# Patient Record
Sex: Female | Born: 1945 | Race: White | Hispanic: No | Marital: Married | Smoking: Never smoker
Health system: Southern US, Community
[De-identification: ages and names within clinical notes are randomized; demographics above are authoritative.]

## PROBLEM LIST (undated history)

## (undated) DIAGNOSIS — F329 Major depressive disorder, single episode, unspecified: Secondary | ICD-10-CM

## (undated) DIAGNOSIS — E039 Hypothyroidism, unspecified: Secondary | ICD-10-CM

## (undated) DIAGNOSIS — K2950 Unspecified chronic gastritis without bleeding: Secondary | ICD-10-CM

## (undated) DIAGNOSIS — M47812 Spondylosis without myelopathy or radiculopathy, cervical region: Secondary | ICD-10-CM

## (undated) DIAGNOSIS — M545 Low back pain: Secondary | ICD-10-CM

## (undated) DIAGNOSIS — H269 Unspecified cataract: Secondary | ICD-10-CM

## (undated) DIAGNOSIS — K5790 Diverticulosis of intestine, part unspecified, without perforation or abscess without bleeding: Secondary | ICD-10-CM

## (undated) DIAGNOSIS — M899 Disorder of bone, unspecified: Secondary | ICD-10-CM

## (undated) DIAGNOSIS — K589 Irritable bowel syndrome without diarrhea: Secondary | ICD-10-CM

## (undated) DIAGNOSIS — M7061 Trochanteric bursitis, right hip: Secondary | ICD-10-CM

## (undated) DIAGNOSIS — E7800 Pure hypercholesterolemia, unspecified: Secondary | ICD-10-CM

## (undated) DIAGNOSIS — R609 Edema, unspecified: Secondary | ICD-10-CM

## (undated) DIAGNOSIS — R600 Localized edema: Secondary | ICD-10-CM

## (undated) DIAGNOSIS — D649 Anemia, unspecified: Secondary | ICD-10-CM

## (undated) DIAGNOSIS — M62838 Other muscle spasm: Secondary | ICD-10-CM

## (undated) DIAGNOSIS — M949 Disorder of cartilage, unspecified: Secondary | ICD-10-CM

## (undated) DIAGNOSIS — T7840XA Allergy, unspecified, initial encounter: Secondary | ICD-10-CM

## (undated) DIAGNOSIS — R011 Cardiac murmur, unspecified: Secondary | ICD-10-CM

## (undated) DIAGNOSIS — N281 Cyst of kidney, acquired: Secondary | ICD-10-CM

## (undated) DIAGNOSIS — Z5189 Encounter for other specified aftercare: Secondary | ICD-10-CM

## (undated) DIAGNOSIS — G4700 Insomnia, unspecified: Secondary | ICD-10-CM

## (undated) DIAGNOSIS — K219 Gastro-esophageal reflux disease without esophagitis: Secondary | ICD-10-CM

## (undated) DIAGNOSIS — G8929 Other chronic pain: Secondary | ICD-10-CM

## (undated) DIAGNOSIS — K449 Diaphragmatic hernia without obstruction or gangrene: Secondary | ICD-10-CM

## (undated) DIAGNOSIS — K7689 Other specified diseases of liver: Secondary | ICD-10-CM

## (undated) DIAGNOSIS — K52832 Lymphocytic colitis: Secondary | ICD-10-CM

## (undated) DIAGNOSIS — M1990 Unspecified osteoarthritis, unspecified site: Secondary | ICD-10-CM

## (undated) DIAGNOSIS — J309 Allergic rhinitis, unspecified: Secondary | ICD-10-CM

## (undated) DIAGNOSIS — N183 Chronic kidney disease, stage 3 unspecified: Secondary | ICD-10-CM

## (undated) DIAGNOSIS — J3489 Other specified disorders of nose and nasal sinuses: Secondary | ICD-10-CM

## (undated) HISTORY — DX: Chronic kidney disease, stage 3 unspecified: N18.30

## (undated) HISTORY — DX: Other chronic pain: G89.29

## (undated) HISTORY — DX: Localized edema: R60.0

## (undated) HISTORY — DX: Encounter for other specified aftercare: Z51.89

## (undated) HISTORY — DX: Unspecified chronic gastritis without bleeding: K29.50

## (undated) HISTORY — DX: Other specified disorders of nose and nasal sinuses: J34.89

## (undated) HISTORY — PX: ESOPHAGOGASTRODUODENOSCOPY: SHX1529

## (undated) HISTORY — DX: Irritable bowel syndrome, unspecified: K58.9

## (undated) HISTORY — DX: Insomnia, unspecified: G47.00

## (undated) HISTORY — DX: Anemia, unspecified: D64.9

## (undated) HISTORY — DX: Major depressive disorder, single episode, unspecified: F32.9

## (undated) HISTORY — DX: Lymphocytic colitis: K52.832

## (undated) HISTORY — DX: Other specified diseases of liver: K76.89

## (undated) HISTORY — PX: ABDOMINAL HYSTERECTOMY: SHX81

## (undated) HISTORY — DX: Pure hypercholesterolemia, unspecified: E78.00

## (undated) HISTORY — PX: COLONOSCOPY: SHX174

## (undated) HISTORY — DX: Hypothyroidism, unspecified: E03.9

## (undated) HISTORY — PX: SHOULDER SURGERY: SHX246

## (undated) HISTORY — DX: Allergic rhinitis, unspecified: J30.9

## (undated) HISTORY — DX: Diaphragmatic hernia without obstruction or gangrene: K44.9

## (undated) HISTORY — DX: Trochanteric bursitis, right hip: M70.61

## (undated) HISTORY — DX: Edema, unspecified: R60.9

## (undated) HISTORY — DX: Diverticulosis of intestine, part unspecified, without perforation or abscess without bleeding: K57.90

## (undated) HISTORY — DX: Unspecified cataract: H26.9

## (undated) HISTORY — DX: Disorder of bone, unspecified: M89.9

## (undated) HISTORY — DX: Cardiac murmur, unspecified: R01.1

## (undated) HISTORY — DX: Cyst of kidney, acquired: N28.1

## (undated) HISTORY — DX: Low back pain: M54.5

## (undated) HISTORY — DX: Other muscle spasm: M62.838

## (undated) HISTORY — DX: Spondylosis without myelopathy or radiculopathy, cervical region: M47.812

## (undated) HISTORY — DX: Disorder of cartilage, unspecified: M94.9

## (undated) HISTORY — DX: Allergy, unspecified, initial encounter: T78.40XA

## (undated) HISTORY — DX: Gastro-esophageal reflux disease without esophagitis: K21.9

## (undated) HISTORY — PX: CATARACT EXTRACTION: SUR2

## (undated) HISTORY — PX: OTHER SURGICAL HISTORY: SHX169

## (undated) HISTORY — PX: BACK SURGERY: SHX140

---

## 1998-08-07 ENCOUNTER — Encounter: Admission: RE | Admit: 1998-08-07 | Discharge: 1998-11-05 | Payer: Self-pay | Admitting: Specialist

## 1998-09-25 ENCOUNTER — Encounter
Admission: RE | Admit: 1998-09-25 | Discharge: 1998-12-24 | Payer: Self-pay | Admitting: Physical Medicine and Rehabilitation

## 1998-10-29 ENCOUNTER — Other Ambulatory Visit: Admission: RE | Admit: 1998-10-29 | Discharge: 1998-10-29 | Payer: Self-pay | Admitting: *Deleted

## 2000-01-27 ENCOUNTER — Emergency Department (HOSPITAL_COMMUNITY): Admission: EM | Admit: 2000-01-27 | Discharge: 2000-01-27 | Payer: Self-pay | Admitting: Emergency Medicine

## 2001-12-15 ENCOUNTER — Encounter: Admission: RE | Admit: 2001-12-15 | Discharge: 2001-12-15 | Payer: Self-pay | Admitting: *Deleted

## 2002-08-01 ENCOUNTER — Encounter: Payer: Self-pay | Admitting: Family Medicine

## 2002-08-01 ENCOUNTER — Encounter: Admission: RE | Admit: 2002-08-01 | Discharge: 2002-08-01 | Payer: Self-pay | Admitting: Family Medicine

## 2003-07-26 ENCOUNTER — Encounter: Payer: Self-pay | Admitting: Specialist

## 2003-07-26 ENCOUNTER — Encounter: Admission: RE | Admit: 2003-07-26 | Discharge: 2003-07-26 | Payer: Self-pay | Admitting: Specialist

## 2003-08-24 ENCOUNTER — Encounter: Admission: RE | Admit: 2003-08-24 | Discharge: 2003-08-24 | Payer: Self-pay | Admitting: Specialist

## 2003-08-24 ENCOUNTER — Encounter: Payer: Self-pay | Admitting: Specialist

## 2003-10-12 ENCOUNTER — Encounter: Payer: Self-pay | Admitting: Specialist

## 2003-10-18 ENCOUNTER — Inpatient Hospital Stay (HOSPITAL_COMMUNITY): Admission: RE | Admit: 2003-10-18 | Discharge: 2003-10-22 | Payer: Self-pay | Admitting: Specialist

## 2003-10-18 ENCOUNTER — Encounter: Payer: Self-pay | Admitting: Specialist

## 2005-10-13 ENCOUNTER — Encounter: Admission: RE | Admit: 2005-10-13 | Discharge: 2005-10-13 | Payer: Self-pay | Admitting: Family Medicine

## 2007-04-24 ENCOUNTER — Observation Stay (HOSPITAL_COMMUNITY): Admission: EM | Admit: 2007-04-24 | Discharge: 2007-04-26 | Payer: Self-pay | Admitting: Emergency Medicine

## 2007-04-25 ENCOUNTER — Ambulatory Visit: Payer: Self-pay | Admitting: Internal Medicine

## 2007-04-26 ENCOUNTER — Encounter: Payer: Self-pay | Admitting: Cardiology

## 2007-04-26 ENCOUNTER — Ambulatory Visit: Payer: Self-pay | Admitting: Cardiology

## 2007-05-26 ENCOUNTER — Ambulatory Visit: Payer: Self-pay | Admitting: Internal Medicine

## 2007-06-02 ENCOUNTER — Encounter: Payer: Self-pay | Admitting: Internal Medicine

## 2007-06-02 DIAGNOSIS — E039 Hypothyroidism, unspecified: Secondary | ICD-10-CM

## 2007-06-02 DIAGNOSIS — G4700 Insomnia, unspecified: Secondary | ICD-10-CM | POA: Insufficient documentation

## 2007-06-02 DIAGNOSIS — J309 Allergic rhinitis, unspecified: Secondary | ICD-10-CM | POA: Insufficient documentation

## 2007-06-02 DIAGNOSIS — K219 Gastro-esophageal reflux disease without esophagitis: Secondary | ICD-10-CM

## 2007-06-02 DIAGNOSIS — F329 Major depressive disorder, single episode, unspecified: Secondary | ICD-10-CM

## 2007-06-02 DIAGNOSIS — E049 Nontoxic goiter, unspecified: Secondary | ICD-10-CM | POA: Insufficient documentation

## 2007-06-02 DIAGNOSIS — F3289 Other specified depressive episodes: Secondary | ICD-10-CM

## 2007-06-02 DIAGNOSIS — I10 Essential (primary) hypertension: Secondary | ICD-10-CM | POA: Insufficient documentation

## 2007-06-02 HISTORY — DX: Other specified depressive episodes: F32.89

## 2007-06-02 HISTORY — DX: Major depressive disorder, single episode, unspecified: F32.9

## 2007-06-02 HISTORY — DX: Insomnia, unspecified: G47.00

## 2007-06-02 HISTORY — DX: Hypothyroidism, unspecified: E03.9

## 2007-06-02 HISTORY — DX: Allergic rhinitis, unspecified: J30.9

## 2007-07-11 ENCOUNTER — Ambulatory Visit: Payer: Self-pay | Admitting: Internal Medicine

## 2007-07-12 ENCOUNTER — Ambulatory Visit: Payer: Self-pay | Admitting: Gastroenterology

## 2007-07-18 ENCOUNTER — Telehealth: Payer: Self-pay | Admitting: Internal Medicine

## 2007-07-18 ENCOUNTER — Telehealth (INDEPENDENT_AMBULATORY_CARE_PROVIDER_SITE_OTHER): Payer: Self-pay | Admitting: *Deleted

## 2007-07-26 ENCOUNTER — Ambulatory Visit: Payer: Self-pay | Admitting: Gastroenterology

## 2007-07-26 ENCOUNTER — Encounter: Payer: Self-pay | Admitting: Internal Medicine

## 2007-08-18 ENCOUNTER — Encounter: Payer: Self-pay | Admitting: Internal Medicine

## 2007-09-05 ENCOUNTER — Ambulatory Visit: Payer: Self-pay | Admitting: Internal Medicine

## 2007-09-05 DIAGNOSIS — R109 Unspecified abdominal pain: Secondary | ICD-10-CM | POA: Insufficient documentation

## 2007-10-06 ENCOUNTER — Ambulatory Visit: Payer: Self-pay | Admitting: Internal Medicine

## 2007-10-07 ENCOUNTER — Telehealth (INDEPENDENT_AMBULATORY_CARE_PROVIDER_SITE_OTHER): Payer: Self-pay

## 2007-10-07 ENCOUNTER — Encounter: Payer: Self-pay | Admitting: Internal Medicine

## 2007-10-10 ENCOUNTER — Ambulatory Visit: Payer: Self-pay | Admitting: Internal Medicine

## 2007-10-10 DIAGNOSIS — T7841XA Arthus phenomenon, initial encounter: Secondary | ICD-10-CM

## 2007-10-10 LAB — CONVERTED CEMR LAB
BUN: 11 mg/dL (ref 6–23)
Creatinine, Ser: 1 mg/dL (ref 0.4–1.2)

## 2007-10-11 ENCOUNTER — Ambulatory Visit: Payer: Self-pay | Admitting: Cardiology

## 2007-10-13 ENCOUNTER — Telehealth (INDEPENDENT_AMBULATORY_CARE_PROVIDER_SITE_OTHER): Payer: Self-pay | Admitting: *Deleted

## 2007-10-26 ENCOUNTER — Ambulatory Visit: Payer: Self-pay | Admitting: Gastroenterology

## 2007-10-27 LAB — CONVERTED CEMR LAB
ALT: 18 units/L (ref 0–35)
Albumin: 4.8 g/dL (ref 3.5–5.2)
Basophils Relative: 1 % (ref 0.0–1.0)
CO2: 29 meq/L (ref 19–32)
Chloride: 101 meq/L (ref 96–112)
GFR calc Af Amer: 72 mL/min
Glucose, Bld: 106 mg/dL — ABNORMAL HIGH (ref 70–99)
HCT: 39.4 % (ref 36.0–46.0)
Lymphocytes Relative: 37.7 % (ref 12.0–46.0)
MCHC: 35.5 g/dL (ref 30.0–36.0)
Monocytes Absolute: 1 10*3/uL — ABNORMAL HIGH (ref 0.2–0.7)
Platelets: 283 10*3/uL (ref 150–400)
Potassium: 3.8 meq/L (ref 3.5–5.1)
RDW: 12.6 % (ref 11.5–14.6)
TSH: 6.51 microintl units/mL — ABNORMAL HIGH (ref 0.35–5.50)
WBC: 7.4 10*3/uL (ref 4.5–10.5)

## 2007-10-28 ENCOUNTER — Ambulatory Visit: Payer: Self-pay | Admitting: Cardiology

## 2007-10-31 ENCOUNTER — Encounter: Admission: RE | Admit: 2007-10-31 | Discharge: 2007-10-31 | Payer: Self-pay | Admitting: Gastroenterology

## 2007-11-01 ENCOUNTER — Encounter: Payer: Self-pay | Admitting: Internal Medicine

## 2007-11-01 ENCOUNTER — Ambulatory Visit: Payer: Self-pay | Admitting: Gastroenterology

## 2007-12-07 ENCOUNTER — Ambulatory Visit: Payer: Self-pay | Admitting: Gastroenterology

## 2007-12-19 ENCOUNTER — Telehealth: Payer: Self-pay | Admitting: Internal Medicine

## 2008-01-05 ENCOUNTER — Ambulatory Visit: Payer: Self-pay | Admitting: Internal Medicine

## 2008-01-05 LAB — CONVERTED CEMR LAB: TSH: 1.6 microintl units/mL (ref 0.35–5.50)

## 2008-02-03 ENCOUNTER — Encounter: Payer: Self-pay | Admitting: Internal Medicine

## 2008-03-01 ENCOUNTER — Encounter (INDEPENDENT_AMBULATORY_CARE_PROVIDER_SITE_OTHER): Payer: Self-pay | Admitting: Surgery

## 2008-03-01 ENCOUNTER — Ambulatory Visit (HOSPITAL_COMMUNITY): Admission: RE | Admit: 2008-03-01 | Discharge: 2008-03-02 | Payer: Self-pay | Admitting: Surgery

## 2008-03-23 ENCOUNTER — Encounter: Payer: Self-pay | Admitting: Internal Medicine

## 2008-08-27 ENCOUNTER — Ambulatory Visit: Payer: Self-pay | Admitting: Internal Medicine

## 2008-08-27 LAB — CONVERTED CEMR LAB: TSH: 1.48 microintl units/mL (ref 0.35–5.50)

## 2008-10-23 ENCOUNTER — Ambulatory Visit: Payer: Self-pay | Admitting: Internal Medicine

## 2008-12-23 ENCOUNTER — Encounter: Admission: RE | Admit: 2008-12-23 | Discharge: 2008-12-23 | Payer: Self-pay | Admitting: Specialist

## 2009-02-18 ENCOUNTER — Ambulatory Visit: Payer: Self-pay | Admitting: Internal Medicine

## 2009-02-18 DIAGNOSIS — M949 Disorder of cartilage, unspecified: Secondary | ICD-10-CM

## 2009-02-18 DIAGNOSIS — M9390 Osteochondropathy, unspecified of unspecified site: Secondary | ICD-10-CM | POA: Insufficient documentation

## 2009-02-18 DIAGNOSIS — M899 Disorder of bone, unspecified: Secondary | ICD-10-CM

## 2009-02-18 DIAGNOSIS — M545 Low back pain, unspecified: Secondary | ICD-10-CM | POA: Insufficient documentation

## 2009-02-18 HISTORY — DX: Disorder of bone, unspecified: M89.9

## 2009-02-18 HISTORY — DX: Low back pain, unspecified: M54.50

## 2009-03-04 ENCOUNTER — Telehealth: Payer: Self-pay | Admitting: Internal Medicine

## 2009-04-01 ENCOUNTER — Telehealth: Payer: Self-pay | Admitting: Internal Medicine

## 2009-05-07 ENCOUNTER — Encounter: Admission: RE | Admit: 2009-05-07 | Discharge: 2009-05-07 | Payer: Self-pay | Admitting: Specialist

## 2009-05-30 ENCOUNTER — Ambulatory Visit (HOSPITAL_COMMUNITY): Admission: RE | Admit: 2009-05-30 | Discharge: 2009-05-31 | Payer: Self-pay | Admitting: Specialist

## 2009-06-21 ENCOUNTER — Ambulatory Visit: Payer: Self-pay | Admitting: Internal Medicine

## 2009-10-09 ENCOUNTER — Encounter: Admission: RE | Admit: 2009-10-09 | Discharge: 2009-10-09 | Payer: Self-pay | Admitting: Specialist

## 2009-10-22 ENCOUNTER — Ambulatory Visit: Payer: Self-pay | Admitting: Internal Medicine

## 2009-11-12 ENCOUNTER — Encounter (INDEPENDENT_AMBULATORY_CARE_PROVIDER_SITE_OTHER): Payer: Self-pay | Admitting: *Deleted

## 2009-12-06 ENCOUNTER — Inpatient Hospital Stay (HOSPITAL_COMMUNITY): Admission: RE | Admit: 2009-12-06 | Discharge: 2009-12-08 | Payer: Self-pay | Admitting: Orthopedic Surgery

## 2010-02-18 ENCOUNTER — Ambulatory Visit: Payer: Self-pay | Admitting: Internal Medicine

## 2010-04-14 ENCOUNTER — Ambulatory Visit: Payer: Self-pay | Admitting: Internal Medicine

## 2010-04-17 ENCOUNTER — Encounter: Payer: Self-pay | Admitting: Internal Medicine

## 2010-08-25 ENCOUNTER — Telehealth: Payer: Self-pay | Admitting: Internal Medicine

## 2010-09-10 ENCOUNTER — Telehealth: Payer: Self-pay | Admitting: Internal Medicine

## 2010-09-23 ENCOUNTER — Encounter: Payer: Self-pay | Admitting: Internal Medicine

## 2010-10-14 ENCOUNTER — Ambulatory Visit: Payer: Self-pay | Admitting: Internal Medicine

## 2010-11-14 ENCOUNTER — Ambulatory Visit: Payer: Self-pay | Admitting: Internal Medicine

## 2010-11-18 ENCOUNTER — Encounter: Payer: Self-pay | Admitting: Internal Medicine

## 2010-12-08 ENCOUNTER — Telehealth: Payer: Self-pay | Admitting: Internal Medicine

## 2010-12-28 HISTORY — PX: CHOLECYSTECTOMY: SHX55

## 2011-01-06 ENCOUNTER — Telehealth: Payer: Self-pay

## 2011-01-18 ENCOUNTER — Encounter: Payer: Self-pay | Admitting: Internal Medicine

## 2011-01-25 LAB — CONVERTED CEMR LAB
AST: 20 units/L (ref 0–37)
Albumin: 4.1 g/dL (ref 3.5–5.2)
Alkaline Phosphatase: 105 units/L (ref 39–117)
Alkaline Phosphatase: 78 units/L (ref 39–117)
BUN: 13 mg/dL (ref 6–23)
BUN: 15 mg/dL (ref 6–23)
Basophils Absolute: 0 10*3/uL (ref 0.0–0.1)
Basophils Relative: 0.6 % (ref 0.0–3.0)
Bilirubin, Direct: 0 mg/dL (ref 0.0–0.3)
CO2: 30 meq/L (ref 19–32)
Calcium: 9.5 mg/dL (ref 8.4–10.5)
Calcium: 9.8 mg/dL (ref 8.4–10.5)
Creatinine, Ser: 1 mg/dL (ref 0.4–1.2)
Creatinine, Ser: 1.1 mg/dL (ref 0.4–1.2)
Direct LDL: 154.1 mg/dL
Eosinophils Absolute: 0.2 10*3/uL (ref 0.0–0.7)
Eosinophils Relative: 6.4 % — ABNORMAL HIGH (ref 0.0–5.0)
GFR calc non Af Amer: 53 mL/min
GFR calc non Af Amer: 59.16 mL/min (ref >60)
Glucose, Bld: 87 mg/dL (ref 70–99)
HCT: 33.9 % — ABNORMAL LOW (ref 36.0–46.0)
HCT: 35.7 % — ABNORMAL LOW (ref 36.0–46.0)
HDL: 44.8 mg/dL (ref >39.0)
HDL: 62.8 mg/dL (ref >39.00)
Hemoglobin: 11.9 g/dL — ABNORMAL LOW (ref 12.0–15.0)
Hemoglobin: 12.5 g/dL (ref 12.0–15.0)
Lymphocytes Relative: 36.2 % (ref 12.0–46.0)
MCHC: 34.9 g/dL (ref 30.0–36.0)
MCV: 94.1 fL (ref 78.0–100.0)
Monocytes Relative: 13.8 % — ABNORMAL HIGH (ref 3.0–12.0)
Monocytes Relative: 14.4 % — ABNORMAL HIGH (ref 3.0–12.0)
Neutrophils Relative %: 38.1 % — ABNORMAL LOW (ref 43.0–77.0)
Neutrophils Relative %: 46.4 % (ref 43.0–77.0)
Platelets: 289 10*3/uL (ref 150–400)
RBC: 3.58 M/uL — ABNORMAL LOW (ref 3.87–5.11)
RBC: 3.8 M/uL — ABNORMAL LOW (ref 3.87–5.11)
RDW: 12.4 % (ref 11.5–14.6)
Total CHOL/HDL Ratio: 3
Total CHOL/HDL Ratio: 5
Total Protein: 7.5 g/dL (ref 6.0–8.3)
Triglycerides: 115 mg/dL (ref 0.0–149.0)
Triglycerides: 151 mg/dL — ABNORMAL HIGH (ref 0–149)
VLDL: 30 mg/dL (ref 0–40)
WBC: 6.1 10*3/uL (ref 4.5–10.5)

## 2011-01-26 ENCOUNTER — Other Ambulatory Visit (HOSPITAL_COMMUNITY)
Admission: RE | Admit: 2011-01-26 | Discharge: 2011-01-26 | Disposition: A | Discharge status: Home / Self Care | Payer: PRIVATE HEALTH INSURANCE | Source: Ambulatory Visit | Attending: Otolaryngology | Admitting: Otolaryngology

## 2011-01-26 DIAGNOSIS — R220 Localized swelling, mass and lump, head: Secondary | ICD-10-CM | POA: Insufficient documentation

## 2011-01-27 NOTE — Assessment & Plan Note (Signed)
Summary: ROV/MM   Vital Signs:  Patient profile:   65 year old female Weight:      178 pounds BP sitting:   130 / 90  (right arm) Cuff size:   regular  Vitals Entered By: Duard Brady LPN (November 14, 2010 3:17 PM) CC: rov - doing well  Is Patient Diabetic? No   CC:  rov - doing well .  History of Present Illness: 65 year old patient who is seen today for follow up of her depression.  She was placed on sertraline one month ago and has improved modestly.  She is also on alprazolam p.r.n.  She has a history of treated hypertension and hypothyroidism.  She has osteoarthritis and chronic low back pain.  Allergies (verified): No Known Drug Allergies  Past History:  Past Medical History: Reviewed history from 02/18/2009 and no changes required. Depression GERD Hypertension Hypothyroidism,  goiter insomnia Allergic rhinitis cholelithiasis Osteopenia Low back pain  Past Surgical History: Reviewed history from 04/14/2010 and no changes required. Hysterectomy Eye surgery Back surgeries times 4  s/ lap chole '09 s/p EGD and colonoscopy '08 right shoulder surgery 12/ 2010 [facility])  Family History: Reviewed history from 04/14/2010 and no changes required. father deceased  unclear causes mother died age 56  probable CAD  one brother history lung cancer   Two sisters positive for  hypertension, thyroid disorder and diabetes  Social History: Reviewed history from 04/14/2010 and no changes required. Married Never Smoked Regular exercise-no 2 sons   Review of Systems       The patient complains of depression.  The patient denies anorexia, fever, weight loss, weight gain, vision loss, decreased hearing, hoarseness, chest pain, syncope, dyspnea on exertion, peripheral edema, prolonged cough, headaches, hemoptysis, abdominal pain, melena, hematochezia, severe indigestion/heartburn, hematuria, incontinence, genital sores, muscle weakness, suspicious skin lesions,  transient blindness, difficulty walking, unusual weight change, abnormal bleeding, enlarged lymph nodes, angioedema, and breast masses.    Physical Exam  General:  overweight-appearing.  overweight-appearing.   Head:  Normocephalic and atraumatic without obvious abnormalities. No apparent alopecia or balding. Eyes:  No corneal or conjunctival inflammation noted. EOMI. Perrla. Funduscopic exam benign, without hemorrhages, exudates or papilledema. Vision grossly normal. Mouth:  Oral mucosa and oropharynx without lesions or exudates.  Teeth in good repair. Neck:  No deformities, masses, or tenderness noted. Lungs:  Normal respiratory effort, chest expands symmetrically. Lungs are clear to auscultation, no crackles or wheezes. Heart:  Normal rate and regular rhythm. S1 and S2 normal without gallop, murmur, click, rub or other extra sounds. Psych:  Cognition and judgment appear intact. Alert and cooperative with normal attention span and concentration. No apparent delusions, illusions, hallucinations   Impression & Recommendations:  Problem # 1:  LOW BACK PAIN (ICD-724.2)  Her updated medication list for this problem includes:    Methocarbamol 500 Mg Tabs (Methocarbamol) .Marland Kitchen... 1 three times a day    Vicodin 5-500 Mg Tabs (Hydrocodone-acetaminophen) .Marland Kitchen... 1 q4h as needed    Adult Aspirin Ec Low Strength 81 Mg Tbec (Aspirin) .Marland Kitchen... 1 once daily    Meloxicam 15 Mg Tabs (Meloxicam) .Marland Kitchen... 1/2 - 1 once daily  Her updated medication list for this problem includes:    Methocarbamol 500 Mg Tabs (Methocarbamol) .Marland Kitchen... 1 three times a day    Vicodin 5-500 Mg Tabs (Hydrocodone-acetaminophen) .Marland Kitchen... 1 q4h as needed    Adult Aspirin Ec Low Strength 81 Mg Tbec (Aspirin) .Marland Kitchen... 1 once daily    Meloxicam 15 Mg Tabs (Meloxicam) .Marland Kitchen... 1/2 -  1 once daily  Problem # 2:  DEPRESSION (ICD-311)  Her updated medication list for this problem includes:    Alprazolam 2 Mg Tabs (Alprazolam) .Marland Kitchen... 1 three times a day     Sertraline Hcl 50 Mg Tabs (Sertraline hcl) ..... One every morning  Her updated medication list for this problem includes:    Alprazolam 2 Mg Tabs (Alprazolam) .Marland Kitchen... 1 three times a day    Sertraline Hcl 50 Mg Tabs (Sertraline hcl) ..... One every morning  Complete Medication List: 1)  Alprazolam 2 Mg Tabs (Alprazolam) .Marland Kitchen.. 1 three times a day 2)  Furosemide 40 Mg Tabs (Furosemide) .... 2 once a day 3)  Methocarbamol 500 Mg Tabs (Methocarbamol) .Marland Kitchen.. 1 three times a day 4)  Vicodin 5-500 Mg Tabs (Hydrocodone-acetaminophen) .Marland Kitchen.. 1 q4h as needed 5)  Synthroid 75 Mcg Tabs (Levothyroxine sodium) .Marland Kitchen.. 1 once daily 6)  Adult Aspirin Ec Low Strength 81 Mg Tbec (Aspirin) .Marland Kitchen.. 1 once daily 7)  Meloxicam 15 Mg Tabs (Meloxicam) .... 1/2 - 1 once daily 8)  Sertraline Hcl 50 Mg Tabs (Sertraline hcl) .... One every morning  Patient Instructions: 1)  Please schedule a follow-up appointment in 3 months. 2)  Limit your Sodium (Salt). 3)  It is important that you exercise regularly at least 20 minutes 5 times a week. If you develop chest pain, have severe difficulty breathing, or feel very tired , stop exercising immediately and seek medical attention. 4)  You need to lose weight. Consider a lower calorie diet and regular exercise.  Prescriptions: SERTRALINE HCL 50 MG TABS (SERTRALINE HCL) one every morning  #90 x 2   Entered and Authorized by:   Gordy Savers  MD   Signed by:   Gordy Savers  MD on 11/14/2010   Method used:   Print then Give to Patient   RxID:   [id number] MELOXICAM 15 MG TABS (MELOXICAM) 1/2 - 1 once daily  #90 x 3   Entered and Authorized by:   Gordy Savers  MD   Signed by:   Gordy Savers  MD on 11/14/2010   Method used:   Print then Give to Patient   RxID:   [id number] SYNTHROID 75 MCG  TABS (LEVOTHYROXINE SODIUM) 1 once daily  #90 x 5   Entered and Authorized by:   Gordy Savers  MD   Signed by:   Gordy Savers  MD on 11/14/2010    Method used:   Print then Give to Patient   RxID:   [id number] VICODIN 5-500 MG  TABS (HYDROCODONE-ACETAMINOPHEN) 1 q4h as needed  #90 x 2   Entered and Authorized by:   Gordy Savers  MD   Signed by:   Gordy Savers  MD on 11/14/2010   Method used:   Print then Give to Patient   RxID:   [id number] FUROSEMIDE 40 MG TABS (FUROSEMIDE) 2 once a day  #180 x 3   Entered and Authorized by:   Gordy Savers  MD   Signed by:   Gordy Savers  MD on 11/14/2010   Method used:   Print then Give to Patient   RxID:   [id number] ALPRAZOLAM 2 MG TABS (ALPRAZOLAM) 1 three times a day  #90 x 4   Entered and Authorized by:   Gordy Savers  MD   Signed by:   Gordy Savers  MD on 11/14/2010   Method used:   Print then Give  to Patient   RxID:   [id number]    Orders Added: 1)  Est. Patient Level III [14782]

## 2011-01-27 NOTE — Progress Notes (Signed)
Summary: refill Vicodin   Phone Note Refill Request   Refills Requested: Medication #1:  VICODIN 5-500 MG  TABS 1 q4h as needed   Dosage confirmed as above?Dosage Confirmed Initial call taken by: Kathrynn Speed CMA,  August 25, 2010 3:47 PM Summary of Call: Refill for Vicodin 5-500mg  Last refill was 05/08/10 pharmacy. Last office visit 04/14/10.  Ok to refill? Initial call taken by: Kathrynn Speed CMA,  August 25, 2010 11:49 AM    Prescriptions: VICODIN 5-500 MG  TABS (HYDROCODONE-ACETAMINOPHEN) 1 q4h as needed  #90 x 0   Entered by:   Kathrynn Speed CMA   Authorized by:   Gordy Savers  MD   Signed by:   Kathrynn Speed CMA on 08/25/2010   Method used:   Historical   RxID:   [id number]  #90

## 2011-01-27 NOTE — Letter (Signed)
Summary: [facility]  [facility]   Imported By: Maryln Gottron 09/30/2010 15:25:28  _____________________________________________________________________  External Attachment:    Type:   Image     Comment:   External Document

## 2011-01-27 NOTE — Assessment & Plan Note (Signed)
Summary: 6 month fup//ccm   Vital Signs:  Patient profile:   65 year old female Weight:      182 pounds Temp:     98.2 degrees F oral BP sitting:   110 / 76  (right arm) Cuff size:   regular  Vitals Entered By: Duard Brady LPN (October 14, 2010 3:12 PM) CC: 6 mos rov - doing well  Is Patient Diabetic? No Flu Vaccine Consent Questions     Do you have a history of severe allergic reactions to this vaccine? no    Any prior history of allergic reactions to egg and/or gelatin? no    Do you have a sensitivity to the preservative Thimersol? no    Do you have a past history of Guillan-Barre Syndrome? no    Do you currently have an acute febrile illness? no    Have you ever had a severe reaction to latex? no    Vaccine information given and explained to patient? yes    Are you currently pregnant? no    Lot Number:[id number]   Exp Date:06/27/2011   Site Given  Left Deltoid IM   CC:  6 mos rov - doing well .  History of Present Illness: 65 year old patient who is seen today for follow-up.  She has a history of hypertension, depression, hypothyroidism.  She is accompanied by her husband today.  She is having worsening depression of several weeks duration, as well as insomnia.  She complains of weakness and very little interest in daily activities.  She has treated hypertension, which has been stable and also a history of gastroesophageal reflux disease.  She has low back pain that has also been stable  Allergies (verified): No Known Drug Allergies  Past History:  Past Medical History: Reviewed history from 02/18/2009 and no changes required. Depression GERD Hypertension Hypothyroidism,  goiter insomnia Allergic rhinitis cholelithiasis Osteopenia Low back pain  Past Surgical History: Reviewed history from 04/14/2010 and no changes required. Hysterectomy Eye surgery Back surgeries times 4  s/ lap chole '09 s/p EGD and colonoscopy '08 right shoulder surgery 12/ 2010  ([place])  Review of Systems       The patient complains of muscle weakness and depression.  The patient denies anorexia, fever, weight loss, weight gain, vision loss, decreased hearing, hoarseness, chest pain, syncope, dyspnea on exertion, peripheral edema, prolonged cough, headaches, hemoptysis, abdominal pain, melena, hematochezia, severe indigestion/heartburn, hematuria, incontinence, genital sores, suspicious skin lesions, transient blindness, difficulty walking, unusual weight change, abnormal bleeding, enlarged lymph nodes, angioedema, and breast masses.    Physical Exam  General:  overweight-appearing.  depressed and tearful.  Blood pressure 110/76 Head:  Normocephalic and atraumatic without obvious abnormalities. No apparent alopecia or balding. Mouth:  Oral mucosa and oropharynx without lesions or exudates.  Teeth in good repair. Neck:  No deformities, masses, or tenderness noted. Lungs:  Normal respiratory effort, chest expands symmetrically. Lungs are clear to auscultation, no crackles or wheezes. Heart:  Normal rate and regular rhythm. S1 and S2 normal without gallop, murmur, click, rub or other extra sounds. Abdomen:  Bowel sounds positive,abdomen soft and non-tender without masses, organomegaly or hernias noted. Msk:  No deformity or scoliosis noted of thoracic or lumbar spine.   Pulses:  R and L carotid,radial,femoral,dorsalis pedis and posterior tibial pulses are full and equal bilaterally Extremities:  No clubbing, cyanosis, edema, or deformity noted with normal full range of motion of all joints.   Psych:  tearful and depressed   Impression & Recommendations:  Problem # 1:  INSOMNIA (ICD-780.52)  Problem # 2:  HYPERTENSION (ICD-401.9)  Her updated medication list for this problem includes:    Furosemide 40 Mg Tabs (Furosemide) .Marland Kitchen... 2 once a day  Problem # 3:  DEPRESSION (ICD-311)  Her updated medication list for this problem includes:    Alprazolam 2 Mg Tabs  (Alprazolam) .Marland Kitchen... 1 three times a day    Sertraline Hcl 50 Mg Tabs (Sertraline hcl) ..... One every morning  Complete Medication List: 1)  Alprazolam 2 Mg Tabs (Alprazolam) .Marland Kitchen.. 1 three times a day 2)  Furosemide 40 Mg Tabs (Furosemide) .... 2 once a day 3)  Methocarbamol 500 Mg Tabs (Methocarbamol) .Marland Kitchen.. 1 three times a day 4)  Vicodin 5-500 Mg Tabs (Hydrocodone-acetaminophen) .Marland Kitchen.. 1 q4h as needed 5)  Synthroid 75 Mcg Tabs (Levothyroxine sodium) .Marland Kitchen.. 1 once daily 6)  Adult Aspirin Ec Low Strength 81 Mg Tbec (Aspirin) .Marland Kitchen.. 1 once daily 7)  Meloxicam 15 Mg Tabs (Meloxicam) .... 1/2 - 1 once daily 8)  Sertraline Hcl 50 Mg Tabs (Sertraline hcl) .... One every morning  Other Orders: Admin 1st Vaccine (16109) Flu Vaccine 49yrs + (60454)  Patient Instructions: 1)  Please schedule a follow-up appointment in 1 month. 2)  Limit your Sodium (Salt). 3)  It is important that you exercise regularly at least 20 minutes 5 times a week. If you develop chest pain, have severe difficulty breathing, or feel very tired , stop exercising immediately and seek medical attention. Prescriptions: SERTRALINE HCL 50 MG TABS (SERTRALINE HCL) one every morning  #90 x 2   Entered and Authorized by:   Gordy Savers  MD   Signed by:   Gordy Savers  MD on 10/14/2010   Method used:   Electronically to        [facility] [phone]* (retail)       [address]       [place], [place]  [address]       Ph: [phone]       Fax: [phone]   RxID:   [id number]    Orders Added: 1)  Admin 1st Vaccine [90471] 2)  Flu Vaccine 72yrs + [36644] 3)  Est. Patient Level IV [03474]

## 2011-01-27 NOTE — Progress Notes (Signed)
Summary: refill alprazolam  Phone Note Refill Request Message from:  Fax from Pharmacy on September 10, 2010 9:46 AM  Refills Requested: Medication #1:  ALPRAZOLAM 2 MG TABS 1 three times a day   [facility]: [facility]/2010 [name]   [phone]   Method Requested: Fax to Local Pharmacy Initial call taken by: Duard Brady LPN,  September 10, 2010 9:46 AM    Prescriptions: ALPRAZOLAM 2 MG TABS (ALPRAZOLAM) 1 three times a day  #90 x 4   Entered by:   Duard Brady LPN   Authorized by:   Gordy Savers  MD   Signed by:   Duard Brady LPN on 11/91/4782   Method used:   Historical   RxID:   [id number]

## 2011-01-27 NOTE — Assessment & Plan Note (Signed)
Summary: 3 month rov/njr   Vital Signs:  Patient profile:   65 year old female Weight:      189 pounds Temp:     98.0 degrees F oral BP sitting:   130 / 80  (right arm) Cuff size:   regular  Vitals Entered By: Duard Brady LPN (February 18, 2010 3:20 PM) CC: 3 mos rov - doing well Is Patient Diabetic? No   CC:  3 mos rov - doing well.  History of Present Illness: 65 year old patient who is seen today for follow-up of hypertension, allergic rhinitis, hypothyroidism, and history depression.  She is doing quite well status post redo right shoulder surgery on December 06, 2009.  No concerns or complaints  Preventive Screening-Counseling & Management  Alcohol-Tobacco     Smoking Status: never  Allergies (verified): No Known Drug Allergies  Past History:  Past Medical History: Reviewed history from 02/18/2009 and no changes required. Depression GERD Hypertension Hypothyroidism,  goiter insomnia Allergic rhinitis cholelithiasis Osteopenia Low back pain  Physical Exam  General:  Well-developed,well-nourished,in no acute distress; alert,appropriate and cooperative throughout examination Head:  Normocephalic and atraumatic without obvious abnormalities. No apparent alopecia or balding. Mouth:  Oral mucosa and oropharynx without lesions or exudates.  Teeth in good repair. Neck:  No deformities, masses, or tenderness noted. Lungs:  Normal respiratory effort, chest expands symmetrically. Lungs are clear to auscultation, no crackles or wheezes. Heart:  Normal rate and regular rhythm. S1 and S2 normal without gallop, murmur, click, rub or other extra sounds. Abdomen:  Bowel sounds positive,abdomen soft and non-tender without masses, organomegaly or hernias noted. Msk:  No deformity or scoliosis noted of thoracic or lumbar spine.     Impression & Recommendations:  Problem # 1:  HYPOTHYROIDISM (ICD-244.9)  Her updated medication list for this problem includes:  Synthroid 75 Mcg Tabs (Levothyroxine sodium) .Marland Kitchen... 1 once daily  Her updated medication list for this problem includes:    Synthroid 75 Mcg Tabs (Levothyroxine sodium) .Marland Kitchen... 1 once daily  Problem # 2:  HYPERTENSION (ICD-401.9)  Her updated medication list for this problem includes:    Furosemide 40 Mg Tabs (Furosemide) .Marland Kitchen... 2 once a day  Her updated medication list for this problem includes:    Furosemide 40 Mg Tabs (Furosemide) .Marland Kitchen... 2 once a day  Complete Medication List: 1)  Alprazolam 2 Mg Tabs (Alprazolam) .Marland Kitchen.. 1 three times a day 2)  Furosemide 40 Mg Tabs (Furosemide) .... 2 once a day 3)  Methocarbamol 500 Mg Tabs (Methocarbamol) .Marland Kitchen.. 1 three times a day 4)  Vicodin 5-500 Mg Tabs (Hydrocodone-acetaminophen) .Marland Kitchen.. 1 q4h as needed 5)  Synthroid 75 Mcg Tabs (Levothyroxine sodium) .Marland Kitchen.. 1 once daily 6)  Duratuss Ac 12 15-12.5-15 Mg/27ml Susp (Pe hcl-diphenhyd hcl-dm hbrtan) .... 2 tsp q 12 hr 7)  Adult Aspirin Ec Low Strength 81 Mg Tbec (Aspirin) .Marland Kitchen.. 1 once daily 8)  Meloxicam 15 Mg Tabs (Meloxicam) .... 1/2 - 1 once daily  Patient Instructions: 1)  Please schedule a follow-up appointment in 3 months for CPX  2)  Limit your Sodium (Salt) to less than 2 grams a day(slightly less than 1/2 a teaspoon) to prevent fluid retention, swelling, or worsening of symptoms. 3)  It is important that you exercise regularly at least 20 minutes 5 times a week. If you develop chest pain, have severe difficulty breathing, or feel very tired , stop exercising immediately and seek medical attention. 4)  You need to lose weight. Consider a lower calorie diet  and regular exercise.  Prescriptions: MELOXICAM 15 MG TABS (MELOXICAM) 1/2 - 1 once daily  #90 x 3   Entered and Authorized by:   Gordy Savers  MD   Signed by:   Gordy Savers  MD on 02/18/2010   Method used:   Print then Give to Patient   RxID:   [id number] SYNTHROID 75 MCG  TABS (LEVOTHYROXINE SODIUM) 1 once daily  #90 x 3    Entered and Authorized by:   Gordy Savers  MD   Signed by:   Gordy Savers  MD on 02/18/2010   Method used:   Print then Give to Patient   RxID:   [id number] VICODIN 5-500 MG  TABS (HYDROCODONE-ACETAMINOPHEN) 1 q4h as needed  #90 x 2   Entered and Authorized by:   Gordy Savers  MD   Signed by:   Gordy Savers  MD on 02/18/2010   Method used:   Print then Give to Patient   RxID:   [id number] METHOCARBAMOL 500 MG TABS (METHOCARBAMOL) 1 three times a day  #90 Tablet x 2   Entered and Authorized by:   Gordy Savers  MD   Signed by:   Gordy Savers  MD on 02/18/2010   Method used:   Print then Give to Patient   RxID:   [id number] FUROSEMIDE 40 MG TABS (FUROSEMIDE) 2 once a day  #180 x 3   Entered and Authorized by:   Gordy Savers  MD   Signed by:   Gordy Savers  MD on 02/18/2010   Method used:   Print then Give to Patient   RxID:   [id number] ALPRAZOLAM 2 MG TABS (ALPRAZOLAM) 1 three times a day  #90 x 4   Entered and Authorized by:   Gordy Savers  MD   Signed by:   Gordy Savers  MD on 02/18/2010   Method used:   Print then Give to Patient   RxID:   [id number]

## 2011-01-27 NOTE — Assessment & Plan Note (Signed)
Summary: emp-will fast//cccm/pt rescd//ccm    Vital Signs:  Patient profile:   65 year old female Height:      62.5 inches Weight:      185 pounds BMI:     33.42 Temp:     98.4 degrees F oral BP sitting:   112 / 76  (left arm) Cuff size:   regular  Vitals Entered By: Duard Brady LPN (April 14, 2010 9:34 AM) nd aCC: cpx - doing well   fasting for labs Is Patient Diabetic? No   CC:  cpx - doing well   fasting for labs.  History of Present Illness: 65 year old patient who is seen today for a wellness exam.  Medical problems include the osteoarthritis.  She is anticipating left shoulder replacement surgery in the near future.  She has osteopenia hypothyroidism, and a history of mild hypertension.  She has a history of gastroesophageal reflux disease controlled on diet and lifestyle and also remote history of depression.  She is done well today without concerns or complaints  Preventive Screening-Counseling & Management  Alcohol-Tobacco     Smoking Status: never  Caffeine-Diet-Exercise     Does Patient Exercise: no  Allergies (verified): No Known Drug Allergies  Past History:  Past Medical History: Reviewed history from 02/18/2009 and no changes required. Depression GERD Hypertension Hypothyroidism,  goiter insomnia Allergic rhinitis cholelithiasis Osteopenia Low back pain  Past Surgical History: Hysterectomy Eye surgery Back surgeries times 4  s/ lap chole '09 s/p EGD and colonoscopy '08 right shoulder surgery 12/ 2010 Ranell Patrick)  Family History: Reviewed history from 01/05/2008 and no changes required. father deceased  unclear causes mother died age 78  probable CAD  one brother history lung cancer   Two sisters positive for  hypertension, thyroid disorder and diabetes  Social History: Reviewed history and no changes required. Married Never Smoked Regular exercise-no 2 sons Does Patient Exercise:  no  Review of Systems       The patient  complains of difficulty walking.  The patient denies anorexia, fever, weight loss, weight gain, vision loss, decreased hearing, hoarseness, chest pain, syncope, dyspnea on exertion, peripheral edema, prolonged cough, headaches, hemoptysis, abdominal pain, melena, hematochezia, severe indigestion/heartburn, hematuria, incontinence, genital sores, muscle weakness, suspicious skin lesions, transient blindness, depression, unusual weight change, abnormal bleeding, enlarged lymph nodes, angioedema, and breast masses.    Physical Exam  General:  overweight-appearing.  normal blood pressure Head:  Normocephalic and atraumatic without obvious abnormalities. No apparent alopecia or balding. Eyes:  No corneal or conjunctival inflammation noted. EOMI. Perrla. Funduscopic exam benign, without hemorrhages, exudates or papilledema. Vision grossly normal. Ears:  External ear exam shows no significant lesions or deformities.  Otoscopic examination reveals clear canals, tympanic membranes are intact bilaterally without bulging, retraction, inflammation or discharge. Hearing is grossly normal bilaterally. Nose:  External nasal examination shows no deformity or inflammation. Nasal mucosa are pink and moist without lesions or exudates. Mouth:  Oral mucosa and oropharynx without lesions or exudates.  Teeth in good repair. Neck:  No deformities, masses, or tenderness noted. Chest Wall:  No deformities, masses, or tenderness noted. Breasts:  No mass, nodules, thickening, tenderness, bulging, retraction, inflamation, nipple discharge or skin changes noted.   Lungs:  Normal respiratory effort, chest expands symmetrically. Lungs are clear to auscultation, no crackles or wheezes. Heart:  Normal rate and regular rhythm. S1 and S2 normal without gallop, murmur, click, rub or other extra sounds. Abdomen:  Bowel sounds positive,abdomen soft and non-tender without masses, organomegaly or  hernias noted. Rectal:  No external  abnormalities noted. Normal sphincter tone. No rectal masses or tenderness. Genitalia:  status post hysterectomy Msk:  No deformity or scoliosis noted of thoracic or lumbar spine.   Pulses:  faint pedal pulses Extremities:  No clubbing, cyanosis, edema, or deformity noted with normal full range of motion of all joints.   Neurologic:  possibly mild diminished sensation distally Skin:  Intact without suspicious lesions or rashes Cervical Nodes:  No lymphadenopathy noted Axillary Nodes:  No palpable lymphadenopathy Inguinal Nodes:  No significant adenopathy Psych:  Cognition and judgment appear intact. Alert and cooperative with normal attention span and concentration. No apparent delusions, illusions, hallucinations   Impression & Recommendations:  Problem # 1:  PREVENTIVE HEALTH CARE (ICD-V70.0)  Orders: TLB-Lipid Panel (80061-LIPID) TLB-BMP (Basic Metabolic Panel-BMET) (80048-METABOL) TLB-CBC Platelet - w/Differential (85025-CBCD) TLB-Hepatic/Liver Function Pnl (80076-HEPATIC)  Complete Medication List: 1)  Alprazolam 2 Mg Tabs (Alprazolam) .Marland Kitchen.. 1 three times a day 2)  Furosemide 40 Mg Tabs (Furosemide) .... 2 once a day 3)  Methocarbamol 500 Mg Tabs (Methocarbamol) .Marland Kitchen.. 1 three times a day 4)  Vicodin 5-500 Mg Tabs (Hydrocodone-acetaminophen) .Marland Kitchen.. 1 q4h as needed 5)  Synthroid 75 Mcg Tabs (Levothyroxine sodium) .Marland Kitchen.. 1 once daily 6)  Duratuss Ac 12 15-12.5-15 Mg/45ml Susp (Pe hcl-diphenhyd hcl-dm hbrtan) .... 2 tsp q 12 hr 7)  Adult Aspirin Ec Low Strength 81 Mg Tbec (Aspirin) .Marland Kitchen.. 1 once daily 8)  Meloxicam 15 Mg Tabs (Meloxicam) .... 1/2 - 1 once daily  Other Orders: EKG w/ Interpretation (93000) Venipuncture (16109) TLB-TSH (Thyroid Stimulating Hormone) (84443-TSH)  Patient Instructions: 1)  Please schedule a follow-up appointment in 6 months. 2)  Limit your Sodium (Salt). 3)  It is important that you exercise regularly at least 20 minutes 5 times a week. If you develop  chest pain, have severe difficulty breathing, or feel very tired , stop exercising immediately and seek medical attention. 4)  You need to lose weight. Consider a lower calorie diet and regular exercise.  5)  Take calcium +Vitamin D daily.

## 2011-01-27 NOTE — Letter (Signed)
Summary: [facility]  [facility]   Imported By: Maryln Gottron 11/28/2010 11:21:40  _____________________________________________________________________  External Attachment:    Type:   Image     Comment:   External Document

## 2011-01-29 NOTE — Progress Notes (Signed)
Summary: refill xanax - denied  Phone Note Refill Request Message from:  Fax from Pharmacy on January 06, 2011 2:26 PM  Refills Requested: Medication #1:  ALPRAZOLAM 2 MG TABS 1 three times a day [facility]   Method Requested: Fax to Local Pharmacy Initial call taken by: Duard Brady LPN,  January 06, 2011 2:26 PM  Follow-up for Phone Call        denied - pt was given printed rx 11/14/10 office visit with refills. KIK Follow-up by: Duard Brady LPN,  January 06, 2011 2:27 PM

## 2011-01-29 NOTE — Progress Notes (Signed)
Summary: refill methocarb.  Phone Note Refill Request Message from:  Fax from Pharmacy on December 08, 2010 2:09 PM  Refills Requested: Medication #1:  METHOCARBAMOL 500 MG TABS 1 three times a day   Last Refilled: 11/09/2010 [name]   Method Requested: Fax to Local Pharmacy Initial call taken by: Duard Brady LPN,  December 08, 2010 2:10 PM    Prescriptions: METHOCARBAMOL 500 MG TABS (METHOCARBAMOL) 1 three times a day  #90 Tablet x 1   Entered by:   Duard Brady LPN   Authorized by:   Gordy Savers  MD   Signed by:   Duard Brady LPN on 28/41/3244   Method used:   Historical   RxID:   [id number]  faxed [facility] TO [facility]   kik

## 2011-02-04 ENCOUNTER — Other Ambulatory Visit: Payer: Self-pay

## 2011-02-04 DIAGNOSIS — R52 Pain, unspecified: Secondary | ICD-10-CM

## 2011-02-05 ENCOUNTER — Telehealth: Payer: Self-pay

## 2011-02-05 MED ORDER — METHOCARBAMOL 500 MG PO TABS: 500.0000 mg | ORAL_TABLET | Freq: Three times a day (TID) | ORAL | Status: AC

## 2011-02-06 ENCOUNTER — Ambulatory Visit (HOSPITAL_BASED_OUTPATIENT_CLINIC_OR_DEPARTMENT_OTHER)
Admission: RE | Admit: 2011-02-06 | Payer: PRIVATE HEALTH INSURANCE | Source: Ambulatory Visit | Admitting: Otolaryngology

## 2011-02-06 ENCOUNTER — Ambulatory Visit (HOSPITAL_BASED_OUTPATIENT_CLINIC_OR_DEPARTMENT_OTHER)
Admission: RE | Admit: 2011-02-06 | Discharge: 2011-02-06 | Disposition: A | Discharge status: Home / Self Care | Payer: PRIVATE HEALTH INSURANCE | Source: Ambulatory Visit | Attending: Otolaryngology | Admitting: Otolaryngology

## 2011-02-06 ENCOUNTER — Other Ambulatory Visit: Payer: Self-pay | Admitting: Otolaryngology

## 2011-02-06 DIAGNOSIS — R220 Localized swelling, mass and lump, head: Secondary | ICD-10-CM | POA: Insufficient documentation

## 2011-02-06 NOTE — Telephone Encounter (Signed)
done

## 2011-02-09 NOTE — Op Note (Signed)
  April Brewer, April Brewer               ACCOUNT NO.:  [id number]  MEDICAL RECORD NO.:  [id number]           PATIENT TYPE:  LOCATION:                                 FACILITY:  PHYSICIAN:  Kristine Garbe. Ezzard Standing, M.D.DATE OF BIRTH:  01-Aug-1946  DATE OF PROCEDURE:  02/06/2011 DATE OF DISCHARGE:                              OPERATIVE REPORT   PREOPERATIVE DIAGNOSIS:  Right posterior neck mass.  POSTOPERATIVE DIAGNOSIS:  Right posterior neck mass.  OPERATION:  Excision of right deep cervical posterior neck mass.  SURGEON:  Kristine Garbe. Ezzard Standing, MD  ANESTHESIA:  Local 1% Xylocaine with 1:200,000 epinephrine.  COMPLICATIONS:  None.  CLINICAL NOTE:  April Brewer is a 65 year old female who has had a persistent right posterior neck node for a number of years.  Recently, it swelled up and got infected and enlarged.  This was treated with antibiotics and has gone back down in size, but it still a measures about a 2 cm size and this has been a persistent node there for several years.  She was taken to the operating room at this time for excisional biopsy of right posterior cervical neck node.  DESCRIPTION OF PROCEDURE:  The patient was brought to the operating room.  The right neck mass was prepped with Betadine solution, approximately 2 mL of Xylocaine with epinephrine were injected for local anesthetic and an incision was made directly over the mass.  Dissection was carried out down through the subcutaneous tissue and the node which was adherent to some of surrounding fat from previous inflammation was dissected out.  Hemostasis was obtained with a cautery.  After excision of the mass, this was sent in saline to Pathology.  Small defect was closed with 3-0 chromic sutures subcutaneously and Dermabond to reapproximate skin edges.  April Brewer tolerated this well and discharged home later this morning.  DISPOSITION:  She was discharged home on Tylenol, Motrin p.r.n. pain. We will have her  follow up in my office in 6-10 days for recheck.          ______________________________ Kristine Garbe. Ezzard Standing, M.D.     CEN/MEDQ  D:  02/06/2011  T:  02/06/2011  Job:  [id number]  Electronically Signed by Dillard Cannon M.D. on 02/09/2011 09:52:16 AM

## 2011-02-20 ENCOUNTER — Ambulatory Visit: Payer: Self-pay | Admitting: Internal Medicine

## 2011-02-27 ENCOUNTER — Encounter: Payer: Self-pay | Admitting: Internal Medicine

## 2011-03-02 ENCOUNTER — Encounter: Payer: Self-pay | Admitting: Internal Medicine

## 2011-03-02 ENCOUNTER — Ambulatory Visit (INDEPENDENT_AMBULATORY_CARE_PROVIDER_SITE_OTHER): Payer: PRIVATE HEALTH INSURANCE | Admitting: Internal Medicine

## 2011-03-02 DIAGNOSIS — K219 Gastro-esophageal reflux disease without esophagitis: Secondary | ICD-10-CM

## 2011-03-02 DIAGNOSIS — E039 Hypothyroidism, unspecified: Secondary | ICD-10-CM

## 2011-03-02 DIAGNOSIS — I10 Essential (primary) hypertension: Secondary | ICD-10-CM

## 2011-03-02 DIAGNOSIS — J309 Allergic rhinitis, unspecified: Secondary | ICD-10-CM

## 2011-03-02 DIAGNOSIS — F329 Major depressive disorder, single episode, unspecified: Secondary | ICD-10-CM

## 2011-03-02 NOTE — Patient Instructions (Signed)
Limit your sodium (Salt) intake    It is important that you exercise regularly, at least 20 minutes 3 to 4 times per week.  If you develop chest pain or shortness of breath seek  medical attention.  Take a calcium supplement, plus [phone] units of vitamin D  You need to lose weight.  Consider a lower calorie diet and regular exercise.  Please check your blood pressure on a regular basis.  If it is consistently greater than 150/90, please make an office appointment.  Return in 6 months for follow-up

## 2011-03-02 NOTE — Progress Notes (Signed)
  Subjective:    Patient ID: April Brewer, female    DOB: 1946/02/08, 65 y.o.   MRN: [id number]  HPI   65 year old patient who is seen today for followup. She has treated hypertension and history of osteopenia. Apparently she does not take calcium or vitamin D supplements. She has a history of depression treated with sertraline. This has been stable. She is followed by orthopedics and is seeing Dr. Ranell Patrick and Dr. Charlann Boxer in the past;  3 days ago she was evaluated at an urgent care do to chronic intermittent left foot pain. She was told that she had an old fracture involving the left foot. She complains of intermittent pain involving the left foot since a fall in August of last year. She has gastroesophageal reflux disease which has been stable she has allergic rhinitis which has been well through the winter.   Review of Systems  Constitutional: Negative.   HENT: Negative for hearing loss, congestion, sore throat, rhinorrhea, dental problem, sinus pressure and tinnitus.   Eyes: Negative for pain, discharge and visual disturbance.  Respiratory: Negative for cough and shortness of breath.   Cardiovascular: Negative for chest pain, palpitations and leg swelling.  Gastrointestinal: Negative for nausea, vomiting, abdominal pain, diarrhea, constipation, blood in stool and abdominal distention.  Genitourinary: Negative for dysuria, urgency, frequency, hematuria, flank pain, vaginal bleeding, vaginal discharge, difficulty urinating, vaginal pain and pelvic pain.  Musculoskeletal: Negative for joint swelling, arthralgias and gait problem.  Skin: Negative for rash.  Neurological: Negative for dizziness, syncope, speech difficulty, weakness, numbness and headaches.  Hematological: Negative for adenopathy.  Psychiatric/Behavioral: Negative for behavioral problems, dysphoric mood and agitation. The patient is not nervous/anxious.        Objective:   Physical Exam  Constitutional: She is oriented to person,  place, and time. She appears well-developed and well-nourished.        overweight  HENT:  Head: Normocephalic.  Right Ear: External ear normal.  Left Ear: External ear normal.  Mouth/Throat: Oropharynx is clear and moist.  Eyes: Conjunctivae and EOM are normal. Pupils are equal, round, and reactive to light.  Neck: Normal range of motion. Neck supple. No thyromegaly present.  Cardiovascular: Normal rate, regular rhythm, normal heart sounds and intact distal pulses.   Pulmonary/Chest: Effort normal and breath sounds normal.  Abdominal: Soft. Bowel sounds are normal. She exhibits no mass. There is no tenderness.  Musculoskeletal: Normal range of motion.        There is very slight tenderness involving the dorsolateral aspect of the left foot. No soft tissue swelling or ecchymoses noted  Lymphadenopathy:    She has no cervical adenopathy.  Neurological: She is alert and oriented to person, place, and time.  Skin: Skin is warm and dry. No rash noted.  Psychiatric: She has a normal mood and affect. Her behavior is normal.          Assessment & Plan:   foot pain- follow up with orthopedics if persistent   Depression stable  Hypertension well controlled  Gastroesophageal reflux disease stable  Allergic rhinitis  History of osteopenia. Vitamin D and calcium recommended

## 2011-03-10 ENCOUNTER — Telehealth: Payer: Self-pay

## 2011-03-10 MED ORDER — ALPRAZOLAM 2 MG PO TABS: 2.0000 mg | ORAL_TABLET | Freq: Three times a day (TID) | ORAL | Status: DC | PRN

## 2011-03-10 MED ORDER — METHOCARBAMOL 500 MG PO TABS: 500.0000 mg | ORAL_TABLET | Freq: Three times a day (TID) | ORAL | Status: DC

## 2011-03-10 MED ORDER — HYDROCODONE-ACETAMINOPHEN 5-500 MG PO TABS: 1.0000 | ORAL_TABLET | ORAL | Status: DC | PRN

## 2011-03-10 MED ORDER — FUROSEMIDE 40 MG PO TABS: ORAL_TABLET | ORAL | Status: DC

## 2011-03-10 NOTE — Telephone Encounter (Signed)
Ok to rf all except xanax that she should have another rf

## 2011-03-10 NOTE — Telephone Encounter (Signed)
Refill request for vicodin 5-500 , last filled 11/14/10 #90 RF2 Xanax 2mg  - last filled 11/14/10 #90 RF4 Robaxin 500mg  - last filled 12/08/10 #90 RF1 Lasix 40mg  - last filled 11/14/10 #180 RF3  Just seen in office 03/02/11 - no refills at that time. , Please advise

## 2011-03-10 NOTE — Telephone Encounter (Signed)
[facility] to [facility] 

## 2011-03-31 LAB — URINALYSIS, ROUTINE W REFLEX MICROSCOPIC
Bilirubin Urine: NEGATIVE
Hgb urine dipstick: NEGATIVE
Specific Gravity, Urine: 1.019 (ref 1.005–1.030)
pH: 5 (ref 5.0–8.0)

## 2011-03-31 LAB — CBC
HCT: 25.4 % — ABNORMAL LOW (ref 36.0–46.0)
HCT: 38.9 % (ref 36.0–46.0)
MCHC: 34 g/dL (ref 30.0–36.0)
MCHC: 34.5 g/dL (ref 30.0–36.0)
MCV: 96.3 fL (ref 78.0–100.0)
MCV: 97.2 fL (ref 78.0–100.0)
MCV: 98.2 fL (ref 78.0–100.0)
Platelets: 163 10*3/uL (ref 150–400)
Platelets: 237 10*3/uL (ref 150–400)
RBC: 2.61 MIL/uL — ABNORMAL LOW (ref 3.87–5.11)
RBC: 4.04 MIL/uL (ref 3.87–5.11)
RDW: 13.5 % (ref 11.5–15.5)
RDW: 13.9 % (ref 11.5–15.5)
WBC: 7.8 10*3/uL (ref 4.0–10.5)
WBC: 7.9 10*3/uL (ref 4.0–10.5)

## 2011-03-31 LAB — BASIC METABOLIC PANEL
BUN: 4 mg/dL — ABNORMAL LOW (ref 6–23)
BUN: 4 mg/dL — ABNORMAL LOW (ref 6–23)
CO2: 24 mEq/L (ref 19–32)
CO2: 25 mEq/L (ref 19–32)
Calcium: 8.2 mg/dL — ABNORMAL LOW (ref 8.4–10.5)
Calcium: 9.8 mg/dL (ref 8.4–10.5)
Chloride: 103 mEq/L (ref 96–112)
Chloride: 97 mEq/L (ref 96–112)
Creatinine, Ser: 0.65 mg/dL (ref 0.4–1.2)
Creatinine, Ser: 0.7 mg/dL (ref 0.4–1.2)
Creatinine, Ser: 0.85 mg/dL (ref 0.4–1.2)
GFR calc Af Amer: >60 mL/min (ref >60)
GFR calc Af Amer: >60 mL/min (ref >60)
Potassium: 3.4 mEq/L — ABNORMAL LOW (ref 3.5–5.1)
Potassium: 3.7 mEq/L (ref 3.5–5.1)
Sodium: 138 mEq/L (ref 135–145)

## 2011-03-31 LAB — DIFFERENTIAL
Basophils Absolute: 0 10*3/uL (ref 0.0–0.1)
Basophils Relative: 1 % (ref 0–1)
Eosinophils Absolute: 0.2 10*3/uL (ref 0.0–0.7)
Neutro Abs: 3.7 10*3/uL (ref 1.7–7.7)
Neutrophils Relative %: 48 % (ref 43–77)

## 2011-03-31 LAB — URINE MICROSCOPIC-ADD ON

## 2011-03-31 LAB — TYPE AND SCREEN: ABO/RH(D): O POS

## 2011-03-31 LAB — ABO/RH: ABO/RH(D): O POS

## 2011-04-06 LAB — COMPREHENSIVE METABOLIC PANEL
ALT: 31 U/L (ref 0–35)
AST: 41 U/L — ABNORMAL HIGH (ref 0–37)
Albumin: 4.3 g/dL (ref 3.5–5.2)
CO2: 30 mEq/L (ref 19–32)
Chloride: 100 mEq/L (ref 96–112)
Creatinine, Ser: 0.83 mg/dL (ref 0.4–1.2)
GFR calc Af Amer: >60 mL/min (ref >60)
GFR calc non Af Amer: >60 mL/min (ref >60)
Potassium: 3.4 mEq/L — ABNORMAL LOW (ref 3.5–5.1)
Sodium: 138 mEq/L (ref 135–145)
Total Bilirubin: 0.5 mg/dL (ref 0.3–1.2)

## 2011-04-06 LAB — CBC
HCT: 29.8 % — ABNORMAL LOW (ref 36.0–46.0)
MCV: 94.5 fL (ref 78.0–100.0)
MCV: 95.6 fL (ref 78.0–100.0)
Platelets: 217 10*3/uL (ref 150–400)
Platelets: 259 10*3/uL (ref 150–400)
RBC: 3.79 MIL/uL — ABNORMAL LOW (ref 3.87–5.11)
RDW: 13.3 % (ref 11.5–15.5)
WBC: 6.8 10*3/uL (ref 4.0–10.5)

## 2011-04-06 LAB — DIFFERENTIAL
Basophils Absolute: 0 10*3/uL (ref 0.0–0.1)
Eosinophils Absolute: 0.3 10*3/uL (ref 0.0–0.7)
Eosinophils Relative: 4 % (ref 0–5)
Lymphocytes Relative: 43 % (ref 12–46)
Monocytes Absolute: 0.8 10*3/uL (ref 0.1–1.0)

## 2011-04-13 ENCOUNTER — Other Ambulatory Visit: Payer: Self-pay

## 2011-04-13 MED ORDER — ALPRAZOLAM 2 MG PO TABS: 2.0000 mg | ORAL_TABLET | Freq: Three times a day (TID) | ORAL | Status: DC | PRN

## 2011-04-13 MED ORDER — HYDROCODONE-ACETAMINOPHEN 5-500 MG PO TABS: 1.0000 | ORAL_TABLET | ORAL | Status: DC | PRN

## 2011-04-13 MED ORDER — METHOCARBAMOL 500 MG PO TABS: 500.0000 mg | ORAL_TABLET | Freq: Three times a day (TID) | ORAL | Status: DC

## 2011-04-13 NOTE — Telephone Encounter (Signed)
Into office - refill ok/d by dr. Amador Cunas

## 2011-04-22 LAB — HM MAMMOGRAPHY: HM Mammogram: NEGATIVE

## 2011-04-23 ENCOUNTER — Encounter: Payer: Self-pay | Admitting: Internal Medicine

## 2011-04-24 ENCOUNTER — Encounter: Payer: Self-pay | Admitting: Internal Medicine

## 2011-05-12 NOTE — Assessment & Plan Note (Signed)
[facility]                         GASTROENTEROLOGY OFFICE NOTE   NAME:Brewer, April HEFEL                      MRN:          [id number]  DATE:12/07/2007                            DOB:          July 08, 1946    [name] complains of ongoing, generalized abdominal pain.  Her  symptoms are not necessarily brought on by meals, bowel movement or any  specific activities or medications.  She likely has mild GERD, and she  has been treated for the past 4 weeks with omeprazole 40 mg p.o. q.a.m.;  with no change in symptoms.   Current medications:  Listed on the chart, updated and reviewed.   MEDICATION ALLERGIES:  NONE KNOWN.   PHYSICAL EXAMINATION:  No acute distress.  Weight 191 pounds, blood  pressure 108/62, pulse 84 and regular.  CHEST:  Clear to auscultation bilaterally.  CARDIAC:  Regular rate and rhythm without murmurs.  ABDOMEN:  Soft with mild, diffuse tenderness on deep palpation.  No  rebound or guarding.  No palpable organomegaly, masses or hernias.  She  also has mild bilateral flank tenderness.   ASSESSMENT AND PLAN:  1. CHOLELITHIASIS WITH GENERALIZED ABDOMINAL PAIN.  It is not clear to      me that her symptoms are biliary.  Recent blood work showed normal      liver function tests and a normal CBC.  Begin Robinul-Forte one      b.i.d.  General surgical consultation for consideration of      cholecystectomy.  Although her symptoms are not classic, I cannot      find any other cause for her abdominal pain except for possible      functional abdominal pain. Assess response to Robinul forte. Return      office visit in 6 months and p.r.n.  2. GASTROESOPHAGEAL REFLUX DISEASE.  Appears mild and under control.      Will change to pantoprazole 40 mg p.o. q.a.m. for cost reasons.     Venita Lick. Russella Dar, MD, [facility]  Electronically Signed    MTS/MedQ  DD: 12/07/2007  DT: 12/08/2007  Job #: [id number]   cc:   Gordy Savers, MD

## 2011-05-12 NOTE — Assessment & Plan Note (Signed)
[facility]                         GASTROENTEROLOGY OFFICE NOTE   NAME:April Brewer, April Brewer                      MRN:          [id number]  DATE:10/26/2007                            DOB:          [birth year]    REFERRING PHYSICIAN:  Gordy Savers, MD   OFFICE CONSULTATION NOTE:   REASON FOR CONSULTATION:  Generalized abdominal pain and nausea.   HISTORY OF PRESENT ILLNESS:  This is a 65 year old white female whom I  saw previously for a direct referral colonoscopy in July of 2008.  She  has a first cousin with colon cancer.  Her colonoscopy revealed sigmoid  colon diverticulosis and no other abnormalities.  She apparently has had  problems with abdominal pain for about the past 4-6 months.  She  occasionally has nausea when her pain is worse.  She describes the pain  as a constant pain that often begins in the epigastric area but radiates  across her entire abdomen and both flanks.  She has chronic back pain  and is status post 4 lower back surgeries by Dr. Otelia Sergeant.  Her back pain  has worsened over the past few months, but is not dramatically worse.  She was evaluated by Dr. Fredia Sorrow and had a pelvic exam, Pap smear and  pelvic ultrasound in about July of 2008 that were apparently all  unremarkable.  She had a recent CT scan of the abdomen ordered by Dr.  Amador Cunas which showed diffuse fatty infiltration of the liver and  mild dilation of the common bile duct in the region of the porta hepatis  measuring 8 mm.  The common bile duct was normal in caliber distally,  and there was no intrahepatic dilatation.  She does not feel her  symptoms are either improved or worsened by meals or bowel movements.  She notes no bloating, gas, constipation, diarrhea, change in the stool  caliber, melena, hematochezia, dysphagia, odynophagia, or heartburn.  She was treated with Nexium for several weeks, which she feels worsened  her symptoms.  She feels less pain  since she discontinued Nexium.   PAST MEDICAL HISTORY:  1. Chronic back pain - status post multiple back surgeries by Dr.      Otelia Sergeant.  2. Hypothyroidism.  3. Anxiety.  4. Diverticulosis.  5. Status post hysterectomy.   CURRENT MEDICATIONS:  Listed on the chart, updated and reviewed.   MEDICATION ALLERGIES:  None known.   SOCIAL HISTORY:  Per the handwritten form.   REVIEW OF SYSTEMS:  Per the handwritten form.   PHYSICAL EXAMINATION:  GENERAL:  An otherwise white female in no acute  distress.  VITAL SIGNS:  Height 5 feet, 4 inches, weight 188 pounds.  Blood  pressure is 116/80, pulse 96 and regular.  HEENT:  Anicteric sclerae.  Oropharynx clear.  CHEST:  Clear to auscultation bilaterally.  CARDIAC:  Regular rate and rhythm without murmurs appreciated.  ABDOMEN:  Large, soft and diffusely tender.  No rebound or guarding.  No  palpable organomegaly, masses or hernias.  She has mild bilateral flank  tenderness, as well.  BACK:  Lower thoracic and  lumbar area incisions, consistent with her  prior surgical history.  Mild spinal and paraspinal tenderness.  No CVA  tenderness.  EXTREMITIES:  Without clubbing, cyanosis, or edema.  NEUROLOGIC:  Alert and oriented x3.  Grossly nonfocal.   ASSESSMENT AND PLAN:  1. Generalized abdominal pain associated with intermittent nausea.      The etiology of her pain is not clear.  It may be related to her      chronic back pain.  Need to exclude ulcer disease and      cholelithiasis.  Will obtain a CBC, CMET, lipase and TSH today.      Schedule abdominal ultrasound and an upper endoscopy.  Risks,      benefits, and alternatives to upper endoscopy discussed with the      patient, and she consents to proceed.  2. Borderline common bile duct dilation.  I think this is likely not      clinically significant.  Abdominal ultrasound for further      evaluation and liver function tests as above.  3. Fatty infiltration of the liver.  Long-term low-fat  weight loss      diet supervised by Dr. Amador Cunas.  4. Diverticulosis.  A long-term high fiber diet with adequate fluid      intake.     Venita Lick. Russella Dar, MD, [facility]  Electronically Signed    MTS/MedQ  DD: 10/26/2007  DT: 10/26/2007  Job #: [id number]   cc:   Gordy Savers, MD

## 2011-05-12 NOTE — Assessment & Plan Note (Signed)
[facility] OFFICE NOTE   NAME:April Brewer, April Brewer                      MRN:          [id number]  DATE:05/26/2007                            DOB:          04/17/1946    A 65 year old female seen today to establish with our practice.  She is  a transfer from [place].  Her medical records are reviewed.  She was  hospitalized 1 month ago for atypical chest pain.   MEDICAL PROBLEMS:  Hypertension, gastroesophageal reflux disease.  She  has a history of anxiety and depression and insomnia.  She has been  followed for a goiter and hypothyroidism.  Additionally, she has  seasonal allergic rhinitis, history of osteopenia.  She has had 4 back  operations and is followed by Dr. Otelia Sergeant who prescribes her analgesia.  In addition to the back surgery, she has had a hysterectomy and also eye  surgery.   REVIEW OF SYSTEMS:  Complains of ongoing back pain, some occasional  chest and arm discomfort.  No prior colonoscopy.   SOCIAL HISTORY:  She is married, 2 sons, age 76 and 29.   FAMILY HISTORY:  Father died young.  The patient is unaware of details.  Mother died at 98 of an MI.  One brother, died of lung cancer.  Two  sisters, positive for diabetes, hypertension, and thyroid disorder.   PHYSICAL EXAMINATION:  Revealed a moderately obese white female in no  acute distress.  Blood pressure is low normal.  SKIN:  Negative.  FUNDI, EAR, NOSE, THROAT:  Unremarkable.  The patient did have a  moderate goiter.  No bruits or adenopathy.  CHEST:  Clear.  BREAST EXAM:  Negative.  CARDIOVASCULAR EXAM:  Revealed normal S1 and S2.  There is no audible  murmur.  ABDOMEN:  Obese, soft, and nontender.  No organomegaly.  EXTREMITIES:  Revealed trace edema.   IMPRESSION:  1. Hypothyroidism.  2. Chronic low back pain, status post multiple surgeries.  3. History of peripheral edema.  4. History of hypertension.  5. Gastroesophageal reflux  disease.   DISPOSITION:  Her medical regimen at the present time is minimal.  This  includes alprazolam, Synthroid, furosemide, and methocarbamol.  Return  in 3 months for followup.  Will continue close followup with Dr. Otelia Sergeant.     Gordy Savers, MD  Electronically Signed    PFK/MedQ  DD: 05/26/2007  DT: 05/26/2007  Job #: [id number]

## 2011-05-12 NOTE — Op Note (Signed)
[name], [name]               ACCOUNT NO.:  [id number]   MEDICAL RECORD NO.:  [id number]          PATIENT TYPE:  AMB   LOCATION:  SDS                          FACILITY:  [facility]   PHYSICIAN:  Sandria Bales. Ezzard Standing, M.D.  DATE OF BIRTH:  1946-08-16   DATE OF PROCEDURE:  03/01/2008  DATE OF DISCHARGE:                               OPERATIVE REPORT   Why can't date of surgery be here?   PREOPERATIVE DIAGNOSIS:  Symptomatic cholelithiasis.   POSTOPERATIVE DIAGNOSIS:  Chronic cholecystitis, cholelithiasis.   PROCEDURE:  Laparoscopic cholecystectomy with intraoperative  cholangiogram.   SURGEON:  Sandria Bales. Ezzard Standing, M.D.   FIRST ASSISTANT:  None.   ANESTHESIA:  General endotracheal.   ESTIMATED BLOOD LOSS:  Minimal.   INDICATIONS FOR PROCEDURE:  April Brewer is a 65 year old white female  who is a patient of April Savers, MD, who has some vague  abdominal complaints.  She has also seen Dr. Claudette Brewer at his  office.  She had a ultrasound which showed multiple gallstones.  She has  been through an evaluation which has included a CT scan of her abdomen  and upper endoscopy, which were negative.  I discussed with her about  the indications and potential complications of gallbladder surgery.   Potential complications of gallbladder surgery include, but are not  limited to, bleeding, infection, bile duct injury and the possibility of  open surgery.   OPERATIVE NOTE:  The patient placed in a supine position, given a  general endotracheal anesthetic.  Her abdomen was prepped with Betadine  solution and sterilely draped.  A time-out was held identifying the  patient and the procedure.   I put her in reverse Trendelenburg position and accessed her abdominal  cavity through an Hasson trocar that I cut down at the infraumbilical  incision and secured this Hasson trocar with a zero Vicryl suture.  I  passed a 0 degree laparoscope through a 12 mg Hasson trocar.  I looked  at the  abdominal exploration, lower pelvis was unremarkable.  The right  and left lobes of the liver were unremarkable.  The anterior wall of the  stomach was unremarkable.  The bowel that I could see was unremarkable.  There is no other nodule mass within the belly.   I then placed three additional trocars, a 10 mm subxiphoid trocar, a 5  mm mid subcostal and a 5 mm lateral subcostal.  I dissected out and  identified the cystic duct and the triangle of Calot and there was a  cystic artery going through the triangle of Calot.  I then shot an  intraoperative cholangiogram.  I placed a clip on the gallbladder side  of the cystic duct, cut the cystic duct and passed a taut catheter  through the abdominal wall through a 14-gauge Jelco into the cut cystic  duct and secured this with a clip.  I then shot an intraoperative  angiogram with half strength Omnipaque.  This showed free flow of  contrast down the cystic duct, into the common bile duct, up to the  hepatic radicals and into the  duodenum.  There was some flow artifact in  the common bile duct but I saw no stones or debris.  I felt this was a  normal intraoperative cholangiogram.   I then triply endoclipped the cystic duct, removed the taut catheter.  I  doubly endoclipped the cystic artery and divided that.  I then sharply  and bluntly dissected the gallbladder from the gallbladder bed.  Prior  to complete division of the gallbladder from the gallbladder bed, I saw  no bleeding or bile leakage from the triangle of Calot or from the  gallbladder bed.  I delivered the gallbladder into a bag, delivered it  through the umbilicus.   I irrigated the abdomen out with about 500 mL of saline.  I then closed  the umbilical port with a zero Vicryl suture.  Each trocar was then  removed in turn.  There was no bleeding at any trocar site.  I closed  the skin with a 5-0 Vicryl suture, painted the wound with tincture of  Benzoin and steri-stripped it.    The patient tolerated the procedure well, was transported to the  recovery room in good condition.      Sandria Bales. Ezzard Standing, M.D.  Electronically Signed     DHN/MEDQ  D:  03/01/2008  T:  03/01/2008  Job:  [id number]   cc:   April Savers, MD  Venita Lick. Russella Dar, MD, Clementeen Graham
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
## 2011-05-15 NOTE — Discharge Summary (Signed)
April Brewer, April Brewer               ACCOUNT NO.:  [id number]   MEDICAL RECORD NO.:  [id number]          PATIENT TYPE:  INP   LOCATION:  4733                         FACILITY:  [facility]   PHYSICIAN:  Valerie A. Felicity Coyer, MDDATE OF BIRTH:  December 31, 1945   DATE OF ADMISSION:  04/24/2007  DATE OF DISCHARGE:  04/26/2007                               DISCHARGE SUMMARY   DISCHARGE DIAGNOSIS:  1. Atypical chest pain likely secondary to costochondritis.  2. Urinary tract infection.  3. Goiter/hypothyroid with normal TSH on Synthroid.  4. Chronic peripheral edema. A  2-D echocardiogram pending.   HISTORY OF PRESENT ILLNESS:  1. April Brewer is a 65 year old white female who was admitted on      April 24, 2007, with atypical chest pain.  She underwent serial      cardiac enzymes.  She was noted to have a mild elevation of      troponin in one set of point of care enzymes.  However, repeat was      normal and follow up cardiac enzymes were normal.  __________  was      likely secondary to lab error.  The patient was noted to have      reproducible anterior wall chest pattern, most likely secondary to      costochondritis.  She does have a history of chronic peripheral      edema and takes Lasix and as a result of that plus some low blood      pressures on admission, a 2-D echo was requested.  At time of this      dictation final echo results are pending.  Patient is clinically      euvolemic.  Plan to continue Lasix at same dose as prior to      admission.  2. Urinary tract infection with hypotension.  The patient was given IV      hydration.  Hypotension may have been due to dehydration as she has      no clear signs of sepsis since admission.  She has remained      afebrile for the last 24 hours.  She is currently on IV Rocephin      which will be changed to oral Ceftin at time of discharge.  Final      culture results are pending.   PERTINENT LABORATORY DATA:  At time of discharge, TSH 3.314,  hemoglobin  11.5, hematocrit 33.8.   DISPOSITION:  The patient be discharged to home.   DISCHARGE MEDICATIONS:  1. Ceftin 250 mg p.o. b.i.d. for 5 days.  2. Synthroid 50 mcg p.o. daily.  3. Lasix 80 mg p.o. daily.  4. Xanax 2 mg p.o. t.i.d. as needed.   FOLLOW UP:  The patient is instructed to follow up with Dr. Eleonore Chiquito as scheduled.      [name], NP      Raenette Rover. Felicity Coyer, MD  Electronically Signed    MO/MEDQ  D:  04/26/2007  T:  04/26/2007  Job:  [id number]   cc:   Gordy Savers, MD

## 2011-05-15 NOTE — Discharge Summary (Signed)
NAME:  April Brewer, April Brewer                         ACCOUNT NO.:  [id number]   MEDICAL RECORD NO.:  [id number]                   PATIENT TYPE:  INP   LOCATION:  5004                                 FACILITY:  [facility]   PHYSICIAN:  Kerrin Champagne, M.D.                DATE OF BIRTH:  October 07, 1946   DATE OF ADMISSION:  10/18/2003  DATE OF DISCHARGE:  10/22/2003                                 DISCHARGE SUMMARY   ADMISSION DIAGNOSES:  1. 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  2. Retrolisthesis of L3 on L4 above a previous fusion at L4 to sacrum.  3. Severe intractable pain.  4. Chronic scalp infection treated by minocycline.  5. Goiter.  6. Hypothyroidism.   DISCHARGE DIAGNOSES:  1. 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  2. Retrolisthesis of L3 on L4 above a previous fusion at L4 to sacrum.  3. Severe intractable pain.  4. Chronic scalp infection treated by minocycline.  5. Goiter.  6. Hypothyroidism.  7. Posthemorrhagic anemia.  8. Hypokalemia resolved at discharge.   PROCEDURE:  On October 18, 2003 the patient underwent:  1. Central laminectomy at L3-4 with left-sided 8 mm Leopard transverse     lumbar interbody fusion cage placement utilizing autogenous bone graft.  2. Posterolateral fusion L3-4 with bilateral monarch screws.  3. Right iliac bone graft harvested through a separate incision.  4. Decompression of bilateral L3 and L4 nerve roots. This was performed by     Dr. Otelia Sergeant assisted by Wende Neighbors, P.A.C. under general anesthesia.   CONSULTATIONS:  None.   BRIEF HISTORY:  The patient is a 65 year old white female with previous  right cage interbody fusion L4-5 to L5-S1.  She did fairly well following  the surgery until approximately 8 or 9 months ago.  At that time she started  experiencing severe intractable back pain with radiation into both buttock  and both thighs.  Radiographs demonstrated retrolisthesis of L3 on L4 with  disk space narrowing and a vacuum sign at the L3-4.  End  plate sclerosis was  also noted.  MRI study demonstrated Modic changes at this segment.  It was  felt that she would require surgical intervention; and underwent the  procedure as stated above.   BRIEF HOSPITAL COURSE:  Upon admission, the patient tolerated the procedure  under general anesthesia without complications.  On the first postoperative  day her Hemovac drain was discontinued.  Her wounds were checked daily  following this and found to be healing quite well.  She continued with a  Foley catheter until the morning of the first postoperative day at which  time this was discontinued and she was able to void without difficulty.  Postoperative hemoglobin dropped to the lowest value of 6.5.  She did  require 4 units of packed red blood cells throughout her hospital stay for  her postoperative anemia.   The patient was started on physical therapy for  ambulation and gait training  as well as strengthening exercises.  She was placed in a brace upon  awakening and advised to continue wearing the brace for purposes of  ambulation and being out of bed.  Neurovascular motor function of the lower  extremities remained intact throughout the hospital stay.  She did receive  occupational therapy for ADLs.  Potassium dropped to the lowest value of 3.0  and she was treated with oral supplements.  On October 22, 2003 the patient  was felt to be stable for discharge to her home.  She was voiding well, had  no nausea and had positive bowel movement. She is able to take solid foods  without difficulty.  She was ambulating in the hallway using a walker and  her wound was healing well.  She was afebrile and vital signs were stable as  well.   PERTINENT LABORATORY VALUES:  Hemoglobin and hematocrit as stated above with  transfusion of 4 units of packed red blood cells during the hospital stay.  At discharge hemoglobin and hematocrit were 9.7 and 27 respectively.  Chemistry studies with low potassium  noted and treatment by supplementation.  At discharge potassium 3.2.  AST was noted to be 39 with ALP 207 on  admission.  Urinalysis on admission was negative for urinary tract  infection. EKG on admission: Normal sinus rhythm, nonspecific ST wave  abnormality.  No old tracings for comparison, confirmed by Dr. Myrtis Ser.   PLAN:  The patient has been transferred to her home.  There she will  continue ambulating as tolerated utilizing a walker. She will wear her  brace, at all times, when she is out of bed.  She will continue on a  potassium rich diet.  She will be allowed to shower at home and we will keep  her dressing changed daily. She will be allow to shower as long as there is  no drainage from her wound.   PRESCRIPTIONS AT DISCHARGE:  1. Percocet for pain.  2. Robaxin for spasm.  3. Over-the-counter stool softeners and laxatives as needed.  4. She will also utilize a multivitamin with iron.   DISCHARGE INSTRUCTIONS:  The patient will follow up in 2 weeks with Dr.  Otelia Sergeant; she will call to arrange the appointment.  Should there be questions  or concerns prior to return office visit she has been advised to call.      Wende Neighbors, P.A.                    Kerrin Champagne, M.D.    SMV/MEDQ  D:  11/29/2003  T:  11/29/2003  Job:  [id number]

## 2011-05-15 NOTE — H&P (Signed)
April Brewer, April Brewer               ACCOUNT NO.:  [id number]   MEDICAL RECORD NO.:  [id number]          PATIENT TYPE:  INP   LOCATION:  1833                         FACILITY:  [facility]   PHYSICIAN:  Hollice Espy, M.D.DATE OF BIRTH:  [birth year]   DATE OF ADMISSION:  04/24/2007  DATE OF DISCHARGE:                              HISTORY & PHYSICAL   The patient's PCP is Dr. Amador Cunas.   CHIEF COMPLAINT:  Chest pain.   HISTORY OF PRESENT ILLNESS:  The patient is a 65 year old white female  with a past medical history of hypothyroidism and anxiety who is a poor  historian but apparently has been complaining now for 4-5 days of vague  atypical bilateral shoulder pain radiating down both arms with some  numbness.  This pain was intermittent, then today she started having  chest pain over the left side.  She also was complaining of some pain  down her bilateral legs.  When she started having this shoulder pain  earlier, she had followed up with Urgent Care who had done an EKG, which  according through the patient's interpretation had said she had a heart  attack at some point.  She was then given a prescription for  nitroglycerin which she did not fill.  She then came in to the ER when  she started having the chest pain.  In the emergency room, they ordered  cardiac markers, the first of which the troponin was elevated at 0.95.  However, repeat cardiac markers including CPK, MB and troponins were all  within normal limits.  The patient's d-dimer came back slightly elevated  as well leading to a CT angiogram of the chest to rule out pulmonary  embolus.  The CT angiogram of the chest was negative for pulmonary  embolus as well as any form of airspace disease.  To further confuse  matters, the patient was also noted to be febrile with a temperature of  101.4, heart rate of 104 on admission and have a white count of 11.1 but  with no shift.  Labs were done on the patient and she was found to  have  a large urinary tract infection.   Currently, the patient is doing okay.  She has no complaints now.  She  denies any headaches, vision changes, dysphagia, chest pain,  palpitations, shortness of breath, wheezing, coughing, abdominal pain.  No hematuria, dysuria, constipation, diarrhea, focal extremity numbness,  weakness or pain.   REVIEW OF SYSTEMS:  Currently negative.  She tells me she has previous  heart problems.   PAST MEDICAL HISTORY:  1. Includes hypothyroidism.  2. Peripheral edema.  3. Anxiety.   MEDICATIONS:  1. She is on Synthroid 50 mcg p.o. daily.  2. Lasix 80 mg p.o. daily.  3. Xanax 2 mg p.o. t.i.d.   ALLERGIES:  She has no known drug allergies.   SOCIAL HISTORY:  She denies any tobacco, alcohol or drug use.   FAMILY HISTORY:  Noncontributory.   PHYSICAL EXAMINATION:  VITAL SIGNS:  On admission, temperature 101.4  (down to 97.5), heart rate 124 (down to 98), blood pressure  129/75,  respirations 20, O2 saturation 97% on room air.  GENERAL:  The patient is alert and oriented x3, in no apparent distress.  HEENT:  Normocephalic and atraumatic.  Her mucus membranes are moist.  She has no carotid bruits.  HEART:  Regular rate and rhythm, S1-S2.  LUNGS:  Clear to auscultation bilaterally.  ABDOMEN:  Soft, nontender, and nondistended.  Positive bowel sounds.  EXTREMITIES:  No clubbing, cyanosis, or edema.   LABORATORY DATA:  White count 11.1, H&H 12.4 and 36, MCV of 91, platelet  count 322.  No shift.  Sodium 131, potassium 3.7, chloride 99, bicarb  23, BUN 12, creatinine 1.01, glucose 135.  LFTs are noted only for an  albumin of 3.2.  D-dimer is elevated at 1.8.  coagulases are  unremarkable.  Cardiac markers first set CPK 166, MB 1.1, troponin I  0.95.  Second set had a normal CPK and MB with a troponin now down to  less than 0.05.  A UA shows a large leukocyte esterase with 21-50 white  cells but only few bacteria.  Her ABG shows a pH 7.45, pCO2 24, pO2  79,  bicarb 23.  EKG showed a sinus tachycardia initially.  Again, this  tachycardia is now resolved.   ASSESSMENT/PLAN:  1. Atypical chest pain:  Given the fact that her initial troponin was      likely a lab error but on the other hand she was complaining of      pain and she is over 55, we will go ahead and observe her overnight      on a telemetry bed.  We will also check a repeat set of cardiac      markers.  I likely feel that at least some of her complaints may be      palpitations from initial signs of systemic infection from urinary      tract infection and she may be able to warrant getting an      outpatient stress test.  She would prefer to see Dr. Nicki Guadalajara      at [facility] but she currently does not have      any cardiologist but this may be done as an outpatient.  I will      defer, of course, to Dr. Felicity Coyer the hospitalist who will be      caring for the patient.  2. Urinary tract infection:  This is the cause of the patient's      tachycardia and elevated white count.  We will put the patient on      intravenous Cipro for now given that she is showing early signs of      systemic response and likely we will plan for a 7-day course of      therapy.  3. Hypothyroidism:  Continue Synthroid.  4. Anxiety:  Continue Xanax.      Hollice Espy, M.D.  Electronically Signed     SKK/MEDQ  D:  04/24/2007  T:  04/24/2007  Job:  [id number]   cc:   Gordy Savers, MD  Patient's chart

## 2011-05-15 NOTE — Op Note (Signed)
NAME:  April Brewer, April Brewer                         ACCOUNT NO.:  [id number]   MEDICAL RECORD NO.:  [id number]                   PATIENT TYPE:  INP   LOCATION:  5004                                 FACILITY:  [facility]   PHYSICIAN:  Kerrin Champagne, M.D.                DATE OF BIRTH:  09/21/1946   DATE OF PROCEDURE:  10/18/2003  DATE OF DISCHARGE:                                 OPERATIVE REPORT   PREOPERATIVE DIAGNOSES:  1. 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  2. Retrolisthesis of L3 on L4 above a previous L4-S1 fusion.  3. Severe intractable pain.   POSTOPERATIVE DIAGNOSES:  1. 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  2. Degenerative disk changes of L3-4.  3. Retrolisthesis of L3 on L4.   PROCEDURES:  1. Central laminectomy at L3-4 with left-sided 8 mm Leopard transverse     lumbar interbody fusion cage placement utilizing autogenous bone graft.  2. Posterolateral fusion of L3-4, bilateral L3 5.5 Monarch pedicle screws,     right-sided L4 5.5 Monarch pedicle screw, left-sided L5 6.25 Monarch     pedicle screw, 45 mm Monarch rod on the right side and a 65 mm Monarch     rod on the left side.  3. Right iliac crest bone graft harvested through a separate incision.  4. Decompression of bilateral L3-L4 nerve roots.   SURGEON:  Kerrin Champagne, M.D.   ASSISTANT:  Wende Neighbors, P.A.-C   ANESTHESIA:  GOT.   ANESTHESIOLOGIST:  Zenon Mayo, M.D.   ESTIMATED BLOOD LOSS:  650 mL.   DRAINS:  1. Hemovac x1, right lower back.  2. Foley to straight drain.   BRIEF CLINICAL HISTORY:  The patient, a 65 year old female, has undergone  previous Ray cage interbody fusion at L4-5 and L5-S1.  She has done well  following this up until eight months ago, when she had begun experiencing  severe intractable back pain with radiation to both buttocks and both  thighs.  Radiographs have demonstrated  retrolisthesis of L3 on L4 with disk  space narrowing and vacuum sign at the L3-4 level, endplate sclerosis, MRI  study demonstrating Modic changes at this segment.  She also has some degree  of degenerative disk changes in nearly every segment of the upper lumbar and  lower dorsal spine, although to a mild degree, without associated Modic  changes.  She underwent evaluation.  Ray cage fusion appeared to be solid;  cagograms were unremarkable, flexion and extension radiographs demonstrating  no motion of the lower two segments.  The patient, however, was found to  have significant retrolisthesis of L3 on L4 with severe degenerative changes  here which would explain her persistent pain at this segment above a  previous two-level fusion.   INTRAOPERATIVE FINDINGS:  Severely degenerative disk  at the L3-4 level.  Severe central stenosis associated with the remnants of the superior arch of  the L4 lamina at the very highest level of the L4 to sacrum fusion.  During  the procedure, the left L4 pedicle was broached medially and could not be  used for fixation; therefore, a screw was placed in the left L5 pedicle to  provide fixation along the left side.   DESCRIPTION OF PROCEDURE:  After adequate general anesthesia, with the  patient in a prone position on a Jackson frame, chest rolls, all pressure  points well-padded, intraoperative monitoring using surface electrodes, EMGs  of the lower extremity were performed during the case, standard preoperative  antibiotics, prepped with DuraPrep solution from the mid dorsal spine to the  sacrum and out over the iliac crest on both sides using DuraPrep, draped in  the usual manner, Iodine and Vidrape used.  The incision extended from the  superior aspect of the old incision scar up to the expected L1 level.   Through the skin and subcutaneous layers, incision made approximately 2.5-3  inches in length through the skin and subcutaneous layers down to  the  lumbodorsal fascia, bleeders controlled using electrocautery.  An incision  was also made over the right iliac crest, oblique incision about 2-2.5  inches in length.  Electrocautery used to divide subcutaneous layers; note  that infiltration was performed in both areas, the iliac crest and posterior  spine, using Marcaine 0.5% with 1:200,000 epinephrine.  The lumbodorsal  fascia was then incised over the L2 and L1 spinous processes as well as the  remnant of L3.  The  incision was carried down through the lumbodorsal  fascia over the L4-5 level inferiorly.   A Cobb was then used to elevate the paralumbar muscles off the posterior  aspect of the lamina at L2-L3 bilaterally and also at L1 to a mild degree  bilaterally.  The center portion of the incision inferiorly was carried down  to the posterior aspect of the laminotomy area, care taken not to enter the  laminotomy area, blunt dissection used to bluntly retract and expose the  posterior aspect of the spine at this L4-5 level as well.  McCullough  retractors were inserted.  Intraoperative radiograph with clamp over the  spinous process of L3 and L2 was obtained, identifying these levels, a  Leksell rongeur used to remove a small portion of bone from the L3 spinous  process for continued identification of the levels.  The exposure continued  out over the lateral aspect of the L3 facet, removing the facet capsule in  its entirety, both right and left side, down to the transverse process of  L4.   At the L2-3 level, the facet capsule was preserved and the exposure  continued out laterally over the lateral aspect of the L2-3 facet, down to  the transverse process of L3.  These areas were packed, with careful  hemostasis obtained using bipolar and monopolar electrocautery.  Then using  loupe magnification and headlamp, a Leksell rongeur was used to remove the  spinous process of L3, about 50% of the spinous process of L2.   The posterior lamina of L3 was then carefully thinned using a Leksell rongeur,  soft tissue debrided off the posterior elements of L3 and over the posterior  aspect and interspace at L2-3.  Bleeders were controlled again with bipolar  cautery.  A 3 mm Kerrison was used to debride and remove the remaining  portions of  the neural arch of L3, up to the L2-3 interspace.  Here, the  ligamentum flavum was also debrided at L2-3 as well as at L3-4.   Lateral recess decompression was carried out and the L4 nerve roots  identified and decompressed out into the neural foramina of L4 bilaterally.  Some remnants of the superior neural arch of L4 were remaining and these  were debrided and removed using a 3 mm Kerrison.  The residual portion of  the neural arch at L4 was felt to be a significant impediment to the canal  size and did appear to be causing significant stenosis at his level.  Following its debridement, the canal appeared to be widely decompressed at  the L3-4 and L2-3 levels.   The left-sided inferior articular process of L3 was resected using a Leksell  rongeur as well as 3 mm Kerrison.  The superior articular process of L4 on  the left side was partially osteotomized as well using a 3 mm Kerrison and  the L3 nerve root decompressed on this left side.  Bipolar electrocautery  used to cauterize small epidural veins over the left L3-4 disk space.   Careful exposure then obtained over the superior articular process of L3 on  the left side as well as the transverse process of L3.  Entry point for  placement of the first screw on the left side at L3 was obtained using a  ball, placing the initial entry point at the intersection of the transverse  process with the superior articular process of L3.  Then using a pediatric  pedicle probe in order to probe the pedicle at this level on the left side  and this was probed easily, the depth measured at 40 mm, tapping performed,  then using a 4.75  tap, which was felt to perform easily, then a 5.5 tap was  performed and the 5.5 screw placed after first decorticating the transverse  process here and also placing bone graft over the transverse process.  This  bone graft was harvested from the right iliac crest bone graft site which  was exposed through a separate incision on the right side earlier during the  patient's radiograph for localization.   A right iliac crest incision was made approximately 2-3 inches in length and  incision carried sharply down to the right posterosuperior iliac spine, a  Cobb used to elevate the gluteal muscle off the posterosuperior iliac spine  and over the posterior aspect of the iliac crest after incising.  Exposure  then obtained, both medial and lateral, osteotomes then used to remove  cortical and cancellous bone, gouges used to remove inner table cancellous  bone, enough for a single-level fusion with TLIFF.   This area then packed with thrombin-soaked Gelfoam and a sponge, bone graft placed over the transverse process, as noted.  First screw placed in the  left side at the L3 level after measuring for depth appropriately and  sounding using a ball-tipped probe, tapping and then again sounding with a  ball-tipped probe, 40 mm 5.5 screw was placed on the left side at L3.  Similarly, one was placed on the right side at L3.   Intraoperative C-arm fluoroscopy brought into the field.  AP view and  lateral views demonstrated these screws in excellent position and alignment.  The left L4 screw was placed with difficulty.  In fact, after first  performing pedicle finding at the intersection of the transverse process  with the superior articular process of  L4, tapping was performed using 4.75  then 5.5 tap.  Then placing a 6.25 screw, the medial portion of the pedicle  was noted to be broached.  Careful inspection of the L4 nerve root  demonstrated no abnormality, as well as no abnormality of the lateral  thecal  sac.  Irrigation was performed.  Some bone graft was placed within the  pedicle hole within the vertebral body on the left side at L4.  Debridement  of the small loose fragments of pedicle on the medial side was performed to  ensure that no remnants remained within the spinal canal that could cause  any nerve root compression.   Because of inability to place the pedicle screw at the L4 segment, the left  L4 transverse process was decorticated.  Bone graft was placed over the  posterolateral aspect of the transverse process of L4 and this extended to  the L3 transverse process.  Exposure was obtained of the left-side superior  articular process of L5 and the transverse process of L5 on the left side.  An adult pedicle probe was then easily passed into the pedicle at the  intersection of the transverse process or the superior articular process of  L5.  This was carefully probed, intraoperative C-arm fluoroscopy  demonstrating that in the lateral AP plane, this to be well-seated in  correct degree of convergence.  A 40 mm 6.25 screw was then placed into the  5 pedicle.  Additional bone graft was placed over the transverse process on  the left side at L5 and this extended up to the L4 and L3 level.  Bone graft  that had been made using the Symphony technique with platelet rich plasma  was also used.   On the right side, the pedicle at L4 was able to be probed using a pediatric  probe and then an adult size probe, measured for depth at 40 mm.  A 5.5  screw was placed on the right side at L4 after tapping and decorticating the  transverse process.  Bone graft that was Symphony as well as regular  autogenous and cortical and cancellous bone were placed over the right side  from the transverse process of L3 to L4.  With this completed then, the  first rod was placed on the right side measuring at 45 mm.  The rod was  distracted and the superior connection between the rod and fastener  was  screwed down and torqued to 100 foot pounds.  Then under distraction, the lower fastener was tightened carefully by hand.  The left side of the disk  space at the L3-4 level was then carefully exposed using D'Errico  retractors, bipolar electrocautery used to control bleeders, a 15 blade  scalpel used to incise the disk over the left side, removing a large window  of annular material.  Pituitary rongeur was used to debride the disk space.   The disk space was then sounded with an 8 mm sounder and this was felt to be  give an excellent fit and it was felt that no larger size could be placed.  Some thought was given to using a 7 mm cage; however, the 8 was felt to give  an adequate fit.  The endplates at the L3-4 level disk space were then  carefully curetted using the upbiting and downbiting curettes, as well as  the ring curettes.  When this was completed, sounding was performed using an  8-mm TLIFF Leopard trial.  This was  done without difficulty and showed good  position and alignment with excellent filling of the disk space.   The permanent prosthesis, the Leopard 8 mm prosthesis, was then filled with  cancellous bone graft that had been harvested previously, this connected to  the insertion device and then inserted into place, into the left-side disk  space with careful retraction of the thecal sac and nerve roots.  It was  then kicked across the midline and observed on C-arm fluoroscopy to be in  good position and alignment.  Note that prior to placement of the cage,  additional Symphony bone graft as well as cancellous bone graft were placed  within the intervertebral disk space to provide for further fusion mass  within the intervertebral disk space.   With the cage in good position and alignment, bleeders were controlled using  bipolar electrocautery, irrigation was performed, and Gelfoam, thrombin-  soaked, placed within the depths of the incision area.   The patient then  had a 65 mm rod placed into the pedicle screws on the left  side at L3 and L5.  The right-sided screws were then loosened over the L4  level, compression obtained across the pedicle screws/rod construct and then  the lower screw then tightened to 100 foot pounds.  Once this was completed,  the left-sided rod was placed under compression, the superior fastener  tightened to 100 foot pounds and then the lower rod fastener construct  tightened to 100 foot pounds, providing compression across the fusion site.  Additional remaining bone graft of Symphony as well as cancellous and  corticocancellous bone graft were then placed over the posterolateral aspect  fusion sites.  Careful inspection of the laminotomy site demonstrated both  L3 and L4 nerve roots to be well decompressed.  There did not appear to be  any significant bleeding evident at this point.   A medium Hemovac drain was placed in the depth of the incision, exiting over  the right side.  The right iliac crest bone graft harvest site was closed by first placing bone wax over the bleeding cancellous bone surfaces, then  Gelfoam.  Following irrigation, the fascial layer was closed with  interrupted #1 Vicryl sutures, deep subcutaneous layers with interrupted #0  and then  2-0 Vicryl sutures and the skin closed with a running subcutaneous  stitch of 4-0 Vicryl.  The lumbar incision was closed following irrigation,  placing a small portion of Gelfoam over the posterior aspect of the  laminotomy site, reapproximating the deep paralumbar muscles with loose  interrupted #1 Vicryl sutures, lumbodorsal fascia reapproximated in the  midline with interrupted #1 Vicryl sutures, deep subcutaneous layers with  interrupted #0 and then 2-0 Vicryl sutures, skin closed with a running  subcutaneous stitch of 4-0 Vicryl, tincture of Benzoin and Steri-Strips  applied, 4x4s and ABD pads affixed to the skin with Hypafix tape.   The patient then reactivated  and extubated and returned to the recovery room  in satisfactory condition.  All instrument and sponge counts were correct.  Note that the patient had Cell Saver blood of 650 mL estimated blood loss;  however, none was able to be returned to the patient.  The patient's C-arm  fluoroscopic views were obtained for documentation purposes.  Kerrin Champagne, M.D.    Myra Rude  D:  10/18/2003  T:  10/18/2003  Job:  [id number]

## 2011-07-16 ENCOUNTER — Telehealth: Payer: Self-pay

## 2011-07-16 NOTE — Telephone Encounter (Signed)
Fax refill request from [facility] for vicodin [phone], last written 04/13/11 #90 1RF last seen 03/02/11 Please advise

## 2011-07-17 MED ORDER — HYDROCODONE-ACETAMINOPHEN 5-500 MG PO TABS: 1.0000 | ORAL_TABLET | ORAL | Status: DC | PRN

## 2011-07-17 NOTE — Telephone Encounter (Signed)
#  90  RF 1

## 2011-07-27 ENCOUNTER — Telehealth: Payer: Self-pay

## 2011-07-27 NOTE — Telephone Encounter (Signed)
Opened in error

## 2011-07-29 ENCOUNTER — Other Ambulatory Visit: Payer: Self-pay

## 2011-07-29 MED ORDER — ALPRAZOLAM 2 MG PO TABS: 2.0000 mg | ORAL_TABLET | Freq: Three times a day (TID) | ORAL | Status: DC | PRN

## 2011-07-29 NOTE — Telephone Encounter (Signed)
[facility] to [facility] 

## 2011-08-20 ENCOUNTER — Ambulatory Visit (INDEPENDENT_AMBULATORY_CARE_PROVIDER_SITE_OTHER): Payer: PRIVATE HEALTH INSURANCE | Admitting: Internal Medicine

## 2011-08-20 ENCOUNTER — Encounter: Payer: Self-pay | Admitting: Internal Medicine

## 2011-08-20 DIAGNOSIS — M899 Disorder of bone, unspecified: Secondary | ICD-10-CM

## 2011-08-20 DIAGNOSIS — F329 Major depressive disorder, single episode, unspecified: Secondary | ICD-10-CM

## 2011-08-20 DIAGNOSIS — K219 Gastro-esophageal reflux disease without esophagitis: Secondary | ICD-10-CM

## 2011-08-20 DIAGNOSIS — E039 Hypothyroidism, unspecified: Secondary | ICD-10-CM

## 2011-08-20 DIAGNOSIS — M949 Disorder of cartilage, unspecified: Secondary | ICD-10-CM

## 2011-08-20 DIAGNOSIS — F3289 Other specified depressive episodes: Secondary | ICD-10-CM

## 2011-08-20 DIAGNOSIS — I10 Essential (primary) hypertension: Secondary | ICD-10-CM

## 2011-08-20 MED ORDER — FUROSEMIDE 40 MG PO TABS: ORAL_TABLET | ORAL | Status: DC

## 2011-08-20 MED ORDER — NABUMETONE 500 MG PO TABS: 500.0000 mg | ORAL_TABLET | Freq: Two times a day (BID) | ORAL | Status: DC

## 2011-08-20 MED ORDER — ALPRAZOLAM 2 MG PO TABS: 2.0000 mg | ORAL_TABLET | Freq: Three times a day (TID) | ORAL | Status: DC | PRN

## 2011-08-20 MED ORDER — SERTRALINE HCL 50 MG PO TABS: 50.0000 mg | ORAL_TABLET | Freq: Every day | ORAL | Status: DC

## 2011-08-20 MED ORDER — LEVOTHYROXINE SODIUM 75 MCG PO TABS: 75.0000 ug | ORAL_TABLET | Freq: Every day | ORAL | Status: DC

## 2011-08-20 MED ORDER — FUROSEMIDE 20 MG PO TABS: 20.0000 mg | ORAL_TABLET | Freq: Every day | ORAL | Status: DC

## 2011-08-20 MED ORDER — ALPRAZOLAM 2 MG PO TABS: ORAL_TABLET | ORAL | Status: DC

## 2011-08-20 NOTE — Progress Notes (Signed)
  Subjective:    Patient ID: April Brewer, female    DOB: 07-Feb-1946, 65 y.o.   MRN: [id number]  HPI  Wt Readings from Last 3 Encounters:  08/20/11 141 lb (63.957 kg)  03/02/11 164 lb (74.39 kg)  11/14/10 178 lb (80.60 kg)    65 year old patient who is in today for followup. She continues to lose weight on a voluntary basis and has done quite well. She has a history of treated hypertension controlled with diuretic therapy she has hypothyroidism. Last laboratory studies were approximately 8 months ago. She does have a history of osteopenia but no recent bone density study she has done quite well except for some mild arthritis pain. She does use Relafen as well as when necessary Vicodin  Review of Systems  Constitutional: Positive for unexpected weight change.  HENT: Negative for hearing loss, congestion, sore throat, rhinorrhea, dental problem, sinus pressure and tinnitus.   Eyes: Negative for pain, discharge and visual disturbance.  Respiratory: Negative for cough and shortness of breath.   Cardiovascular: Negative for chest pain, palpitations and leg swelling.  Gastrointestinal: Negative for nausea, vomiting, abdominal pain, diarrhea, constipation, blood in stool and abdominal distention.  Genitourinary: Negative for dysuria, urgency, frequency, hematuria, flank pain, vaginal bleeding, vaginal discharge, difficulty urinating, vaginal pain and pelvic pain.  Musculoskeletal: Positive for back pain and arthralgias. Negative for joint swelling and gait problem.  Skin: Negative for rash.  Neurological: Negative for dizziness, syncope, speech difficulty, weakness, numbness and headaches.  Hematological: Negative for adenopathy.  Psychiatric/Behavioral: Negative for behavioral problems, dysphoric mood and agitation. The patient is not nervous/anxious.        Objective:   Physical Exam  Constitutional: She is oriented to person, place, and time. She appears well-developed and  well-nourished.  HENT:  Head: Normocephalic.  Right Ear: External ear normal.  Left Ear: External ear normal.  Mouth/Throat: Oropharynx is clear and moist.  Eyes: Conjunctivae and EOM are normal. Pupils are equal, round, and reactive to light.  Neck: Normal range of motion. Neck supple. No thyromegaly present.  Cardiovascular: Normal rate, regular rhythm, normal heart sounds and intact distal pulses.   Pulmonary/Chest: Effort normal and breath sounds normal.  Abdominal: Soft. Bowel sounds are normal. She exhibits no mass. There is no tenderness.  Musculoskeletal: Normal range of motion.       Mild left ankle edema  Lymphadenopathy:    She has no cervical adenopathy.  Neurological: She is alert and oriented to person, place, and time.  Skin: Skin is warm and dry. No rash noted.  Psychiatric: She has a normal mood and affect. Her behavior is normal.          Assessment & Plan:    Voluntary weight loss Hypothyroidism Osteoarthritis History of osteopenia. Vitamin D and calcium supplements encouraged we'll follow up on a bone density study  CPX in 4 months

## 2011-08-20 NOTE — Patient Instructions (Signed)
Take a calcium supplement, plus [phone] units of vitamin D    It is important that you exercise regularly, at least 20 minutes 3 to 4 times per week.  If you develop chest pain or shortness of breath seek  medical attention.  Return in 4 months for follow-up

## 2011-08-24 ENCOUNTER — Ambulatory Visit (INDEPENDENT_AMBULATORY_CARE_PROVIDER_SITE_OTHER)
Admission: RE | Admit: 2011-08-24 | Discharge: 2011-08-24 | Disposition: A | Discharge status: Home / Self Care | Payer: PRIVATE HEALTH INSURANCE | Source: Ambulatory Visit | Attending: Internal Medicine | Admitting: Internal Medicine

## 2011-08-24 DIAGNOSIS — M899 Disorder of bone, unspecified: Secondary | ICD-10-CM

## 2011-09-21 LAB — CBC
MCHC: 34.6
RBC: 4.15
RDW: 13

## 2011-09-21 LAB — DIFFERENTIAL
Eosinophils Absolute: 0.2
Eosinophils Relative: 3
Lymphs Abs: 3.5
Monocytes Absolute: 1.1 — ABNORMAL HIGH
Monocytes Relative: 13 — ABNORMAL HIGH

## 2011-09-21 LAB — COMPREHENSIVE METABOLIC PANEL
ALT: 19
AST: 23
Calcium: 10.1
GFR calc Af Amer: >60
Potassium: 4.6
Sodium: 137
Total Protein: 8.4 — ABNORMAL HIGH

## 2011-09-22 ENCOUNTER — Other Ambulatory Visit: Payer: Self-pay | Admitting: Internal Medicine

## 2011-10-02 ENCOUNTER — Ambulatory Visit (INDEPENDENT_AMBULATORY_CARE_PROVIDER_SITE_OTHER): Payer: PRIVATE HEALTH INSURANCE

## 2011-10-02 DIAGNOSIS — Z23 Encounter for immunization: Secondary | ICD-10-CM

## 2011-10-15 ENCOUNTER — Other Ambulatory Visit: Payer: Self-pay | Admitting: Internal Medicine

## 2011-11-17 ENCOUNTER — Telehealth: Payer: Self-pay | Admitting: *Deleted

## 2011-11-17 NOTE — Telephone Encounter (Signed)
Pt states she has severe head and chest congesion for the past week.  She has been taking muccinex with no relief.  Clear watery drainage from nose.  Pt requesting an antibiotic sent to [facility] in [place].

## 2011-11-17 NOTE — Telephone Encounter (Signed)
attmept to call - no ans no mach - tried cell# - wrong #?  Will try tomorrow

## 2011-11-17 NOTE — Telephone Encounter (Signed)
Acute sinusitis symptoms for less than 10 days are generally not helped by antibiotic therapy.  Use saline irrigation, warm  moist compresses and over-the-counter decongestants only as directed.  Call if there is no improvement in 5 to 7 days, or sooner if you develop increasing pain, fever, or any new symptoms.  Afrin NS  Use twice daily as needed  Netti Pot (OTC)

## 2011-11-17 NOTE — Telephone Encounter (Signed)
Please advise 

## 2011-11-18 NOTE — Telephone Encounter (Signed)
Attempt to call again - no ans no mach

## 2011-12-16 ENCOUNTER — Encounter: Payer: Self-pay | Admitting: Internal Medicine

## 2011-12-16 ENCOUNTER — Ambulatory Visit (INDEPENDENT_AMBULATORY_CARE_PROVIDER_SITE_OTHER): Payer: PRIVATE HEALTH INSURANCE | Admitting: Internal Medicine

## 2011-12-16 DIAGNOSIS — Z0000 Encounter for general adult medical examination without abnormal findings: Secondary | ICD-10-CM

## 2011-12-16 DIAGNOSIS — E049 Nontoxic goiter, unspecified: Secondary | ICD-10-CM

## 2011-12-16 DIAGNOSIS — Z23 Encounter for immunization: Secondary | ICD-10-CM

## 2011-12-16 DIAGNOSIS — F3289 Other specified depressive episodes: Secondary | ICD-10-CM

## 2011-12-16 DIAGNOSIS — I10 Essential (primary) hypertension: Secondary | ICD-10-CM

## 2011-12-16 DIAGNOSIS — F329 Major depressive disorder, single episode, unspecified: Secondary | ICD-10-CM

## 2011-12-16 DIAGNOSIS — K219 Gastro-esophageal reflux disease without esophagitis: Secondary | ICD-10-CM

## 2011-12-16 DIAGNOSIS — J309 Allergic rhinitis, unspecified: Secondary | ICD-10-CM

## 2011-12-16 DIAGNOSIS — E039 Hypothyroidism, unspecified: Secondary | ICD-10-CM

## 2011-12-16 LAB — COMPREHENSIVE METABOLIC PANEL
ALT: 18 U/L (ref 0–35)
Alkaline Phosphatase: 83 U/L (ref 39–117)
CO2: 32 mEq/L (ref 19–32)
Creatinine, Ser: 0.7 mg/dL (ref 0.4–1.2)
GFR: 83.31 mL/min (ref >60.00)
Sodium: 138 mEq/L (ref 135–145)
Total Bilirubin: 0.7 mg/dL (ref 0.3–1.2)
Total Protein: 7.6 g/dL (ref 6.0–8.3)

## 2011-12-16 LAB — CBC WITH DIFFERENTIAL/PLATELET
Basophils Absolute: 0 10*3/uL (ref 0.0–0.1)
Eosinophils Relative: 2.6 % (ref 0.0–5.0)
HCT: 38.6 % (ref 36.0–46.0)
Hemoglobin: 13.2 g/dL (ref 12.0–15.0)
Lymphs Abs: 1.9 10*3/uL (ref 0.7–4.0)
MCV: 98.1 fl (ref 78.0–100.0)
Monocytes Absolute: 1.1 10*3/uL — ABNORMAL HIGH (ref 0.1–1.0)
Monocytes Relative: 15.8 % — ABNORMAL HIGH (ref 3.0–12.0)
Neutro Abs: 3.7 10*3/uL (ref 1.4–7.7)
Platelets: 258 10*3/uL (ref 150.0–400.0)
RDW: 13.8 % (ref 11.5–14.6)

## 2011-12-16 LAB — LIPID PANEL
Cholesterol: 243 mg/dL — ABNORMAL HIGH (ref 0–200)
HDL: 93.7 mg/dL (ref >39.00)
Total CHOL/HDL Ratio: 3
Triglycerides: 82 mg/dL (ref 0.0–149.0)
VLDL: 16.4 mg/dL (ref 0.0–40.0)

## 2011-12-16 MED ORDER — ALPRAZOLAM 2 MG PO TABS: ORAL_TABLET | ORAL | Status: DC

## 2011-12-16 MED ORDER — METHOCARBAMOL 500 MG PO TABS: 500.0000 mg | ORAL_TABLET | Freq: Three times a day (TID) | ORAL | Status: DC

## 2011-12-16 MED ORDER — FUROSEMIDE 20 MG PO TABS: 20.0000 mg | ORAL_TABLET | Freq: Every day | ORAL | Status: DC

## 2011-12-16 MED ORDER — NABUMETONE 500 MG PO TABS: 500.0000 mg | ORAL_TABLET | Freq: Two times a day (BID) | ORAL | Status: DC

## 2011-12-16 MED ORDER — SERTRALINE HCL 50 MG PO TABS: 50.0000 mg | ORAL_TABLET | Freq: Every day | ORAL | Status: DC

## 2011-12-16 MED ORDER — LEVOTHYROXINE SODIUM 75 MCG PO TABS: 75.0000 ug | ORAL_TABLET | Freq: Every day | ORAL | Status: DC

## 2011-12-16 NOTE — Progress Notes (Signed)
Subjective:    Patient ID: April Brewer, female    DOB: 04/21/1946, 65 y.o.   MRN: [id number]  HPI  History of Present Illness:   65 year old patient who is seen today for a wellness exam. Medical problems include the osteoarthritis. She has osteopenia hypothyroidism, and a history of mild hypertension. She has a history of gastroesophageal reflux disease controlled on diet and lifestyle and also remote history of depression. She is done well today without concerns or complaints   Preventive Screening-Counseling & Management  Alcohol-Tobacco  Smoking Status: never  Caffeine-Diet-Exercise  Does Patient Exercise: no   Allergies (verified):  No Known Drug Allergies   Past History:  Past Medical History:  Reviewed history from 02/18/2009 and no changes required.  Depression  GERD  Hypertension  Hypothyroidism, goiter  insomnia  Allergic rhinitis  cholelithiasis  Osteopenia  Low back pain   Past Surgical History:   Hysterectomy  Eye surgery  Back surgeries times 4  s/ lap chole '09  s/p EGD and colonoscopy '08  right shoulder surgery 12/ 2010 Ranell Patrick)   Family History:   Reviewed history from 01/05/2008 and no changes required.  father deceased unclear causes  mother died age 24 probable CAD  one brother history lung cancer  Two sisters positive for hypertension, thyroid disorder and diabetes breast ca   Social History:   Reviewed history and no changes required.  Married  Never Smoked  Regular exercise-no  2 sons  Does Patient Exercise: no   Past Medical History  Diagnosis Date  . ALLERGIC RHINITIS 06/02/2007  . DEPRESSION 06/02/2007  . GERD 06/02/2007  . GOITER 06/02/2007  . HYPERTENSION 06/02/2007  . HYPOTHYROIDISM 06/02/2007  . INSOMNIA 06/02/2007  . LOW BACK PAIN 02/18/2009  . OSTEOPENIA 02/18/2009    History   Social History  . Marital Status: Married    Spouse Name: N/A    Number of Children: N/A  . Years of Education: N/A   Occupational History  .  Not on file.   Social History Main Topics  . Smoking status: Never Smoker   . Smokeless tobacco: Never Used  . Alcohol Use: No  . Drug Use: No  . Sexually Active: Not on file   Other Topics Concern  . Not on file   Social History Narrative  . No narrative on file    Past Surgical History  Procedure Date  . Abdominal hysterectomy   . Eye surgery   . Cholecystectomy   . Back surgery   . Shoulder surgery     right    No family history on file.  No Known Allergies  Current Outpatient Prescriptions on File Prior to Visit  Medication Sig Dispense Refill  . alprazolam (XANAX) 2 MG tablet One half or one tablet twice daily as needed  180 tablet  1  . aspirin 81 MG EC tablet Take 81 mg by mouth daily.        . furosemide (LASIX) 20 MG tablet Take 1 tablet (20 mg total) by mouth daily.  30 tablet  11  . HYDROcodone-acetaminophen (VICODIN) 5-500 MG per tablet Take 1 tablet by mouth every 4 (four) hours as needed.  90 tablet  1  . levothyroxine (SYNTHROID, LEVOTHROID) 75 MCG tablet Take 1 tablet (75 mcg total) by mouth daily.  90 tablet  4  . methocarbamol (ROBAXIN) 500 MG tablet TAKE ONE TABLET BY MOUTH THREE TIMES DAILY  90 tablet  1  . nabumetone (RELAFEN) 500 MG tablet Take  1 tablet (500 mg total) by mouth 2 (two) times daily.  90 tablet  4  . sertraline (ZOLOFT) 50 MG tablet Take 1 tablet (50 mg total) by mouth daily.  90 tablet  5    BP 112/70  Pulse 74  Temp(Src) 97.8 F (36.6 C) (Oral)  Resp 16  Wt 138 lb (62.596 kg)  SpO2 97%     Review of Systems  Constitutional: Negative for fever, appetite change, fatigue and unexpected weight change.  HENT: Negative for hearing loss, ear pain, nosebleeds, congestion, sore throat, mouth sores, trouble swallowing, neck stiffness, dental problem, voice change, sinus pressure and tinnitus.   Eyes: Negative for photophobia, pain, redness and visual disturbance.  Respiratory: Negative for cough, chest tightness and shortness of  breath.   Cardiovascular: Negative for chest pain, palpitations and leg swelling.  Gastrointestinal: Negative for nausea, vomiting, abdominal pain, diarrhea, constipation, blood in stool, abdominal distention and rectal pain.  Genitourinary: Negative for dysuria, urgency, frequency, hematuria, flank pain, vaginal bleeding, vaginal discharge, difficulty urinating, genital sores, vaginal pain, menstrual problem and pelvic pain.  Musculoskeletal: Negative for back pain and arthralgias.  Skin: Negative for rash.  Neurological: Negative for dizziness, syncope, speech difficulty, weakness, light-headedness, numbness and headaches.  Hematological: Negative for adenopathy. Does not bruise/bleed easily.  Psychiatric/Behavioral: Negative for suicidal ideas, behavioral problems, self-injury, dysphoric mood and agitation. The patient is not nervous/anxious.        Objective:   Physical Exam  Constitutional: She is oriented to person, place, and time. She appears well-developed and well-nourished.  HENT:  Head: Normocephalic and atraumatic.  Right Ear: External ear normal.  Left Ear: External ear normal.  Mouth/Throat: Oropharynx is clear and moist.  Eyes: Conjunctivae and EOM are normal.  Neck: Normal range of motion. Neck supple. No JVD present. No thyromegaly present.  Cardiovascular: Normal rate, regular rhythm, normal heart sounds and intact distal pulses.   No murmur heard. Pulmonary/Chest: Effort normal and breath sounds normal. She has no wheezes. She has no rales.  Abdominal: Soft. Bowel sounds are normal. She exhibits no distension and no mass. There is no tenderness. There is no rebound and no guarding.  Genitourinary: Vagina normal. Guaiac negative stool.       S/p hysterectyomy  Musculoskeletal: Normal range of motion. She exhibits no edema and no tenderness.  Neurological: She is alert and oriented to person, place, and time. She has normal reflexes. No cranial nerve deficit. She  exhibits normal muscle tone. Coordination normal.  Skin: Skin is warm and dry. No rash noted.  Psychiatric: She has a normal mood and affect. Her behavior is normal.          Assessment & Plan:    Preventive health exam  Hypothyroidism. We'll check a TSH  Hypertension well controlled  Gastroesophageal reflux disease stable   Laboratory update will be reviewed.  Recheck 6 months

## 2011-12-16 NOTE — Patient Instructions (Signed)
Return in 6 months for follow-up  Limit your sodium (Salt) intake    It is important that you exercise regularly, at least 20 minutes 3 to 4 times per week.  If you develop chest pain or shortness of breath seek  medical attention.  Take a calcium supplement, plus [phone] units of vitamin D

## 2012-01-20 ENCOUNTER — Encounter: Payer: Self-pay | Admitting: Internal Medicine

## 2012-01-20 ENCOUNTER — Ambulatory Visit: Payer: PRIVATE HEALTH INSURANCE | Admitting: Internal Medicine

## 2012-01-20 ENCOUNTER — Ambulatory Visit (INDEPENDENT_AMBULATORY_CARE_PROVIDER_SITE_OTHER): Payer: PRIVATE HEALTH INSURANCE | Admitting: Internal Medicine

## 2012-01-20 DIAGNOSIS — I10 Essential (primary) hypertension: Secondary | ICD-10-CM

## 2012-01-20 DIAGNOSIS — E039 Hypothyroidism, unspecified: Secondary | ICD-10-CM

## 2012-01-20 MED ORDER — HYDROCODONE-HOMATROPINE 5-1.5 MG/5ML PO SYRP: 5.0000 mL | ORAL_SOLUTION | Freq: Four times a day (QID) | ORAL | Status: AC | PRN

## 2012-01-20 NOTE — Patient Instructions (Signed)
Get plenty of rest, Drink lots of  clear liquids, and use Tylenol or ibuprofen for fever and discomfort.    Call or return to clinic prn if these symptoms worsen or fail to improve as anticipated.  

## 2012-01-20 NOTE — Progress Notes (Signed)
  Subjective:    Patient ID: April Brewer, female    DOB: 1946/11/26, 66 y.o.   MRN: [id number]  HPI  Wt Readings from Last 3 Encounters:  01/20/12 142 lb (64.411 kg)  12/16/11 138 lb (62.596 kg)  08/20/11 141 lb (63.74 kg)     66 year old patient who is seen today for followup. For the past few days she has had some URI symptoms with head and chest congestion for the past 2 days she has had the small amount of hemoptysis. This has resolved over the past day. She is a lifelong nonsmoker. No fever chills wheezing or shortness of breath. Review of Systems  Constitutional: Negative.   HENT: Positive for congestion. Negative for hearing loss, sore throat, rhinorrhea, dental problem, sinus pressure and tinnitus.   Eyes: Negative for pain, discharge and visual disturbance.  Respiratory: Positive for cough. Negative for shortness of breath.   Cardiovascular: Negative for chest pain, palpitations and leg swelling.  Gastrointestinal: Negative for nausea, vomiting, abdominal pain, diarrhea, constipation, blood in stool and abdominal distention.  Genitourinary: Negative for dysuria, urgency, frequency, hematuria, flank pain, vaginal bleeding, vaginal discharge, difficulty urinating, vaginal pain and pelvic pain.  Musculoskeletal: Negative for joint swelling, arthralgias and gait problem.  Skin: Negative for rash.  Neurological: Negative for dizziness, syncope, speech difficulty, weakness, numbness and headaches.  Hematological: Negative for adenopathy.  Psychiatric/Behavioral: Negative for behavioral problems, dysphoric mood and agitation. The patient is not nervous/anxious.        Objective:   Physical Exam  Constitutional: She is oriented to person, place, and time. She appears well-developed and well-nourished.  HENT:  Head: Normocephalic.  Right Ear: External ear normal.  Left Ear: External ear normal.  Mouth/Throat: Oropharynx is clear and moist.  Eyes: Conjunctivae and EOM are  normal. Pupils are equal, round, and reactive to light.  Neck: Normal range of motion. Neck supple. No thyromegaly present.  Cardiovascular: Normal rate, regular rhythm, normal heart sounds and intact distal pulses.   Pulmonary/Chest: Effort normal and breath sounds normal.  Abdominal: Soft. Bowel sounds are normal. She exhibits no mass. There is no tenderness.  Musculoskeletal: Normal range of motion.  Lymphadenopathy:    She has no cervical adenopathy.  Neurological: She is alert and oriented to person, place, and time.  Skin: Skin is warm and dry. No rash noted.  Psychiatric: She has a normal mood and affect. Her behavior is normal.          Assessment & Plan:   Viral bronchitis with low-volume hemoptysis. This has resolved over the past 24 hours. We'll treat symptomatically and observe. If hemoptysis recurs we'll obtain a chest x-ray. Chest x-ray 2010 normal. Very low risk for malignancy Hypertension stable

## 2012-04-05 ENCOUNTER — Ambulatory Visit (INDEPENDENT_AMBULATORY_CARE_PROVIDER_SITE_OTHER): Payer: Medicare Other | Admitting: Internal Medicine

## 2012-04-05 ENCOUNTER — Encounter: Payer: Self-pay | Admitting: Internal Medicine

## 2012-04-05 VITALS — BP 130/80 | Temp 97.6°F | Wt 146.0 lb

## 2012-04-05 DIAGNOSIS — J309 Allergic rhinitis, unspecified: Secondary | ICD-10-CM

## 2012-04-05 DIAGNOSIS — I10 Essential (primary) hypertension: Secondary | ICD-10-CM

## 2012-04-05 MED ORDER — METHYLPREDNISOLONE ACETATE 80 MG/ML IJ SUSP
80.0000 mg | Freq: Once | INTRAMUSCULAR | Status: AC
  Administered 2012-04-05: 80 mg via INTRAMUSCULAR

## 2012-04-05 MED ORDER — FLUTICASONE PROPIONATE 50 MCG/ACT NA SUSP: 2.0000 | Freq: Every day | NASAL | Status: DC

## 2012-04-05 NOTE — Patient Instructions (Signed)
Limit your sodium (Salt) intake  Please check your blood pressure on a regular basis.  If it is consistently greater than 150/90, please make an office appointment.  Return in 6 months for follow-up   

## 2012-04-05 NOTE — Progress Notes (Signed)
  Subjective:    Patient ID: April Brewer, female    DOB: November 07, 1946, 66 y.o.   MRN: [id number]  HPI  66 year old patient who has a history of allergic rhinitis. She also has a history of hypertension and hypothyroidism. The past 3-4 days she has complained of head and chest congestion she has had some red itchy eyes cough and just a general sense of unwellness. She has been using Claritin    Review of Systems  Constitutional: Positive for fatigue.  HENT: Positive for nosebleeds, congestion, rhinorrhea and postnasal drip. Negative for hearing loss, sore throat, dental problem, sinus pressure and tinnitus.   Eyes: Negative for pain, discharge and visual disturbance.  Respiratory: Positive for cough. Negative for shortness of breath and wheezing.   Cardiovascular: Negative for chest pain, palpitations and leg swelling.  Gastrointestinal: Negative for nausea, vomiting, abdominal pain, diarrhea, constipation, blood in stool and abdominal distention.  Genitourinary: Negative for dysuria, urgency, frequency, hematuria, flank pain, vaginal bleeding, vaginal discharge, difficulty urinating, vaginal pain and pelvic pain.  Musculoskeletal: Negative for joint swelling, arthralgias and gait problem.  Skin: Negative for rash.  Neurological: Negative for dizziness, syncope, speech difficulty, weakness, numbness and headaches.  Hematological: Negative for adenopathy.  Psychiatric/Behavioral: Negative for behavioral problems, dysphoric mood and agitation. The patient is not nervous/anxious.        Objective:   Physical Exam  Constitutional: She is oriented to person, place, and time. She appears well-developed and well-nourished.  HENT:  Head: Normocephalic.  Right Ear: External ear normal.  Left Ear: External ear normal.  Mouth/Throat: Oropharynx is clear and moist.  Eyes: Conjunctivae and EOM are normal. Pupils are equal, round, and reactive to light.       Very mild conjunctival injection    Neck: Normal range of motion. Neck supple. No thyromegaly present.  Cardiovascular: Normal rate, regular rhythm, normal heart sounds and intact distal pulses.   Pulmonary/Chest: Effort normal and breath sounds normal.  Abdominal: Soft. Bowel sounds are normal. She exhibits no mass. There is no tenderness.  Musculoskeletal: Normal range of motion.  Lymphadenopathy:    She has no cervical adenopathy.  Neurological: She is alert and oriented to person, place, and time.  Skin: Skin is warm and dry. No rash noted.  Psychiatric: She has a normal mood and affect. Her behavior is normal.          Assessment & Plan:  Allergic rhinitis. We'll treat with Depo-Medrol 40. We'll continue Claritin and add Flonase nasal spray hypertension well controlled   Recheck 6 months or as needed

## 2012-04-28 ENCOUNTER — Encounter: Payer: Self-pay | Admitting: Internal Medicine

## 2012-05-11 ENCOUNTER — Other Ambulatory Visit: Payer: Self-pay

## 2012-05-11 MED ORDER — HYDROCODONE-HOMATROPINE 5-1.5 MG/5ML PO SYRP: 5.0000 mL | ORAL_SOLUTION | Freq: Three times a day (TID) | ORAL | Status: DC | PRN

## 2012-05-11 NOTE — Telephone Encounter (Signed)
Pt called requesting Rf on hycodan refill Last written 12/2011 120 ml  0RF Please advise

## 2012-05-11 NOTE — Telephone Encounter (Signed)
Ok 6 oz no rf

## 2012-05-19 ENCOUNTER — Telehealth: Payer: Self-pay

## 2012-05-19 NOTE — Telephone Encounter (Signed)
Will need another form faxed

## 2012-05-19 NOTE — Telephone Encounter (Signed)
Pt called and states she is scheduled for hip surgery with Dr. Lonni Fix on 05/31/2012.  Pt states she just seen Dr. Amador Cunas in 04/05/2012 and would like to know if she needs to make an ov.  Pt states a form should have been faxed over. Pls advise.

## 2012-05-19 NOTE — Telephone Encounter (Signed)
Lm to refax form.

## 2012-05-20 ENCOUNTER — Encounter (HOSPITAL_COMMUNITY): Payer: Self-pay | Admitting: Pharmacy Technician

## 2012-05-24 ENCOUNTER — Telehealth: Payer: Self-pay | Admitting: Internal Medicine

## 2012-05-24 NOTE — Telephone Encounter (Signed)
Pt aware - done today

## 2012-05-24 NOTE — Telephone Encounter (Signed)
Done today.

## 2012-05-24 NOTE — Telephone Encounter (Signed)
Pt is scheduled for hip replacement surgery on 05/31/12 and is needing to get surgical clearance for [facility] to do the surgery. Pt said that [facility] sent over a surgical clearance form a couple of times last week, but have not gotten a response.

## 2012-05-24 NOTE — Telephone Encounter (Signed)
Isaw form - do you recall doing?

## 2012-05-26 ENCOUNTER — Encounter (HOSPITAL_COMMUNITY): Payer: Self-pay

## 2012-05-26 ENCOUNTER — Encounter (HOSPITAL_COMMUNITY)
Admission: RE | Admit: 2012-05-26 | Discharge: 2012-05-26 | Disposition: A | Discharge status: Home / Self Care | Payer: Medicare Other | Source: Ambulatory Visit | Attending: Orthopedic Surgery | Admitting: Orthopedic Surgery

## 2012-05-26 HISTORY — DX: Unspecified osteoarthritis, unspecified site: M19.90

## 2012-05-26 LAB — CBC
HCT: 38.9 % (ref 36.0–46.0)
Hemoglobin: 13.4 g/dL (ref 12.0–15.0)
MCH: 32.3 pg (ref 26.0–34.0)
MCHC: 34.4 g/dL (ref 30.0–36.0)
MCV: 93.7 fL (ref 78.0–100.0)
Platelets: 301 10*3/uL (ref 150–400)
RBC: 4.15 MIL/uL (ref 3.87–5.11)
RDW: 13.3 % (ref 11.5–15.5)
WBC: 7.5 10*3/uL (ref 4.0–10.5)

## 2012-05-26 LAB — URINALYSIS, ROUTINE W REFLEX MICROSCOPIC
Bilirubin Urine: NEGATIVE
Glucose, UA: NEGATIVE mg/dL
Hgb urine dipstick: NEGATIVE
Ketones, ur: NEGATIVE mg/dL
Leukocytes, UA: NEGATIVE
Nitrite: NEGATIVE
Protein, ur: NEGATIVE mg/dL
Specific Gravity, Urine: 1.011 (ref 1.005–1.030)
Urobilinogen, UA: 0.2 mg/dL (ref 0.0–1.0)
pH: 6.5 (ref 5.0–8.0)

## 2012-05-26 LAB — BASIC METABOLIC PANEL
CO2: 30 mEq/L (ref 19–32)
Chloride: 94 mEq/L — ABNORMAL LOW (ref 96–112)
GFR calc Af Amer: >90 mL/min (ref >90)
Potassium: 3.8 mEq/L (ref 3.5–5.1)
Sodium: 134 mEq/L — ABNORMAL LOW (ref 135–145)

## 2012-05-26 LAB — DIFFERENTIAL
Basophils Absolute: 0 10*3/uL (ref 0.0–0.1)
Basophils Relative: 0 % (ref 0–1)
Eosinophils Absolute: 0.1 10*3/uL (ref 0.0–0.7)
Eosinophils Relative: 2 % (ref 0–5)
Lymphocytes Relative: 32 % (ref 12–46)
Lymphs Abs: 2.4 10*3/uL (ref 0.7–4.0)
Monocytes Absolute: 0.7 10*3/uL (ref 0.1–1.0)
Monocytes Relative: 10 % (ref 3–12)
Neutro Abs: 4.2 10*3/uL (ref 1.7–7.7)
Neutrophils Relative %: 56 % (ref 43–77)

## 2012-05-26 LAB — SURGICAL PCR SCREEN
MRSA, PCR: NEGATIVE
Staphylococcus aureus: POSITIVE — AB

## 2012-05-26 LAB — PROTIME-INR
INR: 0.94 (ref 0.00–1.49)
Prothrombin Time: 12.8 seconds (ref 11.6–15.2)

## 2012-05-26 LAB — APTT: aPTT: 37 seconds (ref 24–37)

## 2012-05-26 MED ORDER — CHLORHEXIDINE GLUCONATE 4 % EX LIQD
60.0000 mL | Freq: Once | CUTANEOUS | Status: DC
  Filled 2012-05-26: qty 60

## 2012-05-26 NOTE — Patient Instructions (Addendum)
20 DAVINITY FANARA  05/26/2012   Your procedure is scheduled on:  05/31/12  Report to SHORT STAY DEPT  at 8:00 AM.  Call this number if you have problems the morning of surgery: [phone]   Remember:   Do not eat food or drink liquids AFTER MIDNIGHT    Take these medicines the morning of surgery with A SIP OF WATER: LEVOTHYROXINE / XANAX,METHOCARBAMOL, ZOLOFT, OXYDODONE IF NEEDED   Do not wear jewelry, make-up or nail polish.  Do not wear lotions, powders, or perfumes.   Do not shave legs or underarms 48 hrs. before surgery (men may shave face)  Do not bring valuables to the hospital.  Contacts, dentures or bridgework may not be worn into surgery.  Leave suitcase in the car. After surgery it may be brought to your room.  For patients admitted to the hospital, checkout time is 11:00 AM the day of discharge.   Patients discharged the day of surgery will not be allowed to drive home.    Special Instructions:   Please read over the following fact sheets that you were given: MRSA  Information /  Incentive Spirometer               SHOWER WITH BETASEPT THE NIGHT BEFORE SURGERY AND THE MORNING OF SURGERY

## 2012-05-30 NOTE — H&P (Signed)
April Brewer is an 66 y.o. female.    Chief Complaint: Left hip OA and pain   HPI: Pt is a 66 y.o. female complaining of left hip pain for 2+ years. Pain had continually increased since the beginning. X-rays in the clinic show end-stage arthritic changes of the left hip. Pt has tried various conservative treatments which have failed to alleviate their symptoms, including NSAIDs. Various options are discussed with the patient. Risks, benefits and expectations were discussed with the patient. Patient understand the risks, benefits and expectations and wishes to proceed with surgery.   PCP:  Rogelia Boga, MD, MD  D/C Plans:  Home with HHPT  Post-op Meds:  Rx given for ASA, Robaxin, Iron, Colace and MiraLax  Tranexamic Acid:   To be given  Decadron:   To be given  PMH: Past Medical History  Diagnosis Date  . ALLERGIC RHINITIS 06/02/2007  . DEPRESSION 06/02/2007  . GOITER 06/02/2007  . INSOMNIA 06/02/2007  . LOW BACK PAIN 02/18/2009  . OSTEOPENIA 02/18/2009  . Arthritis   . HYPOTHYROIDISM 06/02/2007    GOITER  . Swelling     PSH: Past Surgical History  Procedure Date  . Abdominal hysterectomy   . Eye surgery   . Cholecystectomy   . Shoulder surgery     right  . Back surgery     X 5    Social History:  reports that she has never smoked. She has never used smokeless tobacco. She reports that she does not drink alcohol or use illicit drugs.  Allergies:  No Known Allergies  Medications: No current facility-administered medications for this encounter.   Current Outpatient Prescriptions  Medication Sig Dispense Refill  . alprazolam (XANAX) 2 MG tablet Take 2 mg by mouth 2 (two) times daily as needed. One half or one tablet twice daily as needed      . b complex vitamins tablet Take 1 tablet by mouth daily.      . fish oil-omega-3 fatty acids 1000 MG capsule Take 1 g by mouth 2 (two) times a week.      . fluticasone (FLONASE) 50 MCG/ACT nasal spray Place 2 sprays into  the nose daily as needed. For allergies       . furosemide (LASIX) 20 MG tablet Take 20 mg by mouth daily with breakfast.      . levothyroxine (SYNTHROID, LEVOTHROID) 75 MCG tablet Take 75 mcg by mouth daily with breakfast.      . methocarbamol (ROBAXIN) 500 MG tablet Take 500 mg by mouth 3 (three) times daily as needed. For muscle spasms      . Multiple Vitamin (MULITIVITAMIN WITH MINERALS) TABS Take 1 tablet by mouth daily.      . nabumetone (RELAFEN) 500 MG tablet Take 1 tablet (500 mg total) by mouth 2 (two) times daily.  90 tablet  4  . oxyCODONE (OXY IR/ROXICODONE) 5 MG immediate release tablet Take 5 mg by mouth every 8 (eight) hours as needed. For pain      . sertraline (ZOLOFT) 50 MG tablet Take 50 mg by mouth daily with breakfast.      . vitamin C (ASCORBIC ACID) 500 MG tablet Take 500 mg by mouth daily.      . vitamin E 400 UNIT capsule Take 400 Units by mouth daily.        ROS: Review of Systems  Constitutional: Negative.   HENT: Negative.   Eyes: Negative.   Respiratory: Negative.   Cardiovascular: Negative.  Gastrointestinal: Negative.   Genitourinary: Negative.   Musculoskeletal: Positive for joint pain (left hip).  Skin: Negative.   Neurological: Positive for sensory change (decreased sensation of the bottom of her feet).  Endo/Heme/Allergies: Negative.   Psychiatric/Behavioral: Negative.      Physical Exam: Physical Exam  Constitutional: She is oriented to person, place, and time and well-developed, well-nourished, and in no distress.  HENT:  Head: Normocephalic and atraumatic.  Nose: Nose normal.  Mouth/Throat: Oropharynx is clear and moist.  Eyes: Pupils are equal, round, and reactive to light.  Neck: Neck supple. No JVD present. No tracheal deviation present. No thyromegaly present.  Cardiovascular: Normal rate, regular rhythm, normal heart sounds and intact distal pulses.   Pulmonary/Chest: Effort normal and breath sounds normal. No respiratory distress.  She has no wheezes. She exhibits no tenderness.  Abdominal: Soft. There is no tenderness. There is no guarding.  Musculoskeletal:       Left hip: She exhibits decreased range of motion, decreased strength, tenderness and bony tenderness. She exhibits no swelling, no crepitus, no deformity and no laceration.  Lymphadenopathy:    She has no cervical adenopathy.  Neurological: She is alert and oriented to person, place, and time.       Decreased sensation on the soles of each foot.  Skin: Skin is warm and dry.  Psychiatric: Affect normal.     Assessment/Plan Assessment: Left hip OA and pain   Plan: Patient will undergo a left total hip arthroplasty, anterior approach on 05/31/2012 per Dr. Charlann Boxer at [facility]. Risks benefits and expectation were discussed with the patient. Patient understand risks, benefits and expectation and wishes to proceed.   Anastasio Auerbach April Brewer   PAC  05/30/2012, 7:56 AM

## 2012-05-31 ENCOUNTER — Ambulatory Visit (HOSPITAL_COMMUNITY): Payer: Medicare Other | Admitting: Anesthesiology

## 2012-05-31 ENCOUNTER — Inpatient Hospital Stay (HOSPITAL_COMMUNITY)
Admission: RE | Admit: 2012-05-31 | Discharge: 2012-06-02 | DRG: 470 | Disposition: A | Discharge status: Home Health | Payer: Medicare Other | Source: Ambulatory Visit | Attending: Orthopedic Surgery | Admitting: Orthopedic Surgery

## 2012-05-31 ENCOUNTER — Ambulatory Visit (HOSPITAL_COMMUNITY): Payer: Medicare Other

## 2012-05-31 ENCOUNTER — Encounter (HOSPITAL_COMMUNITY): Payer: Self-pay | Admitting: Anesthesiology

## 2012-05-31 ENCOUNTER — Encounter (HOSPITAL_COMMUNITY)
Admission: RE | Disposition: A | Discharge status: Home Health | Payer: Self-pay | Source: Ambulatory Visit | Attending: Orthopedic Surgery

## 2012-05-31 ENCOUNTER — Encounter (HOSPITAL_COMMUNITY): Payer: Self-pay | Admitting: *Deleted

## 2012-05-31 DIAGNOSIS — M1610 Unilateral primary osteoarthritis, unspecified hip: Principal | ICD-10-CM | POA: Diagnosis present

## 2012-05-31 DIAGNOSIS — M169 Osteoarthritis of hip, unspecified: Principal | ICD-10-CM | POA: Diagnosis present

## 2012-05-31 DIAGNOSIS — Z01812 Encounter for preprocedural laboratory examination: Secondary | ICD-10-CM

## 2012-05-31 DIAGNOSIS — E039 Hypothyroidism, unspecified: Secondary | ICD-10-CM | POA: Diagnosis present

## 2012-05-31 DIAGNOSIS — I10 Essential (primary) hypertension: Secondary | ICD-10-CM | POA: Diagnosis present

## 2012-05-31 DIAGNOSIS — Z96649 Presence of unspecified artificial hip joint: Secondary | ICD-10-CM

## 2012-05-31 HISTORY — PX: TOTAL HIP ARTHROPLASTY: SHX124

## 2012-05-31 LAB — TYPE AND SCREEN: ABO/RH(D): O POS

## 2012-05-31 LAB — ABO/RH: ABO/RH(D): O POS

## 2012-05-31 SURGERY — ARTHROPLASTY, HIP, TOTAL, ANTERIOR APPROACH
Anesthesia: General | Site: Hip | Laterality: Left | Wound class: Clean

## 2012-05-31 MED ORDER — NEOSTIGMINE METHYLSULFATE 1 MG/ML IJ SOLN
INTRAMUSCULAR | Status: DC | PRN
  Administered 2012-05-31: 4 mg via INTRAVENOUS

## 2012-05-31 MED ORDER — 0.9 % SODIUM CHLORIDE (POUR BTL) OPTIME
TOPICAL | Status: DC | PRN
  Administered 2012-05-31: 1000 mL

## 2012-05-31 MED ORDER — CEFAZOLIN SODIUM 1-5 GM-% IV SOLN
INTRAVENOUS | Status: AC
  Filled 2012-05-31: qty 50

## 2012-05-31 MED ORDER — ONDANSETRON HCL 4 MG/2ML IJ SOLN
INTRAMUSCULAR | Status: DC | PRN
  Administered 2012-05-31: 4 mg via INTRAVENOUS

## 2012-05-31 MED ORDER — ACETAMINOPHEN 10 MG/ML IV SOLN
INTRAVENOUS | Status: DC | PRN
  Administered 2012-05-31: 1000 mg via INTRAVENOUS

## 2012-05-31 MED ORDER — GLYCOPYRROLATE 0.2 MG/ML IJ SOLN
INTRAMUSCULAR | Status: DC | PRN
  Administered 2012-05-31: .6 mg via INTRAVENOUS

## 2012-05-31 MED ORDER — ONDANSETRON HCL 4 MG/2ML IJ SOLN: 4.0000 mg | Freq: Four times a day (QID) | INTRAMUSCULAR | Status: DC | PRN

## 2012-05-31 MED ORDER — ALUM & MAG HYDROXIDE-SIMETH 200-200-20 MG/5ML PO SUSP: 30.0000 mL | ORAL | Status: DC | PRN

## 2012-05-31 MED ORDER — LIDOCAINE HCL (CARDIAC) 20 MG/ML IV SOLN
INTRAVENOUS | Status: DC | PRN
  Administered 2012-05-31: 80 mg via INTRAVENOUS

## 2012-05-31 MED ORDER — ONDANSETRON HCL 4 MG PO TABS: 4.0000 mg | ORAL_TABLET | Freq: Four times a day (QID) | ORAL | Status: DC | PRN

## 2012-05-31 MED ORDER — HYDROMORPHONE HCL PF 1 MG/ML IJ SOLN
0.2500 mg | INTRAMUSCULAR | Status: DC | PRN
  Administered 2012-05-31: 0.5 mg via INTRAVENOUS
  Administered 2012-05-31 (×2): 0.25 mg via INTRAVENOUS
  Administered 2012-05-31: 0.5 mg via INTRAVENOUS

## 2012-05-31 MED ORDER — METHOCARBAMOL 100 MG/ML IJ SOLN
500.0000 mg | Freq: Four times a day (QID) | INTRAVENOUS | Status: DC | PRN
  Administered 2012-05-31: 500 mg via INTRAVENOUS
  Filled 2012-05-31: qty 5

## 2012-05-31 MED ORDER — POLYETHYLENE GLYCOL 3350 17 G PO PACK: 17.0000 g | PACK | Freq: Every day | ORAL | Status: DC | PRN

## 2012-05-31 MED ORDER — DOCUSATE SODIUM 100 MG PO CAPS
100.0000 mg | ORAL_CAPSULE | Freq: Two times a day (BID) | ORAL | Status: DC
  Administered 2012-05-31 – 2012-06-02 (×4): 100 mg via ORAL

## 2012-05-31 MED ORDER — ZOLPIDEM TARTRATE 5 MG PO TABS: 5.0000 mg | ORAL_TABLET | Freq: Every evening | ORAL | Status: DC | PRN

## 2012-05-31 MED ORDER — HYDROMORPHONE HCL PF 1 MG/ML IJ SOLN
INTRAMUSCULAR | Status: AC
  Filled 2012-05-31: qty 1

## 2012-05-31 MED ORDER — FUROSEMIDE 20 MG PO TABS
20.0000 mg | ORAL_TABLET | Freq: Every day | ORAL | Status: DC
  Administered 2012-06-01 – 2012-06-02 (×2): 20 mg via ORAL
  Filled 2012-05-31 (×3): qty 1

## 2012-05-31 MED ORDER — HYDROMORPHONE HCL PF 1 MG/ML IJ SOLN: 0.2000 mg | INTRAMUSCULAR | Status: DC | PRN

## 2012-05-31 MED ORDER — METHOCARBAMOL 500 MG PO TABS
500.0000 mg | ORAL_TABLET | Freq: Four times a day (QID) | ORAL | Status: DC | PRN
  Administered 2012-05-31 – 2012-06-02 (×4): 500 mg via ORAL
  Filled 2012-05-31 (×5): qty 1

## 2012-05-31 MED ORDER — TRANEXAMIC ACID 100 MG/ML IV SOLN
970.0000 mg | Freq: Once | INTRAVENOUS | Status: AC
  Administered 2012-05-31: 970 mg via INTRAVENOUS
  Filled 2012-05-31: qty 9.7

## 2012-05-31 MED ORDER — PROMETHAZINE HCL 25 MG/ML IJ SOLN: 6.2500 mg | INTRAMUSCULAR | Status: DC | PRN

## 2012-05-31 MED ORDER — HYDROCODONE-ACETAMINOPHEN 7.5-325 MG PO TABS
1.0000 | ORAL_TABLET | ORAL | Status: DC
  Administered 2012-05-31: 2 via ORAL
  Administered 2012-05-31: 1 via ORAL
  Administered 2012-05-31: 2 via ORAL
  Administered 2012-06-01: 1 via ORAL
  Administered 2012-06-01 – 2012-06-02 (×7): 2 via ORAL
  Filled 2012-05-31 (×5): qty 2
  Filled 2012-05-31: qty 1
  Filled 2012-05-31 (×5): qty 2

## 2012-05-31 MED ORDER — ALPRAZOLAM 1 MG PO TABS
2.0000 mg | ORAL_TABLET | Freq: Two times a day (BID) | ORAL | Status: DC | PRN
Start: 2012-05-31 — End: 2012-06-02
  Administered 2012-06-01: 2 mg via ORAL
  Filled 2012-05-31 (×2): qty 1

## 2012-05-31 MED ORDER — FENTANYL CITRATE 0.05 MG/ML IJ SOLN
INTRAMUSCULAR | Status: DC | PRN
  Administered 2012-05-31 (×2): 50 ug via INTRAVENOUS
  Administered 2012-05-31: 100 ug via INTRAVENOUS
  Administered 2012-05-31: 50 ug via INTRAVENOUS

## 2012-05-31 MED ORDER — SENNA 8.6 MG PO TABS
1.0000 | ORAL_TABLET | Freq: Two times a day (BID) | ORAL | Status: DC
  Administered 2012-05-31 – 2012-06-02 (×4): 8.6 mg via ORAL
  Filled 2012-05-31 (×6): qty 1

## 2012-05-31 MED ORDER — RIVAROXABAN 10 MG PO TABS
10.0000 mg | ORAL_TABLET | Freq: Every day | ORAL | Status: DC
  Administered 2012-06-01 – 2012-06-02 (×2): 10 mg via ORAL
  Filled 2012-05-31 (×3): qty 1

## 2012-05-31 MED ORDER — FLUTICASONE PROPIONATE 50 MCG/ACT NA SUSP
2.0000 | Freq: Every day | NASAL | Status: DC | PRN
  Filled 2012-05-31: qty 16

## 2012-05-31 MED ORDER — PHENOL 1.4 % MT LIQD
1.0000 | OROMUCOSAL | Status: DC | PRN
  Filled 2012-05-31: qty 177

## 2012-05-31 MED ORDER — FERROUS SULFATE 325 (65 FE) MG PO TABS
325.0000 mg | ORAL_TABLET | Freq: Three times a day (TID) | ORAL | Status: DC
  Administered 2012-05-31 – 2012-06-02 (×7): 325 mg via ORAL
  Filled 2012-05-31 (×8): qty 1

## 2012-05-31 MED ORDER — LACTATED RINGERS IV SOLN
INTRAVENOUS | Status: DC
  Administered 2012-05-31: 12:00:00 via INTRAVENOUS
  Administered 2012-05-31: 1000 mL via INTRAVENOUS

## 2012-05-31 MED ORDER — MIDAZOLAM HCL 5 MG/5ML IJ SOLN
INTRAMUSCULAR | Status: DC | PRN
  Administered 2012-05-31 (×2): 1 mg via INTRAVENOUS

## 2012-05-31 MED ORDER — CEFAZOLIN SODIUM 1-5 GM-% IV SOLN
1.0000 g | INTRAVENOUS | Status: AC
  Administered 2012-05-31: 1 g via INTRAVENOUS

## 2012-05-31 MED ORDER — ACETAMINOPHEN 10 MG/ML IV SOLN
INTRAVENOUS | Status: AC
  Filled 2012-05-31: qty 100

## 2012-05-31 MED ORDER — DIPHENHYDRAMINE HCL 12.5 MG/5ML PO ELIX: 25.0000 mg | ORAL_SOLUTION | Freq: Four times a day (QID) | ORAL | Status: DC | PRN

## 2012-05-31 MED ORDER — LEVOTHYROXINE SODIUM 75 MCG PO TABS
75.0000 ug | ORAL_TABLET | Freq: Every day | ORAL | Status: DC
  Administered 2012-06-01 – 2012-06-02 (×2): 75 ug via ORAL
  Filled 2012-05-31 (×3): qty 1

## 2012-05-31 MED ORDER — SERTRALINE HCL 50 MG PO TABS
50.0000 mg | ORAL_TABLET | Freq: Every day | ORAL | Status: DC
  Administered 2012-06-01 – 2012-06-02 (×2): 50 mg via ORAL
  Filled 2012-05-31 (×2): qty 1

## 2012-05-31 MED ORDER — ROCURONIUM BROMIDE 100 MG/10ML IV SOLN
INTRAVENOUS | Status: DC | PRN
  Administered 2012-05-31: 40 mg via INTRAVENOUS
  Administered 2012-05-31: 10 mg via INTRAVENOUS

## 2012-05-31 MED ORDER — PROPOFOL 10 MG/ML IV BOLUS
INTRAVENOUS | Status: DC | PRN
  Administered 2012-05-31: 160 mg via INTRAVENOUS

## 2012-05-31 MED ORDER — DEXAMETHASONE SODIUM PHOSPHATE 10 MG/ML IJ SOLN: 10.0000 mg | Freq: Once | INTRAMUSCULAR | Status: DC

## 2012-05-31 MED ORDER — SODIUM CHLORIDE 0.9 % IV SOLN
INTRAVENOUS | Status: AC
  Administered 2012-05-31 (×2): via INTRAVENOUS
  Filled 2012-05-31 (×7): qty 1000

## 2012-05-31 MED ORDER — MENTHOL 3 MG MT LOZG
1.0000 | LOZENGE | OROMUCOSAL | Status: DC | PRN
  Filled 2012-05-31: qty 9

## 2012-05-31 MED ORDER — CEFAZOLIN SODIUM 1-5 GM-% IV SOLN
1.0000 g | Freq: Four times a day (QID) | INTRAVENOUS | Status: AC
  Administered 2012-05-31 – 2012-06-01 (×3): 1 g via INTRAVENOUS
  Filled 2012-05-31 (×3): qty 50

## 2012-05-31 MED ORDER — DEXAMETHASONE SODIUM PHOSPHATE 10 MG/ML IJ SOLN
INTRAMUSCULAR | Status: DC | PRN
  Administered 2012-05-31: 10 mg via INTRAVENOUS

## 2012-05-31 SURGICAL SUPPLY — 41 items
ADH SKN CLS APL DERMABOND .7 (GAUZE/BANDAGES/DRESSINGS) ×1
BAG SPEC THK2 15X12 ZIP CLS (MISCELLANEOUS) ×2
BAG ZIPLOCK 12X15 (MISCELLANEOUS) ×4 IMPLANT
BLADE SAW SGTL 18X1.27X75 (BLADE) ×2 IMPLANT
CELLS DAT CNTRL 66122 CELL SVR (MISCELLANEOUS) ×1 IMPLANT
CLOTH BEACON ORANGE TIMEOUT ST (SAFETY) ×2 IMPLANT
DERMABOND ADVANCED (GAUZE/BANDAGES/DRESSINGS) ×1
DERMABOND ADVANCED .7 DNX12 (GAUZE/BANDAGES/DRESSINGS) ×1 IMPLANT
DRAPE C-ARM 42X72 X-RAY (DRAPES) ×2 IMPLANT
DRAPE STERI IOBAN 125X83 (DRAPES) ×2 IMPLANT
DRAPE U-SHAPE 47X51 STRL (DRAPES) ×6 IMPLANT
DRSG AQUACEL AG ADV 3.5X10 (GAUZE/BANDAGES/DRESSINGS) ×2 IMPLANT
DRSG TEGADERM 4X4.75 (GAUZE/BANDAGES/DRESSINGS) ×1 IMPLANT
DURAPREP 26ML APPLICATOR (WOUND CARE) ×2 IMPLANT
ELECT BLADE TIP CTD 4 INCH (ELECTRODE) ×2 IMPLANT
ELECT REM PT RETURN 9FT ADLT (ELECTROSURGICAL) ×2
ELECTRODE REM PT RTRN 9FT ADLT (ELECTROSURGICAL) ×1 IMPLANT
EVACUATOR 1/8 PVC DRAIN (DRAIN) IMPLANT
FACESHIELD LNG OPTICON STERILE (SAFETY) ×8 IMPLANT
GAUZE SPONGE 2X2 8PLY STRL LF (GAUZE/BANDAGES/DRESSINGS) ×1 IMPLANT
GLOVE BIOGEL PI IND STRL 7.5 (GLOVE) ×1 IMPLANT
GLOVE BIOGEL PI IND STRL 8 (GLOVE) ×1 IMPLANT
GLOVE BIOGEL PI INDICATOR 7.5 (GLOVE) ×1
GLOVE BIOGEL PI INDICATOR 8 (GLOVE) ×1
GLOVE ECLIPSE 8.0 STRL XLNG CF (GLOVE) ×2 IMPLANT
GLOVE ORTHO TXT STRL SZ7.5 (GLOVE) ×4 IMPLANT
GOWN BRE IMP PREV XXLGXLNG (GOWN DISPOSABLE) ×4 IMPLANT
GOWN STRL NON-REIN LRG LVL3 (GOWN DISPOSABLE) ×2 IMPLANT
KIT BASIN OR (CUSTOM PROCEDURE TRAY) ×2 IMPLANT
PACK TOTAL JOINT (CUSTOM PROCEDURE TRAY) ×2 IMPLANT
PADDING CAST COTTON 6X4 STRL (CAST SUPPLIES) ×2 IMPLANT
RETRACTOR WND ALEXIS 18 MED (MISCELLANEOUS) ×1 IMPLANT
RTRCTR WOUND ALEXIS 18CM MED (MISCELLANEOUS) ×2
SPONGE GAUZE 2X2 STER 10/PKG (GAUZE/BANDAGES/DRESSINGS) ×1
SUCTION FRAZIER 12FR DISP (SUCTIONS) ×2 IMPLANT
SUT MNCRL AB 4-0 PS2 18 (SUTURE) ×2 IMPLANT
SUT VIC AB 1 CT1 36 (SUTURE) ×8 IMPLANT
SUT VIC AB 2-0 CT1 27 (SUTURE) ×4
SUT VIC AB 2-0 CT1 TAPERPNT 27 (SUTURE) ×2 IMPLANT
TOWEL OR 17X26 10 PK STRL BLUE (TOWEL DISPOSABLE) ×4 IMPLANT
TRAY FOLEY CATH 14FRSI W/METER (CATHETERS) ×2 IMPLANT

## 2012-05-31 NOTE — Progress Notes (Signed)
Portable AP PELVIS and Lateral Left Hip X-rays done. 

## 2012-05-31 NOTE — Progress Notes (Signed)
Patient had left foot and leg numbness pre-op- this has been since 1996- right foot and leg as well- can tell they are being touched "JUST A LITTLE".

## 2012-05-31 NOTE — Anesthesia Preprocedure Evaluation (Signed)
Anesthesia Evaluation  Patient identified by MRN, date of birth, ID band Patient awake    Reviewed: Allergy & Precautions, H&P , NPO status , Patient's Chart, lab work & pertinent test results  Airway Mallampati: II TM Distance: <3 FB Neck ROM: Full    Dental No notable dental hx.    Pulmonary neg pulmonary ROS,  breath sounds clear to auscultation  Pulmonary exam normal       Cardiovascular hypertension, Pt. on medications Rhythm:Regular Rate:Normal     Neuro/Psych negative neurological ROS  negative psych ROS   GI/Hepatic negative GI ROS, Neg liver ROS,   Endo/Other  Hypothyroidism   Renal/GU negative Renal ROS  negative genitourinary   Musculoskeletal negative musculoskeletal ROS (+)   Abdominal   Peds negative pediatric ROS (+)  Hematology negative hematology ROS (+)   Anesthesia Other Findings   Reproductive/Obstetrics negative OB ROS                           Anesthesia Physical Anesthesia Plan  ASA: II  Anesthesia Plan: General   Post-op Pain Management:    Induction: Intravenous  Airway Management Planned: Oral ETT  Additional Equipment:   Intra-op Plan:   Post-operative Plan: Extubation in OR  Informed Consent: I have reviewed the patients History and Physical, chart, labs and discussed the procedure including the risks, benefits and alternatives for the proposed anesthesia with the patient or authorized representative who has indicated his/her understanding and acceptance.   Dental advisory given  Plan Discussed with: CRNA  Anesthesia Plan Comments:         Anesthesia Quick Evaluation

## 2012-05-31 NOTE — Interval H&P Note (Signed)
History and Physical Interval Note:  05/31/2012 9:13 AM  April Brewer  has presented today for surgery, with the diagnosis of Osteoarthritis of the Left Hip  The various methods of treatment have been discussed with the patient and family. After consideration of risks, benefits and other options for treatment, the patient has consented to  Procedure(s) (LRB): LEFT TOTAL HIP ARTHROPLASTY ANTERIOR APPROACH (Left) as a surgical intervention .  The patients' history has been reviewed, patient examined, no change in status, stable for surgery.  I have reviewed the patients' chart and labs.  Questions were answered to the patient's satisfaction.     [name]

## 2012-05-31 NOTE — Progress Notes (Signed)
X-RAY RESULTS REVIEWED.

## 2012-05-31 NOTE — Progress Notes (Signed)
Dr. Charlann Boxer in - made aware of patient's statements about numbness of left foot and leg

## 2012-05-31 NOTE — Op Note (Signed)
NAME:  April DICOCCO                ACCOUNT NO.: [id number]      MEDICAL RECORD NO.: [id number]      FACILITY:  [facility]      PHYSICIAN:  Durene Romans D  DATE OF BIRTH:  [birth year]     DATE OF PROCEDURE:  05/31/2012                                 OPERATIVE REPORT         PREOPERATIVE DIAGNOSIS: Left  hip osteoarthritis.      POSTOPERATIVE DIAGNOSIS:  Left hip osteoarthritis.      PROCEDURE:  Left total hip replacement through an anterior approach   utilizing DePuy THR system, component size 54mm pinnacle cup, a size 36+4 neutral   Altrex liner, a size 4Hi Tri Lock stem with a 36+1.5 delta ceramic   ball.      SURGEON:  Madlyn Frankel. Charlann Boxer, M.D.      ASSISTANT:  Leilani Able, PA      ANESTHESIA:  General.      SPECIMENS:  None.      COMPLICATIONS:  None.      BLOOD LOSS:  300 cc     DRAINS:  One Hemovac.      INDICATION OF THE PROCEDURE:  April Brewer is a 66 y.o. female who had   presented to office for evaluation of left hip pain.  Radiographs revealed   progressive degenerative changes with bone-on-bone   articulation to the  hip joint.  The patient had painful limited range of   motion significantly affecting their overall quality of life.  The patient was failing to    respond to conservative measures, and at this point was ready   to proceed with more definitive measures.  The patient has noted progressive   degenerative changes in his hip, progressive problems and dysfunction   with regarding the hip prior to surgery.  Consent was obtained for   benefit of pain relief.  Specific risk of infection, DVT, component   failure, dislocation, need for revision surgery, as well discussion of   the anterior versus posterior approach were reviewed.  Consent was   obtained for benefit of anterior pain relief through an anterior   approach.      PROCEDURE IN DETAIL:  The patient was brought to operative theater.   Once adequate anesthesia, preoperative antibiotics, 2gm Ancef  administered.   The patient was positioned supine on the OSI Hanna table.  Once adequate   padding of boney process was carried out, we had predraped out the hip, and  used fluoroscopy to confirm orientation of the pelvis and position.      The left hip was then prepped and draped from proximal iliac crest to   mid thigh with shower curtain technique.      Time-out was performed identifying the patient, planned procedure, and   extremity.     An incision was then made 2 cm distal and lateral to the   anterior superior iliac spine extending over the orientation of the   tensor fascia lata muscle and sharp dissection was carried down to the   fascia of the muscle and protractor placed in the soft tissues.      The fascia was then incised.  The muscle belly was identified and swept   laterally  and retractor placed along the superior neck.  Following   cauterization of the circumflex vessels and removing some pericapsular   fat, a second cobra retractor was placed on the inferior neck.  A third   retractor was placed on the anterior acetabulum after elevating the   anterior rectus.  A L-capsulotomy was along the line of the   superior neck to the trochanteric fossa, then extended proximally and   distally.  Tag sutures were placed and the retractors were then placed   intracapsular.  We then identified the trochanteric fossa and   orientation of my neck cut, confirmed this radiographically   and then made a neck osteotomy with the femur on traction.  The femoral   head was removed without difficulty or complication.  Traction was let   off and retractors were placed posterior and anterior around the   acetabulum.      The labrum and foveal tissue were debrided.  I began reaming with a 47mm   reamer and reamed up to 53mm reamer with good bony bed preparation and a 54   cup was chosen.  The final 54mm Pinnacle cup was then impacted under fluoroscopy  to confirm the depth of penetration and  orientation with respect to   abduction.  A screw was placed followed by the hole eliminator.  The final   36+1.5 Altrex liner was impacted with good visualized rim fit.  The cup was positioned anatomically within the acetabular portion of the pelvis.      At this point, the femur was rolled at 80 degrees.  Further capsule was   released off the inferior aspect of the femoral neck.  I then   released the superior capsule proximally.  The hook was placed laterally   along the femur and elevated manually and held in position with the bed   hook.  The leg was then extended and adducted with the leg rolled to 100   degrees of external rotation.  Once the proximal femur was fully   exposed, I used a box osteotome to set orientation.  I then began   broaching with the starting chili pepper broach and passed this by hand and then broached up to 4.  With the 4 broach in place I chose a high offset neck and did a trial reduction with a 36+1.5 ball.  The offset was appropriate, leg lengths   appeared to be equal, confirmed radiographically.   Given these findings, I went ahead and dislocated the hip, repositioned all   retractors and positioned the right hip in the extended and abducted position.  The final 4Hi Tri Lock stem was   chosen and it was impacted down to the level of neck cut.  Based on this   and the trial reduction, a 36+1.5 delta ceramic ball was chosen and   impacted onto a clean and dry trunnion, and the hip was reduced.  The   hip had been irrigated throughout the case again at this point.  I did   reapproximate the superior capsular leaflet to the anterior leaflet   using #1 Vicryl, placed a medium Hemovac drain deep.  The fascia of the   tensor fascia lata muscle was then reapproximated using #1 Vicryl.  The   remaining wound was closed with 2-0 Vicryl and running 4-0 Monocryl.   The hip was cleaned, dried, and dressed sterilely using Dermabond and   Aquacel dressing.  Drain site  dressed separately.  She  was then brought   to recovery room in stable condition tolerating the procedure well.    Leilani Able, PA-C was present for the entirety of the case involved from   preoperative positioning, perioperative retractor management, general   facilitation of the case, as well as primary wound closure as assistant.            Madlyn Frankel Charlann Boxer, M.D.            MDO/MEDQ  D:  10/20/2011  T:  10/20/2011  Job:  [id number]      Electronically Signed by Durene Romans M.D. on 10/26/2011 09:15:38 AM

## 2012-05-31 NOTE — Transfer of Care (Signed)
Immediate Anesthesia Transfer of Care Note  Patient: April Brewer  Procedure(s) Performed: Procedure(s) (LRB): TOTAL HIP ARTHROPLASTY ANTERIOR APPROACH (Left)  Patient Location: PACU  Anesthesia Type: General  Level of Consciousness: awake, alert  and oriented  Airway & Oxygen Therapy: Patient Spontanous Breathing and Patient connected to face mask oxygen  Post-op Assessment: Report given to PACU RN and Post -op Vital signs reviewed and stable  Post vital signs: Reviewed and stable  Complications: No apparent anesthesia complications

## 2012-05-31 NOTE — Anesthesia Postprocedure Evaluation (Signed)
  Anesthesia Post-op Note  Patient: April Brewer  Procedure(s) Performed: Procedure(s) (LRB): TOTAL HIP ARTHROPLASTY ANTERIOR APPROACH (Left)  Patient Location: PACU  Anesthesia Type: General  Level of Consciousness: awake and alert   Airway and Oxygen Therapy: Patient Spontanous Breathing  Post-op Pain: mild  Post-op Assessment: Post-op Vital signs reviewed, Patient's Cardiovascular Status Stable, Respiratory Function Stable, Patent Airway and No signs of Nausea or vomiting  Post-op Vital Signs: stable  Complications: No apparent anesthesia complications

## 2012-06-01 LAB — BASIC METABOLIC PANEL
BUN: 7 mg/dL (ref 6–23)
CO2: 26 mEq/L (ref 19–32)
Calcium: 9 mg/dL (ref 8.4–10.5)
GFR calc non Af Amer: >90 mL/min (ref >90)
Glucose, Bld: 105 mg/dL — ABNORMAL HIGH (ref 70–99)

## 2012-06-01 LAB — CBC
HCT: 27.5 % — ABNORMAL LOW (ref 36.0–46.0)
Hemoglobin: 9.5 g/dL — ABNORMAL LOW (ref 12.0–15.0)
MCH: 32.5 pg (ref 26.0–34.0)
MCHC: 34.5 g/dL (ref 30.0–36.0)

## 2012-06-01 NOTE — Progress Notes (Signed)
Subjective: 1 Day Post-Op Procedure(s) (LRB): TOTAL HIP ARTHROPLASTY ANTERIOR APPROACH (Left)   Patient reports pain as mild, pain well controlled with medication. No events throughout the night.  Objective:   VITALS:   Filed Vitals:   06/01/12 0652  BP: 120/66  Pulse: 77  Temp: 98.1 F (36.7 C)  Resp: 16    Neurovascular intact Dorsiflexion/Plantar flexion intact Incision: dressing C/D/I No cellulitis present Compartment soft  LABS  Basename 06/01/12 0355  HGB 9.5*  HCT 27.5*  WBC 8.7  PLT 204     Basename 06/01/12 0355  NA 131*  K 4.2  BUN 7  CREATININE 0.56  GLUCOSE 105*     Assessment/Plan: 1 Day Post-Op Procedure(s) (LRB): TOTAL HIP ARTHROPLASTY ANTERIOR APPROACH (Left)   HV drain d/c'ed Foley cath d/c'ed Advance diet Up with therapy D/C IV fluids Plan for discharge tomorrow to home, if continues to do well   April Brewer. April Brewer   PAC  06/01/2012, 8:43 AM

## 2012-06-01 NOTE — Progress Notes (Signed)
CSW consulted for SNF placement. PN reviewed. PT is recommending HHPT. CSW is available to assist with d/c planning if plan changes and SNF placement is needed.  Cori Razor LCSW [phone]

## 2012-06-01 NOTE — Evaluation (Signed)
Physical Therapy Evaluation Patient Details Name: April Brewer MRN: [id number] DOB: [birth year] Today's Date: 06/01/2012 Time: 4098-1191 PT Time Calculation (min): 29 min  PT Assessment / Plan / Recommendation Clinical Impression  Pt presents s/p L THR (direct anterior) POD 1 with decreased overall strength and mobility.  Tolerated ambulation in hallway, however pt very hesitant about ambulation due to soreness in hip.  Pt will benefit from skilled PT in acute venue to address deficits.  PT recommends HHPT for follow up therapy at D./C  to return pt to PLOF.      PT Assessment  Patient needs continued PT services    Follow Up Recommendations  Home health PT    Barriers to Discharge None      lEquipment Recommendations  Rolling walker with 5" wheels    Recommendations for Other Services OT consult   Frequency 7X/week    Precautions / Restrictions Precautions Precautions: None Restrictions Weight Bearing Restrictions: No   Pertinent Vitals/Pain 8-9/10      Mobility  Bed Mobility Bed Mobility: Supine to Sit Supine to Sit: 5: Supervision;HOB elevated Details for Bed Mobility Assistance: Supervision for safety with min cues for technique.  Transfers Transfers: Sit to Stand;Stand to Sit Sit to Stand: 4: Min guard;From elevated surface;With upper extremity assist;From bed Stand to Sit: 4: Min guard;With upper extremity assist;With armrests;To chair/3-in-1 Details for Transfer Assistance: Min/guard for safety with cues for hand placement when sitting for controlled descent and when standing.  Ambulation/Gait Ambulation Distance (Feet): 45 Feet Assistive device: Rolling walker Ambulation/Gait Assistance Details: Cues for sequencing/technique with RW, esp when turning to sit in chair.  Mod cues given to attend to task.   Gait Pattern: Step-to pattern;Decreased stride length Gait velocity: decreased Stairs: No Wheelchair Mobility Wheelchair Mobility: No    Exercises      PT Diagnosis: Difficulty walking;Abnormality of gait;Generalized weakness;Acute pain  PT Problem List: Decreased strength;Decreased activity tolerance;Decreased balance;Decreased mobility;Decreased knowledge of use of DME PT Treatment Interventions: DME instruction;Gait training;Stair training;Functional mobility training;Therapeutic activities;Therapeutic exercise;Balance training;Patient/family education   PT Goals Acute Rehab PT Goals PT Goal Formulation: With patient Time For Goal Achievement: 06/04/12 Potential to Achieve Goals: Good Pt will go Sit to Stand: with supervision PT Goal: Sit to Stand - Progress: Goal set today Pt will go Stand to Sit: with supervision PT Goal: Stand to Sit - Progress: Goal set today Pt will Ambulate: 51 - 150 feet;with supervision;with least restrictive assistive device PT Goal: Ambulate - Progress: Goal set today Pt will Go Up / Down Stairs: 1-2 stairs;with supervision;with least restrictive assistive device PT Goal: Up/Down Stairs - Progress: Goal set today  Visit Information  Last PT Received On: 06/01/12 Assistance Needed: +1    Subjective Data  Subjective: I just got pain pills not too long ago Patient Stated Goal: to go home   Prior Functioning  Home Living Lives With: Spouse;Son Available Help at Discharge: Family Type of Home: House Home Access: Stairs to enter Secretary/administrator of Steps: 1 Home Layout: Two level Alternate Level Stairs-Number of Steps: 3 Alternate Level Stairs-Rails: None Bathroom Shower/Tub: Engineer, manufacturing systems: Handicapped height Home Adaptive Equipment: Wheelchair - manual;Walker - rolling Additional Comments: not sure what kind of walker Prior Function Level of Independence: Independent Able to Take Stairs?: Yes Driving: Yes Vocation: Retired Musician: No difficulties    Cognition  Overall Cognitive Status: Appears within functional limits for tasks  assessed/performed Arousal/Alertness: Awake/alert Orientation Level: Appears intact for tasks assessed Behavior During Session:  WFL for tasks performed    Extremity/Trunk Assessment Right Lower Extremity Assessment RLE ROM/Strength/Tone: WFL for tasks assessed RLE Sensation: WFL - Light Touch RLE Coordination: WFL - gross motor Left Lower Extremity Assessment LLE ROM/Strength/Tone: Deficits LLE ROM/Strength/Tone Deficits: at least 3/5 per functional assessment.  LLE Sensation: WFL - Light Touch LLE Coordination: WFL - gross motor Trunk Assessment Trunk Assessment: Normal   Balance    End of Session PT - End of Session Activity Tolerance: Patient limited by pain Patient left: in chair;with call bell/phone within reach Nurse Communication: Mobility status   Page, Meribeth Mattes 06/01/2012, 10:22 AM

## 2012-06-01 NOTE — Progress Notes (Signed)
Physical Therapy Treatment Patient Details Name: April Brewer MRN: [id number] DOB: November 06, 1946 Today's Date: 06/01/2012 Time: 7846-9629 PT Time Calculation (min): 17 min  PT Assessment / Plan / Recommendation Comments on Treatment Session  Pt deferred ambulation in afternoon due to increased pain, however did agree to perform exercises in bed.      Follow Up Recommendations  Home health PT    Barriers to Discharge        Equipment Recommendations  Rolling walker with 5" wheels    Recommendations for Other Services OT consult  Frequency 7X/week   Plan Discharge plan remains appropriate    Precautions / Restrictions Precautions Precautions: None Restrictions Weight Bearing Restrictions: No   Pertinent Vitals/Pain 8/10    Mobility       Exercises Total Joint Exercises Ankle Circles/Pumps: AROM;Both;20 reps Quad Sets: AROM;Left;10 reps Short Arc Quad: AROM;Left;10 reps Heel Slides: AROM;Left;10 reps Hip ABduction/ADduction: Left;AAROM;10 reps   PT Diagnosis:    PT Problem List:   PT Treatment Interventions:     PT Goals Acute Rehab PT Goals PT Goal Formulation: With patient Time For Goal Achievement: 06/04/12 Potential to Achieve Goals: Good  Visit Information  Last PT Received On: 06/01/12 Assistance Needed: +1    Subjective Data  Subjective: My leg is just sore Patient Stated Goal: to go home   Cognition  Overall Cognitive Status: Appears within functional limits for tasks assessed/performed Arousal/Alertness: Awake/alert Orientation Level: Appears intact for tasks assessed Behavior During Session: [facility] for tasks performed    Balance     End of Session PT - End of Session Activity Tolerance: Patient limited by pain Patient left: in bed;with call bell/phone within reach;with family/visitor present    Lessie Dings 06/01/2012, 5:36 PM

## 2012-06-02 ENCOUNTER — Encounter (HOSPITAL_COMMUNITY): Payer: Self-pay | Admitting: Orthopedic Surgery

## 2012-06-02 LAB — BASIC METABOLIC PANEL
Calcium: 8.8 mg/dL (ref 8.4–10.5)
Creatinine, Ser: 0.57 mg/dL (ref 0.50–1.10)
GFR calc non Af Amer: >90 mL/min (ref >90)
Glucose, Bld: 106 mg/dL — ABNORMAL HIGH (ref 70–99)
Sodium: 133 mEq/L — ABNORMAL LOW (ref 135–145)

## 2012-06-02 LAB — CBC
Hemoglobin: 8.8 g/dL — ABNORMAL LOW (ref 12.0–15.0)
MCH: 31.5 pg (ref 26.0–34.0)
MCHC: 33.6 g/dL (ref 30.0–36.0)
MCV: 93.9 fL (ref 78.0–100.0)
Platelets: 176 10*3/uL (ref 150–400)

## 2012-06-02 MED ORDER — POLYETHYLENE GLYCOL 3350 17 G PO PACK: 17.0000 g | PACK | Freq: Every day | ORAL | Status: AC | PRN

## 2012-06-02 MED ORDER — FERROUS SULFATE 325 (65 FE) MG PO TABS: 325.0000 mg | ORAL_TABLET | Freq: Three times a day (TID) | ORAL | Status: DC

## 2012-06-02 MED ORDER — ASPIRIN EC 325 MG PO TBEC: 325.0000 mg | DELAYED_RELEASE_TABLET | Freq: Two times a day (BID) | ORAL | Status: AC

## 2012-06-02 MED ORDER — HYDROCODONE-ACETAMINOPHEN 7.5-325 MG PO TABS: 1.0000 | ORAL_TABLET | ORAL | Status: AC | PRN

## 2012-06-02 MED ORDER — DSS 100 MG PO CAPS: 100.0000 mg | ORAL_CAPSULE | Freq: Two times a day (BID) | ORAL | Status: AC

## 2012-06-02 NOTE — Progress Notes (Signed)
D/c instructions given and explained to pt. Prescription for norco given to pt. Iv site d/c, pressure dressing applied to site.  Pt stable and ready for d/c.

## 2012-06-02 NOTE — Progress Notes (Signed)
Physical Therapy Treatment Patient Details Name: April Brewer MRN: [id number] DOB: [birth year] Today's Date: 06/02/2012 Time: 1025-1100 PT Time Calculation (min): 35 min  PT Assessment / Plan / Recommendation Comments on Treatment Session  Performed well this session. Pain under better control. DCing home today. Practiced steps. Issued handout for steps/ROM exercises.     Follow Up Recommendations  Home health PT    Barriers to Discharge        Equipment Recommendations  Rolling walker with 5" wheels    Recommendations for Other Services    Frequency 7X/week   Plan Discharge plan remains appropriate    Precautions / Restrictions Precautions Precautions: None Restrictions Weight Bearing Restrictions: No LLE Weight Bearing: Weight bearing as tolerated   Pertinent Vitals/Pain     Mobility  Bed Mobility Bed Mobility: Not assessed Supine to Sit: Not tested (comment) Transfers Transfers: Sit to Stand;Stand to Sit Sit to Stand: 5: Supervision;From chair/3-in-1 Stand to Sit: 5: Supervision;To chair/3-in-1 Details for Transfer Assistance: VCs safety, hand placement.  Ambulation/Gait Ambulation/Gait Assistance: 4: Min guard Ambulation Distance (Feet): 150 Feet Assistive device: Rolling walker Ambulation/Gait Assistance Details: VCs safety, technique, sequence.  Gait Pattern: Step-to pattern;Decreased stride length Stairs: Yes Stairs Assistance: 4: Min assist Stairs Assistance Details (indicate cue type and reason): VCs safety, technique, sequence. A with stabilizing pt and RW Stair Management Technique: Backwards;With walker Number of Stairs: 4     Exercises Total Joint Exercises Ankle Circles/Pumps: AROM;Both;10 reps;Seated Quad Sets: AROM;Both;Seated Heel Slides: AAROM;Left;10 reps;Seated Hip ABduction/ADduction: Left;10 reps;AROM;Seated Long Arc Quad: AROM;Left;10 reps;Seated   PT Diagnosis:    PT Problem List:   PT Treatment Interventions:     PT Goals Acute  Rehab PT Goals PT Goal: Sit to Stand - Progress: Progressing toward goal PT Goal: Stand to Sit - Progress: Progressing toward goal PT Goal: Ambulate - Progress: Progressing toward goal PT Goal: Up/Down Stairs - Progress: Progressing toward goal  Visit Information  Last PT Received On: 06/02/12 Assistance Needed: +1    Subjective Data  Subjective: "I'm not as sore as I was" Patient Stated Goal: Home   Cognition  Overall Cognitive Status: Appears within functional limits for tasks assessed/performed Arousal/Alertness: Awake/alert Orientation Level: Appears intact for tasks assessed Behavior During Session: [facility] for tasks performed    Balance     End of Session PT - End of Session Equipment Utilized During Treatment: Gait belt Activity Tolerance: Patient tolerated treatment well Patient left: in chair;with call bell/phone within reach    Rebeca Alert [facility] 06/02/2012, 11:42 AM [phone]

## 2012-06-02 NOTE — Progress Notes (Signed)
  Subjective: 2 Days Post-Op Procedure(s) (LRB): TOTAL HIP ARTHROPLASTY ANTERIOR APPROACH (Left)   Patient reports pain as mild, pain well controlled. No events throughout the night. Ready to be discharged home with home health.  Objective:   VITALS:   Filed Vitals:   06/02/12 0525  BP: 102/69  Pulse: 93  Temp: 98.3 F (36.8 C)  Resp: 18    Neurovascular intact Dorsiflexion/Plantar flexion intact Incision: dressing C/D/I No cellulitis present Compartment soft  LABS  Basename 06/02/12 0357 06/01/12 0355  HGB 8.8* 9.5*  HCT 26.2* 27.5*  WBC 7.2 8.7  PLT 176 204     Basename 06/02/12 0357 06/01/12 0355  NA 133* 131*  K 3.7 4.2  BUN 6 7  CREATININE 0.57 0.56  GLUCOSE 106* 105*     Assessment/Plan: 2 Days Post-Op Procedure(s) (LRB): TOTAL HIP ARTHROPLASTY ANTERIOR APPROACH (Left)   Up with therapy Discharge home with home health Follow up in 2 weeks at [facility].  Follow-up Information    Follow up with OLIN,Emerson Schreifels D in 2 weeks.   Contact information:   [facility] [address], [address] [place] [address] [phone]          Anastasio Auerbach. Marquelle Balow   PAC  06/02/2012, 8:50 AM

## 2012-06-02 NOTE — Progress Notes (Signed)
Discharge summary sent to payer through [facility]  

## 2012-06-02 NOTE — Care Management Note (Signed)
    Page 1 of 2   06/02/2012     2:13:14 PM   CARE MANAGEMENT NOTE 06/02/2012  Patient:  April Brewer, April Brewer   Account Number:  [id number]  Date Initiated:  06/01/2012  Documentation initiated by:  Colleen Can  Subjective/Objective Assessment:   dx osteoarthritis left hip; total hip replacemnt-anterior approach     Action/Plan:   Cm spoke with patient and spouse. Plans are for patient to return to home in [place], [place] where spouse will be caregiver. Already has standard walker, crutches, wheelchair; will need rw   Anticipated DC Date:  06/01/2012   Anticipated DC Plan:  HOME W HOME HEALTH SERVICES  In-house referral  NA      DC Planning Services  CM consult      [facility] Choice  HOME HEALTH  DURABLE MEDICAL EQUIPMENT   Choice offered to / List presented to:  C-1 Patient   DME arranged  Levan Hurst      DME agency  [facility]     HH arranged  HH-2 PT      [facility] agency  [facility]   Status of service:  Completed, signed off Medicare Important Message given?  NA - LOS <3 / Initial given by admissions (If response is "NO", the following Medicare IM given date fields will be blank) Date Medicare IM given:   Date Additional Medicare IM given:    Discharge Disposition:    Per UR Regulation:  Reviewed for med. necessity/level of care/duration of stay  If discussed at Long Length of Stay Meetings, dates discussed:    Comments:  06/02/2012 [name] Bsn CCM [phone] [facility] Intake-karen/HHorders, op note, d/c summary faxed to [phone] with confirmation. Start of services tomorrow 06/03/2012-Anticipate discharge today. RW has been delivered to pt's room   06/05 /2013 List of [facility] agencies placed on shadow chart/lmanning,rn bsn ccm

## 2012-06-02 NOTE — Progress Notes (Signed)
Occupational Therapy Note Order noted. Spoke with patient and she doesn't feel she needs OT. She has a high toilet at home and plans to sponge initially. Spouse able to assist. Will sign off for OT. Judithann Sauger OTR/L [phone] 06/02/2012

## 2012-06-02 NOTE — Discharge Summary (Signed)
Physician Discharge Summary  Patient ID: April Brewer MRN: [id number] DOB/AGE: 1946-01-30 66 y.o.  Admit date: 05/31/2012 Discharge date: 06/02/2012  Procedures:  Procedure(s) (LRB): TOTAL HIP ARTHROPLASTY ANTERIOR APPROACH (Left)  Attending Physician:  Dr. Durene Romans   Admission Diagnoses: Left hip OA and pain   Discharge Diagnoses:  Principal Problem:  *S/P left THA, AA ALLERGIC RHINITIS   DEPRESSION   GOITER   INSOMNIA   LOW BACK PAIN   OSTEOPENIA   Arthritis  HYPOTHYROIDISM    HPI: Pt is a 66 y.o. female complaining of left hip pain for 2+ years. Pain had continually increased since the beginning. X-rays in the clinic show end-stage arthritic changes of the left hip. Pt has tried various conservative treatments which have failed to alleviate their symptoms, including NSAIDs. Various options are discussed with the patient. Risks, benefits and expectations were discussed with the patient. Patient understand the risks, benefits and expectations and wishes to proceed with surgery.   PCP: Rogelia Boga, MD, MD   Discharged Condition: good  Hospital Course:  Patient underwent the above stated procedure on 05/31/2012. Patient tolerated the procedure well and brought to the recovery room in good condition and subsequently to the floor.   POD #1 BP: 120/66 ; Pulse: 77 ; Temp: 98.1 F (36.7 C) ; Resp: 16  Pt's foley was removed, as well as the hemovac drain removed. IV was changed to a saline lock. Patient reports pain as mild, pain well controlled with medication. No events throughout the night. Neurovascular intact, dorsiflexion/plantar flexion intact, incision: dressing C/D/I, no cellulitis present and compartment soft.   LABS  Basename  06/01/12 0355   HGB  9.5  HCT  27.5   POD #2  BP: 102/69 ; Pulse: 93 ; Temp: 98.3 F (36.8 C) ; Resp: 18  Patient reports pain as mild, pain well controlled. No events throughout the night. Ready to be discharged home with  home health. Neurovascular intact, dorsiflexion/plantar flexion intact, incision: dressing C/D/I, no cellulitis present and compartment soft.   LABS  Basename  06/02/12 0357   HGB  8.8  HCT  26.2    Discharge Exam: General appearance: alert, cooperative and no distress Extremities: Homans sign is negative, no sign of DVT, no edema, redness or tenderness in the calves or thighs and no ulcers, gangrene or trophic changes  Disposition:  Home  with follow up in 2 weeks  Follow-up Information    Follow up with OLIN,Keyvin Rison D in 2 weeks.   Contact information:   [facility] [address], [address] [place] [address] [phone]          Discharge Orders    Future Appointments: Provider: Department: Dept Phone: Center:   06/16/2012 1:00 PM Gordy Savers, MD [facility] [phone] [facility]     Future Orders Please Complete By Expires   Diet - low sodium heart healthy      Call MD / Call 911      Comments:   If you experience chest pain or shortness of breath, CALL 911 and be transported to the hospital emergency room.  If you develope a fever above 101 F, pus (white drainage) or increased drainage or redness at the wound, or calf pain, call your surgeon's office.   Discharge instructions      Comments:   Maintain surgical dressing for 8 days, then replace with gauze and tape. Keep the area dry and clean until follow up. Follow up in 2 weeks  at [facility]. Call with any questions or concerns.     Constipation Prevention      Comments:   Drink plenty of fluids.  Prune juice may be helpful.  You may use a stool softener, such as Colace (over the counter) 100 mg twice a day.  Use MiraLax (over the counter) for constipation as needed.   Increase activity slowly as tolerated      Driving restrictions      Comments:   No driving for 4 weeks   Change dressing      Comments:   Maintain surgical dressing for 8 days, then  replace with 4x4 guaze and tape. Keep the area dry and clean.   TED hose      Comments:   Use stockings (TED hose) for 2 weeks on both leg(s).  You may remove them at night for sleeping.      Current Discharge Medication List    START taking these medications   Details  aspirin EC 325 MG tablet Take 1 tablet (325 mg total) by mouth 2 (two) times daily. X 4 weeks Qty: 60 tablet, Refills: 0    docusate sodium 100 MG CAPS Take 100 mg by mouth 2 (two) times daily.    ferrous sulfate 325 (65 FE) MG tablet Take 1 tablet (325 mg total) by mouth 3 (three) times daily after meals.    HYDROcodone-acetaminophen (NORCO) 7.5-325 MG per tablet Take 1-2 tablets by mouth every 4 (four) hours as needed for pain. Qty: 120 tablet, Refills: 0    polyethylene glycol (MIRALAX / GLYCOLAX) packet Take 17 g by mouth daily as needed. Qty: 14 each      CONTINUE these medications which have NOT CHANGED   Details  alprazolam (XANAX) 2 MG tablet Take 2 mg by mouth 2 (two) times daily as needed. One half or one tablet twice daily as needed    fluticasone (FLONASE) 50 MCG/ACT nasal spray Place 2 sprays into the nose daily as needed. For allergies     furosemide (LASIX) 20 MG tablet Take 20 mg by mouth daily with breakfast.    levothyroxine (SYNTHROID, LEVOTHROID) 75 MCG tablet Take 75 mcg by mouth daily with breakfast.    methocarbamol (ROBAXIN) 500 MG tablet Take 500 mg by mouth 3 (three) times daily as needed. For muscle spasms    sertraline (ZOLOFT) 50 MG tablet Take 50 mg by mouth daily with breakfast.    b complex vitamins tablet Take 1 tablet by mouth daily.    fish oil-omega-3 fatty acids 1000 MG capsule Take 1 g by mouth 2 (two) times a week.    Multiple Vitamin (MULITIVITAMIN WITH MINERALS) TABS Take 1 tablet by mouth daily.    vitamin C (ASCORBIC ACID) 500 MG tablet Take 500 mg by mouth daily.    vitamin E 400 UNIT capsule Take 400 Units by mouth daily.      STOP taking these medications      nabumetone (RELAFEN) 500 MG tablet Comments:  Reason for Stopping:       oxyCODONE (OXY IR/ROXICODONE) 5 MG immediate release tablet Comments:  Reason for Stopping:           Signed: Anastasio Auerbach. Legion Discher   PAC  06/02/2012, 10:15 AM

## 2012-06-16 ENCOUNTER — Encounter: Payer: Self-pay | Admitting: Internal Medicine

## 2012-06-16 ENCOUNTER — Ambulatory Visit (INDEPENDENT_AMBULATORY_CARE_PROVIDER_SITE_OTHER): Payer: Medicare Other | Admitting: Internal Medicine

## 2012-06-16 VITALS — BP 110/68 | Ht 63.0 in | Wt 149.0 lb

## 2012-06-16 DIAGNOSIS — Z96649 Presence of unspecified artificial hip joint: Secondary | ICD-10-CM

## 2012-06-16 DIAGNOSIS — I10 Essential (primary) hypertension: Secondary | ICD-10-CM

## 2012-06-16 DIAGNOSIS — J309 Allergic rhinitis, unspecified: Secondary | ICD-10-CM

## 2012-06-16 DIAGNOSIS — K219 Gastro-esophageal reflux disease without esophagitis: Secondary | ICD-10-CM

## 2012-06-16 MED ORDER — FLUTICASONE PROPIONATE 50 MCG/ACT NA SUSP: 2.0000 | Freq: Every day | NASAL | Status: DC | PRN

## 2012-06-16 MED ORDER — SERTRALINE HCL 50 MG PO TABS: 50.0000 mg | ORAL_TABLET | Freq: Every day | ORAL | Status: DC

## 2012-06-16 MED ORDER — LEVOTHYROXINE SODIUM 75 MCG PO TABS: 75.0000 ug | ORAL_TABLET | Freq: Every day | ORAL | Status: DC

## 2012-06-16 MED ORDER — FUROSEMIDE 20 MG PO TABS: 40.0000 mg | ORAL_TABLET | Freq: Every day | ORAL | Status: DC

## 2012-06-16 NOTE — Patient Instructions (Signed)
Limit your sodium (Salt) intake    It is important that you exercise regularly, at least 20 minutes 3 to 4 times per week.  If you develop chest pain or shortness of breath seek  medical attention.  You need to lose weight.  Consider a lower calorie diet and regular exercise.  Return in 6 months for follow-up   

## 2012-06-16 NOTE — Progress Notes (Signed)
Subjective:    Patient ID: April Brewer, female    DOB: Jan 13, 1946, 66 y.o.   MRN: [id number]  HPI  66 year old patient who is seen today for followup. She's had a recent left hip surgery and has done remarkably well. She has treated hypertension and allergic rhinitis. She has hypothyroidism. No major concerns or complaints. She still has some occasional pedal edema controlled with furosemide.  Past Medical History  Diagnosis Date  . ALLERGIC RHINITIS 06/02/2007  . DEPRESSION 06/02/2007  . GOITER 06/02/2007  . INSOMNIA 06/02/2007  . LOW BACK PAIN 02/18/2009  . OSTEOPENIA 02/18/2009  . Arthritis   . HYPOTHYROIDISM 06/02/2007    GOITER  . Swelling     History   Social History  . Marital Status: Married    Spouse Name: N/A    Number of Children: N/A  . Years of Education: N/A   Occupational History  . Not on file.   Social History Main Topics  . Smoking status: Never Smoker   . Smokeless tobacco: Never Used  . Alcohol Use: No  . Drug Use: No  . Sexually Active: Not on file   Other Topics Concern  . Not on file   Social History Narrative  . No narrative on file    Past Surgical History  Procedure Date  . Abdominal hysterectomy   . Eye surgery   . Cholecystectomy   . Shoulder surgery     right  . Back surgery     X 5  . Total hip arthroplasty 05/31/2012    Procedure: TOTAL HIP ARTHROPLASTY ANTERIOR APPROACH;  Surgeon: [name], MD;  Location: [facility];  Service: Orthopedics;  Laterality: Left;    No family history on file.  No Known Allergies  Current Outpatient Prescriptions on File Prior to Visit  Medication Sig Dispense Refill  . alprazolam (XANAX) 2 MG tablet Take 2 mg by mouth 2 (two) times daily as needed. One half or one tablet twice daily as needed      . b complex vitamins tablet Take 1 tablet by mouth daily.      . ferrous sulfate 325 (65 FE) MG tablet Take 1 tablet (325 mg total) by mouth 3 (three) times daily after meals.      . fish oil-omega-3  fatty acids 1000 MG capsule Take 1 g by mouth 2 (two) times a week.      . fluticasone (FLONASE) 50 MCG/ACT nasal spray Place 2 sprays into the nose daily as needed. For allergies       . furosemide (LASIX) 20 MG tablet Take 20 mg by mouth daily with breakfast.      . levothyroxine (SYNTHROID, LEVOTHROID) 75 MCG tablet Take 75 mcg by mouth daily with breakfast.      . methocarbamol (ROBAXIN) 500 MG tablet Take 500 mg by mouth 3 (three) times daily as needed. For muscle spasms      . Multiple Vitamin (MULITIVITAMIN WITH MINERALS) TABS Take 1 tablet by mouth daily.      . sertraline (ZOLOFT) 50 MG tablet Take 50 mg by mouth daily with breakfast.      . vitamin C (ASCORBIC ACID) 500 MG tablet Take 500 mg by mouth daily.      . vitamin E 400 UNIT capsule Take 400 Units by mouth daily.        BP 110/68  Ht 5\' 3"  (1.6 m)  Wt 149 lb (67.586 kg)  BMI 26.39 kg/m2  Review of Systems  Constitutional: Negative.   HENT: Negative for hearing loss, congestion, sore throat, rhinorrhea, dental problem, sinus pressure and tinnitus.   Eyes: Negative for pain, discharge and visual disturbance.  Respiratory: Negative for cough and shortness of breath.   Cardiovascular: Positive for leg swelling. Negative for chest pain and palpitations.  Gastrointestinal: Negative for nausea, vomiting, abdominal pain, diarrhea, constipation, blood in stool and abdominal distention.  Genitourinary: Negative for dysuria, urgency, frequency, hematuria, flank pain, vaginal bleeding, vaginal discharge, difficulty urinating, vaginal pain and pelvic pain.  Musculoskeletal: Positive for arthralgias and gait problem. Negative for joint swelling.  Skin: Negative for rash.  Neurological: Negative for dizziness, syncope, speech difficulty, weakness, numbness and headaches.  Hematological: Negative for adenopathy.  Psychiatric/Behavioral: Negative for behavioral problems, dysphoric mood and agitation. The patient is not  nervous/anxious.        Objective:   Physical Exam  Constitutional: She is oriented to person, place, and time. She appears well-developed and well-nourished.  HENT:  Head: Normocephalic.  Right Ear: External ear normal.  Left Ear: External ear normal.  Mouth/Throat: Oropharynx is clear and moist.  Eyes: Conjunctivae and EOM are normal. Pupils are equal, round, and reactive to light.  Neck: Normal range of motion. Neck supple. No thyromegaly present.  Cardiovascular: Normal rate, regular rhythm, normal heart sounds and intact distal pulses.   Pulmonary/Chest: Effort normal and breath sounds normal.  Abdominal: Soft. Bowel sounds are normal. She exhibits no mass. There is no tenderness.  Musculoskeletal: Normal range of motion.  Lymphadenopathy:    She has no cervical adenopathy.  Neurological: She is alert and oriented to person, place, and time.  Skin: Skin is warm and dry. No rash noted.  Psychiatric: She has a normal mood and affect. Her behavior is normal.          Assessment & Plan:   Hypertension well controlled Hypothyroidism. Allergic rhinitis stable History depression stable  All medications refilled Recheck 6 months

## 2012-08-12 ENCOUNTER — Other Ambulatory Visit: Payer: Self-pay

## 2012-08-12 MED ORDER — ALPRAZOLAM 2 MG PO TABS: ORAL_TABLET | ORAL | Status: DC

## 2012-08-19 ENCOUNTER — Encounter: Payer: Self-pay | Admitting: Internal Medicine

## 2012-08-19 ENCOUNTER — Ambulatory Visit (INDEPENDENT_AMBULATORY_CARE_PROVIDER_SITE_OTHER): Payer: Medicare Other | Admitting: Internal Medicine

## 2012-08-19 VITALS — BP 108/74 | HR 91 | Temp 98.2°F | Wt 154.0 lb

## 2012-08-19 DIAGNOSIS — I10 Essential (primary) hypertension: Secondary | ICD-10-CM

## 2012-08-19 DIAGNOSIS — J069 Acute upper respiratory infection, unspecified: Secondary | ICD-10-CM

## 2012-08-19 DIAGNOSIS — J309 Allergic rhinitis, unspecified: Secondary | ICD-10-CM

## 2012-08-19 MED ORDER — BENZONATATE 200 MG PO CAPS
200.0000 mg | ORAL_CAPSULE | Freq: Three times a day (TID) | ORAL | Status: AC | PRN
Start: 2012-08-19 — End: 2012-08-26

## 2012-08-19 MED ORDER — TRIAMCINOLONE ACETONIDE 0.1 % EX CREA: TOPICAL_CREAM | Freq: Two times a day (BID) | CUTANEOUS | Status: DC

## 2012-08-19 NOTE — Patient Instructions (Signed)
mucinex DM twice daily  Get plenty of rest, Drink lots of  clear liquids, and use Tylenol or ibuprofen for fever and discomfort.

## 2012-08-19 NOTE — Progress Notes (Signed)
Subjective:    Patient ID: April Brewer, female    DOB: 05/03/1946, 66 y.o.   MRN: [id number]  HPI  66 year old patient who has a history of allergic rhinitis;  she also has a history of osteoarthritis and uses when necessary narcotic analgesics. For the past week she has had chest congestion and largely nonproductive cough at times cough is productive of scanty thick secretions. There's been no real fever wheezing or shortness of breath. She has been using Mucinex without much benefit. Manus medication include fluticasone.  Past Medical History  Diagnosis Date  . ALLERGIC RHINITIS 06/02/2007  . DEPRESSION 06/02/2007  . GOITER 06/02/2007  . INSOMNIA 06/02/2007  . LOW BACK PAIN 02/18/2009  . OSTEOPENIA 02/18/2009  . Arthritis   . HYPOTHYROIDISM 06/02/2007    GOITER  . Swelling     History   Social History  . Marital Status: Married    Spouse Name: N/A    Number of Children: N/A  . Years of Education: N/A   Occupational History  . Not on file.   Social History Main Topics  . Smoking status: Never Smoker   . Smokeless tobacco: Never Used  . Alcohol Use: No  . Drug Use: No  . Sexually Active: Not on file   Other Topics Concern  . Not on file   Social History Narrative  . No narrative on file    Past Surgical History  Procedure Date  . Abdominal hysterectomy   . Eye surgery   . Cholecystectomy   . Shoulder surgery     right  . Back surgery     X 5  . Total hip arthroplasty 05/31/2012    Procedure: TOTAL HIP ARTHROPLASTY ANTERIOR APPROACH;  Surgeon: [name], MD;  Location: [facility];  Service: Orthopedics;  Laterality: Left;    No family history on file.  No Known Allergies  Current Outpatient Prescriptions on File Prior to Visit  Medication Sig Dispense Refill  . alprazolam (XANAX) 2 MG tablet One half or one tablet twice daily as needed  180 tablet  1  . b complex vitamins tablet Take 1 tablet by mouth daily.      . ferrous sulfate 325 (65 FE) MG tablet Take 1  tablet (325 mg total) by mouth 3 (three) times daily after meals.      . fish oil-omega-3 fatty acids 1000 MG capsule Take 1 g by mouth 2 (two) times a week.      . fluticasone (FLONASE) 50 MCG/ACT nasal spray Place 2 sprays into the nose daily as needed. For allergies  16 g  5  . furosemide (LASIX) 20 MG tablet Take 2 tablets (40 mg total) by mouth daily.  60 tablet  4  . HYDROcodone-acetaminophen (NORCO) 7.5-325 MG per tablet Post surgical      . ibuprofen (ADVIL,MOTRIN) 600 MG tablet Post surgical      . levothyroxine (SYNTHROID, LEVOTHROID) 75 MCG tablet Take 1 tablet (75 mcg total) by mouth daily with breakfast.  90 tablet  6  . methocarbamol (ROBAXIN) 500 MG tablet Take 500 mg by mouth 3 (three) times daily as needed. For muscle spasms      . Multiple Vitamin (MULITIVITAMIN WITH MINERALS) TABS Take 1 tablet by mouth daily.      Marland Kitchen oxyCODONE (OXY IR/ROXICODONE) 5 MG immediate release tablet Post surgical      . sertraline (ZOLOFT) 50 MG tablet Take 1 tablet (50 mg total) by mouth daily with breakfast.  90 tablet  6  . vitamin C (ASCORBIC ACID) 500 MG tablet Take 500 mg by mouth daily.      . vitamin E 400 UNIT capsule Take 400 Units by mouth daily.        BP 108/74  Pulse 91  Temp 98.2 F (36.8 C) (Oral)  Wt 154 lb (69.854 kg)  SpO2 97%      Review of Systems  Constitutional: Negative.   HENT: Positive for congestion, rhinorrhea and postnasal drip. Negative for hearing loss, sore throat, dental problem, sinus pressure and tinnitus.   Eyes: Negative for pain, discharge and visual disturbance.  Respiratory: Positive for shortness of breath. Negative for cough.   Cardiovascular: Negative for chest pain, palpitations and leg swelling.  Gastrointestinal: Negative for nausea, vomiting, abdominal pain, diarrhea, constipation, blood in stool and abdominal distention.  Genitourinary: Negative for dysuria, urgency, frequency, hematuria, flank pain, vaginal bleeding, vaginal discharge,  difficulty urinating, vaginal pain and pelvic pain.  Musculoskeletal: Negative for joint swelling, arthralgias and gait problem.  Skin: Negative for rash.  Neurological: Negative for dizziness, syncope, speech difficulty, weakness, numbness and headaches.  Hematological: Negative for adenopathy.  Psychiatric/Behavioral: Negative for behavioral problems, dysphoric mood and agitation. The patient is not nervous/anxious.        Objective:   Physical Exam  Constitutional: She is oriented to person, place, and time. She appears well-developed and well-nourished.  HENT:  Head: Normocephalic.  Right Ear: External ear normal.  Left Ear: External ear normal.  Mouth/Throat: Oropharynx is clear and moist.  Eyes: Conjunctivae and EOM are normal. Pupils are equal, round, and reactive to light.  Neck: Normal range of motion. Neck supple. No thyromegaly present.  Cardiovascular: Normal rate, regular rhythm, normal heart sounds and intact distal pulses.   Pulmonary/Chest: Effort normal and breath sounds normal. No respiratory distress. She has no wheezes. She has no rales.       O2 saturation 97%  Abdominal: Soft. Bowel sounds are normal. She exhibits no mass. There is no tenderness.  Musculoskeletal: Normal range of motion.  Lymphadenopathy:    She has no cervical adenopathy.  Neurological: She is alert and oriented to person, place, and time.  Skin: Skin is warm and dry. No rash noted.  Psychiatric: She has a normal mood and affect. Her behavior is normal.          Assessment & Plan:    Viral URI with cough- will treat symptomatically HTN- stable

## 2012-08-31 ENCOUNTER — Ambulatory Visit (INDEPENDENT_AMBULATORY_CARE_PROVIDER_SITE_OTHER): Payer: Medicare Other | Admitting: Internal Medicine

## 2012-08-31 ENCOUNTER — Ambulatory Visit (INDEPENDENT_AMBULATORY_CARE_PROVIDER_SITE_OTHER)
Admission: RE | Admit: 2012-08-31 | Discharge: 2012-08-31 | Disposition: A | Discharge status: Home / Self Care | Payer: Medicare Other | Source: Ambulatory Visit | Attending: Internal Medicine | Admitting: Internal Medicine

## 2012-08-31 ENCOUNTER — Encounter: Payer: Self-pay | Admitting: Internal Medicine

## 2012-08-31 VITALS — BP 160/80 | HR 92 | Temp 98.1°F | Wt 149.0 lb

## 2012-08-31 DIAGNOSIS — R05 Cough: Secondary | ICD-10-CM

## 2012-08-31 DIAGNOSIS — I10 Essential (primary) hypertension: Secondary | ICD-10-CM

## 2012-08-31 DIAGNOSIS — R059 Cough, unspecified: Secondary | ICD-10-CM

## 2012-08-31 MED ORDER — AMOXICILLIN-POT CLAVULANATE 875-125 MG PO TABS: 1.0000 | ORAL_TABLET | Freq: Two times a day (BID) | ORAL | Status: AC

## 2012-08-31 NOTE — Progress Notes (Signed)
Subjective:    Patient ID: Eusebio Me, female    DOB: [birth year], 66 y.o.   MRN: [id number]  HPI Patient comes in today for SDA for  ongoing problem evaluation. Here with husband PCP not available today    complains of continued coughing intermittently up green yellow discolored phlegm without subsiding intensity with increased at night. She has some soreness around her eyes at times but no severe headache. Feels like she has mucus and congestion in her chest all the time. 3 months.   Onset with fever? an aintermittently . And had swelling fever upper abdomen and then had vomiting episode last week Feels like something in lungs and hard to eat.   No blood .  Has used Mucinex D. as per Dr Kirtland Bouchard  Husband  Had week of illness and got better but she didn't.  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  Review of Systems Negative as per history of present illness no unusual bleeding unusual headaches some itching around the eyes no discharge new neurologic symptoms falling. Past history family history social history reviewed in the electronic medical record.  Outpatient Encounter Prescriptions as of 08/31/2012  Medication Sig Dispense Refill  . alprazolam (XANAX) 2 MG tablet One half or one tablet twice daily as needed  180 tablet  1  . b complex vitamins tablet Take 1 tablet by mouth daily.      . ferrous sulfate 325 (65 FE) MG tablet Take 1 tablet (325 mg total) by mouth 3 (three) times daily after meals.      . fish oil-omega-3 fatty acids 1000 MG capsule Take 1 g by mouth 2 (two) times a week.      . fluticasone (FLONASE) 50 MCG/ACT nasal spray Place 2 sprays into the nose daily as needed. For allergies  16 g  5  . furosemide (LASIX) 20 MG tablet Take 2 tablets (40 mg total) by mouth daily.  60 tablet  4  . HYDROcodone-acetaminophen (NORCO) 7.5-325 MG per tablet  Post surgical      . ibuprofen (ADVIL,MOTRIN) 600 MG tablet Post surgical      . levothyroxine (SYNTHROID, LEVOTHROID) 75 MCG tablet Take 1 tablet (75 mcg total) by mouth daily with breakfast.  90 tablet  6  . methocarbamol (ROBAXIN) 500 MG tablet Take 500 mg by mouth 3 (three) times daily as needed. For muscle spasms      . Multiple Vitamin (MULITIVITAMIN WITH MINERALS) TABS Take 1 tablet by mouth daily.      Marland Kitchen oxyCODONE (OXY IR/ROXICODONE) 5 MG immediate release tablet Post surgical      . sertraline (ZOLOFT) 50 MG tablet Take 1 tablet (50 mg total) by mouth daily with breakfast.  90 tablet  6  . triamcinolone cream (KENALOG) 0.1 % Apply topically 2 (two) times daily.  30 g  0  . vitamin C (ASCORBIC ACID) 500 MG tablet Take 500 mg by mouth daily.      . vitamin E 400 UNIT capsule Take 400 Units by mouth daily.          Objective:   Physical Exam BP 160/80  Pulse 92  Temp 98.1 F (36.7 C) (Oral)  Wt 149 lb (67.586 kg)  SpO2 96%  Repeat blood pressure 140/80 right arm sitting. Well-developed well-nourished in no acute distress with intermittent coughing presents Corder sideways plus yellow-green phlegm without blood sometimes clear.  She appears nasally congested also. HEENT atraumatic eyes without discharge face minimally tender TMs intact nares slight congestion OP tongue is midline no obvious lesions Neck supple thyroid palpable no nodules `Chest:  Clear to A&P without wheezes rales or rhonchi CV:  S1-S2 no gallops or murmurs peripheral perfusion is normal Abdomen:  Sof,t normal bowel sounds without hepatosplenomegaly, no guarding rebound or masses no CVA tenderness No clubbing cyanosis or edema     Assessment & Plan:   Prolonged cough 3 months without resolution with associated production intermittently for realignment discharge.  Possible low-grade sinusitis. No other alarm features would get chest x-ray today and treat for possible bacterial bronchitis sinusitis. Discussed with  patient that usually these situations resolved in about 3-4 weeks but because this is persistent would intervene differently. She apparently has no underlying respiratory chronic disease.

## 2012-08-31 NOTE — Patient Instructions (Signed)
Get the chest x ray and depending on results we will begin and antibiotic for possible sinsusitis /bronchitis that could be from a bacteria .  Will contact you about plan and if antibiotic doesn't resolve the issue you need to follow up with Dr . Kirtland Bouchard .   Bronchitis Bronchitis is the body's way of reacting to injury and/or infection (inflammation) of the bronchi. Bronchi are the air tubes that extend from the windpipe into the lungs. If the inflammation becomes severe, it may cause shortness of breath. CAUSES  Inflammation may be caused by:  A virus.   Germs (bacteria).   Dust.   Allergens.   Pollutants and many other irritants.  The cells lining the bronchial tree are covered with tiny hairs (cilia). These constantly beat upward, away from the lungs, toward the mouth. This keeps the lungs free of pollutants. When these cells become too irritated and are unable to do their job, mucus begins to develop. This causes the characteristic cough of bronchitis. The cough clears the lungs when the cilia are unable to do their job. Without either of these protective mechanisms, the mucus would settle in the lungs. Then you would develop pneumonia. Smoking is a common cause of bronchitis and can contribute to pneumonia. Stopping this habit is the single most important thing you can do to help yourself. TREATMENT   Your caregiver may prescribe an antibiotic if the cough is caused by bacteria. Also, medicines that open up your airways make it easier to breathe. Your caregiver may also recommend or prescribe an expectorant. It will loosen the mucus to be coughed up. Only take over-the-counter or prescription medicines for pain, discomfort, or fever as directed by your caregiver.   Removing whatever causes the problem (smoking, for example) is critical to preventing the problem from getting worse.   Cough suppressants may be prescribed for relief of cough symptoms.   Inhaled medicines may be prescribed to  help with symptoms now and to help prevent problems from returning.   For those with recurrent (chronic) bronchitis, there may be a need for steroid medicines.  SEEK IMMEDIATE MEDICAL CARE IF:   During treatment, you develop more pus-like mucus (purulent sputum).   You have a fever.   Your baby is older than 3 months with a rectal temperature of 102 F (38.9 C) or higher.   Your baby is 45 months old or younger with a rectal temperature of 100.4 F (38 C) or higher.   You become progressively more ill.   You have increased difficulty breathing, wheezing, or shortness of breath.  It is necessary to seek immediate medical care if you are elderly or sick from any other disease. MAKE SURE YOU:   Understand these instructions.   Will watch your condition.   Will get help right away if you are not doing well or get worse.  Document Released: 12/14/2005 Document Revised: 12/03/2011 Document Reviewed: 10/23/2008 [facility] Patient Information 2012 [place], [place].

## 2012-09-08 ENCOUNTER — Telehealth: Payer: Self-pay

## 2012-09-08 ENCOUNTER — Other Ambulatory Visit: Payer: Self-pay | Admitting: Internal Medicine

## 2012-09-08 NOTE — Telephone Encounter (Signed)
VM from pt - not any better - requesting more abx - was seen by dr. Fabian Sharp last week Please advise

## 2012-09-12 NOTE — Telephone Encounter (Signed)
Please advise ok to RF abx?  from when she was seen by dr. Fabian Sharp

## 2012-09-12 NOTE — Telephone Encounter (Signed)
Need more information.  Please talk with patient live . Any change at all from antibiotic? Is she still coughing up discolored phelgm?  ? Sob or fever?

## 2012-09-12 NOTE — Telephone Encounter (Signed)
Attempt to call- Vm - LMTCB how are sx?

## 2012-09-12 NOTE — Telephone Encounter (Signed)
Pt called and said that she still has a cough,chest congestion - yellow and thick, sometimes running a low grade fever, rattling and wheezing in chest at times. Pt req refill of amoxicillin-clavulanate (AUGMENTIN) 875-125 MG per tablet called in to [facility] in [place].

## 2012-09-12 NOTE — Telephone Encounter (Signed)
Sent to [facility] and waiting on response.

## 2012-09-14 NOTE — Telephone Encounter (Signed)
Need to see if she was getting better with original antibiotic and maybe not a long enough course or did not make any progress.  LM on cell and home phone for the pt to return my call.

## 2012-09-15 ENCOUNTER — Other Ambulatory Visit: Payer: Self-pay | Admitting: Internal Medicine

## 2012-09-15 MED ORDER — DOXYCYCLINE HYCLATE 100 MG PO TABS: 100.0000 mg | ORAL_TABLET | Freq: Two times a day (BID) | ORAL | Status: DC

## 2012-09-15 MED ORDER — PREDNISONE 20 MG PO TABS: 40.0000 mg | ORAL_TABLET | Freq: Every day | ORAL | Status: DC

## 2012-09-15 NOTE — Telephone Encounter (Signed)
Spoke to the pt.  Not much improvement with last antibiotics.  New sent to the pharmacy along with prednisone.

## 2012-09-15 NOTE — Telephone Encounter (Signed)
Would consider change to doxycycline 100 bid for 10 days and also 5 day pred course  40 mg  Per day  for 5 days  If  Prev antibiotic not helped at all.  Or can have Dr Kirtland Bouchard advise .

## 2012-10-07 ENCOUNTER — Telehealth: Payer: Self-pay | Admitting: Internal Medicine

## 2012-10-07 MED ORDER — DOXYCYCLINE HYCLATE 100 MG PO TABS: 100.0000 mg | ORAL_TABLET | Freq: Two times a day (BID) | ORAL | Status: DC

## 2012-10-07 NOTE — Telephone Encounter (Signed)
done

## 2012-10-07 NOTE — Telephone Encounter (Signed)
Pt still has a lot of chest congestion. Pt req refill of abx to [facility] in [place].

## 2012-10-07 NOTE — Telephone Encounter (Signed)
Saw dr. Fabian Sharp 9/9 - URI , was given 2 rounds abx -please advise

## 2012-10-07 NOTE — Telephone Encounter (Signed)
Doxycycline 100 mg #20 one twice a day 

## 2012-10-25 ENCOUNTER — Ambulatory Visit (INDEPENDENT_AMBULATORY_CARE_PROVIDER_SITE_OTHER): Payer: Medicare Other | Admitting: Internal Medicine

## 2012-10-25 DIAGNOSIS — Z23 Encounter for immunization: Secondary | ICD-10-CM

## 2012-11-28 ENCOUNTER — Telehealth: Payer: Self-pay | Admitting: Family Medicine

## 2012-11-28 NOTE — Telephone Encounter (Signed)
Okay to increase dose to 40 mg daily. Okay to call in a new prescription for furosemide 40 mg #90

## 2012-11-28 NOTE — Telephone Encounter (Signed)
Pt states that her 20mg  lasix is not working - would like to go back on the 40mg . Please advise.

## 2012-11-29 MED ORDER — FUROSEMIDE 40 MG PO TABS: 40.0000 mg | ORAL_TABLET | Freq: Every day | ORAL | Status: DC

## 2012-11-29 NOTE — Telephone Encounter (Signed)
Spoke to pt told her okay to increase Furosemide to 40 mg daily and I sent new Rx to pharmacy for her. Pt verbalized understanding.

## 2012-12-07 ENCOUNTER — Other Ambulatory Visit: Payer: Self-pay | Admitting: *Deleted

## 2012-12-07 MED ORDER — METHOCARBAMOL 500 MG PO TABS: 500.0000 mg | ORAL_TABLET | Freq: Three times a day (TID) | ORAL | Status: DC | PRN

## 2012-12-31 ENCOUNTER — Other Ambulatory Visit: Payer: Self-pay | Admitting: Internal Medicine

## 2013-01-12 ENCOUNTER — Ambulatory Visit (INDEPENDENT_AMBULATORY_CARE_PROVIDER_SITE_OTHER): Payer: Medicare Other | Admitting: Family Medicine

## 2013-01-12 ENCOUNTER — Encounter: Payer: Self-pay | Admitting: Family Medicine

## 2013-01-12 VITALS — BP 120/80 | HR 79 | Temp 97.7°F | Wt 160.0 lb

## 2013-01-12 DIAGNOSIS — R609 Edema, unspecified: Secondary | ICD-10-CM

## 2013-01-12 DIAGNOSIS — J069 Acute upper respiratory infection, unspecified: Secondary | ICD-10-CM

## 2013-01-12 MED ORDER — FUROSEMIDE 40 MG PO TABS: 40.0000 mg | ORAL_TABLET | Freq: Every day | ORAL | Status: DC

## 2013-01-12 MED ORDER — GUAIFENESIN-CODEINE 100-10 MG/5ML PO SYRP: 5.0000 mL | ORAL_SOLUTION | Freq: Every evening | ORAL | Status: DC | PRN

## 2013-01-12 NOTE — Patient Instructions (Addendum)

## 2013-01-12 NOTE — Progress Notes (Signed)
Chief Complaint  Patient presents with  . Cough    sore throat, chest congestion     HPI:  -started: 2-3 days ago -symptoms:nasal congestion, sore throat, cough -denies:fever, SOB, NVD, tooth pain -has tried: musinex, robitussin -sick contacts:  Husband with a cold -Hx of: no hx of lung disease  Edema: -on lasix 40mg , but pharmacy keeps filling as 20mg  - needs to have this corrected - confirmed per PCP report  ROS: See pertinent positives and negatives per HPI.  Past Medical History  Diagnosis Date  . ALLERGIC RHINITIS 06/02/2007  . DEPRESSION 06/02/2007  . GOITER 06/02/2007  . INSOMNIA 06/02/2007  . LOW BACK PAIN 02/18/2009  . OSTEOPENIA 02/18/2009  . Arthritis   . HYPOTHYROIDISM 06/02/2007    GOITER  . Swelling     No family history on file.  History   Social History  . Marital Status: Married    Spouse Name: N/A    Number of Children: N/A  . Years of Education: N/A   Social History Main Topics  . Smoking status: Never Smoker   . Smokeless tobacco: Never Used  . Alcohol Use: No  . Drug Use: No  . Sexually Active: None   Other Topics Concern  . None   Social History Narrative  . None    Current outpatient prescriptions:alprazolam (XANAX) 2 MG tablet, One half or one tablet twice daily as needed, Disp: 180 tablet, Rfl: 1;  b complex vitamins tablet, Take 1 tablet by mouth daily., Disp: , Rfl: ;  doxycycline (VIBRA-TABS) 100 MG tablet, Take 1 tablet (100 mg total) by mouth 2 (two) times daily., Disp: 20 tablet, Rfl: 0 ferrous sulfate 325 (65 FE) MG tablet, Take 1 tablet (325 mg total) by mouth 3 (three) times daily after meals., Disp: , Rfl: ;  fish oil-omega-3 fatty acids 1000 MG capsule, Take 1 g by mouth 2 (two) times a week., Disp: , Rfl: ;  fluticasone (FLONASE) 50 MCG/ACT nasal spray, Place 2 sprays into the nose daily as needed. For allergies, Disp: 16 g, Rfl: 5 furosemide (LASIX) 40 MG tablet, Take 1 tablet (40 mg total) by mouth daily., Disp: 90 tablet, Rfl: 0;   HYDROcodone-acetaminophen (NORCO) 7.5-325 MG per tablet, Post surgical, Disp: , Rfl: ;  ibuprofen (ADVIL,MOTRIN) 600 MG tablet, Post surgical, Disp: , Rfl: ;  levothyroxine (SYNTHROID, LEVOTHROID) 75 MCG tablet, Take 1 tablet (75 mcg total) by mouth daily with breakfast., Disp: 90 tablet, Rfl: 6 methocarbamol (ROBAXIN) 500 MG tablet, Take 1 tablet (500 mg total) by mouth 3 (three) times daily as needed. For muscle spasms, Disp: 90 tablet, Rfl: 1;  Multiple Vitamin (MULITIVITAMIN WITH MINERALS) TABS, Take 1 tablet by mouth daily., Disp: , Rfl: ;  oxyCODONE (OXY IR/ROXICODONE) 5 MG immediate release tablet, Post surgical, Disp: , Rfl: ;  predniSONE (DELTASONE) 20 MG tablet, Take 2 tablets (40 mg total) by mouth daily., Disp: 10 tablet, Rfl: 0 sertraline (ZOLOFT) 50 MG tablet, Take 1 tablet (50 mg total) by mouth daily with breakfast., Disp: 90 tablet, Rfl: 6;  triamcinolone cream (KENALOG) 0.1 %, Apply topically 2 (two) times daily., Disp: 30 g, Rfl: 0;  vitamin C (ASCORBIC ACID) 500 MG tablet, Take 500 mg by mouth daily., Disp: , Rfl: ;  vitamin E 400 UNIT capsule, Take 400 Units by mouth daily., Disp: , Rfl:  guaiFENesin-codeine (ROBITUSSIN AC) 100-10 MG/5ML syrup, Take 5 mLs by mouth at bedtime as needed for cough., Disp: 120 mL, Rfl: 0  EXAM:  Filed  Vitals:   01/12/13 0936  BP: 120/80  Pulse: 79  Temp: 97.7 F (36.5 C)    There is no height on file to calculate BMI.  GENERAL: vitals reviewed and listed above, alert, oriented, appears well hydrated and in no acute distress  HEENT: atraumatic, conjunttiva clear, no obvious abnormalities on inspection of external nose and ears, normal appearance of ear canals and TMs, clear nasal congestion, mild post oropharyngeal erythema with PND, no tonsillar edema or exudate, no sinus TTP  NECK: no obvious masses on inspection  LUNGS: clear to auscultation bilaterally, no wheezes, rales or rhonchi, good air movement  CV: HRRR, no peripheral edema  MS:  moves all extremities without noticeable abnormality  PSYCH: pleasant and cooperative, no obvious depression or anxiety  ASSESSMENT AND PLAN:  Discussed the following assessment and plan:  1. Upper respiratory infection   2. Edema    -likely viral, return precautions and supportive care, cough medication provided - risks discussed -refilled lasix at current dose per phone notes with provided -Patient advised to return or notify a doctor immediately if symptoms worsen or persist or new concerns arise.  Patient Instructions  INSTRUCTIONS FOR UPPER RESPIRATORY INFECTION:  -plenty of rest and fluids  -nasal saline wash 2-3 times daily (use prepackaged nasal saline or bottled/distilled water if making your own)   -can use sinex nasal spray for drainage and nasal congestion - but do NOT use longer then 3-4 days  -can use tylenol or ibuprofen as directed for aches and sorethroat  -in the winter time, using a humidifier at night is helpful (please follow cleaning instructions)  -if you are taking a cough medication - use only as directed, may also try a teaspoon of honey to coat the throat and throat lozenges  -for sore throat, salt water gargles can help  -follow up if you have fevers, facial pain, tooth pain, difficulty breathing or are worsening or not getting better in 5-7 days      Aydeen Blume R.

## 2013-02-03 ENCOUNTER — Other Ambulatory Visit: Payer: Self-pay | Admitting: Internal Medicine

## 2013-02-08 ENCOUNTER — Telehealth: Payer: Self-pay | Admitting: Internal Medicine

## 2013-02-08 MED ORDER — MELOXICAM 7.5 MG PO TABS: 7.5000 mg | ORAL_TABLET | Freq: Every day | ORAL | Status: DC | PRN

## 2013-02-08 NOTE — Telephone Encounter (Signed)
Patient is on Methocarbamol. Her new insurance does not have that on their formulary. Preferred meds are: MELOXICAM, DICLOFENAC, CELEBREX (as last resort). Please advise if switch to a preferred is possible, or if reasons why not, let me know and I can attempt to get it covered. Thank you.

## 2013-02-08 NOTE — Telephone Encounter (Signed)
Left detailed message that Rx was called into pharmacy Mobic to replace Methocarbamol.

## 2013-02-08 NOTE — Telephone Encounter (Signed)
MOBIC 7.5 #60 one daily when necessary pain

## 2013-02-11 NOTE — Telephone Encounter (Signed)
Spoke to pt's husband told him Rx was sent to pharmacy [facility] for Mobic to replace Methocarbamol for her. He verbalized understanding and stated they got a phone call and is out now picking up med. Told him okay.

## 2013-02-13 ENCOUNTER — Ambulatory Visit (INDEPENDENT_AMBULATORY_CARE_PROVIDER_SITE_OTHER): Payer: Medicare Other | Admitting: Internal Medicine

## 2013-02-13 ENCOUNTER — Encounter: Payer: Self-pay | Admitting: Internal Medicine

## 2013-02-13 VITALS — BP 120/80 | HR 72 | Temp 97.8°F | Resp 18 | Wt 162.0 lb

## 2013-02-13 DIAGNOSIS — J309 Allergic rhinitis, unspecified: Secondary | ICD-10-CM

## 2013-02-13 DIAGNOSIS — I10 Essential (primary) hypertension: Secondary | ICD-10-CM

## 2013-02-13 DIAGNOSIS — K219 Gastro-esophageal reflux disease without esophagitis: Secondary | ICD-10-CM

## 2013-02-13 DIAGNOSIS — E039 Hypothyroidism, unspecified: Secondary | ICD-10-CM

## 2013-02-13 DIAGNOSIS — F329 Major depressive disorder, single episode, unspecified: Secondary | ICD-10-CM

## 2013-02-13 MED ORDER — LEVOTHYROXINE SODIUM 75 MCG PO TABS: 75.0000 ug | ORAL_TABLET | Freq: Every day | ORAL | Status: DC

## 2013-02-13 MED ORDER — SERTRALINE HCL 50 MG PO TABS: 50.0000 mg | ORAL_TABLET | Freq: Every day | ORAL | Status: DC

## 2013-02-13 MED ORDER — FUROSEMIDE 40 MG PO TABS: 40.0000 mg | ORAL_TABLET | Freq: Every day | ORAL | Status: DC

## 2013-02-13 MED ORDER — FLUTICASONE PROPIONATE 50 MCG/ACT NA SUSP: 2.0000 | Freq: Every day | NASAL | Status: DC | PRN

## 2013-02-13 MED ORDER — ALPRAZOLAM 2 MG PO TABS: ORAL_TABLET | ORAL | Status: DC

## 2013-02-13 MED ORDER — MELOXICAM 7.5 MG PO TABS: 7.5000 mg | ORAL_TABLET | Freq: Every day | ORAL | Status: DC | PRN

## 2013-02-13 NOTE — Patient Instructions (Signed)
Limit your sodium (Salt) intake    It is important that you exercise regularly, at least 20 minutes 3 to 4 times per week.  If you develop chest pain or shortness of breath seek  medical attention.  Return in 3 months for follow-up  

## 2013-02-13 NOTE — Progress Notes (Signed)
Subjective:    Patient ID: April Brewer, female    DOB: May 30, 1946, 67 y.o.   MRN: [id number]  HPI  67 year old patient who is seen today for followup. She has a history of goiter and hypothyroidism and remains on supplemental levothyroxin. She has depression and chronic insomnia. She is followed by a pain management. She has treated hypertension allergic rhinitis and a history of gastro-esophageal reflux disease. She is doing reasonably well. She requested a written refills on all her medications  Past Medical History  Diagnosis Date  . ALLERGIC RHINITIS 06/02/2007  . DEPRESSION 06/02/2007  . GOITER 06/02/2007  . INSOMNIA 06/02/2007  . LOW BACK PAIN 02/18/2009  . OSTEOPENIA 02/18/2009  . Arthritis   . HYPOTHYROIDISM 06/02/2007    GOITER  . Swelling     History   Social History  . Marital Status: Married    Spouse Name: N/A    Number of Children: N/A  . Years of Education: N/A   Occupational History  . Not on file.   Social History Main Topics  . Smoking status: Never Smoker   . Smokeless tobacco: Never Used  . Alcohol Use: No  . Drug Use: No  . Sexually Active: Not on file   Other Topics Concern  . Not on file   Social History Narrative  . No narrative on file    Past Surgical History  Procedure Laterality Date  . Abdominal hysterectomy    . Eye surgery    . Cholecystectomy    . Shoulder surgery      right  . Back surgery      X 5  . 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  No Known Allergies  Current Outpatient Prescriptions on File Prior to Visit  Medication Sig Dispense Refill  . ibuprofen (ADVIL,MOTRIN) 600 MG tablet Post surgical      . oxyCODONE (OXY IR/ROXICODONE) 5 MG immediate release tablet Post surgical      . triamcinolone cream (KENALOG) 0.1 % Apply topically 2 (two) times  daily.  30 g  0   No current facility-administered medications on file prior to visit.    BP 120/80  Pulse 72  Temp(Src) 97.8 F (36.6 C) (Oral)  Resp 18  Wt 162 lb (73.483 kg)  BMI 28.7 kg/m2  SpO2 97%       Review of Systems  Constitutional: Negative.   HENT: Negative for hearing loss, congestion, sore throat, rhinorrhea, dental problem, sinus pressure and tinnitus.   Eyes: Negative for pain, discharge and visual disturbance.  Respiratory: Negative for cough and shortness of breath.   Cardiovascular: Negative for chest pain, palpitations and leg swelling.  Gastrointestinal: Negative for nausea, vomiting, abdominal pain, diarrhea, constipation, blood in stool and abdominal distention.  Genitourinary: Negative for dysuria, urgency, frequency, hematuria, flank pain, vaginal bleeding, vaginal discharge, difficulty urinating, vaginal pain and pelvic pain.  Musculoskeletal: Positive for back pain and arthralgias. Negative for joint swelling and gait problem.  Skin: Negative for rash.  Neurological: Negative for dizziness, syncope, speech difficulty, weakness, numbness and headaches.  Hematological: Negative for adenopathy.  Psychiatric/Behavioral: Positive for sleep disturbance and dysphoric mood. Negative for behavioral problems and agitation. The patient is nervous/anxious.        Objective:   Physical Exam  Constitutional: She is oriented to person, place,  and time. She appears well-developed and well-nourished.  HENT:  Head: Normocephalic.  Right Ear: External ear normal.  Left Ear: External ear normal.  Mouth/Throat: Oropharynx is clear and moist.  Eyes: Conjunctivae and EOM are normal. Pupils are equal, round, and reactive to light.  Neck: Normal range of motion. Neck supple. No thyromegaly present.  Cardiovascular: Normal rate, regular rhythm, normal heart sounds and intact distal pulses.   Pulmonary/Chest: Effort normal and breath sounds normal.  Abdominal: Soft.  Bowel sounds are normal. She exhibits no mass. There is no tenderness.  Musculoskeletal: Normal range of motion.  Lymphadenopathy:    She has no cervical adenopathy.  Neurological: She is alert and oriented to person, place, and time.  Skin: Skin is warm and dry. No rash noted.  Psychiatric: She has a normal mood and affect. Her behavior is normal.          Assessment & Plan:   Hypertension well controlled. We'll continue present regimen Hypothyroidism. The levothyroxin refill Anxiety disorder/chronic insomnia. Alprazolam refilled Depression. Stable  Schedule CPX in 3 months All medications refilled

## 2013-03-03 ENCOUNTER — Other Ambulatory Visit: Payer: Self-pay | Admitting: Family Medicine

## 2013-03-03 ENCOUNTER — Other Ambulatory Visit: Payer: Self-pay | Admitting: Internal Medicine

## 2013-03-05 NOTE — Telephone Encounter (Signed)
Ok to RF? 

## 2013-03-23 ENCOUNTER — Ambulatory Visit (INDEPENDENT_AMBULATORY_CARE_PROVIDER_SITE_OTHER): Payer: Medicare Other | Admitting: Internal Medicine

## 2013-03-23 ENCOUNTER — Encounter: Payer: Self-pay | Admitting: Internal Medicine

## 2013-03-23 VITALS — BP 146/80 | HR 90 | Temp 97.5°F | Resp 20 | Wt 159.0 lb

## 2013-03-23 DIAGNOSIS — G4700 Insomnia, unspecified: Secondary | ICD-10-CM

## 2013-03-23 DIAGNOSIS — I10 Essential (primary) hypertension: Secondary | ICD-10-CM

## 2013-03-23 DIAGNOSIS — J309 Allergic rhinitis, unspecified: Secondary | ICD-10-CM

## 2013-03-23 DIAGNOSIS — F329 Major depressive disorder, single episode, unspecified: Secondary | ICD-10-CM

## 2013-03-23 DIAGNOSIS — F3289 Other specified depressive episodes: Secondary | ICD-10-CM

## 2013-03-23 MED ORDER — PREDNISONE 10 MG PO TABS: 10.0000 mg | ORAL_TABLET | Freq: Two times a day (BID) | ORAL | Status: DC

## 2013-03-23 MED ORDER — FLUTICASONE PROPIONATE 50 MCG/ACT NA SUSP: 2.0000 | Freq: Every day | NASAL | Status: DC

## 2013-03-23 NOTE — Progress Notes (Signed)
Subjective:    Patient ID: April Brewer, female    DOB: 02/18/1946, 67 y.o.   MRN: [id number]  HPI  67 year old patient who is seen today complaining of worsening head and sinus congestion. She does have a history of allergic rhinitis. She has arthritis and has had recent the left total hip surgery and is followed by chronic pain management. She has chronic anxiety and has been on alprazolam. She also remains on sertraline. She complains of insomnia which has been a chronic problem.  Past Medical History  Diagnosis Date  . ALLERGIC RHINITIS 06/02/2007  . DEPRESSION 06/02/2007  . GOITER 06/02/2007  . INSOMNIA 06/02/2007  . LOW BACK PAIN 02/18/2009  . OSTEOPENIA 02/18/2009  . Arthritis   . HYPOTHYROIDISM 06/02/2007    GOITER  . Swelling     History   Social History  . Marital Status: Married    Spouse Name: N/A    Number of Children: N/A  . Years of Education: N/A   Occupational History  . Not on file.   Social History Main Topics  . Smoking status: Never Smoker   . Smokeless tobacco: Never Used  . Alcohol Use: No  . Drug Use: No  . Sexually Active: Not on file   Other Topics Concern  . Not on file   Social History Narrative  . No narrative on file    Past Surgical History  Procedure Laterality Date  . Abdominal hysterectomy    . Eye surgery    . Cholecystectomy    . Shoulder surgery      right  . Back surgery      X 5  . 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  No Known Allergies  Current Outpatient Prescriptions on File Prior to Visit  Medication Sig Dispense Refill  . alprazolam (XANAX) 2 MG tablet One half or one tablet twice daily as needed  180 tablet  1  . fluticasone (FLONASE) 50 MCG/ACT nasal spray Place 2 sprays into the nose daily as needed. For allergies  16 g  5  . furosemide  (LASIX) 40 MG tablet Take 1 tablet (40 mg total) by mouth daily.  90 tablet  0  . ibuprofen (ADVIL,MOTRIN) 600 MG tablet Post surgical      . levothyroxine (SYNTHROID, LEVOTHROID) 75 MCG tablet Take 1 tablet (75 mcg total) by mouth daily with breakfast.  90 tablet  6  . meloxicam (MOBIC) 7.5 MG tablet Take 1 tablet (7.5 mg total) by mouth daily as needed.  90 tablet  4  . methocarbamol (ROBAXIN) 500 MG tablet TAKE ONE TABLET BY MOUTH THREE TIMES DAILY AS NEEDED FOR MUSCLE SPASM  90 tablet  0  . oxyCODONE (OXY IR/ROXICODONE) 5 MG immediate release tablet Post surgical      . sertraline (ZOLOFT) 50 MG tablet Take 1 tablet (50 mg total) by mouth daily with breakfast.  90 tablet  6   No current facility-administered medications on file prior to visit.    BP 146/80  Pulse 90  Temp(Src) 97.5 F (36.4 C) (Oral)  Resp 20  Wt 159 lb (72.122 kg)  BMI 28.17 kg/m2  SpO2 95%       Review of Systems  Constitutional: Negative.  Negative for fever.  HENT: Positive for congestion, rhinorrhea, postnasal drip and sinus  pressure. Negative for hearing loss, sore throat, dental problem and tinnitus.   Eyes: Negative for pain, discharge and visual disturbance.  Respiratory: Negative for cough and shortness of breath.   Cardiovascular: Negative for chest pain, palpitations and leg swelling.  Gastrointestinal: Negative for nausea, vomiting, abdominal pain, diarrhea, constipation, blood in stool and abdominal distention.  Genitourinary: Negative for dysuria, urgency, frequency, hematuria, flank pain, vaginal bleeding, vaginal discharge, difficulty urinating, vaginal pain and pelvic pain.  Musculoskeletal: Negative for joint swelling, arthralgias and gait problem.  Skin: Negative for rash.  Neurological: Negative for dizziness, syncope, speech difficulty, weakness, numbness and headaches.  Hematological: Negative for adenopathy.  Psychiatric/Behavioral: Positive for sleep disturbance. Negative for  behavioral problems, dysphoric mood and agitation. The patient is nervous/anxious.        Objective:   Physical Exam  Constitutional: She is oriented to person, place, and time. She appears well-developed and well-nourished.  HENT:  Head: Normocephalic.  Right Ear: External ear normal.  Left Ear: External ear normal.  Mouth/Throat: Oropharynx is clear and moist.  Eyes: Conjunctivae and EOM are normal. Pupils are equal, round, and reactive to light.  Neck: Normal range of motion. Neck supple. No thyromegaly present.  Cardiovascular: Normal rate, regular rhythm, normal heart sounds and intact distal pulses.   Pulmonary/Chest: Effort normal and breath sounds normal.  Abdominal: Soft. Bowel sounds are normal. She exhibits no mass. There is no tenderness.  Musculoskeletal: Normal range of motion.  Lymphadenopathy:    She has no cervical adenopathy.  Neurological: She is alert and oriented to person, place, and time.  Skin: Skin is warm and dry. No rash noted.  Psychiatric: She has a normal mood and affect. Her behavior is normal.          Assessment & Plan:   Allergic rhinitis-flare. Will treat with short-term oral prednisone and started on a nasal steroid. Samples of Nasonex dispense. A new prescription for fluticasone administered Anxiety. We'll continue present regimen. Information concerning behavioral health referral also given and discussed Osteoarthritis. Follow chronic pain management

## 2013-03-23 NOTE — Patient Instructions (Signed)
Use saline irrigation, warm  moist compresses and over-the-counter decongestants only as directed.  Call if there is no improvement in 5 to 7 days, or sooner if you develop increasing pain, fever, or any new symptoms. 

## 2013-04-20 ENCOUNTER — Other Ambulatory Visit: Payer: Self-pay | Admitting: *Deleted

## 2013-04-21 ENCOUNTER — Encounter: Payer: Self-pay | Admitting: Family Medicine

## 2013-04-21 ENCOUNTER — Ambulatory Visit (INDEPENDENT_AMBULATORY_CARE_PROVIDER_SITE_OTHER): Payer: Medicare Other | Admitting: Family Medicine

## 2013-04-21 VITALS — BP 110/80 | HR 71 | Temp 97.6°F | Ht 62.5 in | Wt 162.5 lb

## 2013-04-21 DIAGNOSIS — D649 Anemia, unspecified: Secondary | ICD-10-CM

## 2013-04-21 DIAGNOSIS — E039 Hypothyroidism, unspecified: Secondary | ICD-10-CM

## 2013-04-21 DIAGNOSIS — R609 Edema, unspecified: Secondary | ICD-10-CM

## 2013-04-21 LAB — BASIC METABOLIC PANEL
BUN: 11 mg/dL (ref 6–23)
GFR: 80.45 mL/min (ref >60.00)
Potassium: 3.8 mEq/L (ref 3.5–5.1)
Sodium: 130 mEq/L — ABNORMAL LOW (ref 135–145)

## 2013-04-21 LAB — CBC WITH DIFFERENTIAL/PLATELET
Basophils Absolute: 0 10*3/uL (ref 0.0–0.1)
Eosinophils Absolute: 0.3 10*3/uL (ref 0.0–0.7)
Lymphocytes Relative: 32.5 % (ref 12.0–46.0)
MCHC: 33.7 g/dL (ref 30.0–36.0)
Neutrophils Relative %: 47.6 % (ref 43.0–77.0)
RDW: 13.7 % (ref 11.5–14.6)

## 2013-04-21 LAB — FERRITIN: Ferritin: 45.6 ng/mL (ref 10.0–291.0)

## 2013-04-21 LAB — IBC PANEL: Iron: 139 ug/dL (ref 42–145)

## 2013-04-21 MED ORDER — CETIRIZINE HCL 10 MG PO TABS: 10.0000 mg | ORAL_TABLET | Freq: Every day | ORAL | Status: DC

## 2013-04-21 MED ORDER — ALPRAZOLAM 2 MG PO TABS: ORAL_TABLET | ORAL | Status: DC

## 2013-04-21 MED ORDER — SERTRALINE HCL 100 MG PO TABS: ORAL_TABLET | ORAL | Status: DC

## 2013-04-21 NOTE — Progress Notes (Signed)
OFFICE NOTE  04/21/2013  CC:  Chief Complaint  Patient presents with  . Establish Care     HPI: Patient is a 67 y.o. Caucasian female who is here to transfer care/establish with me.  Formerly saw Dr. Kirtland Bouchard at [facility], wants to come here b/c it is more convenient to her home.  C/o some sneezing and runny nose lately with the increased pollen, slight cough but no wheeze or SOB.  Reviewed chronic pain syndrome: various joint pains--back and hips.  Sees Dr. Ethelene Hal at [facility] for pain mgmt (oxycodone).  Says insomnia has been a longstanding problem with her, also seems to describe suboptimally treated depression/anxiety.  Has been on zoloft 50mg  for > 52yr per her report.  Pertinent PMH:  Past Medical History  Diagnosis Date  . ALLERGIC RHINITIS 06/02/2007  . DEPRESSION 06/02/2007  . GOITER 06/02/2007  . INSOMNIA 06/02/2007  . LOW BACK PAIN 02/18/2009  . OSTEOPENIA 02/18/2009  . Arthritis   . HYPOTHYROIDISM 06/02/2007    GOITER  . Swelling    Past Surgical History  Procedure Laterality Date  . Abdominal hysterectomy    . Eye surgery    . Cholecystectomy    . Shoulder surgery      right  . Back surgery      X 5  . Total hip arthroplasty  05/31/2012    Procedure: TOTAL HIP ARTHROPLASTY ANTERIOR APPROACH;  Surgeon: [name], MD;  Location: [facility];  Service: Orthopedics;  Laterality: Left;  . Esophagogastroduodenoscopy  10/2007    Small hiatal hernia, o/w normal.  . Colonoscopy  06/2007    Diverticulosis, o/w normal.    MEDS:  Outpatient Prescriptions Prior to Visit  Medication Sig Dispense Refill  . alprazolam (XANAX) 2 MG tablet One half or one tablet twice daily as needed  180 tablet  1  . fluticasone (FLONASE) 50 MCG/ACT nasal spray Place 2 sprays into the nose daily.  16 g  6  . ibuprofen (ADVIL,MOTRIN) 600 MG tablet Post surgical      . levothyroxine (SYNTHROID, LEVOTHROID) 75 MCG tablet Take 1 tablet (75 mcg total) by mouth daily with breakfast.  90 tablet  6   . oxyCODONE (OXY IR/ROXICODONE) 5 MG immediate release tablet as needed. Post surgical      . sertraline (ZOLOFT) 50 MG tablet Take 1 tablet (50 mg total) by mouth daily with breakfast.  90 tablet  6  . fluticasone (FLONASE) 50 MCG/ACT nasal spray Place 2 sprays into the nose daily as needed. For allergies  16 g  5  . furosemide (LASIX) 40 MG tablet Take 1 tablet (40 mg total) by mouth daily.  90 tablet  0  . meloxicam (MOBIC) 7.5 MG tablet Take 1 tablet (7.5 mg total) by mouth daily as needed.  90 tablet  4  . methocarbamol (ROBAXIN) 500 MG tablet TAKE ONE TABLET BY MOUTH THREE TIMES DAILY AS NEEDED FOR MUSCLE SPASM  90 tablet  0  . predniSONE (DELTASONE) 10 MG tablet Take 1 tablet (10 mg total) by mouth 2 (two) times daily.  14 tablet  0   No facility-administered medications prior to visit.    PE: Blood pressure 110/80, pulse 71, temperature 97.6 F (36.4 C), temperature source Oral, height 5' 2.5" (1.588 m), weight 162 lb 8 oz (73.71 kg), SpO2 98.00%. Gen: Alert, well appearing.  Patient is oriented to person, place, time, and situation. ENT:  Eyes: no injection, icteris, swelling, or exudate.  EOMI, PERRLA. Nose:  no drainage or turbinate edema/swelling.  No injection or focal lesion.  Mouth: lips without lesion/swelling.  Oral mucosa pink and moist. Oropharynx without erythema, exudate, or swelling.  CV: RRR, 2/6 syst ejection murmur LUNGS: CTA bilat, nonlabored ABD: soft, NT, ND, BS normal.  No hepatospenomegaly or mass.  No bruits. EXT: 1+ doughy edema with trace pitting edema in pretibial regions/ankles bilat.  Slight pinkish maculopap rash in right lower ant tib region   IMPRESSION AND PLAN: TRANSFER PT:   1) Hx of anemia: last labs 05/2012.  Pt reports taking only a few iron tabs at that time.  Will check CBC and iron labs. 2) Hypothyroidism: check TSH today. 3) All rhinitis: change to zyrtec 10mg  qd. 4) Depression/anxiety/insomnia: increase zoloft to 100 mg qhs. 5) Periph  edema/chronic venous insuff: takes daily lasixs.  Check lytes/cr today.  FOLLOW UP: 1 mo, f/u anxiety/dep/insomnia

## 2013-04-21 NOTE — Patient Instructions (Signed)
Buy over the counter Nasacort nasal spray and use 2 sprays in each nostril once daily in the morning.

## 2013-04-28 ENCOUNTER — Encounter: Payer: Self-pay | Admitting: Family Medicine

## 2013-05-19 ENCOUNTER — Encounter: Payer: Self-pay | Admitting: Family Medicine

## 2013-05-19 ENCOUNTER — Ambulatory Visit (INDEPENDENT_AMBULATORY_CARE_PROVIDER_SITE_OTHER): Payer: Medicare Other | Admitting: Family Medicine

## 2013-05-19 VITALS — BP 125/83 | HR 70 | Temp 97.7°F | Resp 14 | Wt 161.5 lb

## 2013-05-19 DIAGNOSIS — E871 Hypo-osmolality and hyponatremia: Secondary | ICD-10-CM

## 2013-05-19 DIAGNOSIS — G4700 Insomnia, unspecified: Secondary | ICD-10-CM

## 2013-05-19 LAB — BASIC METABOLIC PANEL
Chloride: 97 mEq/L (ref 96–112)
Creatinine, Ser: 0.8 mg/dL (ref 0.4–1.2)
Sodium: 131 mEq/L — ABNORMAL LOW (ref 135–145)

## 2013-05-19 MED ORDER — FUROSEMIDE 40 MG PO TABS: ORAL_TABLET | ORAL | Status: DC

## 2013-05-19 MED ORDER — SERTRALINE HCL 100 MG PO TABS: ORAL_TABLET | ORAL | Status: DC

## 2013-05-19 NOTE — Assessment & Plan Note (Signed)
Presumed to be diuretic-induced. She has been on a lower dose of lasix for about a month now---recheck BMET today.

## 2013-05-19 NOTE — Progress Notes (Signed)
OFFICE NOTE  05/19/2013  CC:  Chief Complaint  Patient presents with  . Follow-up    1-mth. Lethea Killings; Insomnia]     HPI: Patient is a 67 y.o. Caucasian female who is here for 109mo f/u anxiety and insomnia. I increased her zoloft from 50mg  to 100mg  qd last visit.   Says she is having more nights of restful sleep than before.  No side effect.   Helping a bit with her daytime anxiety as well.  Still has to take 1/2 xanax usually daily for nerves in evening and sometimes even at bedtime. Says nasacort nasal spray helping since we switched her to this last visit.  Last visit her Na was a bit low and I decreased her to 20mg  qd lasix dosing, says LE edema a bit worse (L>R as per her usual) but certainly tolerable.  Pertinent PMH:  Past Medical History  Diagnosis Date  . ALLERGIC RHINITIS 06/02/2007  . DEPRESSION 06/02/2007  . INSOMNIA 06/02/2007  . LOW BACK PAIN 02/18/2009  . OSTEOPENIA 02/18/2009  . Arthritis   . HYPOTHYROIDISM 06/02/2007    GOITER  . Peripheral edema     LE's, nonpitting   Past surgical, social, and family history reviewed and no changes noted since last office visit.  MEDS:  Outpatient Prescriptions Prior to Visit  Medication Sig Dispense Refill  . alprazolam (XANAX) 2 MG tablet One half or one tablet twice daily as needed  180 tablet  1  . cetirizine (ZYRTEC) 10 MG tablet Take 1 tablet (10 mg total) by mouth daily.  30 tablet  6  . fluticasone (FLONASE) 50 MCG/ACT nasal spray Place 2 sprays into the nose daily.  16 g  6  . ibuprofen (ADVIL,MOTRIN) 600 MG tablet Post surgical      . levothyroxine (SYNTHROID, LEVOTHROID) 75 MCG tablet Take 1 tablet (75 mcg total) by mouth daily with breakfast.  90 tablet  6  . oxyCODONE (OXY IR/ROXICODONE) 5 MG immediate release tablet as needed. Post surgical      . furosemide (LASIX) 40 MG tablet Take 1 tablet (40 mg total) by mouth daily.  90 tablet  0  . sertraline (ZOLOFT) 100 MG tablet 1 tab po qhs  30 tablet  1   No  facility-administered medications prior to visit.    PE: Blood pressure 125/83, pulse 70, temperature 97.7 F (36.5 C), temperature source Oral, resp. rate 14, weight 161 lb 8 oz (73.256 kg), SpO2 98.00%. Gen: Alert, well appearing.  Patient is oriented to person, place, time, and situation. CV: RRR, 1-2/6 syst ejection murmur LUNGS: CTA bilat, nonlabored resps EXT: no clubbing, cyanosis, or edema ("doughy" softness present in LL's but no pitting)   LABS: none today    Chemistry      Component Value Date/Time   NA 130* 04/21/2013 1112   K 3.8 04/21/2013 1112   CL 94* 04/21/2013 1112   CO2 27 04/21/2013 1112   BUN 11 04/21/2013 1112   CREATININE 0.8 04/21/2013 1112      Component Value Date/Time   CALCIUM 9.3 04/21/2013 1112   ALKPHOS 83 12/16/2011 1029   AST 20 12/16/2011 1029   ALT 18 12/16/2011 1029   BILITOT 0.7 12/16/2011 1029       IMPRESSION AND PLAN:  INSOMNIA Her anxiety and insomnia are improved but there is still room for improvement and she is tolerating zoloft w/out any problem at all. Plan is to increase zoloft to 150mg  po qhs.  Hyponatremia Presumed to be  diuretic-induced. She has been on a lower dose of lasix for about a month now---recheck BMET today.   An After Visit Summary was printed and given to the patient.  FOLLOW UP: 3 mo

## 2013-05-19 NOTE — Assessment & Plan Note (Signed)
Her anxiety and insomnia are improved but there is still room for improvement and she is tolerating zoloft w/out any problem at all. Plan is to increase zoloft to 150mg  po qhs.

## 2013-06-28 ENCOUNTER — Telehealth: Payer: Self-pay | Admitting: Emergency Medicine

## 2013-06-28 NOTE — Telephone Encounter (Signed)
Patient needs refill on Lasix and Xanax and she also changed her pharmacy to [facility] on [address] in [place].

## 2013-06-28 NOTE — Telephone Encounter (Signed)
Refill request for Lasix  Last filled by MD on - 05/19/13 #45 x0 Last seen- 05/19/13 Next appointment- 08/23/13  Refill request for Xanax Last filled by MD on -04/21/13 #180 x1 Last seen-04/21/13 Next appointment- 08/23/13 Please advise refills?

## 2013-06-28 NOTE — Telephone Encounter (Signed)
OK to RF the lasix as previously prescribed with 3 additional RF's. However, with the xanax instructions of BID dosing, she should not run out of her 180 tabs until around 7/24, PLUS I gave 1 RF on that rx, so there is no way she should need a rf of this med yet.  Please clarify --thx

## 2013-06-29 MED ORDER — FUROSEMIDE 40 MG PO TABS: ORAL_TABLET | ORAL | Status: DC

## 2013-06-29 NOTE — Telephone Encounter (Signed)
Pt aware.  Lasix sent to pharmacy.

## 2013-07-06 ENCOUNTER — Other Ambulatory Visit: Payer: Self-pay | Admitting: Family Medicine

## 2013-07-06 MED ORDER — FUROSEMIDE 40 MG PO TABS: ORAL_TABLET | ORAL | Status: DC

## 2013-07-19 ENCOUNTER — Other Ambulatory Visit: Payer: Self-pay | Admitting: Family Medicine

## 2013-07-19 MED ORDER — FUROSEMIDE 40 MG PO TABS: ORAL_TABLET | ORAL | Status: DC

## 2013-08-23 ENCOUNTER — Ambulatory Visit (INDEPENDENT_AMBULATORY_CARE_PROVIDER_SITE_OTHER): Payer: Medicare Other | Admitting: Family Medicine

## 2013-08-23 ENCOUNTER — Encounter: Payer: Self-pay | Admitting: Family Medicine

## 2013-08-23 VITALS — BP 118/81 | HR 74 | Temp 97.6°F | Resp 16 | Ht 63.0 in | Wt 167.0 lb

## 2013-08-23 DIAGNOSIS — F329 Major depressive disorder, single episode, unspecified: Secondary | ICD-10-CM

## 2013-08-23 DIAGNOSIS — R600 Localized edema: Secondary | ICD-10-CM | POA: Insufficient documentation

## 2013-08-23 DIAGNOSIS — E871 Hypo-osmolality and hyponatremia: Secondary | ICD-10-CM

## 2013-08-23 DIAGNOSIS — R609 Edema, unspecified: Secondary | ICD-10-CM

## 2013-08-23 DIAGNOSIS — F341 Dysthymic disorder: Secondary | ICD-10-CM

## 2013-08-23 DIAGNOSIS — I872 Venous insufficiency (chronic) (peripheral): Secondary | ICD-10-CM | POA: Insufficient documentation

## 2013-08-23 NOTE — Progress Notes (Signed)
OFFICE NOTE  08/23/2013  CC:  Chief Complaint  Patient presents with  . Leg Swelling  . Insomnia     HPI: Patient is a 67 y.o. Caucasian female who is here for 3 mo f/u insomnia and anxiety. Last visit we increased her zoloft to 150mg  qd b/c she had responded moderately well to an increase from 50mg  to 100mg  a bit earlier in the year.  We also rechecked her Na level after cutting back on her lasix use a little and it was stable at 131.   Zyrtec helping a lot with her allergies.  Taking lasix 41m qd.  Sometimes takes 2 or 3 ta bs per day for LE swelling.  Drinks LARGE amounts of water every day. She has had this for years.  Gets up a lot to urinate daytime and nighttime.  Impairs her sleep quite a bit.  Pertinent PMH:  Past Medical History  Diagnosis Date  . ALLERGIC RHINITIS 06/02/2007  . DEPRESSION 06/02/2007  . INSOMNIA 06/02/2007  . LOW BACK PAIN 02/18/2009  . OSTEOPENIA 02/18/2009  . Arthritis   . HYPOTHYROIDISM 06/02/2007    GOITER  . Peripheral edema     LE's, nonpitting   Past surgical, social, and family history reviewed and no changes noted since last office visit.  MEDS:  Outpatient Prescriptions Prior to Visit  Medication Sig Dispense Refill  . alprazolam (XANAX) 2 MG tablet One half or one tablet twice daily as needed  180 tablet  1  . cetirizine (ZYRTEC) 10 MG tablet Take 1 tablet (10 mg total) by mouth daily.  30 tablet  6  . furosemide (LASIX) 40 MG tablet 1/2 tab po qd  45 tablet  1  . ibuprofen (ADVIL,MOTRIN) 600 MG tablet Post surgical      . levothyroxine (SYNTHROID, LEVOTHROID) 75 MCG tablet Take 1 tablet (75 mcg total) by mouth daily with breakfast.  90 tablet  6  . oxyCODONE (OXY IR/ROXICODONE) 5 MG immediate release tablet as needed. Post surgical      . sertraline (ZOLOFT) 100 MG tablet 1 and 1/2 tabs po qhs  45 tablet  2  . fluticasone (FLONASE) 50 MCG/ACT nasal spray Place 2 sprays into the nose daily.  16 g  6   No facility-administered medications  prior to visit.    PE: Blood pressure 118/81, pulse 74, temperature 97.6 F (36.4 C), temperature source Temporal, resp. rate 16, height 5\' 3"  (1.6 m), weight 167 lb (75.751 kg), SpO2 96.00%. Gen: Alert, well appearing.  Patient is oriented to person, place, time, and situation. No facial edema.  Hands without edema. LE's with a touch of doughy feel to anterior tibial surfaces but no pitting.  Scattered varicosities bilat are noted.  No rash or erythema.  IMPRESSION AND PLAN:  1) Anxiety and depression: stable.  Continue zoloft 150mg  qhs. 2) Insomnia--not improving with uptitrations of zoloft.  I think her sleep impairment is largely related to her nocturia. D/c lasix. 3) LE venous insuff: discussed nature of this dz (incurable) and that her over-reliance on lasix is doing her more harm than good (insomnia, low Na).  Low sodium diet handout discussed and given to pt. Use compression hose that she already has.  Elevate legs above level of heart qd - bid. 4) Hyponatremia: likely from lasix use and then subsequent overingestion of free water.  D/c lasix. Recheck Na next f/u visit in 1 mo   FOLLOW UP: 1 mo

## 2013-08-23 NOTE — Patient Instructions (Addendum)
Stop your lasix (furosemide). Start low sodium diet (see handout). Recheck in office in 1 mo (do not need to be fasting).

## 2013-08-29 ENCOUNTER — Other Ambulatory Visit: Payer: Self-pay | Admitting: Family Medicine

## 2013-09-22 ENCOUNTER — Ambulatory Visit (INDEPENDENT_AMBULATORY_CARE_PROVIDER_SITE_OTHER): Payer: Medicare Other | Admitting: Family Medicine

## 2013-09-22 ENCOUNTER — Encounter: Payer: Self-pay | Admitting: Family Medicine

## 2013-09-22 VITALS — BP 155/87 | HR 81 | Temp 98.6°F | Resp 16 | Ht 63.0 in | Wt 167.0 lb

## 2013-09-22 DIAGNOSIS — Z23 Encounter for immunization: Secondary | ICD-10-CM

## 2013-09-22 DIAGNOSIS — I872 Venous insufficiency (chronic) (peripheral): Secondary | ICD-10-CM

## 2013-09-22 DIAGNOSIS — E871 Hypo-osmolality and hyponatremia: Secondary | ICD-10-CM

## 2013-09-22 DIAGNOSIS — J309 Allergic rhinitis, unspecified: Secondary | ICD-10-CM

## 2013-09-22 LAB — BASIC METABOLIC PANEL
Chloride: 102 mEq/L (ref 96–112)
GFR: 77.98 mL/min (ref >60.00)
Glucose, Bld: 88 mg/dL (ref 70–99)
Potassium: 4.9 mEq/L (ref 3.5–5.1)
Sodium: 132 mEq/L — ABNORMAL LOW (ref 135–145)

## 2013-09-22 MED ORDER — ZOSTER VACCINE LIVE 19400 UNT/0.65ML ~~LOC~~ SOLR: 0.6500 mL | Freq: Once | SUBCUTANEOUS | Status: DC

## 2013-09-22 NOTE — Assessment & Plan Note (Signed)
Likely due to lasix overuse in the past. Will recheck this today since she has been off of lasix for 1 mo now.

## 2013-09-22 NOTE — Assessment & Plan Note (Signed)
Improved on zyrtec 10mg  qd.

## 2013-09-22 NOTE — Assessment & Plan Note (Signed)
Stable. Continue current measures: compression hose, elevation prn, activity prn, low Na diet. Doing fine off of diuretic.

## 2013-09-22 NOTE — Progress Notes (Signed)
OFFICE NOTE  09/22/2013  CC:  Chief Complaint  Patient presents with  . Follow-up     HPI: Patient is a 67 y.o. Caucasian female who is here for 1 mo f/u chronic LE venous insufficiency edema. Swelling in legs is slightly improved since getting off her lasix last visit. She notes that she gets up to urinate usually once at night, is thirsty still a lot.  No urinary frequency in daytime. Pt had orange juice today and a cup of black coffee (couple hours ago).  Pt says her zyrtec that I started her on for allergies has been helpful.    Pertinent PMH:  Past Medical History  Diagnosis Date  . ALLERGIC RHINITIS 06/02/2007  . DEPRESSION 06/02/2007  . INSOMNIA 06/02/2007  . LOW BACK PAIN 02/18/2009  . OSTEOPENIA 02/18/2009  . Arthritis   . HYPOTHYROIDISM 06/02/2007    GOITER  . Peripheral edema     LE's, nonpitting    MEDS:  Outpatient Prescriptions Prior to Visit  Medication Sig Dispense Refill  . alprazolam (XANAX) 2 MG tablet One half or one tablet twice daily as needed  180 tablet  1  . cetirizine (ZYRTEC) 10 MG tablet Take 1 tablet (10 mg total) by mouth daily.  30 tablet  6  . ibuprofen (ADVIL,MOTRIN) 600 MG tablet Post surgical      . levothyroxine (SYNTHROID, LEVOTHROID) 75 MCG tablet Take 1 tablet (75 mcg total) by mouth daily with breakfast.  90 tablet  6  . oxyCODONE (OXY IR/ROXICODONE) 5 MG immediate release tablet as needed. Post surgical      . sertraline (ZOLOFT) 100 MG tablet Take one and one-half tablet by mouth nightly at bedtime  45 tablet  1  . fluticasone (FLONASE) 50 MCG/ACT nasal spray Place 2 sprays into the nose daily.  16 g  6   No facility-administered medications prior to visit.    PE: Blood pressure 155/87, pulse 81, temperature 98.6 F (37 C), temperature source Temporal, resp. rate 16, height 5\' 3"  (1.6 m), weight 167 lb (75.751 kg), SpO2 96.00%. Gen: Alert, well appearing.  Patient is oriented to person, place, time, and situation. CV: RRR, 1/6  systolic flow murmur, w/out rub or gallop.   LUNGS: CTA bilat, nonlabored resps, good aeration in all lung fields. LEGS: trace pitting edema L lower leg, 1+ pitting edema in R LL.  Some varicose veins present but no skin pigment changes or rash.  IMPRESSION AND PLAN:  Venous insufficiency Stable. Continue current measures: compression hose, elevation prn, activity prn, low Na diet. Doing fine off of diuretic.  Hyponatremia Likely due to lasix overuse in the past. Will recheck this today since she has been off of lasix for 1 mo now.  ALLERGIC RHINITIS Improved on zyrtec 10mg  qd.  Flu vaccine IM today.  Zostavax rx printed for pt.  An After Visit Summary was printed and given to the patient.  FOLLOW UP: 45mo (fasting)

## 2013-10-19 ENCOUNTER — Other Ambulatory Visit: Payer: Self-pay | Admitting: Internal Medicine

## 2013-10-24 ENCOUNTER — Other Ambulatory Visit: Payer: Self-pay | Admitting: Family Medicine

## 2013-11-20 ENCOUNTER — Other Ambulatory Visit: Payer: Self-pay | Admitting: Family Medicine

## 2013-11-20 NOTE — Telephone Encounter (Signed)
Refill request for Zoloft Last filled by MD on - 10/24/13 #45 x0 Last Appt- 09/22/13 Next Appt- 03/22/13 Please advise refill?

## 2014-01-11 ENCOUNTER — Ambulatory Visit (INDEPENDENT_AMBULATORY_CARE_PROVIDER_SITE_OTHER): Payer: Medicare Other | Admitting: Internal Medicine

## 2014-01-11 ENCOUNTER — Encounter: Payer: Self-pay | Admitting: Internal Medicine

## 2014-01-11 ENCOUNTER — Ambulatory Visit (INDEPENDENT_AMBULATORY_CARE_PROVIDER_SITE_OTHER)
Admission: RE | Admit: 2014-01-11 | Discharge: 2014-01-11 | Disposition: A | Discharge status: Home / Self Care | Payer: Medicare Other | Source: Ambulatory Visit | Attending: Internal Medicine | Admitting: Internal Medicine

## 2014-01-11 ENCOUNTER — Other Ambulatory Visit (INDEPENDENT_AMBULATORY_CARE_PROVIDER_SITE_OTHER): Payer: Medicare Other

## 2014-01-11 VITALS — BP 140/80 | HR 83 | Temp 97.5°F | Ht 64.0 in | Wt 174.0 lb

## 2014-01-11 DIAGNOSIS — R0989 Other specified symptoms and signs involving the circulatory and respiratory systems: Secondary | ICD-10-CM

## 2014-01-11 DIAGNOSIS — R053 Chronic cough: Secondary | ICD-10-CM

## 2014-01-11 DIAGNOSIS — R059 Cough, unspecified: Secondary | ICD-10-CM

## 2014-01-11 DIAGNOSIS — R0600 Dyspnea, unspecified: Secondary | ICD-10-CM

## 2014-01-11 DIAGNOSIS — R0609 Other forms of dyspnea: Secondary | ICD-10-CM

## 2014-01-11 DIAGNOSIS — R05 Cough: Secondary | ICD-10-CM

## 2014-01-11 LAB — BASIC METABOLIC PANEL WITH GFR
BUN: 15 mg/dL (ref 6–23)
CO2: 30 meq/L (ref 19–32)
Calcium: 9.7 mg/dL (ref 8.4–10.5)
Chloride: 94 meq/L — ABNORMAL LOW (ref 96–112)
Creatinine, Ser: 0.7 mg/dL (ref 0.4–1.2)
GFR: 82.79 mL/min (ref >60.00)
Glucose, Bld: 94 mg/dL (ref 70–99)
Potassium: 4.2 meq/L (ref 3.5–5.1)
Sodium: 131 meq/L — ABNORMAL LOW (ref 135–145)

## 2014-01-11 LAB — CBC WITH DIFFERENTIAL/PLATELET
Basophils Absolute: 0 K/uL (ref 0.0–0.1)
Basophils Relative: 0.6 % (ref 0.0–3.0)
Eosinophils Absolute: 0.5 K/uL (ref 0.0–0.7)
Eosinophils Relative: 7.8 % — ABNORMAL HIGH (ref 0.0–5.0)
HCT: 36.3 % (ref 36.0–46.0)
Hemoglobin: 12.4 g/dL (ref 12.0–15.0)
Lymphocytes Relative: 26.8 % (ref 12.0–46.0)
Lymphs Abs: 1.7 K/uL (ref 0.7–4.0)
MCHC: 34.2 g/dL (ref 30.0–36.0)
MCV: 91.3 fl (ref 78.0–100.0)
Monocytes Absolute: 0.9 K/uL (ref 0.1–1.0)
Monocytes Relative: 14.5 % — ABNORMAL HIGH (ref 3.0–12.0)
Neutro Abs: 3.2 K/uL (ref 1.4–7.7)
Neutrophils Relative %: 50.3 % (ref 43.0–77.0)
Platelets: 291 K/uL (ref 150.0–400.0)
RBC: 3.98 Mil/uL (ref 3.87–5.11)
RDW: 13.8 % (ref 11.5–14.6)
WBC: 6.3 K/uL (ref 4.5–10.5)

## 2014-01-11 LAB — BRAIN NATRIURETIC PEPTIDE: PRO B NATRI PEPTIDE: 29 pg/mL (ref 0.0–100.0)

## 2014-01-11 MED ORDER — AMOXICILLIN-POT CLAVULANATE 875-125 MG PO TABS
1.0000 | ORAL_TABLET | Freq: Two times a day (BID) | ORAL | Status: DC
Start: 2014-01-11 — End: 2014-02-01

## 2014-01-11 NOTE — Progress Notes (Signed)
   Subjective:    Patient ID: April Brewer, female    DOB: [birth year]   MRN: [id number]  HPI  65 yowf never smoker self referred 01/11/2014 for cough in setting of poorly controlled chronic rhinitis with freq nose bleeds dating back to at least 2008 per [facility] records.    01/11/2014 1st [facility] office visit/ Kiernan Atkerson cc abrupt onset around sometime in  Oct 2014 new onset hemoptysis assoc with bilateral nasal obst and espistaxis  and sob x walking slowly  around the drugstore.  Cough worse in am "about what's coming out of my nose.   No obvious day to day or daytime variabilty or assoc chronic cough or cp or chest tightness, subjective wheeze overt   hb symptoms. No unusual exp hx or h/o childhood pna/ asthma or knowledge of premature birth.  Sleeping ok without nocturnal   exacerbation  of respiratory  c/o's or need for noct saba. Also denies any obvious fluctuation of symptoms with weather or environmental changes or other aggravating or alleviating factors except as outlined above   Current Medications, Allergies, Complete Past Medical History, Past Surgical History, Family History, and Social History were reviewed in [employer] record.           Review of Systems  Constitutional: Negative for fever, chills and unexpected weight change.  HENT: Positive for sore throat. Negative for congestion, dental problem, ear pain, nosebleeds, postnasal drip, rhinorrhea, sinus pressure, sneezing, trouble swallowing and voice change.   Eyes: Negative for visual disturbance.  Respiratory: Positive for cough and shortness of breath. Negative for choking.   Cardiovascular: Negative for chest pain and leg swelling.  Gastrointestinal: Negative for vomiting, abdominal pain and diarrhea.  Genitourinary: Negative for difficulty urinating.  Musculoskeletal: Positive for arthralgias.  Skin: Negative for rash.  Neurological: Positive for headaches. Negative for tremors and syncope.   Hematological: Does not bruise/bleed easily.       Objective:   Physical Exam   amb wf strong nasal tone/ nad  Wt Readings from Last 3 Encounters:  01/11/14 174 lb (78.926 kg)  09/22/13 167 lb (75.751 kg)  08/23/13 167 lb (75.751 kg)     HEENT: nl dentition,  and orophanx. Nl external ear canals without cough reflex Severe turbinate crusting, some erthema/swelling/ no polyps   NECK :  without JVD/Nodes/TM/ nl carotid upstrokes bilaterally   LUNGS: no acc muscle use, clear to A and P bilaterally without cough on insp or exp maneuvers   CV:  RRR  no s3 or murmur or increase in P2, no edema   ABD:  soft and nontender with nl excursion in the supine position. No bruits or organomegaly, bowel sounds nl  MS:  warm without deformities, calf tenderness, cyanosis or clubbing  SKIN: warm and dry without lesions    NEURO:  alert, approp, no deficits  CXR  01/11/2014 : Subtle hazy interstitial prominence of the lung bases which may be within normal although cannot exclude an acute infectious process. My review: wnl, esp on lateral, nothing in bases   01/11/2014 labs    eos 7.8% -  bnp bmet cbc  all nl otherwise      Assessment & Plan:

## 2014-01-11 NOTE — Patient Instructions (Signed)
Augmentin 875 mg take one pill twice daily  X 10 days - take at breakfast and supper with large glass of water.  It would help reduce the usual side effects (diarrhea and yeast infections) if you ate cultured yogurt at lunch.   Please see patient coordinator before you leave today  to schedule sinus CT in 2 weeks, not sooner  Please remember to go to the lab and x-ray department downstairs for your tests - we will call you with the results when they are available and arrange appropriate follow up for your problems then

## 2014-01-11 NOTE — Progress Notes (Signed)
Quick Note:  Spoke with pt and notified of results per Dr. Wert. Pt verbalized understanding and denied any questions.  ______ 

## 2014-01-11 NOTE — Assessment & Plan Note (Addendum)
-   01/11/2014  Walked RA x 2 laps @ 185 ft each stopped due to sob/ no desat   Symptoms are markedly disproportionate to objective findings and not clear this is a lung problem but pt does appear to have difficult airway management issues. DDX of  difficult airways managment all start with A and  include Adherence, Ace Inhibitors, Acid Reflux, Active Sinus Disease, Alpha 1 Antitripsin deficiency, Anxiety masquerading as Airways dz,  ABPA,  allergy(esp in young), Aspiration (esp in elderly), Adverse effects of DPI,  Active smokers, plus two Bs  = Bronchiectasis and Beta blocker use..and one C= CHF  ? Active / acute sinus dz highest on the list  ? Allergy/ asthma > check allergy profile but hold off on any empirical rx until address the sinus issue  ? chf > excluded by nl bnp   ? Bronchiectasis > nothing to support on exam or cxr

## 2014-01-11 NOTE — Assessment & Plan Note (Addendum)
The most common causes of chronic cough in immunocompetent adults include the following: upper airway cough syndrome (UACS), previously referred to as postnasal drip syndrome (PNDS), which is caused by variety of rhinosinus conditions; (2) asthma; (3) GERD; (4) chronic bronchitis from cigarette smoking or other inhaled environmental irritants; (5) nonasthmatic eosinophilic bronchitis; and (6) bronchiectasis.   These conditions, singly or in combination, have accounted for up to 94% of the causes of chronic cough in prospective studies.   Other conditions have constituted no >6% of the causes in prospective studies These have included bronchogenic carcinoma, chronic interstitial pneumonia, sarcoidosis, left ventricular failure, ACEI-induced cough, and aspiration from a condition associated with pharyngeal dysfunction.    Chronic cough is often simultaneously caused by more than one condition. A single cause has been found from 38 to 82% of the time, multiple causes from 18 to 62%. Multiply caused cough has been the result of three diseases up to 42% of the time.      Very strongly favor  Classic Upper airway cough syndrome, so named because it's frequently impossible to sort out how much is  CR/sinusitis with freq throat clearing (which can be related to primary GERD)   vs  causing  secondary (" extra esophageal")  GERD from wide swings in gastric pressure that occur with throat clearing, often  promoting self use of mint and menthol lozenges that reduce the lower esophageal sphincter tone and exacerbate the problem further in a cyclical fashion.   These are the same pts (now being labeled as having "irritable larynx syndrome" by some cough centers) who not infrequently have a history of having failed to tolerate ace inhibitors,  dry powder inhalers or biphosphonates or report having atypical reflux symptoms that don't respond to standard doses of PPI , and are easily confused as having aecopd or asthma  flares by even experienced allergists/ pulmonologists.   For now focus should be on sinuses:  Try 10 d augmentin then sinus ct Allergy profile  Also done

## 2014-01-12 ENCOUNTER — Encounter: Payer: Self-pay | Admitting: Internal Medicine

## 2014-01-12 LAB — ALLERGY PROFILE REGION II-DC, DE, MD, ~~LOC~~, VA
Allergen, D pternoyssinus,d7: <0.1 kU/L
Alternaria Alternata: <0.1 kU/L
Aspergillus fumigatus, m3: <0.1 kU/L
Bermuda Grass: <0.1 kU/L
Box Elder IgE: <0.1 kU/L
Cat Dander: <0.1 kU/L
Cladosporium Herbarum: <0.1 kU/L
Cockroach: <0.1 kU/L
Common Ragweed: <0.1 kU/L
D. farinae: <0.1 kU/L
Dog Dander: <0.1 kU/L
Elm IgE: <0.1 kU/L
IgE (Immunoglobulin E), Serum: 10.6 IU/mL (ref 0.0–180.0)
Johnson Grass: <0.1 kU/L
Lamb's Quarters: <0.1 kU/L
Meadow Grass: <0.1 kU/L
Oak: <0.1 kU/L
Pecan/Hickory Tree IgE: <0.1 kU/L

## 2014-01-17 ENCOUNTER — Other Ambulatory Visit: Payer: Self-pay | Admitting: Internal Medicine

## 2014-01-25 ENCOUNTER — Encounter: Payer: Self-pay | Admitting: Internal Medicine

## 2014-01-25 ENCOUNTER — Ambulatory Visit (INDEPENDENT_AMBULATORY_CARE_PROVIDER_SITE_OTHER)
Admission: RE | Admit: 2014-01-25 | Discharge: 2014-01-25 | Disposition: A | Discharge status: Home / Self Care | Payer: Medicare Other | Source: Ambulatory Visit | Attending: Internal Medicine | Admitting: Internal Medicine

## 2014-01-25 DIAGNOSIS — R053 Chronic cough: Secondary | ICD-10-CM

## 2014-01-25 DIAGNOSIS — R05 Cough: Secondary | ICD-10-CM

## 2014-01-25 DIAGNOSIS — R059 Cough, unspecified: Secondary | ICD-10-CM

## 2014-02-01 ENCOUNTER — Encounter: Payer: Self-pay | Admitting: Nurse Practitioner

## 2014-02-01 ENCOUNTER — Ambulatory Visit (INDEPENDENT_AMBULATORY_CARE_PROVIDER_SITE_OTHER): Payer: Medicare Other | Admitting: Nurse Practitioner

## 2014-02-01 VITALS — BP 139/81 | HR 73 | Temp 97.4°F | Ht 63.0 in | Wt 172.0 lb

## 2014-02-01 DIAGNOSIS — J329 Chronic sinusitis, unspecified: Secondary | ICD-10-CM

## 2014-02-01 NOTE — Progress Notes (Signed)
Pre-visit discussion using our clinic review tool. No additional management support is needed unless otherwise documented below in the visit note.  

## 2014-02-01 NOTE — Patient Instructions (Signed)
Start daily sinus rinses (Neilmed Sinus rinse) to clear dried mucous and add moisture to sinuses. Use humidifier in home. Use saline spray twice daily-Simply Saline. Start probiotic daily Align or Culterelle. Please follow up in 1 week.   Sinusitis Sinusitis is redness, soreness, and swelling (inflammation) of the paranasal sinuses. Paranasal sinuses are air pockets within the bones of your face (beneath the eyes, the middle of the forehead, or above the eyes). In healthy paranasal sinuses, mucus is able to drain out, and air is able to circulate through them by way of your nose. However, when your paranasal sinuses are inflamed, mucus and air can become trapped. This can allow bacteria and other germs to grow and cause infection. Sinusitis can develop quickly and last only a short time (acute) or continue over a long period (chronic). Sinusitis that lasts for more than 12 weeks is considered chronic.  CAUSES  Causes of sinusitis include:  Allergies.  Structural abnormalities, such as displacement of the cartilage that separates your nostrils (deviated septum), which can decrease the air flow through your nose and sinuses and affect sinus drainage.  Functional abnormalities, such as when the small hairs (cilia) that line your sinuses and help remove mucus do not work properly or are not present. SYMPTOMS  Symptoms of acute and chronic sinusitis are the same. The primary symptoms are pain and pressure around the affected sinuses. Other symptoms include:  Upper toothache.  Earache.  Headache.  Bad breath.  Decreased sense of smell and taste.  A cough, which worsens when you are lying flat.  Fatigue.  Fever.  Thick drainage from your nose, which often is green and may contain pus (purulent).  Swelling and warmth over the affected sinuses. DIAGNOSIS  Your caregiver will perform a physical exam. During the exam, your caregiver may:  Look in your nose for signs of abnormal growths in  your nostrils (nasal polyps).  Tap over the affected sinus to check for signs of infection.  View the inside of your sinuses (endoscopy) with a special imaging device with a light attached (endoscope), which is inserted into your sinuses. If your caregiver suspects that you have chronic sinusitis, one or more of the following tests may be recommended:  Allergy tests.  Nasal culture A sample of mucus is taken from your nose and sent to a lab and screened for bacteria.  Nasal cytology A sample of mucus is taken from your nose and examined by your caregiver to determine if your sinusitis is related to an allergy. TREATMENT  Most cases of acute sinusitis are related to a viral infection and will resolve on their own within 10 days. Sometimes medicines are prescribed to help relieve symptoms (pain medicine, decongestants, nasal steroid sprays, or saline sprays).  However, for sinusitis related to a bacterial infection, your caregiver will prescribe antibiotic medicines. These are medicines that will help kill the bacteria causing the infection.  Rarely, sinusitis is caused by a fungal infection. In theses cases, your caregiver will prescribe antifungal medicine. For some cases of chronic sinusitis, surgery is needed. Generally, these are cases in which sinusitis recurs more than 3 times per year, despite other treatments. HOME CARE INSTRUCTIONS   Drink plenty of water. Water helps thin the mucus so your sinuses can drain more easily.  Use a humidifier.  Inhale steam 3 to 4 times a day (for example, sit in the bathroom with the shower running).  Apply a warm, moist washcloth to your face 3 to 4 times  a day, or as directed by your caregiver.  Use saline nasal sprays to help moisten and clean your sinuses.  Take over-the-counter or prescription medicines for pain, discomfort, or fever only as directed by your caregiver. SEEK IMMEDIATE MEDICAL CARE IF:  You have increasing pain or severe  headaches.  You have nausea, vomiting, or drowsiness.  You have swelling around your face.  You have vision problems.  You have a stiff neck.  You have difficulty breathing. MAKE SURE YOU:   Understand these instructions.  Will watch your condition.  Will get help right away if you are not doing well or get worse. Document Released: 12/14/2005 Document Revised: 03/07/2012 Document Reviewed: 12/29/2011 [employer] Patient Information 2014 [place], [place].

## 2014-02-01 NOTE — Progress Notes (Signed)
   Subjective:    Patient ID: April Brewer, female    DOB: Aug 15, 1946, 68 y.o.   MRN: [id number]  Sinusitis This is a chronic problem. The current episode started more than 1 month ago (2 mos ago). The problem has been waxing and waning since onset. There has been no fever. The pain is moderate. Associated symptoms include coughing, headaches and sinus pressure. Pertinent negatives include no chills, ear pain, hoarse voice, shortness of breath, sneezing, sore throat or swollen glands. Congestion: blowing thick "mucous plugs" from nose. Treatments tried: augmentin 10 days, just finished 4 da. CT sinuses nml. The treatment provided no relief.      Review of Systems  Constitutional: Negative for fever, chills and fatigue.  HENT: Positive for sinus pressure. Negative for ear pain, hoarse voice, sneezing, sore throat and voice change. Congestion: blowing thick "mucous plugs" from nose.   Respiratory: Positive for cough. Negative for shortness of breath and wheezing.   Musculoskeletal: Negative for back pain.  Neurological: Positive for headaches.  Hematological: Negative for adenopathy.       Objective:   Physical Exam  Vitals reviewed. Constitutional: She is oriented to person, place, and time. She appears well-developed and well-nourished. No distress.  HENT:  Head: Normocephalic and atraumatic.  Right Ear: External ear normal.  Left Ear: External ear normal.  Mouth/Throat: No oropharyngeal exudate.  bilat TM effusions, nasal mucosa colorless & dry  Eyes: Conjunctivae are normal. Right eye exhibits no discharge. Left eye exhibits no discharge.  Neck: Normal range of motion. Neck supple. No thyromegaly present.  Cardiovascular: Normal rate, regular rhythm and normal heart sounds.   No murmur heard. Pulmonary/Chest: Effort normal and breath sounds normal. No respiratory distress. She has no wheezes.  Lymphadenopathy:    She has no cervical adenopathy.  Neurological: She is alert and  oriented to person, place, and time.  Skin: Skin is warm and dry.  Psychiatric: She has a normal mood and affect. Her behavior is normal. Thought content normal.          Assessment & Plan:  1. Sinusitis 2 mo duration. Just finished 10 days augmentin. Still c/o "clumps" of nasal mucous. Pale mucosa. CT sinuses nml, except for a "scratch" on R side allergenic & infectious component?  Rinse sinuses to clear blood clots & thick mucous, add moisture to sinuses. Saline spray twice daily. Start probiotic. If no improvement, refer to ENT.  F/u 1 week.

## 2014-02-08 ENCOUNTER — Ambulatory Visit (INDEPENDENT_AMBULATORY_CARE_PROVIDER_SITE_OTHER): Payer: Medicare Other | Admitting: Nurse Practitioner

## 2014-02-08 ENCOUNTER — Encounter: Payer: Self-pay | Admitting: Nurse Practitioner

## 2014-02-08 VITALS — BP 150/90 | HR 67 | Temp 97.8°F | Ht 63.0 in | Wt 174.0 lb

## 2014-02-08 DIAGNOSIS — J329 Chronic sinusitis, unspecified: Secondary | ICD-10-CM

## 2014-02-08 NOTE — Patient Instructions (Signed)
You look better! Sinuses look healthier. Continue sinus rinse once or twice daily. See Dr Anitra Lauth next month. If fatigue does not improve or gets worse before next appointment, call us. Nice to see you!

## 2014-02-08 NOTE — Progress Notes (Signed)
Pre-visit discussion using our clinic review tool. No additional management support is needed unless otherwise documented below in the visit note.  

## 2014-02-08 NOTE — Progress Notes (Signed)
   Subjective:    Patient ID: April Brewer, female    DOB: [birth year], 68 y.o.   MRN: [id number]  HPI Comments: Pt presenting for follow up of nasal congestion.  URI  This is a chronic problem. The current episode started more than 1 month ago. The problem has been gradually improving. There has been no fever. Associated symptoms include congestion, headaches and sinus pain. Pertinent negatives include no abdominal pain, chest pain, coughing, nausea, plugged ear sensation, sneezing, sore throat, swollen glands or wheezing. Associated symptoms comments: Fatigue . Treatments tried: improvement since using sinus rinses-Pt states getting copious purulent nasal drainage, gradually clearing. The treatment provided significant relief.      Review of Systems  Constitutional: Positive for fatigue. Negative for fever, chills, activity change and appetite change.  HENT: Positive for congestion. Negative for sneezing and sore throat.   Respiratory: Negative for cough, chest tightness and wheezing.   Cardiovascular: Negative for chest pain.  Gastrointestinal: Negative for nausea and abdominal pain.  Musculoskeletal: Negative for back pain.  Neurological: Positive for headaches.       Objective:   Physical Exam  Vitals reviewed. Constitutional: She is oriented to person, place, and time. She appears well-developed and well-nourished. No distress.  HENT:  Head: Normocephalic and atraumatic.  Left Ear: External ear normal.  Mouth/Throat: Oropharynx is clear and moist. No oropharyngeal exudate.  Clear effusion R TM, bones visible, no bulge. Nasal mucosa moist-looks much better than last week.  Eyes: Conjunctivae are normal. Right eye exhibits no discharge. Left eye exhibits no discharge.  Neck: Normal range of motion. Neck supple. No thyromegaly present.  Cardiovascular: Normal rate, regular rhythm and normal heart sounds.   No murmur heard. Pulmonary/Chest: Effort normal and breath sounds  normal. No respiratory distress. She has no wheezes. She has no rales.  Lymphadenopathy:    She has no cervical adenopathy.  Neurological: She is alert and oriented to person, place, and time.  Skin: Skin is warm and dry.  Psychiatric: She has a normal mood and affect. Her behavior is normal. Thought content normal.          Assessment & Plan:   1. Sinusitis, chronic Pt finished 10 days augmentin. Less drainage since using sinus rinses daily. C/o fatigue. Continue rinses. Call if fatigue persistent or worse before next scheduled appt. Recent CBC nml.

## 2014-03-22 ENCOUNTER — Encounter: Payer: Self-pay | Admitting: Family Medicine

## 2014-03-22 ENCOUNTER — Ambulatory Visit (INDEPENDENT_AMBULATORY_CARE_PROVIDER_SITE_OTHER): Payer: Medicare Other | Admitting: Family Medicine

## 2014-03-22 VITALS — BP 149/92 | HR 85 | Temp 98.2°F | Resp 18 | Ht 63.0 in | Wt 172.0 lb

## 2014-03-22 DIAGNOSIS — E039 Hypothyroidism, unspecified: Secondary | ICD-10-CM

## 2014-03-22 DIAGNOSIS — R040 Epistaxis: Secondary | ICD-10-CM

## 2014-03-22 DIAGNOSIS — J310 Chronic rhinitis: Secondary | ICD-10-CM

## 2014-03-22 DIAGNOSIS — J3489 Other specified disorders of nose and nasal sinuses: Secondary | ICD-10-CM

## 2014-03-22 LAB — APTT: aPTT: 29.3 s — ABNORMAL HIGH (ref 21.7–28.8)

## 2014-03-22 LAB — CBC WITH DIFFERENTIAL/PLATELET
Basophils Absolute: 0 10*3/uL (ref 0.0–0.1)
Basophils Relative: 0.8 % (ref 0.0–3.0)
EOS ABS: 0.6 10*3/uL (ref 0.0–0.7)
Eosinophils Relative: 9.8 % — ABNORMAL HIGH (ref 0.0–5.0)
HEMATOCRIT: 34.7 % — AB (ref 36.0–46.0)
Hemoglobin: 11.6 g/dL — ABNORMAL LOW (ref 12.0–15.0)
LYMPHS ABS: 1.8 10*3/uL (ref 0.7–4.0)
Lymphocytes Relative: 29.2 % (ref 12.0–46.0)
MCHC: 33.5 g/dL (ref 30.0–36.0)
MCV: 93.7 fl (ref 78.0–100.0)
MONO ABS: 0.8 10*3/uL (ref 0.1–1.0)
Monocytes Relative: 12.7 % — ABNORMAL HIGH (ref 3.0–12.0)
NEUTROS PCT: 47.5 % (ref 43.0–77.0)
Neutro Abs: 3 10*3/uL (ref 1.4–7.7)
PLATELETS: 285 10*3/uL (ref 150.0–400.0)
RBC: 3.7 Mil/uL — ABNORMAL LOW (ref 3.87–5.11)
RDW: 14.3 % (ref 11.5–14.6)
WBC: 6.2 10*3/uL (ref 4.5–10.5)

## 2014-03-22 LAB — TSH: TSH: 1.43 u[IU]/mL (ref 0.35–5.50)

## 2014-03-22 LAB — PROTIME-INR
INR: 1.1 ratio — ABNORMAL HIGH (ref 0.8–1.0)
Prothrombin Time: 11.6 s (ref 10.2–12.4)

## 2014-03-22 MED ORDER — ALPRAZOLAM 2 MG PO TABS: ORAL_TABLET | ORAL | Status: DC

## 2014-03-22 NOTE — Progress Notes (Signed)
Pre visit review using our clinic review tool, if applicable. No additional management support is needed unless otherwise documented below in the visit note. 

## 2014-03-22 NOTE — Progress Notes (Signed)
OFFICE NOTE  03/22/2014  CC:  Chief Complaint  Patient presents with  . Follow-up  . Cough    coughing up blood     HPI: Patient is a 68 y.o. Caucasian female who is here for 6 mo f/u hypothyroidism. Compliant with med, due for TSH recheck today.  Anxiety stable with chronic use of xanax and sertraline.  C/o many months of coughing up mucous that has dark chunks of blood mixed in it.  Feels sensation of needing to get mucous out of throat so she coughs, +long hx of nasal congestion that has been treated repeatedly in the past with antibiotics without much change.   Has a nose bleed every few days.  Allergy testing (blood) neg at pulm 12/2013.  CXR at pulm unremarkable.  CT sinuses normal.  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 She has no wheezing or SOB, no problems with pneumonia in the past. No blood is seen in urine, vagina, or stool.  No excessive bruising.  Hx of nosebleed that had to be stopped in the ED about 1 yr ago.  ROS: appetite is good.  No wt loss, no unexplained/night time fevers.  Pertinent PMH:  Past medical, surgical, social, and family history reviewed. Changes noted.  MEDS: Not taking augmentin listed below Outpatient Prescriptions Prior to Visit  Medication Sig Dispense Refill  . alprazolam (XANAX) 2 MG tablet Take one-half to one tablet by mouth twice daily as needed  180 tablet  0  . cetirizine (ZYRTEC) 10 MG tablet Take 1 tablet (10 mg total) by mouth daily.  30 tablet  5  . ibuprofen (ADVIL,MOTRIN) 600 MG tablet Post surgical      . levothyroxine (SYNTHROID, LEVOTHROID) 75 MCG tablet Take 1 tablet (75 mcg total) by mouth daily with breakfast.  90 tablet  6  . oxyCODONE (OXY IR/ROXICODONE) 5 MG immediate release tablet as needed. Post surgical      . sertraline (ZOLOFT) 100 MG tablet TAKE 1 AND 1 1/2 TABLETS BY MOUTH NIGHTLY AT BEDTIME  45 tablet  6  . Triamcinolone Acetonide (NASACORT AQ NA) Place 2 sprays into the nose daily.       Marland Kitchen amoxicillin-clavulanate (AUGMENTIN) 875-125 MG per tablet        No facility-administered medications prior to visit.    PE: Blood pressure 149/92, pulse 85, temperature 98.2 F (36.8 C), temperature source Temporal, resp. rate 18, height 5\' 3"  (1.6 m), weight 172 lb (78.019 kg), SpO2 97.00%. Gen: Alert, well appearing.  Patient is oriented to person, place, time, and situation. ENT: Ears: EACs clear, normal epithelium.  TMs with good light reflex and landmarks bilaterally.  Eyes: no injection, icteris, swelling, or exudate.  EOMI, PERRLA. Nose: scattered crusts, some yellow and some blackish (c/w old blood) with some injected mucosa but minimal edema.  Large septal perforation: oval, diameter about 7-1mm.  No active bleeding. Mouth: lips without lesion/swelling.  Oral mucosa pink and moist.  Dentition intact and without obvious caries or gingival swelling.  Oropharynx without erythema, exudate, or swelling. No PND noted, no blood noted. Neck - No masses or thyromegaly or limitation in range of motion CV: RRR, no m/r/g.   LUNGS: CTA bilat, nonlabored resps, good aeration in all lung fields. ABD: soft, NT/ND   IMPRESSION AND PLAN:  1) Chronic rhinitis, with frequent epistaxis and a relatively new septal perforation. Stop all nasal sprays except saline nasal spray. Check CBC, PT/INR, PTT. Refer to ENT for further evaluation and  management.  2) Hemoptysis: this is old blood from her nose bleeds that she is swallowing and then coughing up.  3) Hypothyroidism: routine TSH check today.  An After Visit Summary was printed and given to the patient.  FOLLOW UP: 25mo

## 2014-03-27 ENCOUNTER — Other Ambulatory Visit: Payer: Self-pay | Admitting: Internal Medicine

## 2014-04-03 ENCOUNTER — Other Ambulatory Visit: Payer: Self-pay | Admitting: Family Medicine

## 2014-04-03 ENCOUNTER — Encounter: Payer: Self-pay | Admitting: Family Medicine

## 2014-04-03 MED ORDER — LEVOTHYROXINE SODIUM 75 MCG PO TABS: 75.0000 ug | ORAL_TABLET | Freq: Every day | ORAL | Status: DC

## 2014-04-07 ENCOUNTER — Other Ambulatory Visit: Payer: Self-pay | Admitting: Internal Medicine

## 2014-06-14 ENCOUNTER — Encounter: Payer: Self-pay | Admitting: Family Medicine

## 2014-06-14 ENCOUNTER — Other Ambulatory Visit: Payer: Self-pay | Admitting: Family Medicine

## 2014-06-27 ENCOUNTER — Telehealth: Payer: Self-pay | Admitting: Family Medicine

## 2014-06-27 NOTE — Telephone Encounter (Signed)
Patient called requesting an excuse to get out of jury duty due to her pain.  Please advise.

## 2014-06-27 NOTE — Telephone Encounter (Signed)
No.  Sorry. She can still perform jury duty.

## 2014-06-28 NOTE — Telephone Encounter (Signed)
Pt aware.

## 2014-07-12 ENCOUNTER — Telehealth: Payer: Self-pay | Admitting: Family Medicine

## 2014-07-12 NOTE — Telephone Encounter (Signed)
Please advise 

## 2014-07-12 NOTE — Telephone Encounter (Signed)
Patient is requesting a bone density test at [facility].

## 2014-07-13 NOTE — Telephone Encounter (Signed)
Pt aware.

## 2014-07-13 NOTE — Telephone Encounter (Signed)
We'll talk about this test at o/v coming up in September.-thx

## 2014-07-30 ENCOUNTER — Encounter: Payer: Self-pay | Admitting: Family Medicine

## 2014-08-07 ENCOUNTER — Telehealth: Payer: Self-pay | Admitting: Family Medicine

## 2014-08-07 NOTE — Telephone Encounter (Signed)
Raquel called from [facility], patient is scheduled for Bone density 08/16/14 and they need an order faxed to [phone]

## 2014-08-07 NOTE — Telephone Encounter (Signed)
Pt needs to cancel this unless she comes into see Dr. Anitra Lauth first.  I told patient this last time she called for referral.

## 2014-08-09 NOTE — Telephone Encounter (Signed)
Looks like patient is not due to be seen until the later part of Sept per Dr. Idelle Brewer last [place]. Do I need to call her to schedule an appt to get the order for the bone density scan?

## 2014-08-10 NOTE — Telephone Encounter (Signed)
There's no rush on scan.  She can wait until her September appointment.

## 2014-08-10 NOTE — Telephone Encounter (Signed)
Patient is going to call & reschedule her appointment after her 09/20/14 appt with Dr. Anitra Lauth

## 2014-08-23 ENCOUNTER — Telehealth: Payer: Self-pay | Admitting: Family Medicine

## 2014-08-23 ENCOUNTER — Encounter: Payer: Self-pay | Admitting: Family Medicine

## 2014-08-23 NOTE — Telephone Encounter (Signed)
Pls notify pt that her bone density test was normal. She should have a repeat bone density test in 2 yrs. She should continue taking 1500 mg of calcium per day and 800 units of Vit D per day.-thx

## 2014-08-24 NOTE — Telephone Encounter (Signed)
Patient aware.  She will begin calcium and vit D.

## 2014-09-14 ENCOUNTER — Encounter: Payer: Self-pay | Admitting: Family Medicine

## 2014-09-20 ENCOUNTER — Encounter: Payer: Self-pay | Admitting: Family Medicine

## 2014-09-20 ENCOUNTER — Ambulatory Visit (INDEPENDENT_AMBULATORY_CARE_PROVIDER_SITE_OTHER): Payer: Medicare Other | Admitting: Family Medicine

## 2014-09-20 VITALS — BP 126/83 | HR 70 | Temp 97.9°F | Wt 173.1 lb

## 2014-09-20 DIAGNOSIS — F32A Depression, unspecified: Secondary | ICD-10-CM

## 2014-09-20 DIAGNOSIS — G4700 Insomnia, unspecified: Secondary | ICD-10-CM

## 2014-09-20 DIAGNOSIS — E039 Hypothyroidism, unspecified: Secondary | ICD-10-CM

## 2014-09-20 DIAGNOSIS — F341 Dysthymic disorder: Secondary | ICD-10-CM

## 2014-09-20 DIAGNOSIS — Z23 Encounter for immunization: Secondary | ICD-10-CM

## 2014-09-20 DIAGNOSIS — R350 Frequency of micturition: Secondary | ICD-10-CM

## 2014-09-20 DIAGNOSIS — F329 Major depressive disorder, single episode, unspecified: Secondary | ICD-10-CM

## 2014-09-20 DIAGNOSIS — F419 Anxiety disorder, unspecified: Secondary | ICD-10-CM

## 2014-09-20 DIAGNOSIS — R339 Retention of urine, unspecified: Secondary | ICD-10-CM

## 2014-09-20 LAB — POCT URINALYSIS DIPSTICK
Bilirubin, UA: NEGATIVE
Blood, UA: NEGATIVE
GLUCOSE UA: NEGATIVE
Ketones, UA: NEGATIVE
NITRITE UA: NEGATIVE
Protein, UA: NEGATIVE
SPEC GRAV UA: 1.01
Urobilinogen, UA: 0.2
pH, UA: 6.5

## 2014-09-20 LAB — BASIC METABOLIC PANEL
BUN: 15 mg/dL (ref 6–23)
CO2: 26 mEq/L (ref 19–32)
Calcium: 9.4 mg/dL (ref 8.4–10.5)
Chloride: 97 mEq/L (ref 96–112)
Creatinine, Ser: 0.8 mg/dL (ref 0.4–1.2)
GFR: 80.12 mL/min (ref >60.00)
Glucose, Bld: 88 mg/dL (ref 70–99)
POTASSIUM: 5.1 meq/L (ref 3.5–5.1)
Sodium: 131 mEq/L — ABNORMAL LOW (ref 135–145)

## 2014-09-20 LAB — TSH: TSH: 1.98 u[IU]/mL (ref 0.35–4.50)

## 2014-09-20 MED ORDER — ALPRAZOLAM 2 MG PO TABS: ORAL_TABLET | ORAL | Status: DC

## 2014-09-20 MED ORDER — ZOLPIDEM TARTRATE 5 MG PO TABS: 5.0000 mg | ORAL_TABLET | Freq: Every evening | ORAL | Status: DC | PRN

## 2014-09-20 NOTE — Progress Notes (Signed)
Pre visit review using our clinic review tool, if applicable. No additional management support is needed unless otherwise documented below in the visit note. 

## 2014-09-20 NOTE — Progress Notes (Signed)
OFFICE NOTE  09/20/2014  CC: routine f/u + "I can't sleep"  HPI: Patient is a 68 y.o. Caucasian female who is here for 6 mo f/u hypothyroidism, anxiety/depression, arthritis. "I don't sleep good".  At least the last 6 mo.  No new meds. Has trouble initiating sleep and maintaining sleep.  Lays there and lots of areas of her body hurt.  Takes 1 oxycodone 10mg  tab qhs (Dr. Nelva Bush).  However, says her mind won't shut off. Goes to the bathroom day and night and feels like she can't get urine to come or empty.  No urinary frequency.  Her husband gave her some of his lasix a few nights in a row "to get the urine to flow better". OTC sleep aid/meds have not helped.  Takes a 2 mg xanax hs.  She takes levothyroxine with food and vit D.  Pertinent PMH:  Past medical, surgical, social, and family history reviewed and no changes are noted since last office visit.  MEDS:  Outpatient Prescriptions Prior to Visit  Medication Sig Dispense Refill  . alprazolam (XANAX) 2 MG tablet Take one-half to one tablet by mouth twice daily as needed  180 tablet  1  . cetirizine (ZYRTEC) 10 MG tablet Take 1 tablet (10 mg total) by mouth daily.  30 tablet  5  . ibuprofen (ADVIL,MOTRIN) 600 MG tablet Post surgical      . levothyroxine (SYNTHROID, LEVOTHROID) 75 MCG tablet Take 1 tablet (75 mcg total) by mouth daily with breakfast.  90 tablet  1  . oxyCODONE (OXY IR/ROXICODONE) 5 MG immediate release tablet as needed. Post surgical      . sertraline (ZOLOFT) 100 MG tablet TAKE ONE AND ONE-HALF TABLETS BY MOUTH NIGHTLY AT BEDTIME   45 tablet  5  . Triamcinolone Acetonide (NASACORT AQ NA) Place 2 sprays into the nose daily.       No facility-administered medications prior to visit.    PE: Blood pressure 126/83, pulse 70, temperature 97.9 F (36.6 C), temperature source Oral, weight 173 lb 1.9 oz (78.527 kg), SpO2 97.00%. Gen: Alert, well appearing.  Patient is oriented to person, place, time, and situation. GNF:AOZH:  no injection, icteris, swelling, or exudate.  EOMI, PERRLA. Mouth: lips without lesion/swelling.  Oral mucosa pink and moist. Oropharynx without erythema, exudate, or swelling.  Neck - No masses or thyromegaly or limitation in range of motion CV: RRR, no m/r/g.   LUNGS: CTA bilat, nonlabored resps, good aeration in all lung fields. ABD: soft, NT/ND  LAB: CC UA today showed trace LEU, otherwise normal  IMPRESSION AND PLAN:  1) Urinary frequency? +incomplete emptying of bladder: UA minimally abnormal today.  Send for c/s, no med rx'd yet. May need to return for pelvic exam to r/o uterine/bladder prolapse.  2) Hypothyroidism: recheck TSH today.  3) Anxiety and depression: stable.  4) Insomnia: trial of ambien 5mg  qhs  An After Visit Summary was printed and given to the patient.  FOLLOW UP: 35mo

## 2014-09-23 LAB — URINE CULTURE: Colony Count: 50000

## 2014-09-24 MED ORDER — CIPROFLOXACIN HCL 500 MG PO TABS: 500.0000 mg | ORAL_TABLET | Freq: Two times a day (BID) | ORAL | Status: DC

## 2014-09-24 NOTE — Addendum Note (Signed)
Addended by: Jannette Spanner on: 09/24/2014 10:25 AM   Modules accepted: Orders

## 2014-09-27 ENCOUNTER — Other Ambulatory Visit: Payer: Self-pay | Admitting: Family Medicine

## 2014-09-27 NOTE — Telephone Encounter (Signed)
Patient requesting rf of cipro.  Please advise.

## 2014-09-27 NOTE — Telephone Encounter (Signed)
I'm sorry, but she never had very convincing UTI symptoms and her urine only grew a mild amount of bacteria, so I would like to know specifically WHY she is requesting a RF of the antibiotic (what symptoms is she having--make her be specific and be careful not to put any words in her mouth).  Thx-PM

## 2014-09-28 ENCOUNTER — Other Ambulatory Visit: Payer: Self-pay | Admitting: Family Medicine

## 2014-12-06 ENCOUNTER — Other Ambulatory Visit: Payer: Self-pay | Admitting: Family Medicine

## 2014-12-13 ENCOUNTER — Ambulatory Visit (INDEPENDENT_AMBULATORY_CARE_PROVIDER_SITE_OTHER): Payer: Medicare Other | Admitting: Family Medicine

## 2014-12-13 ENCOUNTER — Encounter: Payer: Self-pay | Admitting: Family Medicine

## 2014-12-13 VITALS — BP 139/88 | HR 75 | Temp 97.7°F | Resp 18 | Ht 63.0 in | Wt 163.0 lb

## 2014-12-13 DIAGNOSIS — K529 Noninfective gastroenteritis and colitis, unspecified: Secondary | ICD-10-CM

## 2014-12-13 DIAGNOSIS — I10 Essential (primary) hypertension: Secondary | ICD-10-CM | POA: Diagnosis not present

## 2014-12-13 DIAGNOSIS — R197 Diarrhea, unspecified: Secondary | ICD-10-CM

## 2014-12-13 DIAGNOSIS — D509 Iron deficiency anemia, unspecified: Secondary | ICD-10-CM

## 2014-12-13 DIAGNOSIS — K921 Melena: Secondary | ICD-10-CM

## 2014-12-13 DIAGNOSIS — Z23 Encounter for immunization: Secondary | ICD-10-CM

## 2014-12-13 DIAGNOSIS — G4700 Insomnia, unspecified: Secondary | ICD-10-CM

## 2014-12-13 LAB — IBC PANEL
Iron: 52 ug/dL (ref 42–145)
Saturation Ratios: 16.3 % — ABNORMAL LOW (ref 20.0–50.0)
Transferrin: 227.9 mg/dL (ref 212.0–360.0)

## 2014-12-13 LAB — CBC WITH DIFFERENTIAL/PLATELET
BASOS PCT: 0.6 % (ref 0.0–3.0)
Basophils Absolute: 0 10*3/uL (ref 0.0–0.1)
EOS PCT: 6.7 % — AB (ref 0.0–5.0)
Eosinophils Absolute: 0.4 10*3/uL (ref 0.0–0.7)
HCT: 33.6 % — ABNORMAL LOW (ref 36.0–46.0)
HEMOGLOBIN: 11.1 g/dL — AB (ref 12.0–15.0)
LYMPHS PCT: 29.3 % (ref 12.0–46.0)
Lymphs Abs: 1.5 10*3/uL (ref 0.7–4.0)
MCHC: 33 g/dL (ref 30.0–36.0)
MCV: 94.4 fl (ref 78.0–100.0)
MONO ABS: 0.7 10*3/uL (ref 0.1–1.0)
Monocytes Relative: 13.9 % — ABNORMAL HIGH (ref 3.0–12.0)
NEUTROS PCT: 49.5 % (ref 43.0–77.0)
Neutro Abs: 2.6 10*3/uL (ref 1.4–7.7)
Platelets: 295 10*3/uL (ref 150.0–400.0)
RBC: 3.56 Mil/uL — AB (ref 3.87–5.11)
RDW: 13.9 % (ref 11.5–15.5)
WBC: 5.2 10*3/uL (ref 4.0–10.5)

## 2014-12-13 LAB — COMPREHENSIVE METABOLIC PANEL
ALT: 16 U/L (ref 0–35)
AST: 22 U/L (ref 0–37)
Albumin: 3.8 g/dL (ref 3.5–5.2)
Alkaline Phosphatase: 83 U/L (ref 39–117)
BUN: 9 mg/dL (ref 6–23)
CALCIUM: 9.4 mg/dL (ref 8.4–10.5)
CHLORIDE: 102 meq/L (ref 96–112)
CO2: 24 mEq/L (ref 19–32)
Creatinine, Ser: 0.7 mg/dL (ref 0.4–1.2)
GFR: 82.57 mL/min (ref >60.00)
GLUCOSE: 95 mg/dL (ref 70–99)
Potassium: 4.5 mEq/L (ref 3.5–5.1)
Sodium: 133 mEq/L — ABNORMAL LOW (ref 135–145)
Total Bilirubin: 0.5 mg/dL (ref 0.2–1.2)
Total Protein: 6.6 g/dL (ref 6.0–8.3)

## 2014-12-13 LAB — POCT HEMOGLOBIN: HEMOGLOBIN: 10.8 g/dL — AB (ref 12.2–16.2)

## 2014-12-13 LAB — FERRITIN: Ferritin: 28.5 ng/mL (ref 10.0–291.0)

## 2014-12-13 MED ORDER — TRAZODONE HCL 50 MG PO TABS: ORAL_TABLET | ORAL | Status: DC

## 2014-12-13 MED ORDER — CIPROFLOXACIN HCL 500 MG PO TABS: 500.0000 mg | ORAL_TABLET | Freq: Two times a day (BID) | ORAL | Status: DC

## 2014-12-13 MED ORDER — METRONIDAZOLE 500 MG PO TABS: 500.0000 mg | ORAL_TABLET | Freq: Three times a day (TID) | ORAL | Status: DC

## 2014-12-13 NOTE — Progress Notes (Signed)
OFFICE NOTE  12/13/2014  CC:  Chief Complaint  Patient presents with  . Diarrhea    x 3 weeks  . Abdominal Pain   HPI: Patient is a 68 y.o. Caucasian female who is here for at least 2 wks of watery diarrhea, up to 15 BM's per 24h, some lower abdominal cramping.  No n/v. She frequently has rapid postprandial diarrhea.  Appetite is down.  No known fevers.  No meds tried for her diarrhea. She is trying to stay focused on drinking lots of fluids. She says the sx's did not start after starting any new meds.   She took 2 iron tabs last night and her next stool about 2 hours later was black water.  She does not usually take iron but her husband told her to take it b/c she felt fatigued.  She takes 1 ibuprofen a day avg, 1 oxycodone a day avg, 1 skelaxin a day avg.  Insurance coverage for sed/hypnotics changed: no coverage for ambien but coverage for trazodone, rozarem, and belsomra.    Pertinent PMH:  Past medical, surgical, social, and family history reviewed and no changes are noted since last office visit.  MEDS: Not taking cipro listed below Outpatient Prescriptions Prior to Visit  Medication Sig Dispense Refill  . alprazolam (XANAX) 2 MG tablet Take one-half to one tablet by mouth twice daily as needed 180 tablet 1  . ciprofloxacin (CIPRO) 500 MG tablet Take 1 tablet (500 mg total) by mouth 2 (two) times daily. 6 tablet 0  . ibuprofen (ADVIL,MOTRIN) 600 MG tablet Post surgical    . levothyroxine (SYNTHROID, LEVOTHROID) 75 MCG tablet TAKE ONE TABLET BY MOUTH ONE TIME DAILY WITH BREAKFAST  90 tablet 0  . oxyCODONE (OXY IR/ROXICODONE) 5 MG immediate release tablet as needed. Post surgical    . sertraline (ZOLOFT) 100 MG tablet TAKE ONE AND ONE-HALF TABLET BY MOUTH NIGHTLY AT BEDTIME  45 tablet 4  . zolpidem (AMBIEN) 5 MG tablet Take 1 tablet (5 mg total) by mouth at bedtime as needed for sleep. 30 tablet 1  . cetirizine (ZYRTEC) 10 MG tablet Take 1 tablet (10 mg total) by mouth daily. 30  tablet 5   No facility-administered medications prior to visit.    PE: Blood pressure 139/88, pulse 75, temperature 97.7 F (36.5 C), temperature source Temporal, resp. rate 18, height 5\' 3"  (1.6 m), weight 163 lb (73.936 kg), SpO2 100 %. Gen: Alert, well appearing.  Patient is oriented to person, place, time, and situation. NLZ:JQBH: no injection, icteris, swelling, or exudate.  EOMI, PERRLA. Mouth: lips without lesion/swelling.  Oral mucosa pink and moist. Oropharynx without erythema, exudate, or swelling.  CV: RRR, no m/r/g.   LUNGS: CTA bilat, nonlabored resps, good aeration in all lung fields. ABD: soft, mild diffuse TTP, lower abd>upper abd, ND, BS normal.  No hepatospenomegaly or mass.  No bruits. EXT: no clubbing or cyanosis.  2+ pitting edema in both LE's SKIN: no pallor or jaundice  POCT Hb from venous sample: 10.8 today (compared to last Hb in chart which was 11.6 and MCV of 94 with normal WBC and platelets on 03/22/14)  IMPRESSION AND PLAN:  1) Diarrheal illness: Mild anemia, ? Of whether or not this anemia is related to her current illness or not. Will have her monitor her stools for black color OFF of iron. Will check CBC, CMET, and stool studies. I am going to start empiric abx for c diff colitis (cipro 500mg  bid z 10d and flagyl  500mg  tid x 10d).  2) insomnia: insurance won't cover ambien plus it didn't help anyway.  Will do trial of trazodone 50mg , 1-2 tabs qhs prn.  An After Visit Summary was printed and given to the patient.  FOLLOW UP: 2 weeks.

## 2014-12-13 NOTE — Progress Notes (Signed)
Pre visit review using our clinic review tool, if applicable. No additional management support is needed unless otherwise documented below in the visit note. 

## 2014-12-14 ENCOUNTER — Other Ambulatory Visit: Payer: Self-pay | Admitting: Family Medicine

## 2014-12-15 LAB — FECAL LACTOFERRIN, QUANT: LACTOFERRIN: POSITIVE

## 2014-12-15 LAB — CLOSTRIDIUM DIFFICILE BY PCR: CDIFFPCR: NOT DETECTED

## 2014-12-17 LAB — GIARDIA/CRYPTOSPORIDIUM (EIA)
CRYPTOSPORIDIUM SCREEN (EIA) (SOL): NEGATIVE
GIARDIA SCREEN (EIA): NEGATIVE

## 2014-12-18 LAB — STOOL CULTURE

## 2014-12-19 ENCOUNTER — Other Ambulatory Visit: Payer: Self-pay | Admitting: Family Medicine

## 2014-12-26 ENCOUNTER — Ambulatory Visit (INDEPENDENT_AMBULATORY_CARE_PROVIDER_SITE_OTHER): Payer: Medicare Other | Admitting: Family Medicine

## 2014-12-26 ENCOUNTER — Encounter: Payer: Self-pay | Admitting: Family Medicine

## 2014-12-26 VITALS — BP 150/92 | HR 76 | Temp 98.0°F | Resp 18 | Ht 63.0 in | Wt 165.0 lb

## 2014-12-26 DIAGNOSIS — R072 Precordial pain: Secondary | ICD-10-CM

## 2014-12-26 DIAGNOSIS — K529 Noninfective gastroenteritis and colitis, unspecified: Secondary | ICD-10-CM

## 2014-12-26 DIAGNOSIS — R197 Diarrhea, unspecified: Secondary | ICD-10-CM

## 2014-12-26 MED ORDER — DIPHENOXYLATE-ATROPINE 2.5-0.025 MG PO TABS: ORAL_TABLET | ORAL | Status: DC

## 2014-12-26 NOTE — Progress Notes (Signed)
OFFICE NOTE  12/26/2014  CC:  Chief Complaint  Patient presents with  . Follow-up    HPI: Patient is a 68 y.o. Caucasian female who is here for 2 wk f/u diarrhea, also for insomnia. Stool studies obtained shortly after last o/v were all neg except fecal lactoferrin positive.  I rx'd her empiric cipro and flagyl after she gave stool sample and before any results were back. Unfortunately, she is still having 5-10 greenish or dark brown, watery BMs per day, no blood or mucous noted.  Doesn't feel like anything has changed.  Has BM's daytime and nighttime.  She feels no definite worsening of BMs in relation to eating. She has been taking imodium once a day.  She took the cipro and flagyl I gave her.  No known fever.  No nausea or vomiting. Still hurting intermittently in random areas of her abdomen but sounds like these are brief episodes surrounding her BM's.  Still eating/drinking fine.   She generally reports feeling a "globally positive" review of systems.  Most specifically, she remarks about feeling random epidsodes of substernal and left sided chest pain that make her left arm feel "weird".  No associated sx's such as nausea, diaphoresis, SOB, palpitations, or dizziness.  This is not brought on by exertion or relieved by rest.  Sometimes lasts only seconds and sometimes waxes/wanes for hours.  Sometimes has it several times a day and sometimes goes days w/out it. Has never had a problem with CP or any hx of CAD.  Taking trazodone 100 mg helps her sleep well, on a good note.  Pertinent PMH:  Past medical, surgical, social, and family history reviewed and no changes are noted since last office visit.  MEDS: Only occasionally taking oxycodone hs listed below. She has finished cipro and flagyl listed below Outpatient Prescriptions Prior to Visit  Medication Sig Dispense Refill  . alprazolam (XANAX) 2 MG tablet Take one-half to one tablet by mouth twice daily as needed 180 tablet 1  .  ibuprofen (ADVIL,MOTRIN) 600 MG tablet Post surgical    . levothyroxine (SYNTHROID, LEVOTHROID) 75 MCG tablet TAKE ONE TABLET BY MOUTH ONE TIME DAILY WITH BREAKFAST  90 tablet 0  . methocarbamol (ROBAXIN) 500 MG tablet Take 500 mg by mouth 4 (four) times daily.    Marland Kitchen oxyCODONE (OXY IR/ROXICODONE) 5 MG immediate release tablet as needed. Post surgical    . sertraline (ZOLOFT) 100 MG tablet TAKE ONE AND ONE-HALF TABLET BY MOUTH NIGHTLY AT BEDTIME  45 tablet 4  . traZODone (DESYREL) 50 MG tablet 1-2 tabs po qhs prn 60 tablet 6  . ciprofloxacin (CIPRO) 500 MG tablet Take 1 tablet (500 mg total) by mouth 2 (two) times daily. (Patient not taking: Reported on 12/26/2014) 20 tablet 0  . metroNIDAZOLE (FLAGYL) 500 MG tablet Take 1 tablet (500 mg total) by mouth 3 (three) times daily. (Patient not taking: Reported on 12/26/2014) 30 tablet 0   No facility-administered medications prior to visit.    PE: Blood pressure 150/92, pulse 76, temperature 98 F (36.7 C), temperature source Temporal, resp. rate 18, height 5\' 3"  (1.6 m), weight 165 lb (74.844 kg), SpO2 99 %. Wt is up 2 lbs compared to last visit 2 wks ago. Gen: Alert, well appearing.  Patient is oriented to person, place, time, and situation. UXL:KGMW: no injection, icteris, swelling, or exudate.  EOMI, PERRLA. Mouth: lips without lesion/swelling.  Oral mucosa pink and moist. Oropharynx without erythema, exudate, or swelling.  CV: RRR, no m/r/g.  LUNGS: CTA bilat, nonlabored resps, good aeration in all lung fields. ABD: soft, ND/ND EXT: no clubbing, cyanosis, or edema.   LABS:  Lab Results  Component Value Date   WBC 5.2 12/13/2014   HGB 10.8* 12/13/2014   HCT 33.6* 12/13/2014   MCV 94.4 12/13/2014   PLT 295.0 12/13/2014     Chemistry      Component Value Date/Time   NA 133* 12/13/2014 1445   K 4.5 12/13/2014 1445   CL 102 12/13/2014 1445   CO2 24 12/13/2014 1445   BUN 9 12/13/2014 1445   CREATININE 0.7 12/13/2014 1445       Component Value Date/Time   CALCIUM 9.4 12/13/2014 1445   ALKPHOS 83 12/13/2014 1445   AST 22 12/13/2014 1445   ALT 16 12/13/2014 1445   BILITOT 0.5 12/13/2014 1445     Stool studies 12/14/14: Fecal lactoferin POS, Stool culture negative, C diff pcr negative, giardia/crypto negative  12 lead EKG today: NSR, rate 68, normal intervals, wave morphologies, and voltages: normal EKG.  IMPRESSION AND PLAN:  1) Prolonged/protracted diarrhea, unknown etiology. No significant sign of dehydration. I wanted to check pt's BMET again today but she left w/out stopping by our lab so we'll have to call her and have her return for lab visit to get this. Will refer her to GI for further evaluation. I recommended she d/c imodium and I rx'd lomotil 1-2 tabs q6h prn to try to decrease stool frequency some.  2) Insomnia: improved on trazodone 100mg  qhs.  3) Atypical chest pain: reassured pt. EKG normal today. I think her current GI illness has her feeling bad all over PLUS has her anxious. No indication for further w/u at this time.  An After Visit Summary was printed and given to the patient.  FOLLOW UP: prn

## 2014-12-26 NOTE — Progress Notes (Signed)
Pre visit review using our clinic review tool, if applicable. No additional management support is needed unless otherwise documented below in the visit note. 

## 2015-01-02 ENCOUNTER — Other Ambulatory Visit: Payer: Medicare Other

## 2015-01-02 ENCOUNTER — Encounter: Payer: Self-pay | Admitting: Physician Assistant

## 2015-01-02 ENCOUNTER — Ambulatory Visit (INDEPENDENT_AMBULATORY_CARE_PROVIDER_SITE_OTHER): Payer: Medicare Other | Admitting: Physician Assistant

## 2015-01-02 VITALS — BP 126/70 | HR 76 | Ht 63.0 in | Wt 162.4 lb

## 2015-01-02 DIAGNOSIS — R1084 Generalized abdominal pain: Secondary | ICD-10-CM | POA: Diagnosis not present

## 2015-01-02 DIAGNOSIS — R197 Diarrhea, unspecified: Secondary | ICD-10-CM | POA: Diagnosis not present

## 2015-01-02 MED ORDER — DICYCLOMINE HCL 10 MG PO CAPS: ORAL_CAPSULE | ORAL | Status: DC

## 2015-01-02 MED ORDER — SACCHAROMYCES BOULARDII 250 MG PO CAPS: 250.0000 mg | ORAL_CAPSULE | Freq: Two times a day (BID) | ORAL | Status: DC

## 2015-01-02 MED ORDER — VANCOMYCIN HCL 250 MG PO CAPS: 250.0000 mg | ORAL_CAPSULE | Freq: Four times a day (QID) | ORAL | Status: DC

## 2015-01-02 NOTE — Patient Instructions (Addendum)
Stop the Lomotil.  We will call you with the stool test results.   We sent prescriptions to [facility], [facility]. They will call you when they have the medication compounded on your cell number: [phone]. The cost of the vancomycin is $98.10.  1. Vancomycin 250 mg 2. Bentyl ( dicyclomine) 10 mg 3. Florastor 250 mg  ( Probiotic)   We made you a follow up appointment with Dr. Fuller Plan on 01-30-2015 at 10:45 am.

## 2015-01-02 NOTE — Progress Notes (Signed)
Reviewed and agree with management plan.  Karlena Luebke T. Brittanyann Wittner, MD FACG 

## 2015-01-02 NOTE — Progress Notes (Signed)
Patient ID: [name], female   DOB: 06-14-1946, 69 y.o.   MRN: [id number]   Subjective:    Patient ID: April Brewer, female    DOB: May 09, 1946, 69 y.o.   MRN: [id number]  HPI April Brewer is a pleasant 69 year old white female referred by Dr. Ernestine Brewer for evaluation of persistent diarrhea. She is known to Dr. Fuller Brewer from procedures done in 2008. She had a colonoscopy at that time showing diverticulosis ,otherwise negative exam and an EGD with a small hiatal hernia. Her husband states that she's been having  diarrhea since before Thanksgiving, and has been having periodic accidents. Has difficulty with intermittent urinary incontinence. She apparently has been having to 10 bowel movements per day watery to liquid stool which is malodorous. His mucous in the stool at times but no blood. She has rather generalized abdominal discomfort no severe pain some cramping. She's not had any chills or fever. Her appetite has been decreased and she has lost 4-5 pounds. No nausea or vomiting. She and her husband deny her taking any antibiotics prior to onset of her diarrhea, nor had she been started on any new medications. She underwent workup with labs and stool studies. CBC was unremarkable, stool for C. difficile and cultures both negative ,stool for lactoferrin was positive. She was given an empiric course of Cipro and Flagyl just prior to doing the stool cultures. She says she completed that but did not have any improvement in her symptoms. She also has a prescription for Lomotil which she says is not helping.  Review of Systems Pertinent positive and negative review of systems were noted in the above HPI section.  All other review of systems was otherwise negative.  Outpatient Encounter Prescriptions as of 01/02/2015  Medication Sig  . alprazolam (XANAX) 2 MG tablet Take one-half to one tablet by mouth twice daily as needed  . diphenoxylate-atropine (LOMOTIL) 2.5-0.025 MG per tablet 1-2 tabs po q6h prn diarrhea  .  ibuprofen (ADVIL,MOTRIN) 600 MG tablet Post surgical  . levothyroxine (SYNTHROID, LEVOTHROID) 75 MCG tablet TAKE ONE TABLET BY MOUTH ONE TIME DAILY WITH BREAKFAST   . methocarbamol (ROBAXIN) 500 MG tablet Take 500 mg by mouth 4 (four) times daily.  Marland Kitchen oxyCODONE-acetaminophen (PERCOCET) 10-325 MG per tablet Take 1 tablet by mouth every 4 (four) hours as needed for pain.  Marland Kitchen sertraline (ZOLOFT) 100 MG tablet TAKE ONE AND ONE-HALF TABLET BY MOUTH NIGHTLY AT BEDTIME   . traZODone (DESYREL) 50 MG tablet 1-2 tabs po qhs prn  . dicyclomine (BENTYL) 10 MG capsule Take 1 tab 3-4 times daily as needed for cramping/pain.  Marland Kitchen saccharomyces boulardii (FLORASTOR) 250 MG capsule Take 1 capsule (250 mg total) by mouth 2 (two) times daily.  . vancomycin (VANCOCIN) 250 MG capsule Take 1 capsule (250 mg total) by mouth 4 (four) times daily.  . [DISCONTINUED] oxyCODONE (OXY IR/ROXICODONE) 5 MG immediate release tablet as needed. Post surgical   No Known Allergies Patient Active Problem List   Diagnosis Date Noted  . Dyspnea 01/11/2014  . Venous insufficiency 08/23/2013  . Peripheral edema 08/23/2013  . Anxiety and depression 08/23/2013  . Hyponatremia 05/19/2013  . Cough, persistent 08/31/2012  . 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 OSTEOPENIA 02/18/2009  . GOITER 06/02/2007  . HYPOTHYROIDISM 06/02/2007  . DEPRESSION 06/02/2007  . HYPERTENSION 06/02/2007  . ALLERGIC RHINITIS 06/02/2007  . GERD 06/02/2007  . INSOMNIA 06/02/2007   History   Social History  . Marital Status: Married    Spouse  Name: N/A    Number of Children: N/A  . Years of Education: N/A   Occupational History  . Not on file.   Social History Main Topics  . Smoking status: Never Smoker   . Smokeless tobacco: Never Used  . Alcohol Use: No  . Drug Use: No  . Sexual Activity: Not on file   Other Topics Concern  . Not on file   Social History Narrative   Lives with her husband in [place], has two sons.   Born and raised [employer].   Retired Programmer, multimedia related to back pain problems.   No T/A/Ds.             Ms. Oyer family history includes Arthritis in her mother; Breast cancer in her mother and sister; Colon cancer in her father; Diabetes in her mother; Heart disease in her father and sister; Lung cancer in her brother; Stroke in her mother.      Objective:    Filed Vitals:   01/02/15 1008  BP: 126/70  Pulse: 76    Physical Exam  well-developed older white female in no acute distress, accompanied by her husband vitals as above height 5 foot 3 weight 162. HEENT: nontraumatic normocephalic EOMI PERRLA sclera anicteric, Supple: no JVD, Cardiovascular: regular rate and rhythm with S1-S2 no murmur or gallop, Pulmonary: clear bilaterally, Abdomen :soft, mild rather diffuse tenderness no guarding or rebound no palpable mass or hepatosplenomegaly bowel sounds are hyperactive, Rectal: not done, Extremities: no clubbing cyanosis or edema skin warm and dry, Psych :mood and affect appropriate       Assessment & Brewer:   #16  69 year old female with 5-6 week history of persistent diarrhea, previously negative stool cultures and no response to Cipro and Flagyl. Stool for lactoferrin positive. She has had associated abdominal discomfort and weight loss. I still have suspicion for an infectious etiology and wonder if she now has C. difficile., Also consider new onset of IBD or microscopic colitis/collagenous colitis. #2 GERD #3 anxiety and depression #4 hypothyroidism #5 hypertension Brewer;  stool for C. difficile by PCR Stop Lomotil At this point will go ahead and empirically start her on vancomycin 250 mg by mouth 4 times daily 14 days and floor store one by mouth twice daily 30 days. Start Bentyl 10 mg 3-4 times daily as needed for cramping and abdominal pain Patient will be scheduled for an office follow-up in 2-3 weeks and is encouraged to call in the interim if she does not feel she is  improving.   If she does not respond to above regimen she will need colonoscopy with biopsies.   April Brewer S April Gillihan PA-C 01/02/2015

## 2015-01-03 ENCOUNTER — Other Ambulatory Visit: Payer: Medicare Other

## 2015-01-03 DIAGNOSIS — R1084 Generalized abdominal pain: Secondary | ICD-10-CM

## 2015-01-03 DIAGNOSIS — R197 Diarrhea, unspecified: Secondary | ICD-10-CM

## 2015-01-04 LAB — CLOSTRIDIUM DIFFICILE BY PCR: CDIFFPCR: NOT DETECTED

## 2015-01-14 ENCOUNTER — Other Ambulatory Visit: Payer: Self-pay | Admitting: Family Medicine

## 2015-01-30 ENCOUNTER — Other Ambulatory Visit (INDEPENDENT_AMBULATORY_CARE_PROVIDER_SITE_OTHER): Payer: Medicare Other

## 2015-01-30 ENCOUNTER — Encounter: Payer: Self-pay | Admitting: Gastroenterology

## 2015-01-30 ENCOUNTER — Ambulatory Visit (INDEPENDENT_AMBULATORY_CARE_PROVIDER_SITE_OTHER): Payer: Medicare Other | Admitting: Gastroenterology

## 2015-01-30 VITALS — BP 120/76 | HR 56 | Ht 64.0 in | Wt 156.4 lb

## 2015-01-30 DIAGNOSIS — R1030 Lower abdominal pain, unspecified: Secondary | ICD-10-CM

## 2015-01-30 DIAGNOSIS — R159 Full incontinence of feces: Secondary | ICD-10-CM | POA: Diagnosis not present

## 2015-01-30 DIAGNOSIS — R197 Diarrhea, unspecified: Secondary | ICD-10-CM

## 2015-01-30 LAB — HEPATIC FUNCTION PANEL
ALT: 12 U/L (ref 0–35)
AST: 19 U/L (ref 0–37)
Albumin: 4.3 g/dL (ref 3.5–5.2)
Alkaline Phosphatase: 80 U/L (ref 39–117)
BILIRUBIN TOTAL: 0.3 mg/dL (ref 0.2–1.2)
Bilirubin, Direct: 0.1 mg/dL (ref 0.0–0.3)
TOTAL PROTEIN: 7 g/dL (ref 6.0–8.3)

## 2015-01-30 LAB — BASIC METABOLIC PANEL
BUN: 6 mg/dL (ref 6–23)
CALCIUM: 9.9 mg/dL (ref 8.4–10.5)
CHLORIDE: 101 meq/L (ref 96–112)
CO2: 28 mEq/L (ref 19–32)
Creatinine, Ser: 0.83 mg/dL (ref 0.40–1.20)
GFR: 72.3 mL/min (ref >60.00)
Glucose, Bld: 88 mg/dL (ref 70–99)
Potassium: 4.7 mEq/L (ref 3.5–5.1)
SODIUM: 135 meq/L (ref 135–145)

## 2015-01-30 LAB — CBC WITH DIFFERENTIAL/PLATELET
Basophils Absolute: 0 10*3/uL (ref 0.0–0.1)
Basophils Relative: 0.6 % (ref 0.0–3.0)
EOS ABS: 0.5 10*3/uL (ref 0.0–0.7)
Eosinophils Relative: 7.8 % — ABNORMAL HIGH (ref 0.0–5.0)
HEMATOCRIT: 32.9 % — AB (ref 36.0–46.0)
Hemoglobin: 11.3 g/dL — ABNORMAL LOW (ref 12.0–15.0)
LYMPHS ABS: 2.1 10*3/uL (ref 0.7–4.0)
LYMPHS PCT: 33.5 % (ref 12.0–46.0)
MCHC: 34.5 g/dL (ref 30.0–36.0)
MCV: 92 fl (ref 78.0–100.0)
MONOS PCT: 11.1 % (ref 3.0–12.0)
Monocytes Absolute: 0.7 10*3/uL (ref 0.1–1.0)
NEUTROS PCT: 47 % (ref 43.0–77.0)
Neutro Abs: 2.9 10*3/uL (ref 1.4–7.7)
Platelets: 278 10*3/uL (ref 150.0–400.0)
RBC: 3.58 Mil/uL — ABNORMAL LOW (ref 3.87–5.11)
RDW: 13.6 % (ref 11.5–15.5)
WBC: 6.1 10*3/uL (ref 4.0–10.5)

## 2015-01-30 LAB — IGA: IGA: 122 mg/dL (ref 68–378)

## 2015-01-30 LAB — TSH: TSH: 1.78 u[IU]/mL (ref 0.35–4.50)

## 2015-01-30 MED ORDER — PEG-KCL-NACL-NASULF-NA ASC-C 100 G PO SOLR: 1.0000 | Freq: Once | ORAL | Status: DC

## 2015-01-30 NOTE — Patient Instructions (Signed)
You have been scheduled for a colonoscopy. Please follow written instructions given to you at your visit today.  Please pick up your prep kit at the pharmacy within the next 1-3 days. If you use inhalers (even only as needed), please bring them with you on the day of your procedure.  Your physician has requested that you go to the basement for lab work before leaving today.  You can use over the counter Imodium 2-4 x daily for diarrhea.  You have been given a BRAT and low fat diet to follow and please avoid milk and caffeine.  Thank you for choosing me and [facility].  Pricilla Riffle. Dagoberto Ligas., MD., Marval Regal

## 2015-01-30 NOTE — Progress Notes (Signed)
History of Present Illness: This is a 69 year old female accompanied by her husband. She is having persistent problems with frequent, urgent, watery diarrhea with lower abdominal crampy pain. She has diarrhea 6-8 times per day with occasional incontinence. Her symptoms have only improved slightly over the past several weeks. All stool studies were negative. She was empirically treated with a course of vancomycin with no significant change in symptoms.  No Known Allergies Outpatient Prescriptions Prior to Visit  Medication Sig Dispense Refill  . alprazolam (XANAX) 2 MG tablet Take one-half to one tablet by mouth twice daily as needed 180 tablet 1  . dicyclomine (BENTYL) 10 MG capsule Take 1 tab 3-4 times daily as needed for cramping/pain. 90 capsule 1  . ibuprofen (ADVIL,MOTRIN) 600 MG tablet Post surgical    . levothyroxine (SYNTHROID, LEVOTHROID) 75 MCG tablet TAKE ONE TABLET BY MOUTH ONE TIME DAILY WITH BREAKFAST  90 tablet 1  . methocarbamol (ROBAXIN) 500 MG tablet Take 500 mg by mouth 4 (four) times daily.    Marland Kitchen oxyCODONE-acetaminophen (PERCOCET) 10-325 MG per tablet Take 1 tablet by mouth every 4 (four) hours as needed for pain.    Marland Kitchen sertraline (ZOLOFT) 100 MG tablet TAKE ONE AND ONE-HALF TABLET BY MOUTH NIGHTLY AT BEDTIME  45 tablet 4  . traZODone (DESYREL) 50 MG tablet 1-2 tabs po qhs prn 60 tablet 6  . diphenoxylate-atropine (LOMOTIL) 2.5-0.025 MG per tablet 1-2 tabs po q6h prn diarrhea 30 tablet 2  . saccharomyces boulardii (FLORASTOR) 250 MG capsule Take 1 capsule (250 mg total) by mouth 2 (two) times daily. 60 capsule 0  . vancomycin (VANCOCIN) 250 MG capsule Take 1 capsule (250 mg total) by mouth 4 (four) times daily. 56 capsule 0   No facility-administered medications prior to visit.   Past Medical History  Diagnosis Date  . ALLERGIC RHINITIS 06/02/2007  . DEPRESSION 06/02/2007  . INSOMNIA 06/02/2007  . Chronic low back pain 02/18/2009    Pain meds per Dr. Nelva Bush  . OSTEOPENIA  02/18/2009    Repeat DEXA 07/2014 showed osteoporosis by T score in radius but spine and hip T scores were normal-continue vit D and calcium and repeat DEXA in 2 yrs  . Arthritis     DDD of cervical, thoracic, and lumbar spine  . HYPOTHYROIDISM 06/02/2007    GOITER  . Peripheral edema     LE's, nonpitting  . Nasal septal perforation     chronic; hx of epistaxis  . Cervical spondylosis     C5-6 and C6-7.   Past Surgical History  Procedure Laterality Date  . Abdominal hysterectomy      for DUB (ovaries are still in)  . Cataract extraction Bilateral   . Cholecystectomy  2012  . Shoulder surgery Right     \  . Back surgery      X 5: fusion of L3-L4 through L5-S1  . Total hip arthroplasty  05/31/2012    Procedure: TOTAL HIP ARTHROPLASTY ANTERIOR APPROACH;  Surgeon: Mauri Pole, MD;  Location: [facility];  Service: Orthopedics;  Laterality: Left;  . Esophagogastroduodenoscopy  10/2007    Small hiatal hernia, o/w normal.  . Colonoscopy  06/2007    Diverticulosis, o/w normal.   History   Social History  . Marital Status: Married    Spouse Name: N/A    Number of Children: N/A  . Years of Education: N/A   Social History Main Topics  . Smoking status: Never Smoker   . Smokeless tobacco: Never  Used  . Alcohol Use: No  . Drug Use: No  . Sexual Activity: None   Other Topics Concern  . None   Social History Narrative   Lives with her husband in [place], has two sons.   Born and raised [employer].   Retired Programmer, multimedia related to back pain problems.   No T/A/Ds.            Family History  Problem Relation Age of Onset  . Arthritis Mother   . Stroke Mother   . Breast cancer Mother   . Diabetes Mother   . Colon cancer Father   . Heart disease Father   . Heart disease Sister   . Breast cancer Sister   . Lung cancer Brother     smoked     Physical Exam: General: Well developed , well nourished, no acute distress Head: Normocephalic and atraumatic Eyes:   sclerae anicteric, EOMI Ears: Normal auditory acuity Mouth: No deformity or lesions Lungs: Clear throughout to auscultation Heart: Regular rate and rhythm; no murmurs, rubs or bruits Abdomen: Soft, non tender and non distended. No masses, hepatosplenomegaly or hernias noted. Normal Bowel sounds Rectal: Deferred to colonoscopy Musculoskeletal: Symmetrical with no gross deformities  Pulses:  Normal pulses noted Extremities: No clubbing, cyanosis, edema or deformities noted Neurological: Alert oriented x 4, grossly nonfocal Psychological:  Alert and cooperative. Normal mood and affect  Assessment and Recommendations:  1. Diarrhea with occasional incontinence and crampy lower abdominal pain. All stool studies were negative. She did not respond to a course of vancomycin or a course of cipro and flagyl. Rule out infection, microscopic colitis, IBD. Obtain a GI pathogen panel, tTG, IgA, standard blood work. Imodium 2-4 times a day as needed and continue dicyclomine 4 times a day. Lactose free, caffeine free, low fat diet for now. Schedule colonoscopy. The risks, benefits, and alternatives to colonoscopy with possible biopsy and possible polypectomy were discussed with the patient and they consent to proceed.

## 2015-01-31 ENCOUNTER — Other Ambulatory Visit: Payer: Medicare Other

## 2015-01-31 DIAGNOSIS — R197 Diarrhea, unspecified: Secondary | ICD-10-CM | POA: Diagnosis not present

## 2015-01-31 LAB — TISSUE TRANSGLUTAMINASE, IGA: Tissue Transglutaminase Ab, IgA: 1 U/mL (ref <4)

## 2015-02-01 LAB — GASTROINTESTINAL PATHOGEN PANEL PCR
C. DIFFICILE TOX A/B, PCR: NEGATIVE
CAMPYLOBACTER, PCR: NEGATIVE
CRYPTOSPORIDIUM, PCR: NEGATIVE
E COLI (ETEC) LT/ST, PCR: NEGATIVE
E coli (STEC) stx1/stx2, PCR: NEGATIVE
E coli 0157, PCR: NEGATIVE
Giardia lamblia, PCR: NEGATIVE
Norovirus, PCR: NEGATIVE
Rotavirus A, PCR: NEGATIVE
SHIGELLA, PCR: NEGATIVE
Salmonella, PCR: NEGATIVE

## 2015-02-03 ENCOUNTER — Emergency Department (HOSPITAL_COMMUNITY)
Admission: EM | Admit: 2015-02-03 | Discharge: 2015-02-03 | Disposition: A | Discharge status: Home / Self Care | Payer: Medicare Other | Attending: Emergency Medicine | Admitting: Emergency Medicine

## 2015-02-03 ENCOUNTER — Encounter (HOSPITAL_COMMUNITY): Payer: Self-pay | Admitting: Emergency Medicine

## 2015-02-03 DIAGNOSIS — F329 Major depressive disorder, single episode, unspecified: Secondary | ICD-10-CM | POA: Insufficient documentation

## 2015-02-03 DIAGNOSIS — R1084 Generalized abdominal pain: Secondary | ICD-10-CM | POA: Insufficient documentation

## 2015-02-03 DIAGNOSIS — G8929 Other chronic pain: Secondary | ICD-10-CM | POA: Diagnosis not present

## 2015-02-03 DIAGNOSIS — R231 Pallor: Secondary | ICD-10-CM | POA: Diagnosis not present

## 2015-02-03 DIAGNOSIS — Z79899 Other long term (current) drug therapy: Secondary | ICD-10-CM | POA: Insufficient documentation

## 2015-02-03 DIAGNOSIS — Z8709 Personal history of other diseases of the respiratory system: Secondary | ICD-10-CM | POA: Insufficient documentation

## 2015-02-03 DIAGNOSIS — R197 Diarrhea, unspecified: Secondary | ICD-10-CM | POA: Diagnosis not present

## 2015-02-03 DIAGNOSIS — R5383 Other fatigue: Secondary | ICD-10-CM | POA: Diagnosis present

## 2015-02-03 DIAGNOSIS — E049 Nontoxic goiter, unspecified: Secondary | ICD-10-CM | POA: Diagnosis not present

## 2015-02-03 DIAGNOSIS — R55 Syncope and collapse: Secondary | ICD-10-CM | POA: Diagnosis not present

## 2015-02-03 DIAGNOSIS — Z8739 Personal history of other diseases of the musculoskeletal system and connective tissue: Secondary | ICD-10-CM | POA: Insufficient documentation

## 2015-02-03 DIAGNOSIS — R404 Transient alteration of awareness: Secondary | ICD-10-CM | POA: Diagnosis not present

## 2015-02-03 DIAGNOSIS — K529 Noninfective gastroenteritis and colitis, unspecified: Secondary | ICD-10-CM

## 2015-02-03 DIAGNOSIS — R531 Weakness: Secondary | ICD-10-CM | POA: Diagnosis not present

## 2015-02-03 DIAGNOSIS — G4700 Insomnia, unspecified: Secondary | ICD-10-CM

## 2015-02-03 LAB — URINALYSIS, ROUTINE W REFLEX MICROSCOPIC
BILIRUBIN URINE: NEGATIVE
Glucose, UA: NEGATIVE mg/dL
HGB URINE DIPSTICK: NEGATIVE
Ketones, ur: NEGATIVE mg/dL
Leukocytes, UA: NEGATIVE
NITRITE: NEGATIVE
PROTEIN: NEGATIVE mg/dL
Specific Gravity, Urine: 1.006 (ref 1.005–1.030)
Urobilinogen, UA: 0.2 mg/dL (ref 0.0–1.0)
pH: 5.5 (ref 5.0–8.0)

## 2015-02-03 LAB — CBC WITH DIFFERENTIAL/PLATELET
BASOS PCT: 0 % (ref 0–1)
Basophils Absolute: 0 10*3/uL (ref 0.0–0.1)
EOS PCT: 6 % — AB (ref 0–5)
Eosinophils Absolute: 0.3 10*3/uL (ref 0.0–0.7)
HEMATOCRIT: 31.5 % — AB (ref 36.0–46.0)
Hemoglobin: 10.9 g/dL — ABNORMAL LOW (ref 12.0–15.0)
LYMPHS ABS: 1.5 10*3/uL (ref 0.7–4.0)
LYMPHS PCT: 31 % (ref 12–46)
MCH: 32 pg (ref 26.0–34.0)
MCHC: 34.6 g/dL (ref 30.0–36.0)
MCV: 92.4 fL (ref 78.0–100.0)
MONO ABS: 1 10*3/uL (ref 0.1–1.0)
MONOS PCT: 21 % — AB (ref 3–12)
NEUTROS ABS: 2 10*3/uL (ref 1.7–7.7)
Neutrophils Relative %: 42 % — ABNORMAL LOW (ref 43–77)
Platelets: 241 10*3/uL (ref 150–400)
RBC: 3.41 MIL/uL — ABNORMAL LOW (ref 3.87–5.11)
RDW: 13 % (ref 11.5–15.5)
WBC: 4.7 10*3/uL (ref 4.0–10.5)

## 2015-02-03 LAB — I-STAT CHEM 8, ED
BUN: 8 mg/dL (ref 6–23)
CHLORIDE: 95 mmol/L — AB (ref 96–112)
CREATININE: 0.9 mg/dL (ref 0.50–1.10)
Calcium, Ion: 1.29 mmol/L (ref 1.13–1.30)
Glucose, Bld: 93 mg/dL (ref 70–99)
HEMATOCRIT: 34 % — AB (ref 36.0–46.0)
Hemoglobin: 11.6 g/dL — ABNORMAL LOW (ref 12.0–15.0)
POTASSIUM: 3.9 mmol/L (ref 3.5–5.1)
Sodium: 130 mmol/L — ABNORMAL LOW (ref 135–145)
TCO2: 22 mmol/L (ref 0–100)

## 2015-02-03 LAB — TROPONIN I

## 2015-02-03 MED ORDER — SODIUM CHLORIDE 0.9 % IV BOLUS (SEPSIS)
1000.0000 mL | Freq: Once | INTRAVENOUS | Status: AC
  Administered 2015-02-03: 1000 mL via INTRAVENOUS

## 2015-02-03 NOTE — ED Notes (Signed)
Pt ambulated to BR and back to room with steady gait. Denies dizziness/lightheadedness.

## 2015-02-03 NOTE — ED Notes (Addendum)
Pt arrived via EMS with c/o chronic diarrhea since October. Pt fell earlier tonight, reported generalized weakness, diffuse abd pain, and dizziness with standing. Having diarrhea x6 in 24 hrs, denies LOC or hitting head.  No injury with fall.

## 2015-02-03 NOTE — ED Notes (Signed)
Awake. Verbally responsive. A/O x4. Resp even and unlabored. No audible adventitious breath sounds noted. ABC's intact.  

## 2015-02-03 NOTE — ED Notes (Signed)
Bed: GE36 Expected date:  Expected time:  Means of arrival:  Comments: EMS 46F weakness diarrhea

## 2015-02-03 NOTE — Discharge Instructions (Signed)
As discussed her labs, EKG and urine are all within normal parameters.  Tonight it at anytime you develop new or worsening symptoms please return for further evaluation.  Otherwise, keep your appointment is scheduled with Dr. Fuller Plan on Tuesday

## 2015-02-03 NOTE — ED Provider Notes (Signed)
CSN: [id number]     Arrival date & time 02/03/15  0147 History   First MD Initiated Contact with Patient 02/03/15 0221     Chief Complaint  Patient presents with  . Fatigue     (Consider location/radiation/quality/duration/timing/severity/associated sxs/prior Treatment) HPI Comments: Patient complains of having chronic diarrhea since October 8-10 bowel movements a day with occasional incontinence.  She's been seen by Dr. Claudette Head from GI.  She's been placed on a low fat, low residue diet.  She has failed vancomycin, Cipro and Flagyl therapies.  She is scheduled for colonoscopy in several days.  Presents tonight with an incontinent stool and syncope after having a bowel movement on the toilet.  She also reports that she is not sleeping and she feels very fatigued.  The history is provided by the patient.    Past Medical History  Diagnosis Date  . ALLERGIC RHINITIS 06/02/2007  . DEPRESSION 06/02/2007  . INSOMNIA 06/02/2007  . Chronic low back pain 02/18/2009    Pain meds per Dr. Ethelene Hal  . OSTEOPENIA 02/18/2009    Repeat DEXA 07/2014 showed osteoporosis by T score in radius but spine and hip T scores were normal-continue vit D and calcium and repeat DEXA in 2 yrs  . Arthritis     DDD of cervical, thoracic, and lumbar spine  . HYPOTHYROIDISM 06/02/2007    GOITER  . Peripheral edema     LE's, nonpitting  . Nasal septal perforation     chronic; hx of epistaxis  . Cervical spondylosis     C5-6 and C6-7.   Past Surgical History  Procedure Laterality Date  . Abdominal hysterectomy      for DUB (ovaries are still in)  . Cataract extraction Bilateral   . Cholecystectomy  2012  . Shoulder surgery Right     \  . Back surgery      X 5: fusion of L3-L4 through L5-S1  . Total hip arthroplasty  05/31/2012    Procedure: TOTAL HIP ARTHROPLASTY ANTERIOR APPROACH;  Surgeon: [name], MD;  Location: [facility];  Service: Orthopedics;  Laterality: Left;  . Esophagogastroduodenoscopy  10/2007     Small hiatal hernia, o/w normal.  . Colonoscopy  06/2007    Diverticulosis, o/w normal.   Family History  Problem Relation Age of Onset  . Arthritis Mother   . Stroke Mother   . Breast cancer Mother   . Diabetes Mother   . Colon cancer Father   . Heart disease Father   . Heart disease Sister   . Breast cancer Sister   . Lung cancer Brother     smoked   History  Substance Use Topics  . Smoking status: Never Smoker   . Smokeless tobacco: Never Used  . Alcohol Use: No   OB History    No data available     Review of Systems  Unable to perform ROS Constitutional: Negative for fever.  Respiratory: Negative for shortness of breath.   Cardiovascular: Negative for chest pain.  Gastrointestinal: Positive for abdominal pain and diarrhea. Negative for nausea and vomiting.  Genitourinary: Negative for dysuria.  Neurological: Positive for dizziness and syncope. Negative for headaches.  Psychiatric/Behavioral: Positive for sleep disturbance.  All other systems reviewed and are negative.     Allergies  Review of patient's allergies indicates no known allergies.  Home Medications   Prior to Admission medications   Medication Sig Start Date End Date Taking? Authorizing Provider  alprazolam Prudy Feeler) 2 MG tablet Take  one-half to one tablet by mouth twice daily as needed 09/20/14  Yes Tammi Sou, MD  dicyclomine (BENTYL) 10 MG capsule Take 1 tab 3-4 times daily as needed for cramping/pain. 01/02/15  Yes Amy S Esterwood, PA-C  diphenoxylate-atropine (LOMOTIL) 2.5-0.025 MG per tablet Take 2 tablets by mouth 4 (four) times daily as needed for diarrhea or loose stools.   Yes Historical Provider, MD  ibuprofen (ADVIL,MOTRIN) 600 MG tablet Take 600 mg by mouth every 8 (eight) hours as needed for moderate pain. Post surgical 06/11/12  Yes Historical Provider, MD  levothyroxine (SYNTHROID, LEVOTHROID) 75 MCG tablet Take 75 mcg by mouth daily before breakfast.   Yes Historical Provider, MD   methocarbamol (ROBAXIN) 500 MG tablet Take 500 mg by mouth 4 (four) times daily.   Yes Historical Provider, MD  oxyCODONE-acetaminophen (PERCOCET) 10-325 MG per tablet Take 1 tablet by mouth every 4 (four) hours as needed for pain.   Yes Historical Provider, MD  sertraline (ZOLOFT) 100 MG tablet Take 150 mg by mouth at bedtime.   Yes Historical Provider, MD  traZODone (DESYREL) 50 MG tablet 1-2 tabs po qhs prn 12/13/14  Yes Tammi Sou, MD  levothyroxine (SYNTHROID, LEVOTHROID) 75 MCG tablet TAKE ONE TABLET BY MOUTH ONE TIME DAILY WITH BREAKFAST  Patient not taking: Reported on 02/03/2015 01/14/15   Tammi Sou, MD  peg 3350 powder (MOVIPREP) 100 G SOLR Take 1 kit (200 g total) by mouth once. 01/30/15   Ladene Artist, MD  sertraline (ZOLOFT) 100 MG tablet TAKE ONE AND ONE-HALF TABLET BY MOUTH NIGHTLY AT BEDTIME  Patient not taking: Reported on 02/03/2015 12/06/14   Tammi Sou, MD   BP 109/65 mmHg  Pulse 75  Temp(Src) 98 F (36.7 C) (Oral)  Resp 18  Ht 5' 4" (1.626 m)  Wt 156 lb (70.761 kg)  BMI 26.76 kg/m2  SpO2 96% Physical Exam  Constitutional: She is oriented to person, place, and time. She appears well-developed and well-nourished.  HENT:  Head: Normocephalic.  Eyes: Pupils are equal, round, and reactive to light.  Neck: Normal range of motion.  Cardiovascular: Normal rate and regular rhythm.   Pulmonary/Chest: Effort normal and breath sounds normal.  Abdominal: Soft. She exhibits no distension. There is generalized tenderness.  Musculoskeletal: Normal range of motion. She exhibits no edema or tenderness.  Neurological: She is alert and oriented to person, place, and time.  Skin: There is pallor.  Nursing note and vitals reviewed.   ED Course  Procedures (including critical care time) Labs Review Labs Reviewed  CBC WITH DIFFERENTIAL/PLATELET - Abnormal; Notable for the following:    RBC 3.41 (*)    Hemoglobin 10.9 (*)    HCT 31.5 (*)    Neutrophils Relative  % 42 (*)    Monocytes Relative 21 (*)    Eosinophils Relative 6 (*)    All other components within normal limits  I-STAT CHEM 8, ED - Abnormal; Notable for the following:    Sodium 130 (*)    Chloride 95 (*)    Hemoglobin 11.6 (*)    HCT 34.0 (*)    All other components within normal limits  URINALYSIS, ROUTINE W REFLEX MICROSCOPIC  TROPONIN I    Imaging Review No results found.   EKG Interpretation   Date/Time:  Sunday February 03 2015 02:46:28 EST Ventricular Rate:  68 PR Interval:  209 QRS Duration: 95 QT Interval:  428 QTC Calculation: 455 R Axis:   4 Text Interpretation:  Sinus rhythm Low voltage, precordial leads  Borderline T abnormalities, diffuse leads No significant change since last  tracing Confirmed by KOHUT  MD, STEPHEN ([id number]) on 02/03/2015 3:41:07 AM     Patient's labs, EKG, and urine have been checked, all within normal parameters.  She was given a liter fluid for slightly low sodium.  She's been ambulated without complaints of dizziness or ataxia.  Case has been reviewed with Dr. Wilson Singer patient is safe to go home and continue with the plan set forth by Dr. Fuller Plan for colonoscopy on Tuesday to help determine the cause for her chronic diarrhea.  Patient states she has the Imodium and the Bentyl at home to treat the diarrhea as well as Lomotil, which she's been taking on a regular basis.  All lab results were discussed with patient and her husband and they're comfortable going home at this time MDM   Final diagnoses:  Chronic diarrhea  Vasovagal syncope  Insomnia         Garald Balding, NP 02/03/15 [place], MD 02/03/15 [phone]

## 2015-02-12 ENCOUNTER — Encounter: Payer: Self-pay | Admitting: Gastroenterology

## 2015-02-12 ENCOUNTER — Ambulatory Visit (AMBULATORY_SURGERY_CENTER): Payer: Medicare Other | Admitting: Gastroenterology

## 2015-02-12 VITALS — BP 139/87 | HR 72 | Temp 98.7°F | Resp 17 | Ht 64.0 in | Wt 156.0 lb

## 2015-02-12 DIAGNOSIS — D509 Iron deficiency anemia, unspecified: Secondary | ICD-10-CM

## 2015-02-12 DIAGNOSIS — K5289 Other specified noninfective gastroenteritis and colitis: Secondary | ICD-10-CM | POA: Diagnosis not present

## 2015-02-12 DIAGNOSIS — R197 Diarrhea, unspecified: Secondary | ICD-10-CM

## 2015-02-12 MED ORDER — SODIUM CHLORIDE 0.9 % IV SOLN: 500.0000 mL | INTRAVENOUS | Status: DC

## 2015-02-12 NOTE — Patient Instructions (Signed)
YOU HAD AN ENDOSCOPIC PROCEDURE TODAY AT [facility]: Refer to the procedure report that was given to you for any specific questions about what was found during the examination.  If the procedure report does not answer your questions, please call your gastroenterologist to clarify.  If you requested that your care partner not be given the details of your procedure findings, then the procedure report has been included in a sealed envelope for you to review at your convenience later.  YOU SHOULD EXPECT: Some feelings of bloating in the abdomen. Passage of more gas than usual.  Walking can help get rid of the air that was put into your GI tract during the procedure and reduce the bloating. If you had a lower endoscopy (such as a colonoscopy or flexible sigmoidoscopy) you may notice spotting of blood in your stool or on the toilet paper. If you underwent a bowel prep for your procedure, then you may not have a normal bowel movement for a few days.  DIET: Your first meal following the procedure should be a light meal and then it is ok to progress to your normal diet.  A half-sandwich or bowl of soup is an example of a good first meal.  Heavy or fried foods are harder to digest and may make you feel nauseous or bloated.  Likewise meals heavy in dairy and vegetables can cause extra gas to form and this can also increase the bloating.  Drink plenty of fluids but you should avoid alcoholic beverages for 24 hours.  ACTIVITY: Your care partner should take you home directly after the procedure.  You should plan to take it easy, moving slowly for the rest of the day.  You can resume normal activity the day after the procedure however you should NOT DRIVE or use heavy machinery for 24 hours (because of the sedation medicines used during the test).    SYMPTOMS TO REPORT IMMEDIATELY: A gastroenterologist can be reached at any hour.  During normal business hours, 8:30 AM to 5:00 PM Monday through Friday,  call [phone].  After hours and on weekends, please call the GI answering service at [phone] who will take a message and have the physician on call contact you.   Following lower endoscopy (colonoscopy or flexible sigmoidoscopy):  Excessive amounts of blood in the stool  Significant tenderness or worsening of abdominal pains  Swelling of the abdomen that is new, acute  Fever of 100F or higher  FFOLLOW UP: If any biopsies were taken you will be contacted by phone or by letter within the next 1-3 weeks.  Call your gastroenterologist if you have not heard about the biopsies in 3 weeks.  Our staff will call the home number listed on your records the next business day following your procedure to check on you and address any questions or concerns that you may have at that time regarding the information given to you following your procedure. This is a courtesy call and so if there is no answer at the home number and we have not heard from you through the emergency physician on call, we will assume that you have returned to your regular daily activities without incident.  SIGNATURES/CONFIDENTIALITY: You and/or your care partner have signed paperwork which will be entered into your electronic medical record.  These signatures attest to the fact that that the information above on your After Visit Summary has been reviewed and is understood.  Full responsibility of the confidentiality of this  discharge information lies with you and/or your care-partner.   Diverticulosis information given.  Follow-up with Dr. Fuller Plan in his office-2weeks-call for appointment,  Iron twice daily with meals.  Await biopsy results.

## 2015-02-12 NOTE — Op Note (Addendum)
[facility]  [address] [place], [address]   COLONOSCOPY PROCEDURE REPORT  PATIENT: April Brewer, April Brewer  MR#: [id number] BIRTHDATE: [birth year] , 68  yrs. old GENDER: female ENDOSCOPIST: Ladene Artist, MD, [facility] PROCEDURE DATE:  02/12/2015 PROCEDURE:   Colonoscopy with biopsy First Screening Colonoscopy - Avg.  risk and is 50 yrs.  old or older - No.  Prior Negative Screening - Now for repeat screening. N/A  History of Adenoma - Now for follow-up colonoscopy & has been > or = to 3 yrs.  N/A  Polyps Removed Today? No.  Polyps Removed Today? No.  Recommend repeat exam, <10 yrs? Polyps Removed Today? No.  Recommend repeat exam, <10 yrs? No. ASA CLASS:   Class II INDICATIONS:unexplained diarrhea, fe deficiency anemia MEDICATIONS: Monitored anesthesia care and Propofol 200 mg IV DESCRIPTION OF PROCEDURE:   After the risks benefits and alternatives of the procedure were thoroughly explained, informed consent was obtained.  The digital rectal exam revealed no abnormalities of the rectum.   The LB [id number]  endoscope was introduced through the anus and advanced to the terminal ileum which was intubated for a short distance. No adverse events experienced.   The quality of the prep was adequate, using MoviPrep The instrument was then slowly withdrawn as the colon was fully examined.    COLON FINDINGS: Abnormal mucosa was found throughout the entire examined colon.  The mucosa was erythematous and had granularity in a patchy distribution.  Multiple random biopsies were performed. There was mild diverticulosis noted in the sigmoid colon with associated petechiae and muscular hypertrophy.  The examination was otherwise normal.  The examined terminal ileum appeared to be normal.  Retroflexed views revealed no abnormalities. The time to cecum=1 minutes 44 seconds.  Withdrawal time=13 minutes 44 seconds. The scope was withdrawn and the procedure  completed. COMPLICATIONS: There were no immediate complications.  ENDOSCOPIC IMPRESSION: 1.   Abnormal mucosa throughout the entire examined colon; multiple random biopsies were performed 2.   Mild diverticulosis in the sigmoid colon 3.   The examined terminal ileum appeared to be normal  RECOMMENDATIONS: 1.  Await pathology results 2.  Call to schedule a follow-up appointment with office 2 weeks 3.  FeSO4 bid with meals  eSigned:  Ladene Artist, MD, [facility] 02/12/2015 2:56 PM Revised: 02/12/2015 2:56 PM

## 2015-02-12 NOTE — Progress Notes (Signed)
Called to room to assist during endoscopic procedure.  Patient ID and intended procedure confirmed with present staff. Received instructions for my participation in the procedure from the performing physician.  

## 2015-02-12 NOTE — Progress Notes (Signed)
The pt is a poor historian about her history.  She reported not drink after 11:00 this am.  Her husband said she drank 2-4 oz of water at 12:15.  He agreed with the pt saying the last time she ate solid food was 02-09-15 at 16:30. maw

## 2015-02-12 NOTE — Progress Notes (Signed)
  [facility] Post-op Note  Patient: April Brewer  Procedure(s) Performed: Colonoscopy  Patient Location: [facility] - Recovery Area  Anesthesia Type: Deep Sedation/Propofol  Level of Consciousness: awake, oriented and patient cooperative  Airway and Oxygen Therapy: Patient Spontanous Breathing  Post-op Pain: none  Post-op Assessment:  Post-op Vital signs reviewed, Patient's Cardiovascular Status Stable, Respiratory Function Stable, Patent Airway, No signs of Nausea or vomiting and Pain level controlled  Post-op Vital Signs: Reviewed and stable  Complications: No apparent anesthesia complications  Webster Patrone E 2:55 PM

## 2015-02-13 ENCOUNTER — Telehealth: Payer: Self-pay | Admitting: *Deleted

## 2015-02-13 NOTE — Telephone Encounter (Signed)
  Follow up Call-  Call back number 02/12/2015  Post procedure Call Back phone  # [phone] hm  Permission to leave phone message Yes     Patient questions:  Do you have a fever, pain , or abdominal swelling? No. Pain Score  0 *  Have you tolerated food without any problems? Yes.    Have you been able to return to your normal activities? Yes.    Do you have any questions about your discharge instructions: Diet   No. Medications  No. Follow up visit  No.  Do you have questions or concerns about your Care? No.  Actions: * If pain score is 4 or above: No action needed, pain <4.

## 2015-02-18 ENCOUNTER — Other Ambulatory Visit: Payer: Self-pay

## 2015-02-18 MED ORDER — BUDESONIDE 3 MG PO CP24: 9.0000 mg | ORAL_CAPSULE | Freq: Every day | ORAL | Status: DC

## 2015-03-13 ENCOUNTER — Encounter: Payer: Medicare Other | Admitting: Gastroenterology

## 2015-03-14 ENCOUNTER — Encounter: Payer: Self-pay | Admitting: Gastroenterology

## 2015-03-14 ENCOUNTER — Ambulatory Visit (INDEPENDENT_AMBULATORY_CARE_PROVIDER_SITE_OTHER): Payer: Medicare Other | Admitting: Gastroenterology

## 2015-03-14 VITALS — BP 110/70 | HR 71 | Ht 64.0 in | Wt 146.6 lb

## 2015-03-14 DIAGNOSIS — K52832 Lymphocytic colitis: Secondary | ICD-10-CM | POA: Insufficient documentation

## 2015-03-14 DIAGNOSIS — K5289 Other specified noninfective gastroenteritis and colitis: Secondary | ICD-10-CM | POA: Diagnosis not present

## 2015-03-14 MED ORDER — DICYCLOMINE HCL 10 MG PO CAPS: ORAL_CAPSULE | ORAL | Status: DC

## 2015-03-14 MED ORDER — BUDESONIDE 3 MG PO CP24: 9.0000 mg | ORAL_CAPSULE | Freq: Every day | ORAL | Status: DC

## 2015-03-14 NOTE — Progress Notes (Signed)
History of Present Illness: This is a 69 year old female who recently underwent colonoscopy for unexplained diarrhea. Random biopsies showed lymphocytic colitis and she was started on budesonide 9 mg daily. He has had substantial improvement in diarrhea from 8-10 loose watery bowel movements per day now down to 1-2 formed to semi-formed bowel movements daily. She still has intermittent mild abdominal crampy pain but this has also substantially improved.  No Known Allergies Outpatient Prescriptions Prior to Visit  Medication Sig Dispense Refill  . alprazolam (XANAX) 2 MG tablet Take one-half to one tablet by mouth twice daily as needed 180 tablet 1  . budesonide (ENTOCORT EC) 3 MG 24 hr capsule Take 3 capsules (9 mg total) by mouth daily. 90 capsule 1  . dicyclomine (BENTYL) 10 MG capsule Take 1 tab 3-4 times daily as needed for cramping/pain. 90 capsule 1  . diphenoxylate-atropine (LOMOTIL) 2.5-0.025 MG per tablet Take 2 tablets by mouth 4 (four) times daily as needed for diarrhea or loose stools.    Marland Kitchen ibuprofen (ADVIL,MOTRIN) 600 MG tablet Take 600 mg by mouth every 8 (eight) hours as needed for moderate pain. Post surgical    . levothyroxine (SYNTHROID, LEVOTHROID) 75 MCG tablet TAKE ONE TABLET BY MOUTH ONE TIME DAILY WITH BREAKFAST  90 tablet 1  . methocarbamol (ROBAXIN) 500 MG tablet Take 500 mg by mouth 4 (four) times daily.    Marland Kitchen oxyCODONE-acetaminophen (PERCOCET) 10-325 MG per tablet Take 1 tablet by mouth every 4 (four) hours as needed for pain.    Marland Kitchen sertraline (ZOLOFT) 100 MG tablet TAKE ONE AND ONE-HALF TABLET BY MOUTH NIGHTLY AT BEDTIME  45 tablet 4  . traZODone (DESYREL) 50 MG tablet 1-2 tabs po qhs prn 60 tablet 6   No facility-administered medications prior to visit.   Past Medical History  Diagnosis Date  . ALLERGIC RHINITIS 06/02/2007  . DEPRESSION 06/02/2007  . INSOMNIA 06/02/2007  . Chronic low back pain 02/18/2009    Pain meds per Dr. Nelva Bush  . OSTEOPENIA 02/18/2009    Repeat  DEXA 07/2014 showed osteoporosis by T score in radius but spine and hip T scores were normal-continue vit D and calcium and repeat DEXA in 2 yrs  . Arthritis     DDD of cervical, thoracic, and lumbar spine  . HYPOTHYROIDISM 06/02/2007    GOITER  . Peripheral edema     LE's, nonpitting  . Nasal septal perforation     chronic; hx of epistaxis  . Cervical spondylosis     C5-6 and C6-7.  Marland Kitchen Allergy   . Blood transfusion without reported diagnosis   . Cataract     Bilateral removed cateracts  . GERD (gastroesophageal reflux disease)   . Heart murmur   . Osteoporosis   . Muscle spasms of lower extremity   . Neuromuscular disorder     hiatal hernia  . Lymphocytic colitis    Past Surgical History  Procedure Laterality Date  . Abdominal hysterectomy      for DUB (ovaries are still in)  . Cataract extraction Bilateral   . Cholecystectomy  2012  . Shoulder surgery Right     \  . Back surgery      X 5: fusion of L3-L4 through L5-S1  . Total hip arthroplasty  05/31/2012    Procedure: TOTAL HIP ARTHROPLASTY ANTERIOR APPROACH;  Surgeon: Mauri Pole, MD;  Location: [facility];  Service: Orthopedics;  Laterality: Left;  . Esophagogastroduodenoscopy  10/2007    Small hiatal hernia, o/w  normal.  . Colonoscopy  06/2007    Diverticulosis, o/w normal.   History   Social History  . Marital Status: Married    Spouse Name: N/A  . Number of Children: N/A  . Years of Education: N/A   Social History Main Topics  . Smoking status: Never Smoker   . Smokeless tobacco: Never Used  . Alcohol Use: No  . Drug Use: No  . Sexual Activity: Not on file   Other Topics Concern  . None   Social History Narrative   Lives with her husband in [place], has two sons.   Born and raised [place].   Retired Programmer, multimedia related to back pain problems.   No T/A/Ds.            Family History  Problem Relation Age of Onset  . Arthritis Mother   . Stroke Mother   . Breast cancer Mother   .  Diabetes Mother   . Colon cancer Father   . Heart disease Father   . Heart disease Sister   . Breast cancer Sister   . Lung cancer Brother     smoked  . Esophageal cancer Neg Hx   . Rectal cancer Neg Hx   . Stomach cancer Neg Hx     Physical Exam: General: Well developed , well nourished, no acute distress Head: Normocephalic and atraumatic Eyes:  sclerae anicteric, EOMI Ears: Normal auditory acuity Mouth: No deformity or lesions Lungs: Clear throughout to auscultation Heart: Regular rate and rhythm; no murmurs, rubs or bruits Abdomen: Soft, non tender and non distended. No masses, hepatosplenomegaly or hernias noted. Normal Bowel sounds Musculoskeletal: Symmetrical with no gross deformities  Pulses:  Normal pulses noted Extremities: No clubbing, cyanosis, edema or deformities noted Neurological: Alert oriented x 4, grossly nonfocal Psychological:  Alert and cooperative. Normal mood and affect  Assessment and Recommendations:  1. Lymphocytic colitis. Symptoms have improved but have not resolved. Continue budesonide 9 mg daily. Continue Bentyl 10 mg 4 times a day as needed. Return office visit in 6-8 weeks. Consider tapering budesonide if her symptoms are under very good control.

## 2015-03-14 NOTE — Patient Instructions (Addendum)
Your diagnosis is Lymphocytic colitis.  We have sent the following medications to your pharmacy for you to pick up at your convenience: Bentyl and Budesonide.   Your follow up appointment with Dr. Fuller Plan is on 05/07/15 at 10:15am. If you need to reschedule or cancel please call [phone].  Thank you for choosing me and [facility].  Pricilla Riffle. Dagoberto Ligas., MD., Marval Regal

## 2015-03-18 DIAGNOSIS — M5134 Other intervertebral disc degeneration, thoracic region: Secondary | ICD-10-CM | POA: Diagnosis not present

## 2015-03-18 DIAGNOSIS — G894 Chronic pain syndrome: Secondary | ICD-10-CM | POA: Diagnosis not present

## 2015-03-18 DIAGNOSIS — M5032 Other cervical disc degeneration, mid-cervical region: Secondary | ICD-10-CM | POA: Diagnosis not present

## 2015-03-18 DIAGNOSIS — M5136 Other intervertebral disc degeneration, lumbar region: Secondary | ICD-10-CM | POA: Diagnosis not present

## 2015-03-21 ENCOUNTER — Ambulatory Visit (INDEPENDENT_AMBULATORY_CARE_PROVIDER_SITE_OTHER): Payer: Medicare Other | Admitting: Family Medicine

## 2015-03-21 ENCOUNTER — Encounter: Payer: Self-pay | Admitting: Family Medicine

## 2015-03-21 VITALS — BP 104/80 | HR 68 | Temp 98.1°F | Ht 63.0 in | Wt 148.0 lb

## 2015-03-21 DIAGNOSIS — G4700 Insomnia, unspecified: Secondary | ICD-10-CM

## 2015-03-21 DIAGNOSIS — F418 Other specified anxiety disorders: Secondary | ICD-10-CM | POA: Diagnosis not present

## 2015-03-21 DIAGNOSIS — K5289 Other specified noninfective gastroenteritis and colitis: Secondary | ICD-10-CM | POA: Diagnosis not present

## 2015-03-21 DIAGNOSIS — E039 Hypothyroidism, unspecified: Secondary | ICD-10-CM

## 2015-03-21 DIAGNOSIS — F419 Anxiety disorder, unspecified: Principal | ICD-10-CM

## 2015-03-21 DIAGNOSIS — F32A Depression, unspecified: Secondary | ICD-10-CM

## 2015-03-21 DIAGNOSIS — K589 Irritable bowel syndrome without diarrhea: Secondary | ICD-10-CM | POA: Diagnosis not present

## 2015-03-21 DIAGNOSIS — F329 Major depressive disorder, single episode, unspecified: Secondary | ICD-10-CM

## 2015-03-21 DIAGNOSIS — K52832 Lymphocytic colitis: Secondary | ICD-10-CM

## 2015-03-21 MED ORDER — SERTRALINE HCL 100 MG PO TABS: ORAL_TABLET | ORAL | Status: DC

## 2015-03-21 MED ORDER — TRAZODONE HCL 50 MG PO TABS: ORAL_TABLET | ORAL | Status: DC

## 2015-03-21 MED ORDER — ALPRAZOLAM 2 MG PO TABS: ORAL_TABLET | ORAL | Status: DC

## 2015-03-21 NOTE — Progress Notes (Signed)
Pre visit review using our clinic review tool, if applicable. No additional management support is needed unless otherwise documented below in the visit note. 

## 2015-03-21 NOTE — Progress Notes (Addendum)
OFFICE NOTE  03/21/2015  CC:  Chief Complaint  Patient presents with  . Follow-up   HPI: Patient is a 69 y.o. Caucasian female who is here for 6 mo f/u anxiety, hypothyroidism, insomnia. She has been seeing Dr. Fuller Plan for her chronic diarrhea, colonoscopy showed lymphocytic colitis, she says the meds he rx's (budesonide pills) her have been helping.    She is taking levothyroxine daily.  Says sleep initiation is still a big problem.  Trazodone initially helped.  Says she often sleeps only minutes at a time, goes to bed at 10 pm and sometimes no sleep until 3 AM.  Often gets a few hours sleep after sun comes up in morning.  No daytime naps.  She currently takes 2 traz 50mg  qhs.  Mood and anxiety levels overally pretty stable, asks for RF of sertraline and alprazolam today.  Pertinent PMH:  Past medical, surgical, social, and family history reviewed and changes are noted since last office visit.  MEDS:  Outpatient Prescriptions Prior to Visit  Medication Sig Dispense Refill  . alprazolam (XANAX) 2 MG tablet Take one-half to one tablet by mouth twice daily as needed 180 tablet 1  . dicyclomine (BENTYL) 10 MG capsule Take 1 tab 3-4 times daily as needed for cramping/pain. 90 capsule 1  . diphenoxylate-atropine (LOMOTIL) 2.5-0.025 MG per tablet Take 2 tablets by mouth 4 (four) times daily as needed for diarrhea or loose stools.    Marland Kitchen ibuprofen (ADVIL,MOTRIN) 600 MG tablet Take 600 mg by mouth every 8 (eight) hours as needed for moderate pain. Post surgical    . levothyroxine (SYNTHROID, LEVOTHROID) 75 MCG tablet TAKE ONE TABLET BY MOUTH ONE TIME DAILY WITH BREAKFAST  90 tablet 1  . methocarbamol (ROBAXIN) 500 MG tablet Take 500 mg by mouth 4 (four) times daily.    Marland Kitchen oxyCODONE-acetaminophen (PERCOCET) 10-325 MG per tablet Take 1 tablet by mouth every 4 (four) hours as needed for pain.    Marland Kitchen sertraline (ZOLOFT) 100 MG tablet TAKE ONE AND ONE-HALF TABLET BY MOUTH NIGHTLY AT BEDTIME  45 tablet 4   . traZODone (DESYREL) 50 MG tablet 1-2 tabs po qhs prn 60 tablet 6  . budesonide (ENTOCORT EC) 3 MG 24 hr capsule Take 3 capsules (9 mg total) by mouth daily. (Patient not taking: Reported on 03/21/2015) 90 capsule 1   No facility-administered medications prior to visit.    PE: Blood pressure 104/80, pulse 68, temperature 98.1 F (36.7 C), temperature source Oral, height 5\' 3"  (1.6 m), weight 148 lb (67.132 kg), SpO2 98 %. Gen: Alert, well appearing.  Patient is oriented to person, place, time, and situation. AFFECT: pleasant, lucid thought and speech. No further exam today.  Lab Results  Component Value Date   TSH 1.78 01/30/2015     Chemistry      Component Value Date/Time   NA 130* 02/03/2015 0326   K 3.9 02/03/2015 0326   CL 95* 02/03/2015 0326   CO2 28 01/30/2015 1148   BUN 8 02/03/2015 0326   CREATININE 0.90 02/03/2015 0326      Component Value Date/Time   CALCIUM 9.9 01/30/2015 1148   ALKPHOS 80 01/30/2015 1148   AST 19 01/30/2015 1148   ALT 12 01/30/2015 1148   BILITOT 0.3 01/30/2015 1148     Lab Results  Component Value Date   WBC 4.7 02/03/2015   HGB 11.6* 02/03/2015   HCT 34.0* 02/03/2015   MCV 92.4 02/03/2015   PLT 241 02/03/2015   Lab Results  Component Value Date   IRON 52 12/13/2014   FERRITIN 28.5 12/13/2014    IMPRESSION AND PLAN:  1) Anxiety and depression; The current medical regimen is effective;  continue present plan and medications.  2) Hypothyroidism; The current medical regimen is effective;  continue present plan and medications.  3) Insomnia; increase trazodone to 150mg  qhs, then try max dose of 200mg  qhs prn. If this max dose is not helpful then we'll consider trial of different rx sleep med.  4) Lymphocytic colitis; improved since seeing her GI MD and getting put on budesonide 9mg  po qd.  Continue current mgmt.  An After Visit Summary was printed and given to the patient.  FOLLOW UP: 6 mo

## 2015-03-26 ENCOUNTER — Encounter: Payer: Self-pay | Admitting: Family Medicine

## 2015-04-22 ENCOUNTER — Telehealth: Payer: Self-pay | Admitting: Family Medicine

## 2015-04-22 NOTE — Telephone Encounter (Signed)
Last rx I did 03/21/15 was 90 day supply, with 1 additional RF.  She should not need any of this med at this time.

## 2015-04-22 NOTE — Telephone Encounter (Signed)
Rf request for alprazolam.  Last OV 03/21/15.  Last RX printed 03/21/15 x 1 rf.  Please advise rf.

## 2015-04-22 NOTE — Telephone Encounter (Signed)
Spoke to pharmacy.  Pt still has a rf on RX.  Please disregard request.

## 2015-04-24 ENCOUNTER — Other Ambulatory Visit: Payer: Self-pay | Admitting: Gastroenterology

## 2015-04-28 DIAGNOSIS — D649 Anemia, unspecified: Secondary | ICD-10-CM

## 2015-04-28 HISTORY — DX: Anemia, unspecified: D64.9

## 2015-05-07 ENCOUNTER — Encounter: Payer: Self-pay | Admitting: Gastroenterology

## 2015-05-07 ENCOUNTER — Ambulatory Visit (INDEPENDENT_AMBULATORY_CARE_PROVIDER_SITE_OTHER): Payer: Medicare Other | Admitting: Gastroenterology

## 2015-05-07 ENCOUNTER — Other Ambulatory Visit (INDEPENDENT_AMBULATORY_CARE_PROVIDER_SITE_OTHER): Payer: Medicare Other

## 2015-05-07 VITALS — BP 132/70 | HR 64 | Ht 64.0 in | Wt 141.0 lb

## 2015-05-07 DIAGNOSIS — R197 Diarrhea, unspecified: Secondary | ICD-10-CM

## 2015-05-07 DIAGNOSIS — K5289 Other specified noninfective gastroenteritis and colitis: Secondary | ICD-10-CM | POA: Diagnosis not present

## 2015-05-07 DIAGNOSIS — K52832 Lymphocytic colitis: Secondary | ICD-10-CM

## 2015-05-07 LAB — COMPREHENSIVE METABOLIC PANEL
ALT: 10 U/L (ref 0–35)
AST: 14 U/L (ref 0–37)
Albumin: 3.7 g/dL (ref 3.5–5.2)
Alkaline Phosphatase: 67 U/L (ref 39–117)
BUN: 10 mg/dL (ref 6–23)
CHLORIDE: 99 meq/L (ref 96–112)
CO2: 28 mEq/L (ref 19–32)
CREATININE: 0.82 mg/dL (ref 0.40–1.20)
Calcium: 9.6 mg/dL (ref 8.4–10.5)
GFR: 73.26 mL/min (ref >60.00)
Glucose, Bld: 94 mg/dL (ref 70–99)
Potassium: 4.1 mEq/L (ref 3.5–5.1)
Sodium: 133 mEq/L — ABNORMAL LOW (ref 135–145)
Total Bilirubin: 0.3 mg/dL (ref 0.2–1.2)
Total Protein: 6.6 g/dL (ref 6.0–8.3)

## 2015-05-07 LAB — TSH: TSH: 0.88 u[IU]/mL (ref 0.35–4.50)

## 2015-05-07 LAB — CBC WITH DIFFERENTIAL/PLATELET
BASOS ABS: 0 10*3/uL (ref 0.0–0.1)
BASOS PCT: 0.4 % (ref 0.0–3.0)
Eosinophils Absolute: 0.2 10*3/uL (ref 0.0–0.7)
Eosinophils Relative: 3.3 % (ref 0.0–5.0)
HCT: 33 % — ABNORMAL LOW (ref 36.0–46.0)
HEMOGLOBIN: 11.5 g/dL — AB (ref 12.0–15.0)
LYMPHS ABS: 2.2 10*3/uL (ref 0.7–4.0)
LYMPHS PCT: 35 % (ref 12.0–46.0)
MCHC: 34.7 g/dL (ref 30.0–36.0)
MCV: 92.9 fl (ref 78.0–100.0)
MONO ABS: 1.1 10*3/uL — AB (ref 0.1–1.0)
Monocytes Relative: 17.5 % — ABNORMAL HIGH (ref 3.0–12.0)
NEUTROS ABS: 2.8 10*3/uL (ref 1.4–7.7)
Neutrophils Relative %: 43.8 % (ref 43.0–77.0)
Platelets: 255 10*3/uL (ref 150.0–400.0)
RBC: 3.56 Mil/uL — AB (ref 3.87–5.11)
RDW: 14.2 % (ref 11.5–15.5)
WBC: 6.3 10*3/uL (ref 4.0–10.5)

## 2015-05-07 NOTE — Patient Instructions (Signed)
Your physician has requested that you go to the basement for the following lab work before leaving today: GI pathogen panel, [place] Panel  We will contact you with results of your labwork and further workup can be determined at that time.

## 2015-05-07 NOTE — Progress Notes (Signed)
    History of Present Illness: This is a 69 year old female with lymphocytic colitis. She is accompanied by her husband. Her diarrhea was well controlled on budesonide 9 mg daily for several weeks ever she was still having 2-4 loose bowel movements daily with some urgency and cramping. Over the past 2 weeks she has developed increased frequency of stools and looser stools with more frequent abdominal cramping and significant urgency. No clear precipitating event.  Current Medications, Allergies, Past Medical History, Past Surgical History, Family History and Social History were reviewed in [employer] record.  Physical Exam: General: Well developed , well nourished, no acute distress Head: Normocephalic and atraumatic Eyes:  sclerae anicteric, EOMI Ears: Normal auditory acuity Mouth: No deformity or lesions Lungs: Clear throughout to auscultation Heart: Regular rate and rhythm; no murmurs, rubs or bruits Abdomen: Soft, non tender and non distended. No masses, hepatosplenomegaly or hernias noted. Normal Bowel sounds Musculoskeletal: Symmetrical with no gross deformities  Pulses:  Normal pulses noted Extremities: No clubbing, cyanosis, edema or deformities noted Neurological: Alert oriented x 4, grossly nonfocal Psychological:  Alert and cooperative. Normal mood and affect  Assessment and Recommendations:  1. Worsening diarrhea. Lymphocytic colitis. Symptoms previously fairly well-controlled on budesonide 9 mg daily. Worsening diarrhea could be infectious. Possible IBS. Obtain standard blood work and a GI pathogen panel. Bland diet. Continue budesonide 9 mg daily. Continue dicyclomine 10 mg 4 times a day as needed. Continue Lomotil twice a day as needed. Return office visit 4 weeks.

## 2015-05-08 ENCOUNTER — Other Ambulatory Visit: Payer: Medicare Other

## 2015-05-08 DIAGNOSIS — R197 Diarrhea, unspecified: Secondary | ICD-10-CM

## 2015-05-08 DIAGNOSIS — K5289 Other specified noninfective gastroenteritis and colitis: Secondary | ICD-10-CM | POA: Diagnosis not present

## 2015-05-09 ENCOUNTER — Other Ambulatory Visit: Payer: Self-pay | Admitting: *Deleted

## 2015-05-09 ENCOUNTER — Telehealth: Payer: Self-pay | Admitting: *Deleted

## 2015-05-09 NOTE — Telephone Encounter (Signed)
OK, noted

## 2015-05-09 NOTE — Telephone Encounter (Signed)
Error

## 2015-05-09 NOTE — Telephone Encounter (Signed)
FYI: Pt LMOM stating that her insurance will only cover #60 of her xanax. She stated that on the last Rx it was written for #180. She just wanted to let you know because she will need to have a new Rx written sooner since they can only fill #60 at a time.

## 2015-05-16 ENCOUNTER — Other Ambulatory Visit: Payer: Self-pay | Admitting: Gastroenterology

## 2015-05-17 ENCOUNTER — Telehealth: Payer: Self-pay | Admitting: Gastroenterology

## 2015-05-17 LAB — GASTROINTESTINAL PATHOGEN PANEL PCR
C. difficile Tox A/B, PCR: NEGATIVE
Campylobacter, PCR: NEGATIVE
Cryptosporidium, PCR: NEGATIVE
E coli (ETEC) LT/ST PCR: NEGATIVE
E coli (STEC) stx1/stx2, PCR: NEGATIVE
E coli 0157, PCR: NEGATIVE
GIARDIA LAMBLIA, PCR: NEGATIVE
NOROVIRUS, PCR: NEGATIVE
ROTAVIRUS, PCR: NEGATIVE
Salmonella, PCR: NEGATIVE
Shigella, PCR: NEGATIVE

## 2015-05-17 NOTE — Telephone Encounter (Signed)
I spoke with the patient.  She turned in the specimen on 05/08/15.  I spoke with [facility] and they do have the specimen, but it has not resulted,  They are going to look into it and call me back

## 2015-05-17 NOTE — Telephone Encounter (Signed)
[facility] returned call and the pathogen panel will result tonight or tomorrow am. Patient notified I will call her next week with the results

## 2015-05-23 ENCOUNTER — Ambulatory Visit (INDEPENDENT_AMBULATORY_CARE_PROVIDER_SITE_OTHER): Payer: Medicare Other | Admitting: Family Medicine

## 2015-05-23 ENCOUNTER — Encounter: Payer: Self-pay | Admitting: Family Medicine

## 2015-05-23 VITALS — BP 132/84 | HR 60 | Temp 97.8°F | Resp 16 | Wt 140.0 lb

## 2015-05-23 DIAGNOSIS — Z79899 Other long term (current) drug therapy: Secondary | ICD-10-CM

## 2015-05-23 DIAGNOSIS — E871 Hypo-osmolality and hyponatremia: Secondary | ICD-10-CM

## 2015-05-23 DIAGNOSIS — D649 Anemia, unspecified: Secondary | ICD-10-CM | POA: Diagnosis not present

## 2015-05-23 DIAGNOSIS — I10 Essential (primary) hypertension: Secondary | ICD-10-CM

## 2015-05-23 DIAGNOSIS — Z13220 Encounter for screening for lipoid disorders: Secondary | ICD-10-CM | POA: Diagnosis not present

## 2015-05-23 LAB — BASIC METABOLIC PANEL
BUN: 10 mg/dL (ref 6–23)
CO2: 28 meq/L (ref 19–32)
Calcium: 9.2 mg/dL (ref 8.4–10.5)
Chloride: 103 mEq/L (ref 96–112)
Creatinine, Ser: 0.8 mg/dL (ref 0.40–1.20)
GFR: 75.37 mL/min (ref >60.00)
GLUCOSE: 89 mg/dL (ref 70–99)
POTASSIUM: 4.3 meq/L (ref 3.5–5.1)
Sodium: 135 mEq/L (ref 135–145)

## 2015-05-23 LAB — VITAMIN B12: Vitamin B-12: 317 pg/mL (ref 211–911)

## 2015-05-23 LAB — IBC PANEL
Iron: 123 ug/dL (ref 42–145)
SATURATION RATIOS: 48.8 % (ref 20.0–50.0)
Transferrin: 180 mg/dL — ABNORMAL LOW (ref 212.0–360.0)

## 2015-05-23 LAB — FERRITIN: FERRITIN: 71.8 ng/mL (ref 10.0–291.0)

## 2015-05-23 NOTE — Progress Notes (Signed)
Pre visit review using our clinic review tool, if applicable. No additional management support is needed unless otherwise documented below in the visit note. 

## 2015-05-23 NOTE — Progress Notes (Signed)
OFFICE NOTE  05/23/2015  CC:  Chief Complaint  Patient presents with  . Follow-up    6 month follow up.      HPI: Patient is a 69 y.o. Caucasian female who is here : "Dr. Fuller Plan sent me here for further evaluation of my anemia and low sodium". Also desires screening for hypercholesterolemia. No visible blood in stool or urine.  She is s/p hysterectomy so has no vag bleeding. She reports eating a balanced diet BUT she eats only one meal a day (evening meal).  "This is all we (she and her husband) feel like eating". Has had recent problems with chronic diarrhea that GI has been seeing her for--has been on oral budesonide for lymphocytic colitis and had been improving but seems to be doing worse of late and recent re-eval done by GI turned up no new findings per pt report today.  Pertinent PMH:  Past medical, surgical, social, and family history reviewed and no changes are noted since last office visit.  MEDS: Takes ferrous sulfate 325mg  OTC tab once daily Outpatient Prescriptions Prior to Visit  Medication Sig Dispense Refill  . alprazolam (XANAX) 2 MG tablet Take one-half to one tablet by mouth twice daily as needed 180 tablet 1  . budesonide (ENTOCORT EC) 3 MG 24 hr capsule TAKE THREE CAPSULES BY MOUTH DAILY 90 capsule 0  . dicyclomine (BENTYL) 10 MG capsule TAKE ONE CAPSULE BY MOUTH THREE OR FOUR TIMES DAILY AS NEEDED FOR CRAMPING/PAIN 90 capsule 1  . diphenoxylate-atropine (LOMOTIL) 2.5-0.025 MG per tablet Take 2 tablets by mouth 4 (four) times daily as needed for diarrhea or loose stools.    . ferrous sulfate 325 (65 FE) MG tablet Take 325 mg by mouth 2 (two) times daily with a meal.    . ibuprofen (ADVIL,MOTRIN) 600 MG tablet Take 600 mg by mouth every 8 (eight) hours as needed for moderate pain. Post surgical    . levothyroxine (SYNTHROID, LEVOTHROID) 75 MCG tablet TAKE ONE TABLET BY MOUTH ONE TIME DAILY WITH BREAKFAST  90 tablet 1  . methocarbamol (ROBAXIN) 500 MG tablet Take 500  mg by mouth 4 (four) times daily.    Marland Kitchen oxyCODONE-acetaminophen (PERCOCET) 10-325 MG per tablet Take 1 tablet by mouth every 4 (four) hours as needed for pain.    Marland Kitchen sertraline (ZOLOFT) 100 MG tablet TAKE ONE AND ONE-HALF TABLET BY MOUTH NIGHTLY AT BEDTIME 135 tablet 3  . traZODone (DESYREL) 50 MG tablet 3-4 tabs po qhs prn insomnia 120 tablet 6   No facility-administered medications prior to visit.    PE: Blood pressure 132/84, pulse 60, temperature 97.8 F (36.6 C), temperature source Oral, resp. rate 16, weight 140 lb (63.504 kg), SpO2 96 %. Gen: Alert, well appearing.  Patient is oriented to person, place, time, and situation. MPN:TIRW: no injection, icteris, swelling, or exudate.  EOMI, PERRLA. Mouth: lips without lesion/swelling.  Oral mucosa pink and moist. Oropharynx without erythema, exudate, or swelling.  CV: RRR, no m/r/g.   LUNGS: CTA bilat, nonlabored resps, good aeration in all lung fields. EXT: no clubbing, cyanosis, or edema.  No significant pallor and no jaundice  LABS:  Lab Results  Component Value Date   TSH 0.88 05/07/2015     Chemistry      Component Value Date/Time   NA 133* 05/07/2015 1005   K 4.1 05/07/2015 1005   CL 99 05/07/2015 1005   CO2 28 05/07/2015 1005   BUN 10 05/07/2015 1005   CREATININE 0.82 05/07/2015  1005      Component Value Date/Time   CALCIUM 9.6 05/07/2015 1005   ALKPHOS 67 05/07/2015 1005   AST 14 05/07/2015 1005   ALT 10 05/07/2015 1005   BILITOT 0.3 05/07/2015 1005    Total protein level 05/07/15 was 6.6 (wnl)  Lab Results  Component Value Date   WBC 6.3 05/07/2015   HGB 11.5* 05/07/2015   HCT 33.0* 05/07/2015   MCV 92.9 05/07/2015   PLT 255.0 05/07/2015   Lab Results  Component Value Date   CHOL 243* 12/16/2011   HDL 93.70 12/16/2011   LDLDIRECT 133.3 12/16/2011   TRIG 82.0 12/16/2011   CHOLHDL 3 12/16/2011    IMPRESSION AND PLAN:  1) Mild normocytic anemia; she is taking iron sulfate 325mg  once daily  chronically. Will recheck iron stores today as well as repeat CBC and check Vit B12 level. Check hemoccults x 3.  2) Mild hyponatremia (hypoosmolar: her calculated serum osmolality from her 05/07/15 labs is 278, a little on the low side):  I suspect this is due to mild volume depletion from her diarrhea, with ongoing water intake + increased water reabsorption from kidneys in response to the fluid loss in her stools. Another cause in her could be and ADH-like effect that SSRI's and narcotics can have on the kidneys.  Additionally, she reports a pretty low "solute" intake--one meal a day--and may not be providing adequate protein and NaCl for her and in the face of relatively impaired water excretion of older patients this could cause hyponatremia.  At any rate, her Na was only mildly low, so my initial plan is to first repeat her BMET today.  If Na is falling then will do urine Na and urine osmolality testing.   Reassured pt today.  3) Lymphocytic colitis: ongoing management per GI.  4) Screening for hyperlipidemia: fasting lipid panel today.  An After Visit Summary was printed and given to the patient.  FOLLOW UP: keep f/u already set for 09/19/15.

## 2015-05-24 LAB — LIPID PANEL
Cholesterol: 223 mg/dL — ABNORMAL HIGH (ref 0–200)
HDL: 55.3 mg/dL (ref >39.00)
LDL Cholesterol: 147 mg/dL — ABNORMAL HIGH (ref 0–99)
NonHDL: 167.7
Total CHOL/HDL Ratio: 4
Triglycerides: 102 mg/dL (ref 0.0–149.0)
VLDL: 20.4 mg/dL (ref 0.0–40.0)

## 2015-05-24 LAB — CBC
HCT: 31.5 % — ABNORMAL LOW (ref 36.0–46.0)
Hemoglobin: 10.5 g/dL — ABNORMAL LOW (ref 12.0–15.0)
MCHC: 33.4 g/dL (ref 30.0–36.0)
MCV: 95.7 fl (ref 78.0–100.0)
Platelets: 229 10*3/uL (ref 150.0–400.0)
RBC: 3.29 Mil/uL — ABNORMAL LOW (ref 3.87–5.11)
RDW: 15 % (ref 11.5–15.5)
WBC: 5.3 10*3/uL (ref 4.0–10.5)

## 2015-05-30 DIAGNOSIS — Z1231 Encounter for screening mammogram for malignant neoplasm of breast: Secondary | ICD-10-CM | POA: Diagnosis not present

## 2015-05-31 ENCOUNTER — Encounter: Payer: Self-pay | Admitting: Family Medicine

## 2015-05-31 NOTE — Addendum Note (Signed)
Addended by: Lanae Crumbly on: 05/31/2015 12:03 PM   Modules accepted: Orders

## 2015-06-04 ENCOUNTER — Other Ambulatory Visit (INDEPENDENT_AMBULATORY_CARE_PROVIDER_SITE_OTHER): Payer: Medicare Other

## 2015-06-04 DIAGNOSIS — D649 Anemia, unspecified: Secondary | ICD-10-CM | POA: Diagnosis not present

## 2015-06-04 LAB — CBC
HEMATOCRIT: 35.3 % — AB (ref 36.0–46.0)
Hemoglobin: 12 g/dL (ref 12.0–15.0)
MCHC: 34 g/dL (ref 30.0–36.0)
MCV: 94.1 fl (ref 78.0–100.0)
Platelets: 245 10*3/uL (ref 150.0–400.0)
RBC: 3.74 Mil/uL — ABNORMAL LOW (ref 3.87–5.11)
RDW: 14.1 % (ref 11.5–15.5)
WBC: 5.4 10*3/uL (ref 4.0–10.5)

## 2015-06-05 ENCOUNTER — Other Ambulatory Visit: Payer: Self-pay | Admitting: Gastroenterology

## 2015-06-05 NOTE — Addendum Note (Signed)
Addended by: Lanae Crumbly on: 06/05/2015 02:32 PM   Modules accepted: Orders

## 2015-06-06 ENCOUNTER — Other Ambulatory Visit: Payer: Self-pay | Admitting: Family Medicine

## 2015-06-06 ENCOUNTER — Telehealth: Payer: Self-pay | Admitting: *Deleted

## 2015-06-06 ENCOUNTER — Other Ambulatory Visit (INDEPENDENT_AMBULATORY_CARE_PROVIDER_SITE_OTHER): Payer: Medicare Other

## 2015-06-06 DIAGNOSIS — D649 Anemia, unspecified: Secondary | ICD-10-CM

## 2015-06-06 DIAGNOSIS — E871 Hypo-osmolality and hyponatremia: Secondary | ICD-10-CM

## 2015-06-06 LAB — FECAL OCCULT BLOOD, IMMUNOCHEMICAL: FECAL OCCULT BLD: NEGATIVE

## 2015-06-12 NOTE — Telephone Encounter (Signed)
Error

## 2015-06-13 ENCOUNTER — Other Ambulatory Visit: Payer: Self-pay | Admitting: Gastroenterology

## 2015-06-14 ENCOUNTER — Encounter: Payer: Self-pay | Admitting: Family Medicine

## 2015-06-17 ENCOUNTER — Telehealth: Payer: Self-pay | Admitting: Gastroenterology

## 2015-06-17 ENCOUNTER — Telehealth: Payer: Self-pay | Admitting: Internal Medicine

## 2015-06-17 NOTE — Telephone Encounter (Signed)
Error

## 2015-06-17 NOTE — Telephone Encounter (Signed)
Patient is no longer able to afford entocort.  Is there an alternative medication?

## 2015-06-18 NOTE — Telephone Encounter (Signed)
Patient notified that Dr. Fuller Plan is not in the office until Thursday She is not able to afford entocort and it is not helping her at all.  Dr. Fuller Plan is there an alternative you can recommend

## 2015-06-19 MED ORDER — CHOLESTYRAMINE 4 G PO PACK: 4.0000 g | PACK | Freq: Three times a day (TID) | ORAL | Status: DC

## 2015-06-19 NOTE — Telephone Encounter (Signed)
No answer/machine.   New rx sent to the pharmacy

## 2015-06-19 NOTE — Telephone Encounter (Signed)
Questran 1 packet bid to tid

## 2015-06-20 NOTE — Telephone Encounter (Signed)
Patient notified She is advised to not take it within 2 hours of any other medications.

## 2015-06-24 DIAGNOSIS — H524 Presbyopia: Secondary | ICD-10-CM | POA: Diagnosis not present

## 2015-06-24 DIAGNOSIS — H35033 Hypertensive retinopathy, bilateral: Secondary | ICD-10-CM | POA: Diagnosis not present

## 2015-06-24 DIAGNOSIS — H52229 Regular astigmatism, unspecified eye: Secondary | ICD-10-CM | POA: Diagnosis not present

## 2015-06-24 DIAGNOSIS — H5200 Hypermetropia, unspecified eye: Secondary | ICD-10-CM | POA: Diagnosis not present

## 2015-06-24 DIAGNOSIS — H26493 Other secondary cataract, bilateral: Secondary | ICD-10-CM | POA: Diagnosis not present

## 2015-06-28 ENCOUNTER — Other Ambulatory Visit: Payer: Self-pay | Admitting: *Deleted

## 2015-06-28 ENCOUNTER — Other Ambulatory Visit: Payer: Self-pay

## 2015-06-28 MED ORDER — DICYCLOMINE HCL 10 MG PO CAPS: ORAL_CAPSULE | ORAL | Status: DC

## 2015-06-28 MED ORDER — LEVOTHYROXINE SODIUM 75 MCG PO TABS: ORAL_TABLET | ORAL | Status: DC

## 2015-06-28 MED ORDER — SERTRALINE HCL 100 MG PO TABS: ORAL_TABLET | ORAL | Status: DC

## 2015-06-28 MED ORDER — TRAZODONE HCL 50 MG PO TABS
ORAL_TABLET | ORAL | Status: DC
Start: 2015-06-28 — End: 2015-07-08

## 2015-06-28 MED ORDER — ALPRAZOLAM 2 MG PO TABS: ORAL_TABLET | ORAL | Status: DC

## 2015-06-28 NOTE — Telephone Encounter (Signed)
RF request for Alprazolam adn trazodone. LOV: 05/23/15 Next ov: 09/19/15 Last written: 03/21/15 #180 w/1RF and 03/21/15 #120 w/ 6RF Please advise. Thanks.

## 2015-06-28 NOTE — Telephone Encounter (Signed)
RF request for Sertraline and Levothyroxine.  LOV: 05/23/15 Next ov: 09/19/15 Last written: 03/21/15 #135 w/ 3RF and 01/14/15 #90 w/ 1RF

## 2015-07-08 ENCOUNTER — Other Ambulatory Visit: Payer: Self-pay | Admitting: *Deleted

## 2015-07-08 MED ORDER — TRAZODONE HCL 50 MG PO TABS: ORAL_TABLET | ORAL | Status: DC

## 2015-07-08 NOTE — Telephone Encounter (Signed)
Pt LMOM stating that [facility] needs a new Rx sent with full qty of what pt is allowed to take. She stated that she takes 4 at bedtime so the qty will need to be changed from #120 to #360. Please advise. Thanks.

## 2015-07-15 DIAGNOSIS — M542 Cervicalgia: Secondary | ICD-10-CM | POA: Diagnosis not present

## 2015-07-15 DIAGNOSIS — M5032 Other cervical disc degeneration, mid-cervical region: Secondary | ICD-10-CM | POA: Diagnosis not present

## 2015-07-17 ENCOUNTER — Other Ambulatory Visit (INDEPENDENT_AMBULATORY_CARE_PROVIDER_SITE_OTHER): Payer: Medicare Other

## 2015-07-17 DIAGNOSIS — D649 Anemia, unspecified: Secondary | ICD-10-CM | POA: Diagnosis not present

## 2015-07-17 LAB — CBC
HCT: 30.9 % — ABNORMAL LOW (ref 36.0–46.0)
Hemoglobin: 10.5 g/dL — ABNORMAL LOW (ref 12.0–15.0)
MCHC: 34.2 g/dL (ref 30.0–36.0)
MCV: 96.2 fl (ref 78.0–100.0)
PLATELETS: 221 10*3/uL (ref 150.0–400.0)
RBC: 3.21 Mil/uL — AB (ref 3.87–5.11)
RDW: 13.8 % (ref 11.5–15.5)
WBC: 4.8 10*3/uL (ref 4.0–10.5)

## 2015-08-07 ENCOUNTER — Emergency Department (HOSPITAL_BASED_OUTPATIENT_CLINIC_OR_DEPARTMENT_OTHER)
Admission: EM | Admit: 2015-08-07 | Discharge: 2015-08-07 | Disposition: A | Discharge status: Home / Self Care | Payer: Medicare Other | Attending: Emergency Medicine | Admitting: Emergency Medicine

## 2015-08-07 ENCOUNTER — Encounter (HOSPITAL_BASED_OUTPATIENT_CLINIC_OR_DEPARTMENT_OTHER): Payer: Self-pay

## 2015-08-07 ENCOUNTER — Encounter: Payer: Self-pay | Admitting: Family Medicine

## 2015-08-07 ENCOUNTER — Ambulatory Visit (INDEPENDENT_AMBULATORY_CARE_PROVIDER_SITE_OTHER): Payer: Medicare Other | Admitting: Family Medicine

## 2015-08-07 VITALS — BP 89/69 | HR 70 | Temp 97.6°F | Resp 16 | Ht 64.0 in | Wt 127.0 lb

## 2015-08-07 DIAGNOSIS — E871 Hypo-osmolality and hyponatremia: Secondary | ICD-10-CM | POA: Diagnosis not present

## 2015-08-07 DIAGNOSIS — D649 Anemia, unspecified: Secondary | ICD-10-CM | POA: Diagnosis not present

## 2015-08-07 DIAGNOSIS — G8929 Other chronic pain: Secondary | ICD-10-CM | POA: Insufficient documentation

## 2015-08-07 DIAGNOSIS — R011 Cardiac murmur, unspecified: Secondary | ICD-10-CM | POA: Diagnosis not present

## 2015-08-07 DIAGNOSIS — Z79899 Other long term (current) drug therapy: Secondary | ICD-10-CM | POA: Diagnosis not present

## 2015-08-07 DIAGNOSIS — M1990 Unspecified osteoarthritis, unspecified site: Secondary | ICD-10-CM | POA: Insufficient documentation

## 2015-08-07 DIAGNOSIS — F329 Major depressive disorder, single episode, unspecified: Secondary | ICD-10-CM | POA: Insufficient documentation

## 2015-08-07 DIAGNOSIS — Z8719 Personal history of other diseases of the digestive system: Secondary | ICD-10-CM | POA: Diagnosis not present

## 2015-08-07 DIAGNOSIS — R630 Anorexia: Secondary | ICD-10-CM | POA: Diagnosis not present

## 2015-08-07 DIAGNOSIS — R7989 Other specified abnormal findings of blood chemistry: Secondary | ICD-10-CM | POA: Diagnosis present

## 2015-08-07 DIAGNOSIS — E039 Hypothyroidism, unspecified: Secondary | ICD-10-CM | POA: Insufficient documentation

## 2015-08-07 DIAGNOSIS — R195 Other fecal abnormalities: Secondary | ICD-10-CM | POA: Insufficient documentation

## 2015-08-07 DIAGNOSIS — I959 Hypotension, unspecified: Secondary | ICD-10-CM | POA: Insufficient documentation

## 2015-08-07 DIAGNOSIS — G4700 Insomnia, unspecified: Secondary | ICD-10-CM | POA: Diagnosis not present

## 2015-08-07 LAB — CBC
HEMATOCRIT: 35 % — AB (ref 36.0–46.0)
Hemoglobin: 11.7 g/dL — ABNORMAL LOW (ref 12.0–15.0)
MCHC: 33.4 g/dL (ref 30.0–36.0)
MCV: 97.3 fl (ref 78.0–100.0)
PLATELETS: 298 10*3/uL (ref 150.0–400.0)
RBC: 3.59 Mil/uL — AB (ref 3.87–5.11)
RDW: 14.1 % (ref 11.5–15.5)
WBC: 6.4 10*3/uL (ref 4.0–10.5)

## 2015-08-07 LAB — COMPREHENSIVE METABOLIC PANEL
ALT: 12 U/L (ref 0–35)
AST: 15 U/L (ref 0–37)
Albumin: 3.9 g/dL (ref 3.5–5.2)
Alkaline Phosphatase: 69 U/L (ref 39–117)
BUN: 11 mg/dL (ref 6–23)
CHLORIDE: 98 meq/L (ref 96–112)
CO2: 29 meq/L (ref 19–32)
CREATININE: 0.94 mg/dL (ref 0.40–1.20)
Calcium: 9.8 mg/dL (ref 8.4–10.5)
GFR: 62.53 mL/min (ref >60.00)
Glucose, Bld: 94 mg/dL (ref 70–99)
Potassium: 4.6 mEq/L (ref 3.5–5.1)
Sodium: 135 mEq/L (ref 135–145)
Total Bilirubin: 0.3 mg/dL (ref 0.2–1.2)
Total Protein: 6.5 g/dL (ref 6.0–8.3)

## 2015-08-07 LAB — CBC WITH DIFFERENTIAL/PLATELET
Basophils Absolute: 0 10*3/uL (ref 0.0–0.1)
Basophils Relative: 0 % (ref 0–1)
Eosinophils Absolute: 0.2 10*3/uL (ref 0.0–0.7)
Eosinophils Relative: 4 % (ref 0–5)
HEMATOCRIT: 31 % — AB (ref 36.0–46.0)
HEMOGLOBIN: 10.7 g/dL — AB (ref 12.0–15.0)
Lymphocytes Relative: 36 % (ref 12–46)
Lymphs Abs: 1.7 10*3/uL (ref 0.7–4.0)
MCH: 33 pg (ref 26.0–34.0)
MCHC: 34.5 g/dL (ref 30.0–36.0)
MCV: 95.7 fL (ref 78.0–100.0)
MONO ABS: 1 10*3/uL (ref 0.1–1.0)
MONOS PCT: 21 % — AB (ref 3–12)
NEUTROS ABS: 1.8 10*3/uL (ref 1.7–7.7)
Neutrophils Relative %: 39 % — ABNORMAL LOW (ref 43–77)
Platelets: 278 10*3/uL (ref 150–400)
RBC: 3.24 MIL/uL — AB (ref 3.87–5.11)
RDW: 12.9 % (ref 11.5–15.5)
WBC: 4.7 10*3/uL (ref 4.0–10.5)

## 2015-08-07 LAB — BASIC METABOLIC PANEL
Anion gap: 8 (ref 5–15)
BUN: 11 mg/dL (ref 6–20)
CALCIUM: 9.4 mg/dL (ref 8.9–10.3)
CHLORIDE: 99 mmol/L — AB (ref 101–111)
CO2: 29 mmol/L (ref 22–32)
Creatinine, Ser: 0.89 mg/dL (ref 0.44–1.00)
GFR calc Af Amer: >60 mL/min (ref >60)
GFR calc non Af Amer: >60 mL/min (ref >60)
Glucose, Bld: 99 mg/dL (ref 65–99)
Potassium: 4.1 mmol/L (ref 3.5–5.1)
SODIUM: 136 mmol/L (ref 135–145)

## 2015-08-07 LAB — OCCULT BLOOD X 1 CARD TO LAB, STOOL: Fecal Occult Bld: POSITIVE — AB

## 2015-08-07 LAB — POCT HEMOGLOBIN: HEMOGLOBIN: 9.2 g/dL — AB (ref 12.2–16.2)

## 2015-08-07 MED ORDER — SODIUM CHLORIDE 0.9 % IV BOLUS (SEPSIS)
1000.0000 mL | Freq: Once | INTRAVENOUS | Status: AC
  Administered 2015-08-07: 1000 mL via INTRAVENOUS

## 2015-08-07 NOTE — ED Notes (Addendum)
Pt sent from PCP office states she needs IVF and possible blood-pt states she has been having dizzy spells x 1 week-pt is pale-NAD-came with paper work that reads Hgb 9.2

## 2015-08-07 NOTE — Discharge Instructions (Signed)
Your red blood cell count was 10.7 today. The earlier lab taken at your doctor's office is less accurate and was not a correct value. Your blood pressure was low and has improved with IV fluids. Your stool did have some blood in it and you will need to see your stomach doctor about that. Please take precautions at home to avoid falls. Use a walker or cane if you are unsteady. Follow up with your PCP.   Hypotension As your heart beats, it forces blood through your arteries. This force is your blood pressure. If your blood pressure is too low for you to go about your normal activities or to support the organs of your body, you have hypotension. Hypotension is also referred to as low blood pressure. When your blood pressure becomes too low, you may not get enough blood to your brain. As a result, you may feel weak, feel lightheaded, or develop a rapid heart rate. In a more severe case, you may faint. CAUSES Various conditions can cause hypotension. These include:  Blood loss.  Dehydration.  Heart or endocrine problems.  Pregnancy.  Severe infection.  Not having a well-balanced diet filled with needed nutrients.  Severe allergic reactions (anaphylaxis). Some medicines, such as blood pressure medicine or water pills (diuretics), may lower your blood pressure below normal. Sometimes taking too much medicine or taking medicine not as directed can cause hypotension. TREATMENT  Hospitalization is sometimes required for hypotension if fluid or blood replacement is needed, if time is needed for medicines to wear off, or if further monitoring is needed. Treatment might include changing your diet, changing your medicines (including medicines aimed at raising your blood pressure), and use of support stockings. HOME CARE INSTRUCTIONS   Drink enough fluids to keep your urine clear or pale yellow.  Take your medicines as directed by your health care provider.  Get up slowly from reclining or sitting  positions. This gives your blood pressure a chance to adjust.  Wear support stockings as directed by your health care provider.  Maintain a healthy diet by including nutritious food, such as fruits, vegetables, nuts, whole grains, and lean meats. SEEK MEDICAL CARE IF:  You have vomiting or diarrhea.  You have a fever for more than 2-3 days.  You feel more thirsty than usual.  You feel weak and tired. SEEK IMMEDIATE MEDICAL CARE IF:   You have chest pain or a fast or irregular heartbeat.  You have a loss of feeling in some part of your body, or you lose movement in your arms or legs.  You have trouble speaking.  You become sweaty or feel lightheaded.  You faint. MAKE SURE YOU:   Understand these instructions.  Will watch your condition.  Will get help right away if you are not doing well or get worse. Document Released: 12/14/2005 Document Revised: 10/04/2013 Document Reviewed: 06/16/2013 [facility] Patient Information 2015 [place], [place]. This information is not intended to replace advice given to you by your health care provider. Make sure you discuss any questions you have with your health care provider.  Near-Syncope Near-syncope (commonly known as near fainting) is sudden weakness, dizziness, or feeling like you might pass out. During an episode of near-syncope, you may also develop pale skin, have tunnel vision, or feel sick to your stomach (nauseous). Near-syncope may occur when getting up after sitting or while standing for a long time. It is caused by a sudden decrease in blood flow to the brain. This decrease can result from  various causes or triggers, most of which are not serious. However, because near-syncope can sometimes be a sign of something serious, a medical evaluation is required. The specific cause is often not determined. HOME CARE INSTRUCTIONS  Monitor your condition for any changes. The following actions may help to alleviate any discomfort you are  experiencing:  Have someone stay with you until you feel stable.  Lie down right away and prop your feet up if you start feeling like you might faint. Breathe deeply and steadily. Wait until all the symptoms have passed. Most of these episodes last only a few minutes. You may feel tired for several hours.   Drink enough fluids to keep your urine clear or pale yellow.   If you are taking blood pressure or heart medicine, get up slowly when seated or lying down. Take several minutes to sit and then stand. This can reduce dizziness.  Follow up with your health care provider as directed. SEEK IMMEDIATE MEDICAL CARE IF:   You have a severe headache.   You have unusual pain in the chest, abdomen, or back.   You are bleeding from the mouth or rectum, or you have black or tarry stool.   You have an irregular or very fast heartbeat.   You have repeated fainting or have seizure-like jerking during an episode.   You faint when sitting or lying down.   You have confusion.   You have difficulty walking.   You have severe weakness.   You have vision problems.  MAKE SURE YOU:   Understand these instructions.  Will watch your condition.  Will get help right away if you are not doing well or get worse. Document Released: 12/14/2005 Document Revised: 12/19/2013 Document Reviewed: 05/19/2013 [facility] Patient Information 2015 [place], [place]. This information is not intended to replace advice given to you by your health care provider. Make sure you discuss any questions you have with your health care provider.

## 2015-08-07 NOTE — ED Provider Notes (Signed)
Patient seen/examined in the Emergency Department in conjunction with Midlevel Provider  Patient reports dizziness and dehydration Exam : awake/alert, no arm drift, no abd tenderness Plan: vitals improved with IV fluids Pt does not require blood transfusion    Ripley Fraise, MD 08/07/15 1458

## 2015-08-07 NOTE — Progress Notes (Signed)
OFFICE VISIT  08/07/2015   CC:  Chief Complaint  Patient presents with  . Dizziness    x 11 days   HPI:    Patient is a 69 y.o. Caucasian female with lymphocytic colitis and normocytic anemia who presents for about 10d of episodes in which she feels her vision begin to go dark and she feels like she may pass out, and this is followed by a sensation that the room is spinning.  These are not posturally triggered and don't occur as a result of change in head/trunk position.  She does not pass out. Still having frequent watery stools per day: about 7-8 per day on average.  No bloody BMs.  She says the amount of liquid that comes out is large.  BMs come regardless of whether she eats or not.  Says she took cholestyramine for a couple weeks and it didn't help any.  This was a med trial after failing oral budesonide therapy.  No vomiting.  Appetite is poor.  Eating causes abd cramping and rapid loose BM.  She drinks large amounts of water throughout her day.   Past Medical History  Diagnosis Date  . ALLERGIC RHINITIS 06/02/2007  . DEPRESSION 06/02/2007  . INSOMNIA 06/02/2007  . Chronic low back pain 02/18/2009    s/p fusion L3-4 through L5-S1.  Pain meds per Dr. Nelva Bush  . OSTEOPENIA 02/18/2009    Repeat DEXA 07/2014 showed osteoporosis by T score in radius but spine and hip T scores were normal-continue vit D and calcium and repeat DEXA in 2 yrs  . Arthritis     DDD of cervical, thoracic, and lumbar spine  . HYPOTHYROIDISM 06/02/2007    GOITER  . Peripheral edema     LE's, nonpitting  . Nasal septal perforation     chronic; hx of epistaxis  . Cervical spondylosis     C5-6 and C6-7.  Marland Kitchen Allergy   . Blood transfusion without reported diagnosis   . Cataract     Bilateral removed cateracts  . GERD (gastroesophageal reflux disease)   . Heart murmur   . Muscle spasms of lower extremity   . Hiatal hernia   . Lymphocytic colitis   . Normocytic anemia 04/2015    Past Surgical History  Procedure  Laterality Date  . Abdominal hysterectomy      for DUB (ovaries are still in)  . Cataract extraction Bilateral   . Cholecystectomy  2012  . Shoulder surgery Right     \  . Back surgery      X 5: fusion of L3-L4 through L5-S1  . Total hip arthroplasty  05/31/2012    Procedure: TOTAL HIP ARTHROPLASTY ANTERIOR APPROACH;  Surgeon: Mauri Pole, MD;  Location: [facility];  Service: Orthopedics;  Laterality: Left;  . Esophagogastroduodenoscopy  10/2007    Small hiatal hernia, o/w normal.  . Colonoscopy  06/2007; 01/2015    2008 Diverticulosis, o/w normal.  2016 showed lymphocytic colitis with mild diverticulosis: pt was started on oral budesonide at that time.    Outpatient Prescriptions Prior to Visit  Medication Sig Dispense Refill  . alprazolam (XANAX) 2 MG tablet Take one-half to one tablet by mouth twice daily as needed 180 tablet 1  . dicyclomine (BENTYL) 10 MG capsule TAKE ONE CAPSULE BY MOUTH THREE OR FOUR TIMES DAILY AS NEEDED FOR CRAMPING/PAIN 270 capsule 2  . diphenoxylate-atropine (LOMOTIL) 2.5-0.025 MG per tablet Take 2 tablets by mouth 4 (four) times daily as needed for diarrhea or  loose stools.    . ferrous sulfate 325 (65 FE) MG tablet Take 325 mg by mouth 2 (two) times daily with a meal.    . ibuprofen (ADVIL,MOTRIN) 600 MG tablet Take 600 mg by mouth every 8 (eight) hours as needed for moderate pain. Post surgical    . levothyroxine (SYNTHROID, LEVOTHROID) 75 MCG tablet TAKE ONE TABLET BY MOUTH ONE TIME DAILY WITH BREAKFAST 90 tablet 3  . methocarbamol (ROBAXIN) 500 MG tablet Take 500 mg by mouth 4 (four) times daily.    Marland Kitchen oxyCODONE-acetaminophen (PERCOCET) 10-325 MG per tablet Take 1 tablet by mouth every 4 (four) hours as needed for pain.    Marland Kitchen sertraline (ZOLOFT) 100 MG tablet TAKE ONE AND ONE-HALF TABLET BY MOUTH NIGHTLY AT BEDTIME 135 tablet 3  . traZODone (DESYREL) 50 MG tablet 4 tabs po qhs prn insomnia 360 tablet 3  . budesonide (ENTOCORT EC) 3 MG 24 hr capsule TAKE THREE  CAPSULES BY MOUTH DAILY (Patient not taking: Reported on 08/07/2015) 90 capsule 0  . cholestyramine (QUESTRAN) 4 G packet Take 1 packet (4 g total) by mouth 3 (three) times daily with meals. (Patient not taking: Reported on 08/07/2015) 90 each 12   No facility-administered medications prior to visit.    No Known Allergies  ROS As per HPI  PE: Blood pressure 89/69, pulse 70, temperature 97.6 F (36.4 C), temperature source Oral, resp. rate 16, height 5\' 4"  (1.626 m), weight 127 lb (57.607 kg), SpO2 98 %. Gen: Alert, well appearing.  Patient is oriented to person, place, time, and situation. VEL:FYBO: no injection, icteris, swelling, or exudate.  EOMI, PERRLA. Mouth: lips without lesion/swelling.  Oral mucosa pink and moist. Oropharynx without erythema, exudate, or swelling.  CV: RRR, no m/r/g.   LUNGS: CTA bilat, nonlabored resps, good aeration in all lung fields.   LABS:  Lab Results  Component Value Date   TSH 0.88 05/07/2015   Lab Results  Component Value Date   WBC 4.8 07/17/2015   HGB 9.2* 08/07/2015   HCT 30.9* 07/17/2015   MCV 96.2 07/17/2015   PLT 221.0 07/17/2015   Lab Results  Component Value Date   CREATININE 0.80 05/23/2015   BUN 10 05/23/2015   NA 135 05/23/2015   K 4.3 05/23/2015   CL 103 05/23/2015   CO2 28 05/23/2015   Lab Results  Component Value Date   ALT 10 05/07/2015   AST 14 05/07/2015   ALKPHOS 67 05/07/2015   BILITOT 0.3 05/07/2015   Lab Results  Component Value Date   IRON 123 05/23/2015   FERRITIN 71.8 05/23/2015   Lab Results  Component Value Date   LABPROT 11.6 03/22/2014   Lab Results  Component Value Date   OCCULTBLD Negative 06/06/2015   POCT Hb today was 9.2.  IMPRESSION AND PLAN:  Chronic diarrhea from chronic lymphocytic colitis, has signs of dehydration (bp low, fatigue, dizzy spells). Her anemia is likely playing a role in the way she currently feels as well.  Working dx for her anemia at this time is anemia of  chronic disease. However, needs hemoccults x 3 (given today) and needs recheck of CBC today.  If IVF at the ED doesn't make her feel much improved AND if Hb on venous blood today matches the fingerstick Hb we did in office, then I will go ahead and transfuse her 2 U p RBCs as an outpatient. She's currently taking iron but past measurements of iron have been normal.  Check CBC and  CMET today. Disposition: I recommended pt go to the ED to get IVF.  We'll arrange for her to get back in with Dr. Fuller Plan at [facility] to see if there is any remaining treatments to offer her for her chronic lymphocytic colitis (oral budesonide and cholestyramine no help).    An After Visit Summary was printed and given to the patient.  Spent 45 min with pt today, with >50% of this time spent in counseling and care coordination regarding the above problems.   FOLLOW UP: Return in about 1 month (around 09/07/2015).

## 2015-08-07 NOTE — ED Provider Notes (Signed)
CSN: [id number]     Arrival date & time 08/07/15  1144 History   First MD Initiated Contact with Patient 08/07/15 1257     Chief Complaint  Patient presents with  . Abnormal Lab     (Consider location/radiation/quality/duration/timing/severity/associated sxs/prior Treatment) HPI April Brewer This is a 69 year old female sent over by her primary care physician. She is a past medical history of chronic diarrhea and lymphocytic colitis, chronic normocytic anemia, anorexia, osteopenia, and history of previous blood transfusions. Patient presented to her PCPs office with complaint of 10 days of feelings of presyncope. She states that this only occurs when she is sitting or standing, but does not occur when she is lying flat. She denies actual loss of consciousness. She continues to have frequent watery stools, 7-8 per day. This is not a change for her, as has been going on for very long time. She denies melena or hematochezia. She is under the care of a gastroenterologist. She has a poor appetite because of abdominal cramping and pain. She states she is drinking a lot of fluids. At her PCPs office. She was found to have a 1 g drop in her hemoglobin over the past 3 weeks. This was done as a point of care test. Patient denies racing or skipping in her heart. She has no history of aortic stenosis. She denies other forms of volume loss such as vomiting. She denies fever or other signs of systemic infection  Past Medical History  Diagnosis Date  . ALLERGIC RHINITIS 06/02/2007  . DEPRESSION 06/02/2007  . INSOMNIA 06/02/2007  . Chronic low back pain 02/18/2009    s/p fusion L3-4 through L5-S1.  Pain meds per Dr. Nelva Bush  . OSTEOPENIA 02/18/2009    Repeat DEXA 07/2014 showed osteoporosis by T score in radius but spine and hip T scores were normal-continue vit D and calcium and repeat DEXA in 2 yrs  . Arthritis     DDD of cervical, thoracic, and lumbar spine  . HYPOTHYROIDISM 06/02/2007    GOITER  . Peripheral  edema     LE's, nonpitting  . Nasal septal perforation     chronic; hx of epistaxis  . Cervical spondylosis     C5-6 and C6-7.  Marland Kitchen Allergy   . Blood transfusion without reported diagnosis   . Cataract     Bilateral removed cateracts  . GERD (gastroesophageal reflux disease)   . Heart murmur   . Muscle spasms of lower extremity   . Hiatal hernia   . Lymphocytic colitis   . Normocytic anemia 04/2015   Past Surgical History  Procedure Laterality Date  . Abdominal hysterectomy      for DUB (ovaries are still in)  . Cataract extraction Bilateral   . Cholecystectomy  2012  . Shoulder surgery Right     \  . Back surgery      X 5: fusion of L3-L4 through L5-S1  . Total hip arthroplasty  05/31/2012    Procedure: TOTAL HIP ARTHROPLASTY ANTERIOR APPROACH;  Surgeon: Mauri Pole, MD;  Location: [facility];  Service: Orthopedics;  Laterality: Left;  . Esophagogastroduodenoscopy  10/2007    Small hiatal hernia, o/w normal.  . Colonoscopy  06/2007; 01/2015    2008 Diverticulosis, o/w normal.  2016 showed lymphocytic colitis with mild diverticulosis: pt was started on oral budesonide at that time.   Family History  Problem Relation Age of Onset  . Arthritis Mother   . Stroke Mother   . Breast cancer  Mother   . Diabetes Mother   . Colon cancer Father   . Heart disease Father   . Heart disease Sister   . Breast cancer Sister   . Lung cancer Brother     smoked  . Esophageal cancer Neg Hx   . Rectal cancer Neg Hx   . Stomach cancer Neg Hx    Social History  Substance Use Topics  . Smoking status: Never Smoker   . Smokeless tobacco: Never Used  . Alcohol Use: No   OB History    No data available     Review of Systems Ten systems reviewed and are negative for acute change, except as noted in the HPI.     Allergies  Review of patient's allergies indicates no known allergies.  Home Medications   Prior to Admission medications   Medication Sig Start Date End Date Taking?  Authorizing Provider  alprazolam Duanne Moron) 2 MG tablet Take one-half to one tablet by mouth twice daily as needed 06/28/15   Tammi Sou, MD  dicyclomine (BENTYL) 10 MG capsule TAKE ONE CAPSULE BY MOUTH THREE OR FOUR TIMES DAILY AS NEEDED FOR CRAMPING/PAIN 06/28/15   Ladene Artist, MD  diphenoxylate-atropine (LOMOTIL) 2.5-0.025 MG per tablet Take 2 tablets by mouth 4 (four) times daily as needed for diarrhea or loose stools.    Historical Provider, MD  ferrous sulfate 325 (65 FE) MG tablet Take 325 mg by mouth 2 (two) times daily with a meal.    Historical Provider, MD  ibuprofen (ADVIL,MOTRIN) 600 MG tablet Take 600 mg by mouth every 8 (eight) hours as needed for moderate pain. Post surgical 06/11/12   Historical Provider, MD  levothyroxine (SYNTHROID, LEVOTHROID) 75 MCG tablet TAKE ONE TABLET BY MOUTH ONE TIME DAILY WITH BREAKFAST 06/28/15   Tammi Sou, MD  methocarbamol (ROBAXIN) 500 MG tablet Take 500 mg by mouth 4 (four) times daily.    Historical Provider, MD  oxyCODONE-acetaminophen (PERCOCET) 10-325 MG per tablet Take 1 tablet by mouth every 4 (four) hours as needed for pain.    Historical Provider, MD  sertraline (ZOLOFT) 100 MG tablet TAKE ONE AND ONE-HALF TABLET BY MOUTH NIGHTLY AT BEDTIME 06/28/15   Tammi Sou, MD  traZODone (DESYREL) 50 MG tablet 4 tabs po qhs prn insomnia 07/08/15   Tammi Sou, MD   BP 102/58 mmHg  Pulse 67  Temp(Src) 98.1 F (36.7 C) (Oral)  Resp 16  Ht 5\' 4"  (1.626 m)  Wt 127 lb (57.607 kg)  BMI 21.79 kg/m2  SpO2 98% Physical Exam  Constitutional: She is oriented to person, place, and time. She appears well-developed and well-nourished. No distress.  HENT:  Head: Normocephalic and atraumatic.  No oral mucosal pallor  Eyes: Conjunctivae are normal. No scleral icterus.  No conjunctival pallor  Neck: Normal range of motion.  Cardiovascular: Normal rate, regular rhythm and normal heart sounds.  Exam reveals no gallop and no friction rub.   No  murmur heard. Pulmonary/Chest: Effort normal and breath sounds normal. No respiratory distress.  Abdominal: Soft. Bowel sounds are normal. She exhibits no distension and no mass. There is no tenderness. There is no guarding.  Neurological: She is alert and oriented to person, place, and time.  Skin: Skin is warm and dry. She is not diaphoretic.    ED Course  Procedures (including critical care time) Labs Review Labs Reviewed  BASIC METABOLIC PANEL  CBC WITH DIFFERENTIAL/PLATELET  POC OCCULT BLOOD, ED    Imaging  Review No results found.   EKG Interpretation   Date/Time:  Wednesday August 07 2015 12:13:31 EDT Ventricular Rate:  69 PR Interval:  188 QRS Duration: 86 QT Interval:  398 QTC Calculation: 426 R Axis:   56 Text Interpretation:  Normal sinus rhythm Normal ECG No significant change  since last tracing Confirmed by Christy Gentles  MD, Elenore Rota ([id number]) on 08/07/2015  12:26:04 PM      MDM   Final diagnoses:  None    Patient comes in from PCP. She had a systolic pressure of 89 in her pcp office.  Will repeat cbc instead of POCT hgb Fluids, hemoccult. And bmp   3:02 PM Filed Vitals:   08/07/15 1156 08/07/15 1219 08/07/15 1330 08/07/15 1454  BP: 103/77 102/58 109/66 126/62  Pulse: 83 67 66 61  Temp: 98.1 F (36.7 C)     TempSrc: Oral     Resp: 18 16  18   Height: 5\' 4"  (1.626 m)     Weight: 127 lb (57.607 kg)     SpO2: 100% 98% 99% 99%   Patient increased after fluids, her digital rectal exam revealed normal. Stool color. No active rectal bleeding or melena noted. Her occult stool is positive. Repeat CBC shows stable hemoglobin. Normal orthostatic vital signs. Plan is to ambulate.  Patient ambulating easily in the emergency department. Discussed findings of today's workup. She appears safe for discharge. Follow up with PCP and gastroenterology. Avoid falls at home by taking precautions.  Margarita Mail, PA-C 08/07/15 1542  Ripley Fraise, MD 08/07/15 1600

## 2015-08-07 NOTE — Patient Instructions (Signed)
Go to April Brewer

## 2015-08-07 NOTE — Progress Notes (Signed)
Pre visit review using our clinic review tool, if applicable. No additional management support is needed unless otherwise documented below in the visit note. 

## 2015-08-08 ENCOUNTER — Encounter: Payer: Self-pay | Admitting: Family Medicine

## 2015-08-09 ENCOUNTER — Other Ambulatory Visit: Payer: Medicare Other

## 2015-08-09 ENCOUNTER — Other Ambulatory Visit: Payer: Self-pay | Admitting: Family Medicine

## 2015-08-09 DIAGNOSIS — R630 Anorexia: Secondary | ICD-10-CM

## 2015-08-09 DIAGNOSIS — D649 Anemia, unspecified: Secondary | ICD-10-CM

## 2015-08-09 DIAGNOSIS — E871 Hypo-osmolality and hyponatremia: Secondary | ICD-10-CM

## 2015-08-09 LAB — HEMOCCULT SLIDES (X 3 CARDS)
Fecal Occult Blood: POSITIVE — AB
OCCULT 1: POSITIVE — AB
OCCULT 2: POSITIVE — AB
OCCULT 3: POSITIVE — AB
OCCULT 4: POSITIVE — AB
OCCULT 5: POSITIVE — AB

## 2015-08-09 MED ORDER — OMEPRAZOLE 40 MG PO CPDR: 40.0000 mg | DELAYED_RELEASE_CAPSULE | Freq: Every day | ORAL | Status: DC

## 2015-08-15 ENCOUNTER — Other Ambulatory Visit (INDEPENDENT_AMBULATORY_CARE_PROVIDER_SITE_OTHER): Payer: Medicare Other

## 2015-08-15 DIAGNOSIS — D509 Iron deficiency anemia, unspecified: Secondary | ICD-10-CM

## 2015-08-15 LAB — CBC WITH DIFFERENTIAL/PLATELET
BASOS ABS: 0 10*3/uL (ref 0.0–0.1)
Basophils Relative: 0.6 % (ref 0.0–3.0)
EOS PCT: 2.8 % (ref 0.0–5.0)
Eosinophils Absolute: 0.1 10*3/uL (ref 0.0–0.7)
HEMATOCRIT: 32.4 % — AB (ref 36.0–46.0)
HEMOGLOBIN: 10.9 g/dL — AB (ref 12.0–15.0)
LYMPHS PCT: 27.2 % (ref 12.0–46.0)
Lymphs Abs: 1.4 10*3/uL (ref 0.7–4.0)
MCHC: 33.6 g/dL (ref 30.0–36.0)
MCV: 96.7 fl (ref 78.0–100.0)
MONOS PCT: 14.6 % — AB (ref 3.0–12.0)
Monocytes Absolute: 0.7 10*3/uL (ref 0.1–1.0)
NEUTROS PCT: 54.8 % (ref 43.0–77.0)
Neutro Abs: 2.8 10*3/uL (ref 1.4–7.7)
Platelets: 254 10*3/uL (ref 150.0–400.0)
RBC: 3.35 Mil/uL — AB (ref 3.87–5.11)
RDW: 13.4 % (ref 11.5–15.5)
WBC: 5.1 10*3/uL (ref 4.0–10.5)

## 2015-08-29 ENCOUNTER — Ambulatory Visit (INDEPENDENT_AMBULATORY_CARE_PROVIDER_SITE_OTHER): Payer: Medicare Other | Admitting: Physician Assistant

## 2015-08-29 ENCOUNTER — Encounter: Payer: Self-pay | Admitting: Physician Assistant

## 2015-08-29 VITALS — BP 106/60 | HR 64

## 2015-08-29 DIAGNOSIS — D509 Iron deficiency anemia, unspecified: Secondary | ICD-10-CM

## 2015-08-29 DIAGNOSIS — R197 Diarrhea, unspecified: Secondary | ICD-10-CM

## 2015-08-29 DIAGNOSIS — K52832 Lymphocytic colitis: Secondary | ICD-10-CM

## 2015-08-29 DIAGNOSIS — K5289 Other specified noninfective gastroenteritis and colitis: Secondary | ICD-10-CM | POA: Diagnosis not present

## 2015-08-29 MED ORDER — MESALAMINE 1.2 G PO TBEC: DELAYED_RELEASE_TABLET | ORAL | Status: DC

## 2015-08-29 MED ORDER — PREDNISONE 20 MG PO TABS: 20.0000 mg | ORAL_TABLET | Freq: Every day | ORAL | Status: DC

## 2015-08-29 NOTE — Patient Instructions (Addendum)
No anti-inflammatory  Medications, ( Advil, Motrin, Aleve, Ibuprofen). Tylenol or Tylenol extra strength is fine.  Start Lialda, 2 tablets by mouth twice daily. Start Prednisone 20 mg tablet by mouth every morning. Prescriptions sent to [facility], [address], [place].

## 2015-08-29 NOTE — Progress Notes (Signed)
Reviewed and agree with management plan.  Nikeria Kalman T. Sivan Quast, MD FACG 

## 2015-08-29 NOTE — Progress Notes (Addendum)
Patient ID: April Brewer, female   DOB: 04/29/1946, 69 y.o.   MRN: [id number]   Subjective:    Patient ID: April Brewer, female    DOB: February 19, 1946, 69 y.o.   MRN: [id number]  HPI Dilated is a pleasant 69 year old white female known to Dr. Fuller Plan. She was diagnosed with lymphocytic colitis earlier this year at the time of colonoscopy. This was done in February 2016 for complaints of diarrhea and she was found to have abnormal appearing mucosa of the entire colon mild diverticulosis. Biopsies were positive for a lymphocytic colitis. She was initially started on a course of Entocort 9 mg by mouth daily. She was seen back in the office in March and at that time it stated she was feeling better. She was to continue the Entocort and follow up in the office in 4-6 weeks. She was also given Bentyl which she feels does help her cramping. She called back at 1. stating the Entocort did not seem to be helping and was called and Questran which she did not find beneficial. She has not been taking Questran or Entocort over the past 6 months. She actually had an ER visit in August with weakness and diarrhea she was given IV fluids stool was Hemoccult positive hemoglobin at that time was 10.9 hematocrit of 32.4 MCV of 96.7. She has been on oral iron. No stool cultures were done. She was asked to follow up with GI. In She says that these are her same symptoms that she has had all along and that she didn't feel like the any of the medicine made much difference. She does see some bright red blood in her stool off and on has ongoing problems with lower abdominal cramping and discomfort. She has at least 6-8 bowel movements every day which are loose to liquid. Occasionally has diarrhea at nighttime though not frequently. She's not a great historian but feels that she has lost over 20 pounds since the beginning of the year. She has not been on any recent antibiotics.  Review of Systems Pertinent positive and negative review  of systems were noted in the above HPI section.  All other review of systems was otherwise negative.  Outpatient Encounter Prescriptions as of 08/29/2015  Medication Sig  . alprazolam (XANAX) 2 MG tablet Take one-half to one tablet by mouth twice daily as needed  . dicyclomine (BENTYL) 10 MG capsule TAKE ONE CAPSULE BY MOUTH THREE OR FOUR TIMES DAILY AS NEEDED FOR CRAMPING/PAIN  . ferrous sulfate 325 (65 FE) MG tablet Take 325 mg by mouth 2 (two) times daily with a meal.  . ibuprofen (ADVIL,MOTRIN) 600 MG tablet Take 600 mg by mouth every 8 (eight) hours as needed for moderate pain. Post surgical  . levothyroxine (SYNTHROID, LEVOTHROID) 75 MCG tablet TAKE ONE TABLET BY MOUTH ONE TIME DAILY WITH BREAKFAST  . methocarbamol (ROBAXIN) 500 MG tablet Take 500 mg by mouth 4 (four) times daily.  Marland Kitchen oxyCODONE-acetaminophen (PERCOCET) 10-325 MG per tablet Take 1 tablet by mouth every 4 (four) hours as needed for pain.  Marland Kitchen sertraline (ZOLOFT) 100 MG tablet TAKE ONE AND ONE-HALF TABLET BY MOUTH NIGHTLY AT BEDTIME  . traZODone (DESYREL) 50 MG tablet 4 tabs po qhs prn insomnia  . mesalamine (LIALDA) 1.2 G EC tablet Take 2 tablets twice daily  . predniSONE (DELTASONE) 20 MG tablet Take 1 tablet (20 mg total) by mouth daily with breakfast.  . [DISCONTINUED] diphenoxylate-atropine (LOMOTIL) 2.5-0.025 MG per tablet Take 2 tablets by mouth  4 (four) times daily as needed for diarrhea or loose stools.  . [DISCONTINUED] omeprazole (PRILOSEC) 40 MG capsule Take 1 capsule (40 mg total) by mouth daily.   No facility-administered encounter medications on file as of 08/29/2015.   No Known Allergies Patient Active Problem List   Diagnosis Date Noted  . Lymphocytic colitis 03/14/2015  . Dyspnea 01/11/2014  . Venous insufficiency 08/23/2013  . Peripheral edema 08/23/2013  . Anxiety and depression 08/23/2013  . Hyponatremia 05/19/2013  . Cough, persistent 08/31/2012  . 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 OSTEOPENIA 02/18/2009    . GOITER 06/02/2007  . HYPOTHYROIDISM 06/02/2007  . DEPRESSION 06/02/2007  . HYPERTENSION 06/02/2007  . ALLERGIC RHINITIS 06/02/2007  . GERD 06/02/2007  . INSOMNIA 06/02/2007   Social History   Social History  . Marital Status: Married    Spouse Name: N/A  . Number of Children: N/A  . Years of Education: N/A   Occupational History  . retired    Social History Main Topics  . Smoking status: Never Smoker   . Smokeless tobacco: Never Used  . Alcohol Use: No  . Drug Use: No  . Sexual Activity: Not on file   Other Topics Concern  . Not on file   Social History Narrative   Lives with her husband in [place], has two sons.   Born and raised [place].   Retired Programmer, multimedia related to back pain problems.   No T/A/Ds.             Ms. Foutz family history includes Arthritis in her mother; Breast cancer in her mother and sister; Colon cancer in her father; Diabetes in her mother; Heart disease in her father and sister; Lung cancer in her brother; Stroke in her mother; Uterine cancer in her sister. There is no history of Esophageal cancer, Rectal cancer, or Stomach cancer.      Objective:    Filed Vitals:   08/29/15 1013  BP: 106/60  Pulse: 64    Physical Exam  well-developed older white female in no acute distress, accompanied by her son blood pressure 106/60 pulse 64. HEENT ;nontraumatic normocephalic EOMI PERRLA sclera anicteric, Cardiovascular; regular rate and rhythm with S1-S2 no murmur or gallop, Pulmonary ;clear bilaterally, Abdomen ;soft some mild bilateral lower quadrant tenderness there is no guarding or rebound no palpable mass or hepatosplenomegaly bowel sounds are present, Rectal ;exam not done, Extremities; no clubbing cyanosis or edema skin warm and dry, Neuropsych ;mood and affect appropriate       Assessment & Plan:   #1 69 yo female with lymphocytic colitis dx 01/2015 - persistent symptoms  With diarrhea, abdominal cramping and  intermittent hematochezia,and pt states questran and Entocort of no benefit.  Plan; Continue oral iron rx  start Lialda 2.4 gm po bid Start prednisone 20 mg po qam  will follow up in office in 3-4 weeks , hope to start tapering prednisone at that time, consider Ulceris for maintanence of remission  pt had been using occasional ibuprofen and was asked to stop all use of nsaids- may use tylenol   Amy S Esterwood PA-C 08/29/2015   Cc: McGowen, Adrian Blackwater, MD

## 2015-09-10 ENCOUNTER — Telehealth: Payer: Self-pay | Admitting: Physician Assistant

## 2015-09-10 NOTE — Telephone Encounter (Signed)
She reports she is feeling much better. She has not had any diarrhea. The Lialda will cost $300 a month. She is on Prednisone 20 mg daily. Do you want to start a taper?

## 2015-09-11 NOTE — Telephone Encounter (Signed)
She will need a new prescription because she has 20 mg tablet. How long do you think she will be on it?

## 2015-09-11 NOTE — Telephone Encounter (Signed)
Glad she is better... So she has not started Lialda correct?Marland Kitchen...ibuprofen would like her to stay on prednisone 20 mg po daily for one more week then can decrease to 15 mg daily until I see her in office- think she already has appt scheduled

## 2015-09-12 ENCOUNTER — Other Ambulatory Visit: Payer: Self-pay

## 2015-09-12 ENCOUNTER — Ambulatory Visit: Payer: Self-pay | Admitting: Gastroenterology

## 2015-09-12 MED ORDER — PREDNISONE 10 MG PO TABS: 15.0000 mg | ORAL_TABLET | Freq: Every day | ORAL | Status: DC

## 2015-09-12 NOTE — Telephone Encounter (Signed)
Give new rx for 10 mg tablets and have her take  1.5 tablets qam  she may be on some prednisone for another month... Call in 100 tabs/0 refills.Marland Kitchen

## 2015-09-12 NOTE — Telephone Encounter (Signed)
Rx sent to the pharmacy. No answer at the patient's home.

## 2015-09-13 NOTE — Telephone Encounter (Signed)
April Brewer, she has purchased the [place]. She will not be able to sustain this due to the price.

## 2015-09-13 NOTE — Telephone Encounter (Signed)
Patient is instructed. 

## 2015-09-16 NOTE — Telephone Encounter (Signed)
Ok-  Will see how she is doing when see at follow up appt.Marland KitchenMarland Kitchen

## 2015-09-19 ENCOUNTER — Ambulatory Visit: Payer: Medicare Other | Admitting: Family Medicine

## 2015-09-26 ENCOUNTER — Ambulatory Visit: Payer: Medicare Other | Admitting: Family Medicine

## 2015-09-30 ENCOUNTER — Ambulatory Visit (INDEPENDENT_AMBULATORY_CARE_PROVIDER_SITE_OTHER): Payer: Medicare Other | Admitting: Family Medicine

## 2015-09-30 ENCOUNTER — Encounter: Payer: Self-pay | Admitting: Family Medicine

## 2015-09-30 VITALS — BP 127/81 | HR 63 | Temp 97.9°F | Resp 16 | Ht 64.0 in | Wt 133.0 lb

## 2015-09-30 DIAGNOSIS — R5382 Chronic fatigue, unspecified: Secondary | ICD-10-CM | POA: Diagnosis not present

## 2015-09-30 DIAGNOSIS — K52832 Lymphocytic colitis: Secondary | ICD-10-CM | POA: Diagnosis not present

## 2015-09-30 DIAGNOSIS — Z23 Encounter for immunization: Secondary | ICD-10-CM | POA: Diagnosis not present

## 2015-09-30 DIAGNOSIS — D649 Anemia, unspecified: Secondary | ICD-10-CM

## 2015-09-30 NOTE — Progress Notes (Signed)
OFFICE VISIT  09/30/2015   CC:  Chief Complaint  Patient presents with  . Follow-up   HPI:    Patient is a 69 y.o. Caucasian female who presents for 2 mo f/u normocytic anemia with heme+ stools, also lymphocytic colitis for which she was recently put on Lialda 2.4g bid and prednisone 20mg  qd by her GI provider.  She says GI took her off of iron for now. Not having anymore loose BMs.  Has some lower abd cramping a couple times a day and takes bentyl for this and it helps.  No BRBPR, no melena. Has GI f/u 10/21/15.  Hypothyroidism: compliant with levothyroxine--takes this separate from everything else, empty stomach.  Feels like mood/anxiety level is doing fine on sertraline.  Sleep is fine as long as she takes 4 of her trazodone.   Past Medical History  Diagnosis Date  . ALLERGIC RHINITIS 06/02/2007  . DEPRESSION 06/02/2007  . INSOMNIA 06/02/2007  . Chronic low back pain 02/18/2009    s/p fusion L3-4 through L5-S1.  Pain meds per Dr. Nelva Bush  . OSTEOPENIA 02/18/2009    Repeat DEXA 07/2014 showed osteoporosis by T score in radius but spine and hip T scores were normal-continue vit D and calcium and repeat DEXA in 2 yrs  . Arthritis     DDD of cervical, thoracic, and lumbar spine  . HYPOTHYROIDISM 06/02/2007    GOITER  . Peripheral edema     LE's, nonpitting  . Nasal septal perforation     chronic; hx of epistaxis  . Cervical spondylosis     C5-6 and C6-7.  Marland Kitchen Allergy   . Blood transfusion without reported diagnosis   . Cataract     Bilateral removed cateracts  . GERD (gastroesophageal reflux disease)   . Heart murmur   . Muscle spasms of lower extremity   . Hiatal hernia   . Lymphocytic colitis   . Normocytic anemia 04/2015    HEME + in ED 08/07/15  . Diverticulosis     Past Surgical History  Procedure Laterality Date  . Abdominal hysterectomy      for DUB (ovaries are still in)  . Cataract extraction Bilateral   . Cholecystectomy  2012  . Shoulder surgery Right     \  .  Back surgery      X 5: fusion of L3-L4 through L5-S1  . Total hip arthroplasty  05/31/2012    Procedure: TOTAL HIP ARTHROPLASTY ANTERIOR APPROACH;  Surgeon: Mauri Pole, MD;  Location: [facility];  Service: Orthopedics;  Laterality: Left;  . Esophagogastroduodenoscopy  10/2007    Small hiatal hernia, o/w normal.  . Colonoscopy  06/2007; 01/2015    2008 Diverticulosis, o/w normal.  2016 showed lymphocytic colitis with mild diverticulosis: pt was started on oral budesonide at that time.    Outpatient Prescriptions Prior to Visit  Medication Sig Dispense Refill  . alprazolam (XANAX) 2 MG tablet Take one-half to one tablet by mouth twice daily as needed 180 tablet 1  . dicyclomine (BENTYL) 10 MG capsule TAKE ONE CAPSULE BY MOUTH THREE OR FOUR TIMES DAILY AS NEEDED FOR CRAMPING/PAIN 270 capsule 2  . ibuprofen (ADVIL,MOTRIN) 600 MG tablet Take 600 mg by mouth every 8 (eight) hours as needed for moderate pain. Post surgical    . levothyroxine (SYNTHROID, LEVOTHROID) 75 MCG tablet TAKE ONE TABLET BY MOUTH ONE TIME DAILY WITH BREAKFAST 90 tablet 3  . mesalamine (LIALDA) 1.2 G EC tablet Take 2 tablets twice daily 120 tablet  3  . methocarbamol (ROBAXIN) 500 MG tablet Take 500 mg by mouth 4 (four) times daily.    Marland Kitchen oxyCODONE-acetaminophen (PERCOCET) 10-325 MG per tablet Take 1 tablet by mouth every 4 (four) hours as needed for pain.    . predniSONE (DELTASONE) 10 MG tablet Take 1.5 tablets (15 mg total) by mouth daily with breakfast. 100 tablet 0  . sertraline (ZOLOFT) 100 MG tablet TAKE ONE AND ONE-HALF TABLET BY MOUTH NIGHTLY AT BEDTIME 135 tablet 3  . traZODone (DESYREL) 50 MG tablet 4 tabs po qhs prn insomnia 360 tablet 3  . ferrous sulfate 325 (65 FE) MG tablet Take 325 mg by mouth 2 (two) times daily with a meal.     No facility-administered medications prior to visit.    No Known Allergies  ROS As per HPI  PE: Blood pressure 127/81, pulse 63, temperature 97.9 F (36.6 C), temperature source  Oral, resp. rate 16, height 5\' 4"  (1.626 m), weight 133 lb (60.328 kg), SpO2 100 %. Wt is up 6 lbs compared to last visit here on 08/07/15 Gen: Alert, well appearing.  Patient is oriented to person, place, time, and situation. GDJ:MEQA: no injection, icteris, swelling, or exudate.  EOMI, PERRLA. Mouth: lips without lesion/swelling.  Oral mucosa pink and moist. Oropharynx without erythema, exudate, or swelling.  CV: RRR, 1/6 systolic murmur heard best at RUSB, no rub or gallop. Chest is clear, no wheezing or rales. Normal symmetric air entry throughout both lung fields. No chest wall deformities or tenderness. ABD: soft, nondistended.  Mild tendereness diffusely in entire abdomen but she is smiling as she says this.  No guarding or rebound.  No HSM or bruit or mass.  BS normal. EXT: 1-2+ pitting edema in LE's (L a bit more than R).  LABS:  Lab Results  Component Value Date   WBC 5.1 08/15/2015   HGB 10.9* 08/15/2015   HCT 32.4* 08/15/2015   MCV 96.7 08/15/2015   PLT 254.0 08/15/2015   Lab Results  Component Value Date   IRON 123 05/23/2015   FERRITIN 71.8 05/23/2015   Lab Results  Component Value Date   VITAMINB12 317 05/23/2015   Lab Results  Component Value Date   TSH 0.88 05/07/2015     Chemistry      Component Value Date/Time   NA 136 08/07/2015 1315   K 4.1 08/07/2015 1315   CL 99* 08/07/2015 1315   CO2 29 08/07/2015 1315   BUN 11 08/07/2015 1315   CREATININE 0.89 08/07/2015 1315      Component Value Date/Time   CALCIUM 9.4 08/07/2015 1315   ALKPHOS 69 08/07/2015 1112   AST 15 08/07/2015 1112   ALT 12 08/07/2015 1112   BILITOT 0.3 08/07/2015 1112      IMPRESSION AND PLAN:  1) Normocytic anemia; ? Anemia of chronic dz + mild iron deficiency.   GI will advise her when to get back on iron (per pt report today). Recheck CBC today.  2) Chronic fatigue: suspect deconditioning. CMET and CBC today.  3) Hypothyroidism: The current medical regimen is effective;   continue present plan and medications. Recheck TSH after 04/2016.  4) GAD/depression: doing fine/stable on sertraline 150mg  qd.  5) Insomnia: The current medical regimen is effective;  continue present plan and medications.  6) Lymphocytic colitis: much improved on mesalamine 2.4mg  bid and prednisone. Prednisone dose has been cut from 20mg  qd to 15mg  qd. Keep GI f/u appt set for about 3 wks from now.  Continue  bentyl prn abd cramping.  7) Preventative health care: prevnar 13 and flu vaccine today.  An After Visit Summary was printed and given to the patient.  FOLLOW UP: Return in about 4 months (around 01/31/2016) for routine chronic illness f/u (30 min).

## 2015-09-30 NOTE — Progress Notes (Signed)
Pre visit review using our clinic review tool, if applicable. No additional management support is needed unless otherwise documented below in the visit note. 

## 2015-10-01 LAB — CBC
HEMATOCRIT: 33 % — AB (ref 36.0–46.0)
HEMOGLOBIN: 11 g/dL — AB (ref 12.0–15.0)
MCHC: 33.3 g/dL (ref 30.0–36.0)
MCV: 96.2 fl (ref 78.0–100.0)
Platelets: 204 10*3/uL (ref 150.0–400.0)
RBC: 3.44 Mil/uL — ABNORMAL LOW (ref 3.87–5.11)
RDW: 13.8 % (ref 11.5–15.5)
WBC: 6.4 10*3/uL (ref 4.0–10.5)

## 2015-10-01 LAB — COMPREHENSIVE METABOLIC PANEL
ALT: 13 U/L (ref 0–35)
AST: 14 U/L (ref 0–37)
Albumin: 4.1 g/dL (ref 3.5–5.2)
Alkaline Phosphatase: 65 U/L (ref 39–117)
BUN: 8 mg/dL (ref 6–23)
CO2: 27 meq/L (ref 19–32)
Calcium: 9.4 mg/dL (ref 8.4–10.5)
Chloride: 94 mEq/L — ABNORMAL LOW (ref 96–112)
Creatinine, Ser: 0.79 mg/dL (ref 0.40–1.20)
GFR: 76.39 mL/min (ref >60.00)
GLUCOSE: 113 mg/dL — AB (ref 70–99)
Potassium: 5 mEq/L (ref 3.5–5.1)
Sodium: 129 mEq/L — ABNORMAL LOW (ref 135–145)
Total Bilirubin: 0.4 mg/dL (ref 0.2–1.2)
Total Protein: 6.5 g/dL (ref 6.0–8.3)

## 2015-10-02 ENCOUNTER — Other Ambulatory Visit: Payer: Self-pay | Admitting: *Deleted

## 2015-10-02 DIAGNOSIS — E871 Hypo-osmolality and hyponatremia: Secondary | ICD-10-CM

## 2015-10-21 ENCOUNTER — Encounter: Payer: Self-pay | Admitting: Gastroenterology

## 2015-10-21 ENCOUNTER — Ambulatory Visit (INDEPENDENT_AMBULATORY_CARE_PROVIDER_SITE_OTHER): Payer: Medicare Other | Admitting: Gastroenterology

## 2015-10-21 VITALS — BP 116/70 | HR 60 | Ht 64.0 in | Wt 132.2 lb

## 2015-10-21 DIAGNOSIS — K52832 Lymphocytic colitis: Secondary | ICD-10-CM | POA: Diagnosis not present

## 2015-10-21 DIAGNOSIS — R195 Other fecal abnormalities: Secondary | ICD-10-CM | POA: Diagnosis not present

## 2015-10-21 DIAGNOSIS — D649 Anemia, unspecified: Secondary | ICD-10-CM

## 2015-10-21 MED ORDER — BALSALAZIDE DISODIUM 750 MG PO CAPS: 1500.0000 mg | ORAL_CAPSULE | Freq: Three times a day (TID) | ORAL | Status: DC

## 2015-10-21 NOTE — Patient Instructions (Signed)
Stop taking Lialda and prednisone.   We have sent the following medications to your pharmacy for you to pick up at your convenience:balsalazide.  You have been scheduled for an endoscopy. Please follow written instructions given to you at your visit today. If you use inhalers (even only as needed), please bring them with you on the day of your procedure.  Thank you for choosing me and [facility].  Pricilla Riffle. Dagoberto Ligas., MD., [facility]

## 2015-10-21 NOTE — Assessment & Plan Note (Addendum)
Continue dicyclomine 10 mg 3 times a day before meals. Change to balsalazide 1.5 g tid due to insurance coverage. DC prednisone.

## 2015-10-21 NOTE — Progress Notes (Signed)
    History of Present Illness: This is a 69 year old female with lymphocytic colitis, anemia and Hemoccult-positive stool. She is accompanied by her husband. Colonoscopy performed in February 2016 showed diverticulosis and colon biopsies showed lymphocytic colitis. Patient notes resolution of her abdominal pain and diarrhea when Bentyl was added. She states she is taking prednisone when necessary at this time for unclear reasons. She states that Lialda is $200 per month under her prescription plan and she would like to substitute medication if possible. Hemoccult-positive stool was documented in August and she has a persistent anemia, Hb=11.  Current Medications, Allergies, Past Medical History, Past Surgical History, Family History and Social History were reviewed in [employer] record.  Physical Exam: General: Well developed, well nourished, no acute distress Head: Normocephalic and atraumatic Eyes:  sclerae anicteric, EOMI Ears: Normal auditory acuity Mouth: No deformity or lesions Lungs: Clear throughout to auscultation Heart: Regular rate and rhythm; no murmurs, rubs or bruits Abdomen: Soft, non tender and non distended. No masses, hepatosplenomegaly or hernias noted. Normal Bowel sounds Musculoskeletal: Symmetrical with no gross deformities  Pulses:  Normal pulses noted Extremities: No clubbing, cyanosis, edema or deformities noted Neurological: Alert oriented x 4, grossly nonfocal Psychological:  Alert and cooperative. Normal mood and affect  Assessment and Recommendations:  1. Occult blood in stool and normocytic anemia. No source on colonoscopy. Schedule EGD for further evaluation. The risks (including bleeding, perforation, infection, missed lesions, medication reactions and possible hospitalization or surgery if complications occur), benefits, and alternatives to endoscopy with possible biopsy and possible dilation were discussed with the patient and they  consent to proceed.

## 2015-10-23 ENCOUNTER — Other Ambulatory Visit (INDEPENDENT_AMBULATORY_CARE_PROVIDER_SITE_OTHER): Payer: Medicare Other

## 2015-10-23 DIAGNOSIS — E871 Hypo-osmolality and hyponatremia: Secondary | ICD-10-CM | POA: Diagnosis not present

## 2015-10-23 LAB — BASIC METABOLIC PANEL
BUN: 7 mg/dL (ref 6–23)
CHLORIDE: 99 meq/L (ref 96–112)
CO2: 30 meq/L (ref 19–32)
Calcium: 9.5 mg/dL (ref 8.4–10.5)
Creatinine, Ser: 0.79 mg/dL (ref 0.40–1.20)
GFR: 76.37 mL/min (ref >60.00)
Glucose, Bld: 76 mg/dL (ref 70–99)
POTASSIUM: 4.4 meq/L (ref 3.5–5.1)
Sodium: 136 mEq/L (ref 135–145)

## 2015-11-15 DIAGNOSIS — M542 Cervicalgia: Secondary | ICD-10-CM | POA: Diagnosis not present

## 2015-11-15 DIAGNOSIS — M5134 Other intervertebral disc degeneration, thoracic region: Secondary | ICD-10-CM | POA: Diagnosis not present

## 2015-11-15 DIAGNOSIS — M50322 Other cervical disc degeneration at C5-C6 level: Secondary | ICD-10-CM | POA: Diagnosis not present

## 2015-11-15 DIAGNOSIS — M5136 Other intervertebral disc degeneration, lumbar region: Secondary | ICD-10-CM | POA: Diagnosis not present

## 2015-11-27 ENCOUNTER — Ambulatory Visit (AMBULATORY_SURGERY_CENTER): Payer: Medicare Other | Admitting: Gastroenterology

## 2015-11-27 ENCOUNTER — Encounter: Payer: Self-pay | Admitting: Gastroenterology

## 2015-11-27 VITALS — BP 137/77 | HR 66 | Temp 99.4°F | Resp 20 | Ht 64.0 in | Wt 132.0 lb

## 2015-11-27 DIAGNOSIS — D649 Anemia, unspecified: Secondary | ICD-10-CM

## 2015-11-27 DIAGNOSIS — R195 Other fecal abnormalities: Secondary | ICD-10-CM

## 2015-11-27 DIAGNOSIS — K219 Gastro-esophageal reflux disease without esophagitis: Secondary | ICD-10-CM

## 2015-11-27 MED ORDER — SODIUM CHLORIDE 0.9 % IV SOLN: 500.0000 mL | INTRAVENOUS | Status: DC

## 2015-11-27 NOTE — Patient Instructions (Signed)

## 2015-11-27 NOTE — Op Note (Signed)
[place]  [address]. [place], [address]   ENDOSCOPY PROCEDURE REPORT  PATIENT: April, Brewer  MR#: [id number] BIRTHDATE: November 01, 1946 , 69  yrs. old GENDER: female ENDOSCOPIST: Ladene Artist, MD, [facility] REFERRED BY:  Ricardo Jericho, M.D. PROCEDURE DATE:  11/27/2015 PROCEDURE:  EGD, diagnostic ASA CLASS:     Class II INDICATIONS:  heme positive stool, GERD and anemia. MEDICATIONS: Monitored anesthesia care and Propofol 150 mg IV TOPICAL ANESTHETIC: none DESCRIPTION OF PROCEDURE: After the risks benefits and alternatives of the procedure were thoroughly explained, informed consent was obtained.  The [id number] endoscope was introduced through the mouth and advanced to the second portion of the duodenum , Without limitations.  The instrument was slowly withdrawn as the mucosa was fully examined.    ESOPHAGUS: The mucosa of the esophagus appeared normal. STOMACH: The mucosa of the stomach appeared normal. DUODENUM: The duodenal mucosa showed no abnormalities in the bulb and 2nd part of the duodenum.  Retroflexed views revealed a 3 cm hiatal hernia.  The scope was then withdrawn from the patient and the procedure completed.  COMPLICATIONS: There were no immediate complications.  ENDOSCOPIC IMPRESSION: 1.   Small hiatal hernia 2.    EGD otherwise appeared normal  RECOMMENDATIONS: 1.  Follow-up with your primary MD as planned 2.  Antireflux measures 3.  Continue PPI daily  eSigned:  Ladene Artist, MD, [facility] 11/27/2015 2:56 PM

## 2015-11-27 NOTE — Progress Notes (Signed)
To recovery, report to Brown, RN, VSS. 

## 2015-11-28 ENCOUNTER — Other Ambulatory Visit: Payer: Self-pay | Admitting: *Deleted

## 2015-11-28 ENCOUNTER — Telehealth: Payer: Self-pay

## 2015-11-28 MED ORDER — OMEPRAZOLE 40 MG PO CPDR: DELAYED_RELEASE_CAPSULE | ORAL | Status: DC

## 2015-11-28 MED ORDER — ALPRAZOLAM 2 MG PO TABS: ORAL_TABLET | ORAL | Status: DC

## 2015-11-28 NOTE — Telephone Encounter (Signed)
Pt called request refill to [facility].   RF request for alprazolam LOV: 09/30/15 Next ov: 02/03/16 Last written: 06/28/15 #180 w/ 1RF  Please advise. Thanks.

## 2015-11-28 NOTE — Telephone Encounter (Signed)
I called [phone] and a man answered, who sounded very sleepy.  I asked to speak with April Brewer.  He said, "who?"  I replied, April Brewer.  He replied, "you have the wrong number" and I asked if I had called [phone] and he again replied, "you have the wring number".  I looked in snap shot and listed for the pt was [phone].  I was unable to call pt. maw

## 2015-11-28 NOTE — Telephone Encounter (Signed)
RF request for omeprazole LOV: 09/30/15 Next ov: 02/03/16 Last written: unknown

## 2015-11-29 NOTE — Telephone Encounter (Signed)
Rx faxed

## 2016-02-03 ENCOUNTER — Encounter: Payer: Self-pay | Admitting: Family Medicine

## 2016-02-03 ENCOUNTER — Ambulatory Visit: Payer: Medicare Other | Admitting: Family Medicine

## 2016-02-13 ENCOUNTER — Ambulatory Visit (INDEPENDENT_AMBULATORY_CARE_PROVIDER_SITE_OTHER): Payer: Medicare Other | Admitting: Family Medicine

## 2016-02-13 ENCOUNTER — Encounter: Payer: Self-pay | Admitting: Family Medicine

## 2016-02-13 VITALS — BP 114/74 | HR 71 | Temp 98.4°F | Resp 16 | Ht 64.0 in | Wt 126.5 lb

## 2016-02-13 DIAGNOSIS — F329 Major depressive disorder, single episode, unspecified: Secondary | ICD-10-CM

## 2016-02-13 DIAGNOSIS — F418 Other specified anxiety disorders: Secondary | ICD-10-CM | POA: Diagnosis not present

## 2016-02-13 DIAGNOSIS — D649 Anemia, unspecified: Secondary | ICD-10-CM

## 2016-02-13 DIAGNOSIS — F419 Anxiety disorder, unspecified: Secondary | ICD-10-CM

## 2016-02-13 DIAGNOSIS — G894 Chronic pain syndrome: Secondary | ICD-10-CM | POA: Diagnosis not present

## 2016-02-13 DIAGNOSIS — E039 Hypothyroidism, unspecified: Secondary | ICD-10-CM

## 2016-02-13 DIAGNOSIS — K52832 Lymphocytic colitis: Secondary | ICD-10-CM

## 2016-02-13 LAB — CBC WITH DIFFERENTIAL/PLATELET
BASOS PCT: 0.4 % (ref 0.0–3.0)
Basophils Absolute: 0 10*3/uL (ref 0.0–0.1)
EOS PCT: 4.7 % (ref 0.0–5.0)
Eosinophils Absolute: 0.3 10*3/uL (ref 0.0–0.7)
HCT: 32.4 % — ABNORMAL LOW (ref 36.0–46.0)
Hemoglobin: 11 g/dL — ABNORMAL LOW (ref 12.0–15.0)
LYMPHS ABS: 1.8 10*3/uL (ref 0.7–4.0)
Lymphocytes Relative: 31.3 % (ref 12.0–46.0)
MCHC: 34 g/dL (ref 30.0–36.0)
MCV: 91.8 fl (ref 78.0–100.0)
MONO ABS: 0.6 10*3/uL (ref 0.1–1.0)
Monocytes Relative: 10.3 % (ref 3.0–12.0)
NEUTROS ABS: 3.1 10*3/uL (ref 1.4–7.7)
NEUTROS PCT: 53.3 % (ref 43.0–77.0)
PLATELETS: 189 10*3/uL (ref 150.0–400.0)
RBC: 3.53 Mil/uL — ABNORMAL LOW (ref 3.87–5.11)
RDW: 14.1 % (ref 11.5–15.5)
WBC: 5.7 10*3/uL (ref 4.0–10.5)

## 2016-02-13 LAB — TSH: TSH: 0.51 u[IU]/mL (ref 0.35–4.50)

## 2016-02-13 NOTE — Progress Notes (Signed)
OFFICE VISIT  02/13/2016   CC:  Chief Complaint  Patient presents with  . Follow-up    Pt is not fasting.    HPI:    Patient is a 70 y.o. Caucasian female who presents for 4 mo f/u hypothyroidism, hx of normocytic anemia, and chronic anxiety/depression.  Doing fine regarding anxiety and mood on current meds.  Has been seeing Dr. Nelva Bush for chronic pain mgmt, most of her pain is in her neck down to her waistline in her back. No procedures being done lately, just rx'ing oxycodone.  Says she eventually will have to have L shoulder and R hip replaced.  Takes synthroid every morning on empty stomach.  She still sees GI, was switched to balsalazide 750 mg, 2 caps tid.  Still taking bentyl prn stomach cramps. See PMH/PSH sections below for summary of GI findings in regard to w/u for heme+ stool in the context of mild normocytic anemia.  ROS: no melena or hematochezia.  No cold/heat intolerance.  No dizziness.  Past Medical History  Diagnosis Date  . ALLERGIC RHINITIS 06/02/2007  . DEPRESSION 06/02/2007  . INSOMNIA 06/02/2007  . Chronic low back pain 02/18/2009    s/p fusion L3-4 through L5-S1.  Pain meds per Dr. Nelva Bush  . OSTEOPENIA 02/18/2009    Repeat DEXA 07/2014 showed osteoporosis by T score in radius but spine and hip T scores were normal-continue vit D and calcium and repeat DEXA in 2 yrs  . Arthritis     DDD of cervical, thoracic, and lumbar spine  . HYPOTHYROIDISM 06/02/2007    GOITER  . Peripheral edema     LE's, nonpitting  . Nasal septal perforation     chronic; hx of epistaxis  . Cervical spondylosis     C5-6 and C6-7.  Marland Kitchen Allergy   . Blood transfusion without reported diagnosis   . Cataract     Bilateral removed cateracts  . GERD (gastroesophageal reflux disease)   . Heart murmur   . Muscle spasms of lower extremity   . Hiatal hernia   . Lymphocytic colitis   . Normocytic anemia 04/2015    HEME + in ED 08/07/15  . Diverticulosis     Past Surgical History  Procedure  Laterality Date  . Abdominal hysterectomy      for DUB (ovaries are still in)  . Cataract extraction Bilateral   . Cholecystectomy  2012  . Shoulder surgery Right     \  . Back surgery      X 5: fusion of L3-L4 through L5-S1  . Total hip arthroplasty  05/31/2012    Procedure: TOTAL HIP ARTHROPLASTY ANTERIOR APPROACH;  Surgeon: Mauri Pole, MD;  Location: [facility];  Service: Orthopedics;  Laterality: Left;  . Esophagogastroduodenoscopy  10/2007; 11/27/15    Small hiatal hernia, o/w normal.  . Colonoscopy  06/2007; 01/2015    2008 Diverticulosis, o/w normal.  2016 showed lymphocytic colitis with mild diverticulosis: pt was started on oral budesonide at that time.    Outpatient Prescriptions Prior to Visit  Medication Sig Dispense Refill  . alprazolam (XANAX) 2 MG tablet Take one-half to one tablet by mouth twice daily as needed 180 tablet 1  . balsalazide (COLAZAL) 750 MG capsule Take 2 capsules (1,500 mg total) by mouth 3 (three) times daily. 180 capsule 5  . dicyclomine (BENTYL) 10 MG capsule TAKE ONE CAPSULE BY MOUTH THREE OR FOUR TIMES DAILY AS NEEDED FOR CRAMPING/PAIN 270 capsule 2  . ibuprofen (ADVIL,MOTRIN) 600 MG  tablet Take 600 mg by mouth every 8 (eight) hours as needed for moderate pain. Post surgical    . levothyroxine (SYNTHROID, LEVOTHROID) 75 MCG tablet TAKE ONE TABLET BY MOUTH ONE TIME DAILY WITH BREAKFAST 90 tablet 3  . methocarbamol (ROBAXIN) 500 MG tablet Take 500 mg by mouth 4 (four) times daily.    . Oxycodone HCl 10 MG TABS TAKE 1 TABLET BY MOUTH 3 TIMES DAILY AS NEEDED FOR PAIN  0  . sertraline (ZOLOFT) 100 MG tablet TAKE ONE AND ONE-HALF TABLET BY MOUTH NIGHTLY AT BEDTIME 135 tablet 3  . traZODone (DESYREL) 50 MG tablet 4 tabs po qhs prn insomnia 360 tablet 3  . omeprazole (PRILOSEC) 40 MG capsule TAKE 1 CAPSULE (40 MG TOTAL) BY MOUTH DAILY. (Patient not taking: Reported on 02/13/2016) 30 capsule 11   No facility-administered medications prior to visit.    No Known  Allergies  ROS As per HPI  PE: Blood pressure 114/74, pulse 71, temperature 98.4 F (36.9 C), temperature source Oral, resp. rate 16, height 5\' 4"  (1.626 m), weight 126 lb 8 oz (57.38 kg), SpO2 96 %. Gen: Alert, well appearing.  Patient is oriented to person, place, time, and situation. AFFECT: pleasant, lucid thought and speech. [id number]: no injection, icteris, swelling, or exudate.  EOMI, PERRLA. Neck - No masses or thyromegaly or limitation in range of motion Mouth: lips without lesion/swelling.  Oral mucosa pink and moist. Oropharynx without erythema, exudate, or swelling.  CV: RRR, no m/r/g.   LUNGS: CTA bilat, nonlabored resps, good aeration in all lung fields. EXT: trace pitting edema L LE, no edema on RLL.  LABS:  Lab Results  Component Value Date   TSH 0.88 05/07/2015     Chemistry      Component Value Date/Time   NA 136 10/23/2015 1143   K 4.4 10/23/2015 1143   CL 99 10/23/2015 1143   CO2 30 10/23/2015 1143   BUN 7 10/23/2015 1143   CREATININE 0.79 10/23/2015 1143      Component Value Date/Time   CALCIUM 9.5 10/23/2015 1143   ALKPHOS 65 09/30/2015 1504   AST 14 09/30/2015 1504   ALT 13 09/30/2015 1504   BILITOT 0.4 09/30/2015 1504     Lab Results  Component Value Date   WBC 6.4 09/30/2015   HGB 11.0* 09/30/2015   HCT 33.0* 09/30/2015   MCV 96.2 09/30/2015   PLT 204.0 09/30/2015   Lab Results  Component Value Date   IRON 123 05/23/2015   FERRITIN 71.8 05/23/2015    Lab Results  Component Value Date   CHOL 223* 05/23/2015   HDL 55.30 05/23/2015   LDLCALC 147* 05/23/2015   LDLDIRECT 133.3 12/16/2011   TRIG 102.0 05/23/2015   CHOLHDL 4 05/23/2015    IMPRESSION AND PLAN:  1) Normocytic anemia, hx of heme + stool in 2016: she had EGD after this and no sign of bleeding was found. Colonoscopy a few months prior to this was remarkable for mild diverticulosis and lymphocytic colitis. She has not been on any iron therapy b/c iron stores looked fine.    Recheck CBC today.  2) Hypothyroidism: TSH monitoring ordered today.  3) Chronic anxiety/depression: The current medical regimen is effective;  continue present plan and medications.  4) Chronic lymphocytic colitis: mgmt as per GI.  5) Chronic pain syndrome: mostly neck and back, but also L shoulder and R hip (osteoarthritis): management per Dr. Nelva Bush and pt's other ortho MDs.  An After Visit Summary was printed  and given to the patient.  FOLLOW UP: Return in about 6 months (around 08/12/2016) for routine chronic illness f/u (30 min).

## 2016-02-13 NOTE — Progress Notes (Signed)
Pre visit review using our clinic review tool, if applicable. No additional management support is needed unless otherwise documented below in the visit note. 

## 2016-02-14 ENCOUNTER — Encounter: Payer: Self-pay | Admitting: Family Medicine

## 2016-03-13 DIAGNOSIS — M5136 Other intervertebral disc degeneration, lumbar region: Secondary | ICD-10-CM | POA: Diagnosis not present

## 2016-03-13 DIAGNOSIS — M542 Cervicalgia: Secondary | ICD-10-CM | POA: Diagnosis not present

## 2016-03-13 DIAGNOSIS — M50322 Other cervical disc degeneration at C5-C6 level: Secondary | ICD-10-CM | POA: Diagnosis not present

## 2016-03-13 DIAGNOSIS — M5134 Other intervertebral disc degeneration, thoracic region: Secondary | ICD-10-CM | POA: Diagnosis not present

## 2016-03-19 ENCOUNTER — Other Ambulatory Visit: Payer: Self-pay | Admitting: *Deleted

## 2016-03-19 ENCOUNTER — Other Ambulatory Visit: Payer: Self-pay | Admitting: Gastroenterology

## 2016-03-19 MED ORDER — OMEPRAZOLE 40 MG PO CPDR: 40.0000 mg | DELAYED_RELEASE_CAPSULE | Freq: Every day | ORAL | Status: DC

## 2016-03-19 NOTE — Telephone Encounter (Signed)
RF request for omeprazole LOV: 02/13/16 Next ov: 08/12/16 Last written: 11/28/15 #30 w/ 1RF

## 2016-04-23 ENCOUNTER — Other Ambulatory Visit: Payer: Self-pay | Admitting: Family Medicine

## 2016-04-23 NOTE — Telephone Encounter (Signed)
LOV: 02/13/16 NOV: 08/12/16  RF request for sertraline Last written: 06/28/15 #135 w/ 3RF  RF request for levothyroxine Last written: 06/28/15 #90 w/ 3RF

## 2016-04-23 NOTE — Telephone Encounter (Signed)
RF request for trazodone LOV: 02/13/16 Next ov: 08/12/16 Last written: 06/28/15 #360 w/ 3RF  Please advise. Thanks.

## 2016-04-24 ENCOUNTER — Other Ambulatory Visit: Payer: Self-pay | Admitting: *Deleted

## 2016-04-24 MED ORDER — ALPRAZOLAM 2 MG PO TABS: ORAL_TABLET | ORAL | Status: DC

## 2016-04-24 NOTE — Telephone Encounter (Signed)
[facility] Fax: [phone]  RF request for alprazolam LOV: 02/13/16 Next ov: 08/12/16 Last written: 11/28/15 #180 w/ 1RF  Please advise. Thanks.

## 2016-04-24 NOTE — Telephone Encounter (Signed)
Rx faxed

## 2016-05-04 ENCOUNTER — Telehealth: Payer: Self-pay | Admitting: Family Medicine

## 2016-05-04 NOTE — Telephone Encounter (Signed)
Patient left VM on front desk stating she had received a letter from mail order pharmacy saying that they do not have any of her Rx's. Patient requesting CB.

## 2016-05-05 NOTE — Telephone Encounter (Signed)
Spoke to patient. Advised showing refills on file for meds filled through [facility]. Faxed refill request confirmation to [facility]. Patient was grateful.

## 2016-06-02 DIAGNOSIS — Z1231 Encounter for screening mammogram for malignant neoplasm of breast: Secondary | ICD-10-CM | POA: Diagnosis not present

## 2016-06-02 DIAGNOSIS — Z803 Family history of malignant neoplasm of breast: Secondary | ICD-10-CM | POA: Diagnosis not present

## 2016-06-02 LAB — HM MAMMOGRAPHY

## 2016-06-05 ENCOUNTER — Encounter: Payer: Self-pay | Admitting: Family Medicine

## 2016-07-13 DIAGNOSIS — M542 Cervicalgia: Secondary | ICD-10-CM | POA: Diagnosis not present

## 2016-07-13 DIAGNOSIS — G894 Chronic pain syndrome: Secondary | ICD-10-CM | POA: Diagnosis not present

## 2016-07-13 DIAGNOSIS — M5136 Other intervertebral disc degeneration, lumbar region: Secondary | ICD-10-CM | POA: Diagnosis not present

## 2016-07-13 DIAGNOSIS — M47816 Spondylosis without myelopathy or radiculopathy, lumbar region: Secondary | ICD-10-CM | POA: Diagnosis not present

## 2016-07-27 ENCOUNTER — Other Ambulatory Visit: Payer: Self-pay | Admitting: *Deleted

## 2016-07-27 MED ORDER — ALPRAZOLAM 2 MG PO TABS: ORAL_TABLET | ORAL | 1 refills | Status: DC

## 2016-07-27 NOTE — Telephone Encounter (Signed)
RF request for alprazolam LOV: 02/13/16 Next ov: 08/12/16 Last written: 04/24/16 #180 w/ 1RF  Please advise. Thanks.

## 2016-07-28 NOTE — Telephone Encounter (Signed)
Rx faxed

## 2016-08-12 ENCOUNTER — Ambulatory Visit (INDEPENDENT_AMBULATORY_CARE_PROVIDER_SITE_OTHER): Payer: Medicare Other | Admitting: Family Medicine

## 2016-08-12 ENCOUNTER — Encounter: Payer: Self-pay | Admitting: Family Medicine

## 2016-08-12 VITALS — BP 120/76 | HR 64 | Temp 98.3°F | Resp 16 | Ht 64.0 in | Wt 126.5 lb

## 2016-08-12 DIAGNOSIS — D649 Anemia, unspecified: Secondary | ICD-10-CM

## 2016-08-12 DIAGNOSIS — Z79899 Other long term (current) drug therapy: Secondary | ICD-10-CM | POA: Diagnosis not present

## 2016-08-12 DIAGNOSIS — E039 Hypothyroidism, unspecified: Secondary | ICD-10-CM | POA: Diagnosis not present

## 2016-08-12 LAB — CBC WITH DIFFERENTIAL/PLATELET
BASOS PCT: 0.4 % (ref 0.0–3.0)
Basophils Absolute: 0 10*3/uL (ref 0.0–0.1)
EOS PCT: 4.5 % (ref 0.0–5.0)
Eosinophils Absolute: 0.3 10*3/uL (ref 0.0–0.7)
HCT: 32.3 % — ABNORMAL LOW (ref 36.0–46.0)
Hemoglobin: 10.9 g/dL — ABNORMAL LOW (ref 12.0–15.0)
LYMPHS ABS: 1.8 10*3/uL (ref 0.7–4.0)
Lymphocytes Relative: 31.1 % (ref 12.0–46.0)
MCHC: 33.8 g/dL (ref 30.0–36.0)
MCV: 93 fl (ref 78.0–100.0)
MONO ABS: 0.6 10*3/uL (ref 0.1–1.0)
MONOS PCT: 10.7 % (ref 3.0–12.0)
NEUTROS ABS: 3 10*3/uL (ref 1.4–7.7)
NEUTROS PCT: 53.3 % (ref 43.0–77.0)
Platelets: 232 10*3/uL (ref 150.0–400.0)
RBC: 3.47 Mil/uL — AB (ref 3.87–5.11)
RDW: 13.8 % (ref 11.5–15.5)
WBC: 5.7 10*3/uL (ref 4.0–10.5)

## 2016-08-12 LAB — IRON AND TIBC
%SAT: 39 % (ref 11–50)
Iron: 105 ug/dL (ref 45–160)
TIBC: 272 ug/dL (ref 250–450)
UIBC: 167 ug/dL (ref 125–400)

## 2016-08-12 LAB — BASIC METABOLIC PANEL
BUN: 10 mg/dL (ref 6–23)
CHLORIDE: 99 meq/L (ref 96–112)
CO2: 31 meq/L (ref 19–32)
Calcium: 9.6 mg/dL (ref 8.4–10.5)
Creatinine, Ser: 0.76 mg/dL (ref 0.40–1.20)
GFR: 79.68 mL/min (ref >60.00)
GLUCOSE: 90 mg/dL (ref 70–99)
POTASSIUM: 4.1 meq/L (ref 3.5–5.1)
SODIUM: 135 meq/L (ref 135–145)

## 2016-08-12 LAB — FERRITIN: FERRITIN: 66.9 ng/mL (ref 10.0–291.0)

## 2016-08-12 LAB — TSH: TSH: 2.64 u[IU]/mL (ref 0.35–4.50)

## 2016-08-12 LAB — IRON: Iron: 107 ug/dL (ref 42–145)

## 2016-08-12 NOTE — Progress Notes (Signed)
Pre visit review using our clinic review tool, if applicable. No additional management support is needed unless otherwise documented below in the visit note. 

## 2016-08-12 NOTE — Progress Notes (Signed)
OFFICE VISIT  08/12/2016   CC:  Chief Complaint  Patient presents with  . Follow-up    Pt is fasting.    HPI:    Patient is a 70 y.o. Caucasian female who presents for 6 mo f/u hypothyroidism, hx of normocytic anemia, and chronic anxiety/depression.  Has been seeing GI for chronic lymphocytic colitis. She also sees Dr. Nelva Bush for chronic pain syndrome.  Most recent f/u 06/2016, has f/u planned 10/2016.  Says her anxiety and mood are well controlled on current meds: sertraline 1 and 1/2 of 100mg  tab qd, alpraz 2mg , 1/2-1 tab bid.   Trazodone helps her sleep if she takes 4.  Most nights sleep is ok, occ night of waking up really early.  Takes synthroid on empty stomach every day.  Seems to be doing fine from GI standpoint, requires dicyclomine prn for abd cramps.  Still with occ urgent loose BM. Most BMs are hard.  She says the balsalazide rx'd by GI made her constipated so she took it for only one month.   Past Medical History:  Diagnosis Date  . ALLERGIC RHINITIS 06/02/2007  . Allergy   . Arthritis    DDD of cervical, thoracic, and lumbar spine  . Blood transfusion without reported diagnosis   . Cataract    Bilateral removed cateracts  . Cervical spondylosis    C5-6 and C6-7.  Marland Kitchen Chronic low back pain 02/18/2009   s/p fusion L3-4 through L5-S1.  Pain meds per Dr. Nelva Bush  . DEPRESSION 06/02/2007  . Diverticulosis   . GERD (gastroesophageal reflux disease)   . Heart murmur   . Hiatal hernia   . HYPOTHYROIDISM 06/02/2007   GOITER  . INSOMNIA 06/02/2007  . Lymphocytic colitis   . Muscle spasms of lower extremity   . Nasal septal perforation    chronic; hx of epistaxis  . Normocytic anemia 04/2015   HEME + in ED 08/07/15--endoscopic eval unrevealing.  Iron studies fine.  ?Anemia of chronic dz (lymphocytic colitis?)  . OSTEOPENIA 02/18/2009   Repeat DEXA 07/2014 showed osteoporosis by T score in radius but spine and hip T scores were normal-continue vit D and calcium and repeat DEXA in 2  yrs  . Peripheral edema    LE's, nonpitting    Past Surgical History:  Procedure Laterality Date  . ABDOMINAL HYSTERECTOMY     for DUB (ovaries are still in)  . BACK SURGERY     X 5: fusion of L3-L4 through L5-S1  . CATARACT EXTRACTION Bilateral   . CHOLECYSTECTOMY  2012  . COLONOSCOPY  06/2007; 01/2015   2008 Diverticulosis, o/w normal.  2016 showed lymphocytic colitis with mild diverticulosis: pt was started on oral budesonide at that time.  . ESOPHAGOGASTRODUODENOSCOPY  10/2007; 11/27/15   Small hiatal hernia, o/w normal.  . SHOULDER SURGERY Right    \  . TOTAL HIP ARTHROPLASTY  05/31/2012   Procedure: TOTAL HIP ARTHROPLASTY ANTERIOR APPROACH;  Surgeon: Mauri Pole, MD;  Location: [facility];  Service: Orthopedics;  Laterality: Left;    Outpatient Medications Prior to Visit  Medication Sig Dispense Refill  . alprazolam (XANAX) 2 MG tablet Take one-half to one tablet by mouth twice daily as needed 180 tablet 1  . dicyclomine (BENTYL) 10 MG capsule TAKE ONE CAPSULE BY MOUTH  THREE OR FOUR TIMES DAILY  AS NEEDED FOR CRAMPING/PAIN 270 capsule 1  . methocarbamol (ROBAXIN) 500 MG tablet Take 500 mg by mouth 4 (four) times daily.    . Oxycodone HCl  10 MG TABS TAKE 1 TABLET BY MOUTH 3 TIMES DAILY AS NEEDED FOR PAIN  0  . sertraline (ZOLOFT) 100 MG tablet TAKE ONE AND ONE-HALF  TABLET BY MOUTH NIGHTLY AT  BEDTIME 135 tablet 3  . traZODone (DESYREL) 50 MG tablet Take 4 tablets by mouth  every night at bedtime as  needed for insomnia 360 tablet 3  . levothyroxine (SYNTHROID, LEVOTHROID) 75 MCG tablet Take 1 tablet by mouth once daily with breakfast (Patient taking differently: take in morning on an empty stomach and nothing by mouth for 30 min after taking) 90 tablet 3  . ibuprofen (ADVIL,MOTRIN) 600 MG tablet Take 600 mg by mouth every 8 (eight) hours as needed for moderate pain. Post surgical    . balsalazide (COLAZAL) 750 MG capsule Take 2 capsules (1,500 mg total) by mouth 3 (three) times  daily. (Patient not taking: Reported on 08/12/2016) 180 capsule 5  . omeprazole (PRILOSEC) 40 MG capsule Take 1 capsule (40 mg total) by mouth daily. (Patient not taking: Reported on 08/12/2016) 90 capsule 3   No facility-administered medications prior to visit.     No Known Allergies  ROS As per HPI  PE: Blood pressure 120/76, pulse 64, temperature 98.3 F (36.8 C), temperature source Oral, resp. rate 16, height 5\' 4"  (1.626 m), weight 126 lb 8 oz (57.4 kg), SpO2 98 %. Gen: Alert, well appearing.  Patient is oriented to person, place, time, and situation. AFFECT: pleasant, lucid thought and speech. No further exam today.  LABS:  Lab Results  Component Value Date   TSH 0.51 02/13/2016   Lab Results  Component Value Date   WBC 5.7 02/13/2016   HGB 11.0 (L) 02/13/2016   HCT 32.4 (L) 02/13/2016   MCV 91.8 02/13/2016   PLT 189.0 02/13/2016   Lab Results  Component Value Date   IRON 123 05/23/2015   FERRITIN 71.8 05/23/2015    Lab Results  Component Value Date   CREATININE 0.79 10/23/2015   BUN 7 10/23/2015   NA 136 10/23/2015   K 4.4 10/23/2015   CL 99 10/23/2015   CO2 30 10/23/2015   Lab Results  Component Value Date   ALT 13 09/30/2015   AST 14 09/30/2015   ALKPHOS 65 09/30/2015   BILITOT 0.4 09/30/2015   Lab Results  Component Value Date   CHOL 223 (H) 05/23/2015   Lab Results  Component Value Date   HDL 55.30 05/23/2015   Lab Results  Component Value Date   LDLCALC 147 (H) 05/23/2015   Lab Results  Component Value Date   TRIG 102.0 05/23/2015   Lab Results  Component Value Date   CHOLHDL 4 05/23/2015   Lab Results  Component Value Date   VITAMINB12 317 05/23/2015   IMPRESSION AND PLAN:  1) Chronic normocytic anemia: GI w/u unrevealing in 2016 after heme+ stool detected. Chronic lymphocytic colitis has complicated things with this dx.   Monitor CBC and iron levels today.  2) Hypothyroidism: The current medical regimen is effective;   continue present plan and medications. TSH check today.  3) Chronic anxiety and depression: The current medical regimen is effective;  continue present plan and medications.  4) Chronic lymphocytic colitis: seems quiescent at this time.  She has occ IBS-type sx's and is doing fine on prn antispasmodic.   An After Visit Summary was printed and given to the patient.  FOLLOW UP: Return in about 6 months (around 02/12/2017) for medicare AWV.  Signed:  Crissie Sickles,  MD           08/12/2016

## 2016-08-22 ENCOUNTER — Other Ambulatory Visit: Payer: Self-pay | Admitting: Gastroenterology

## 2016-09-02 ENCOUNTER — Encounter: Payer: Self-pay | Admitting: Family Medicine

## 2016-09-14 ENCOUNTER — Encounter: Payer: Self-pay | Admitting: Family Medicine

## 2016-09-14 DIAGNOSIS — M810 Age-related osteoporosis without current pathological fracture: Secondary | ICD-10-CM | POA: Diagnosis not present

## 2016-09-22 ENCOUNTER — Telehealth: Payer: Self-pay | Admitting: Family Medicine

## 2016-09-22 ENCOUNTER — Encounter: Payer: Self-pay | Admitting: Family Medicine

## 2016-09-22 NOTE — Telephone Encounter (Signed)
Pls notify pt that her bone density testing showed thin bones but not to the level of osteoporosis.  Continue 1500 mg Calcium per day and 800 Units of vit D per day. We'll repeat bone density testing in 2 yrs.

## 2016-09-23 NOTE — Telephone Encounter (Signed)
Patient notified of test results and verbalized understanding.   

## 2016-10-01 ENCOUNTER — Encounter: Payer: Self-pay | Admitting: Family Medicine

## 2016-10-05 ENCOUNTER — Telehealth: Payer: Self-pay | Admitting: Family Medicine

## 2016-10-05 NOTE — Telephone Encounter (Signed)
Patient called stating that her insurance company faxed [place] a form toward the end of September.  I could not understand what kind of form she is talking about.  Something about home health care or nursing home.  Please let me know if you have seen form.

## 2016-10-05 NOTE — Telephone Encounter (Signed)
Told patient to contact her insurance company to have them refax the form.

## 2016-10-05 NOTE — Telephone Encounter (Signed)
Sorry, I don't know what she is talking about.

## 2016-10-07 ENCOUNTER — Other Ambulatory Visit: Payer: Self-pay | Admitting: Family Medicine

## 2016-10-07 NOTE — Telephone Encounter (Signed)
Pls notify pharmacy that the rx I did back in July was for 90 d supply (pt should not be out of it yet) PLUS it already had a RF on it.  Is this the pharmacy's way of asking me if an early RF is OKAY?  Let me know.

## 2016-10-07 NOTE — Telephone Encounter (Signed)
Patient requesting rf of xanax to be sent to [facility].  Last Rx 07/27/16 x 1 rf. Please advise.

## 2016-10-08 NOTE — Telephone Encounter (Signed)
Noted. Agree.

## 2016-10-08 NOTE — Telephone Encounter (Signed)
Spoke to patient and to [place].Marland Kitchen  [facility] states that her last xanax RX was sent 07/27/16 and she should not be out of medications.   [facility] did sent RF 10/06/16 so it should be arriving within 2-5 days.  Contacted patient to see how she is taking the RX, she states she is taking only 1.5 per day.   I advised her that if she was only taking that amount she should not be out.  I told her to make sure she is not getting them mixed up or sharing medications.  She still stated that she was not doing either but she is out of medication.  I told her [facility] sent her RX 10/06/16 and that if should be arriving soon and that we could not authorize another Rx at this time and to be sure this RX lasted 3 months. Pt expressed understanding.

## 2016-10-20 DIAGNOSIS — H26493 Other secondary cataract, bilateral: Secondary | ICD-10-CM | POA: Diagnosis not present

## 2016-10-20 DIAGNOSIS — Z135 Encounter for screening for eye and ear disorders: Secondary | ICD-10-CM | POA: Diagnosis not present

## 2016-10-20 DIAGNOSIS — H524 Presbyopia: Secondary | ICD-10-CM | POA: Diagnosis not present

## 2016-10-20 DIAGNOSIS — H527 Unspecified disorder of refraction: Secondary | ICD-10-CM | POA: Diagnosis not present

## 2016-10-30 ENCOUNTER — Telehealth: Payer: Self-pay

## 2016-10-30 NOTE — Telephone Encounter (Signed)
Patient calling to check status of paperwork for Dr. Anitra Lauth to complete for [place]. Patient states paperwork was re-faxed this week. Please call to advise.

## 2016-11-04 DIAGNOSIS — M5137 Other intervertebral disc degeneration, lumbosacral region: Secondary | ICD-10-CM | POA: Diagnosis not present

## 2016-11-04 DIAGNOSIS — M19012 Primary osteoarthritis, left shoulder: Secondary | ICD-10-CM | POA: Diagnosis not present

## 2016-11-04 DIAGNOSIS — L4050 Arthropathic psoriasis, unspecified: Secondary | ICD-10-CM | POA: Diagnosis not present

## 2016-11-17 DIAGNOSIS — M50322 Other cervical disc degeneration at C5-C6 level: Secondary | ICD-10-CM | POA: Diagnosis not present

## 2016-11-17 DIAGNOSIS — M5136 Other intervertebral disc degeneration, lumbar region: Secondary | ICD-10-CM | POA: Diagnosis not present

## 2016-11-17 DIAGNOSIS — M542 Cervicalgia: Secondary | ICD-10-CM | POA: Diagnosis not present

## 2016-11-17 DIAGNOSIS — G894 Chronic pain syndrome: Secondary | ICD-10-CM | POA: Diagnosis not present

## 2016-12-16 ENCOUNTER — Encounter: Payer: Self-pay | Admitting: Family Medicine

## 2016-12-16 ENCOUNTER — Ambulatory Visit (INDEPENDENT_AMBULATORY_CARE_PROVIDER_SITE_OTHER): Payer: Medicare Other | Admitting: Family Medicine

## 2016-12-16 VITALS — BP 146/97 | HR 72 | Temp 98.1°F | Resp 16 | Ht 64.0 in | Wt 129.8 lb

## 2016-12-16 DIAGNOSIS — G4486 Cervicogenic headache: Secondary | ICD-10-CM

## 2016-12-16 DIAGNOSIS — M7918 Myalgia, other site: Secondary | ICD-10-CM

## 2016-12-16 DIAGNOSIS — R51 Headache: Secondary | ICD-10-CM

## 2016-12-16 DIAGNOSIS — M47812 Spondylosis without myelopathy or radiculopathy, cervical region: Secondary | ICD-10-CM

## 2016-12-16 DIAGNOSIS — M791 Myalgia: Secondary | ICD-10-CM

## 2016-12-16 DIAGNOSIS — M4692 Unspecified inflammatory spondylopathy, cervical region: Secondary | ICD-10-CM

## 2016-12-16 NOTE — Progress Notes (Signed)
Pre visit review using our clinic review tool, if applicable. No additional management support is needed unless otherwise documented below in the visit note. 

## 2016-12-16 NOTE — Progress Notes (Signed)
OFFICE VISIT  12/16/2016   CC:  Chief Complaint  Patient presents with  . Neck Pain    x 2 weeks   HPI:    Patient is a 70 y.o. Caucasian female who presents for pain in neck, on sides, extends up towards ears and then to the top of her head.  Symptoms present constantly for the last 2 weeks.  No injury or strain or trauma prior to onset of the pain. No radiation of the pain down her arms.  No paresthesias. The ear and head pain came on after her neck pain started.  No ringing in ears.  Light and noise makes her HA worse.  Has been under a lot of stress lately, esp with husband's medical problems.  No n/v.  No falls. No dizziness, no vision complaints.   She has an orthopedist, Dr. Veverly Fells.  She also sees Dr. Nelva Bush, I believe for chronic arthritic pain.  Past Medical History:  Diagnosis Date  . ALLERGIC RHINITIS 06/02/2007  . Allergy   . Arthritis    DDD of cervical, thoracic, and lumbar spine  . Blood transfusion without reported diagnosis   . Cataract    Bilateral removed cateracts  . Cervical spondylosis    C5-6 and C6-7.  Marland Kitchen Chronic low back pain 02/18/2009   s/p fusion L3-4 through L5-S1.  Pain meds per Dr. Nelva Bush  . DEPRESSION 06/02/2007  . Diverticulosis   . GERD (gastroesophageal reflux disease)   . Heart murmur   . Hiatal hernia   . HYPOTHYROIDISM 06/02/2007   GOITER  . INSOMNIA 06/02/2007  . Lymphocytic colitis   . Muscle spasms of lower extremity   . Nasal septal perforation    chronic; hx of epistaxis  . Normocytic anemia 04/2015   HEME + in ED 08/07/15--endoscopic eval unrevealing.  Iron studies fine.  ?Anemia of chronic dz (lymphocytic colitis?)  . OSTEOPENIA 02/18/2009   Repeat DEXA 07/2014 showed osteoporosis by T score in radius but spine and hip T scores were normal-continue vit D and calcium and repeat DEXA 08/2016 unchanged.  Repeat DEXA 2 yrs.  . Peripheral edema    LE's, nonpitting    Past Surgical History:  Procedure Laterality Date  . ABDOMINAL HYSTERECTOMY      for DUB (ovaries are still in)  . BACK SURGERY     X 5: fusion of L3-L4 through L5-S1  . CATARACT EXTRACTION Bilateral   . CHOLECYSTECTOMY  2012  . COLONOSCOPY  06/2007; 01/2015   2008 Diverticulosis, o/w normal.  2016 showed lymphocytic colitis with mild diverticulosis: pt was started on oral budesonide at that time.  Marland Kitchen DEXA  08/16/2014; 09/14/16   Hip and spine ok; radius osteoporotic---but meds not indicated.  Repeat 08/2018  . ESOPHAGOGASTRODUODENOSCOPY  10/2007; 11/27/15   Small hiatal hernia, o/w normal.  . SHOULDER SURGERY Right    \  . TOTAL HIP ARTHROPLASTY  05/31/2012   Procedure: TOTAL HIP ARTHROPLASTY ANTERIOR APPROACH;  Surgeon: Mauri Pole, MD;  Location: [facility];  Service: Orthopedics;  Laterality: Left;    Outpatient Medications Prior to Visit  Medication Sig Dispense Refill  . alprazolam (XANAX) 2 MG tablet Take one-half to one tablet by mouth twice daily as needed 180 tablet 1  . dicyclomine (BENTYL) 10 MG capsule TAKE ONE CAPSULE BY MOUTH  THREE OR FOUR TIMES DAILY  AS NEEDED FOR CRAMPING/PAIN 270 capsule 0  . ibuprofen (ADVIL,MOTRIN) 600 MG tablet Take 600 mg by mouth every 8 (eight) hours as needed  for moderate pain. Post surgical    . levothyroxine (SYNTHROID, LEVOTHROID) 75 MCG tablet Take 75 mcg by mouth daily before breakfast.    . methocarbamol (ROBAXIN) 500 MG tablet Take 500 mg by mouth 4 (four) times daily.    . Oxycodone HCl 10 MG TABS TAKE 1 TABLET BY MOUTH 3 TIMES DAILY AS NEEDED FOR PAIN  0  . sertraline (ZOLOFT) 100 MG tablet TAKE ONE AND ONE-HALF  TABLET BY MOUTH NIGHTLY AT  BEDTIME 135 tablet 3  . traZODone (DESYREL) 50 MG tablet Take 4 tablets by mouth  every night at bedtime as  needed for insomnia 360 tablet 3   No facility-administered medications prior to visit.     No Known Allergies  ROS As per HPI  PE: Blood pressure (!) 146/97, pulse 72, temperature 98.1 F (36.7 C), temperature source Oral, resp. rate 16, height 5\' 4"  (1.626 m),  weight 129 lb 12 oz (58.9 kg), SpO2 98 %. Gen: Alert, well appearing.  Patient is oriented to person, place, time, and situation. ENT: Ears: EACs clear, normal epithelium.  TMs with good light reflex and landmarks bilaterally.  Eyes: no injection, icteris, swelling, or exudate.  EOMI, PERRLA. Nose: no drainage or turbinate edema/swelling.  She has chronic large septal perforation..  No injection or focal lesion.  Mouth: lips without lesion/swelling.  Oral mucosa pink and moist.  Dentition intact and without obvious caries or gingival swelling.  Oropharynx without erythema, exudate, or swelling.  Neck: TTP diffusely in lateral and posterior cervical soft tissues, without trigger point or nodule.  Also TTP in soft tissue regions of upper back diffusely.  She has full flexion but this causes posterior neck pain.  Rotation and lateral bending are mildly limited and cause some diffuse posterior and lateral neck discomfort. Neuro: CN 2-12 intact bilaterally, strength 5/5 in proximal and distal upper extremities and lower extremities bilaterally.   No tremor.   No ataxia.   No pronator drift.   LABS:  None today  IMPRESSION AND PLAN:  Cervical spine arthritic pain + myofascial pain in C spine and upper back. 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 Recommended PT but pt declined at this time. I encouraged pt seek opinion of her orthopedist, esp if she began to have any radicular pain or paresthesias. No new meds rx'd.   Recommended heat application to upper back and neck. She uses ibuprofen sparingly. Also has oxycodone that she uses at night sparingly--this is rx'd by Dr. Nelva Bush.  An After Visit Summary was printed and given to the patient.  Spent 25 min with pt today, with >50% of this time spent in counseling and care coordination regarding the above problems.  FOLLOW UP: Return if symptoms worsen or fail to improve.  Signed:  Crissie Sickles, MD            12/16/2016

## 2016-12-22 ENCOUNTER — Other Ambulatory Visit: Payer: Self-pay | Admitting: *Deleted

## 2016-12-22 MED ORDER — ALPRAZOLAM 2 MG PO TABS: ORAL_TABLET | ORAL | 1 refills | Status: DC

## 2016-12-22 NOTE — Telephone Encounter (Signed)
Rx faxed

## 2016-12-22 NOTE — Telephone Encounter (Signed)
RF request for alprazolam LOV: 02/13/16 Next ov: 02/09/17 Last written: 07/27/16 #180 w/ 1RF  Please advise. Thanks.

## 2017-01-05 DIAGNOSIS — M25511 Pain in right shoulder: Secondary | ICD-10-CM | POA: Diagnosis not present

## 2017-01-05 DIAGNOSIS — M25512 Pain in left shoulder: Secondary | ICD-10-CM | POA: Diagnosis not present

## 2017-01-05 DIAGNOSIS — M50322 Other cervical disc degeneration at C5-C6 level: Secondary | ICD-10-CM | POA: Diagnosis not present

## 2017-01-05 DIAGNOSIS — M542 Cervicalgia: Secondary | ICD-10-CM | POA: Diagnosis not present

## 2017-01-05 DIAGNOSIS — G8929 Other chronic pain: Secondary | ICD-10-CM | POA: Diagnosis not present

## 2017-01-26 DIAGNOSIS — M542 Cervicalgia: Secondary | ICD-10-CM | POA: Diagnosis not present

## 2017-02-08 ENCOUNTER — Ambulatory Visit: Payer: Self-pay

## 2017-02-09 ENCOUNTER — Ambulatory Visit: Payer: Self-pay | Admitting: Family Medicine

## 2017-02-13 DIAGNOSIS — M5136 Other intervertebral disc degeneration, lumbar region: Secondary | ICD-10-CM | POA: Diagnosis not present

## 2017-02-13 DIAGNOSIS — M50322 Other cervical disc degeneration at C5-C6 level: Secondary | ICD-10-CM | POA: Diagnosis not present

## 2017-02-13 DIAGNOSIS — G894 Chronic pain syndrome: Secondary | ICD-10-CM | POA: Diagnosis not present

## 2017-02-13 DIAGNOSIS — Z79891 Long term (current) use of opiate analgesic: Secondary | ICD-10-CM | POA: Diagnosis not present

## 2017-02-16 ENCOUNTER — Other Ambulatory Visit: Payer: Self-pay | Admitting: Family Medicine

## 2017-02-16 MED ORDER — ALPRAZOLAM 2 MG PO TABS: ORAL_TABLET | ORAL | 1 refills | Status: DC

## 2017-02-16 NOTE — Telephone Encounter (Signed)
Fax from [facility] requesting refill.   RF request for alprazolam LOV: 08/02/16 Next ov: None Last written: 12/22/16 #180 w/ 1RF  Please advise. Thanks.

## 2017-02-16 NOTE — Telephone Encounter (Signed)
[facility].  RF request for trazodone LOV: 08/12/16 Next ov: None Last written: 04/23/16 #360 w/ 3RF  Please advise. Thanks.  ____________________________________  RF request for mylan-levothyroxine    08/12/16 TSH - 2.64 Last written: 04/23/16 #90 w/ 3RF  RF request for omeprazole Last written: 03/23/16 #90 w/ 3Rf  RF request for sertraline Last written: 04/23/16 #135 w/ 3RF

## 2017-03-11 DIAGNOSIS — M25511 Pain in right shoulder: Secondary | ICD-10-CM | POA: Diagnosis not present

## 2017-03-11 DIAGNOSIS — M25512 Pain in left shoulder: Secondary | ICD-10-CM | POA: Diagnosis not present

## 2017-03-11 DIAGNOSIS — G8929 Other chronic pain: Secondary | ICD-10-CM | POA: Diagnosis not present

## 2017-03-11 DIAGNOSIS — M19012 Primary osteoarthritis, left shoulder: Secondary | ICD-10-CM | POA: Diagnosis not present

## 2017-03-18 ENCOUNTER — Encounter: Payer: Self-pay | Admitting: Family Medicine

## 2017-03-18 ENCOUNTER — Ambulatory Visit (INDEPENDENT_AMBULATORY_CARE_PROVIDER_SITE_OTHER): Payer: Medicare Other | Admitting: Family Medicine

## 2017-03-18 VITALS — BP 119/74 | HR 73 | Temp 97.5°F | Resp 16 | Wt 125.0 lb

## 2017-03-18 DIAGNOSIS — R609 Edema, unspecified: Secondary | ICD-10-CM

## 2017-03-18 DIAGNOSIS — R319 Hematuria, unspecified: Secondary | ICD-10-CM | POA: Diagnosis not present

## 2017-03-18 DIAGNOSIS — N390 Urinary tract infection, site not specified: Secondary | ICD-10-CM | POA: Diagnosis not present

## 2017-03-18 DIAGNOSIS — J301 Allergic rhinitis due to pollen: Secondary | ICD-10-CM | POA: Diagnosis not present

## 2017-03-18 LAB — POCT URINALYSIS DIPSTICK
Bilirubin, UA: NEGATIVE
Glucose, UA: NEGATIVE
KETONES UA: NEGATIVE
NITRITE UA: POSITIVE
PH UA: 7 (ref 5.0–8.0)
PROTEIN UA: NEGATIVE
Spec Grav, UA: 1.015 (ref 1.030–1.035)
UROBILINOGEN UA: 0.2 (ref ?–2.0)

## 2017-03-18 LAB — COMPREHENSIVE METABOLIC PANEL
ALBUMIN: 4.1 g/dL (ref 3.5–5.2)
ALK PHOS: 99 U/L (ref 39–117)
ALT: 14 U/L (ref 0–35)
AST: 18 U/L (ref 0–37)
BUN: 12 mg/dL (ref 6–23)
CALCIUM: 9.6 mg/dL (ref 8.4–10.5)
CHLORIDE: 102 meq/L (ref 96–112)
CO2: 28 mEq/L (ref 19–32)
Creatinine, Ser: 0.71 mg/dL (ref 0.40–1.20)
GFR: 86.04 mL/min (ref >60.00)
Glucose, Bld: 96 mg/dL (ref 70–99)
POTASSIUM: 4.1 meq/L (ref 3.5–5.1)
Sodium: 136 mEq/L (ref 135–145)
TOTAL PROTEIN: 7 g/dL (ref 6.0–8.3)
Total Bilirubin: 0.4 mg/dL (ref 0.2–1.2)

## 2017-03-18 MED ORDER — SULFAMETHOXAZOLE-TRIMETHOPRIM 800-160 MG PO TABS: 1.0000 | ORAL_TABLET | Freq: Two times a day (BID) | ORAL | 0 refills | Status: DC

## 2017-03-18 MED ORDER — MONTELUKAST SODIUM 10 MG PO TABS: 10.0000 mg | ORAL_TABLET | Freq: Every day | ORAL | 3 refills | Status: DC

## 2017-03-18 NOTE — Progress Notes (Signed)
OFFICE VISIT  03/18/2017   CC:  Chief Complaint  Patient presents with  . Follow-up    RCI   HPI:    Patient is a 71 y.o. Caucasian female who presents for 3 mo f/u: she presents today complaining of allergies and swelling in legs. With fluctuating weather lately, lots of sneezing, itchy eyes, coughing some.  Some clear runny nose. Uses saline nasal spray occ, no otc allergy meds.  Says antihistamine has not helped in the past. Hasn't ever been on singulair.  Nasal steroids have never helped per her report today. No wheezing or SOB or chest tightness.  Has chronic swelling in LL's and feet.  Occ the swelling is 24/7, occ it goes down at night. Tries to elevated them--says no help.  No compression hose.  Tries to eat low sodium diet. Denies orthopnea or PND.  No DOE.  No CP.   She does report some increase in her urinary frequency and also urinary urgency lately.  No dysuria.  No fevers. No abd pain.  No n/v.   Past Medical History:  Diagnosis Date  . ALLERGIC RHINITIS 06/02/2007  . Allergy   . Arthritis    DDD of cervical, thoracic, and lumbar spine  . Blood transfusion without reported diagnosis   . Cataract    Bilateral removed cateracts  . Cervical spondylosis    C5-6 and C6-7.  Marland Kitchen Chronic low back pain 02/18/2009   s/p fusion L3-4 through L5-S1.  Pain meds per Dr. Nelva Bush  . DEPRESSION 06/02/2007  . Diverticulosis   . GERD (gastroesophageal reflux disease)   . Heart murmur   . Hiatal hernia   . HYPOTHYROIDISM 06/02/2007   GOITER  . INSOMNIA 06/02/2007  . Lymphocytic colitis   . Muscle spasms of lower extremity   . Nasal septal perforation    chronic; hx of epistaxis  . Normocytic anemia 04/2015   HEME + in ED 08/07/15--endoscopic eval unrevealing.  Iron studies fine.  ?Anemia of chronic dz (lymphocytic colitis?)  . OSTEOPENIA 02/18/2009   Repeat DEXA 07/2014 showed osteoporosis by T score in radius but spine and hip T scores were normal-continue vit D and calcium and repeat  DEXA 08/2016 unchanged.  Repeat DEXA 2 yrs.  . Peripheral edema    LE's, nonpitting    Past Surgical History:  Procedure Laterality Date  . ABDOMINAL HYSTERECTOMY     for DUB (ovaries are still in)  . BACK SURGERY     X 5: fusion of L3-L4 through L5-S1  . CATARACT EXTRACTION Bilateral   . CHOLECYSTECTOMY  2012  . COLONOSCOPY  06/2007; 01/2015   2008 Diverticulosis, o/w normal.  2016 showed lymphocytic colitis with mild diverticulosis: pt was started on oral budesonide at that time.  Marland Kitchen DEXA  08/16/2014; 09/14/16   Hip and spine ok; radius osteoporotic---but meds not indicated.  Repeat 08/2018  . ESOPHAGOGASTRODUODENOSCOPY  10/2007; 11/27/15   Small hiatal hernia, o/w normal.  . SHOULDER SURGERY Right    \  . TOTAL HIP ARTHROPLASTY  05/31/2012   Procedure: TOTAL HIP ARTHROPLASTY ANTERIOR APPROACH;  Surgeon: Mauri Pole, MD;  Location: [facility];  Service: Orthopedics;  Laterality: Left;    Outpatient Medications Prior to Visit  Medication Sig Dispense Refill  . alprazolam (XANAX) 2 MG tablet Take one-half to one tablet by mouth twice daily as needed 180 tablet 1  . dicyclomine (BENTYL) 10 MG capsule TAKE ONE CAPSULE BY MOUTH  THREE OR FOUR TIMES DAILY  AS NEEDED FOR  CRAMPING/PAIN 270 capsule 0  . ibuprofen (ADVIL,MOTRIN) 600 MG tablet Take 600 mg by mouth every 8 (eight) hours as needed for moderate pain. Post surgical    . levothyroxine (SYNTHROID, LEVOTHROID) 75 MCG tablet TAKE 1 TABLET BY MOUTH ONCE DAILY WITH BREAKFAST 90 tablet 3  . methocarbamol (ROBAXIN) 500 MG tablet Take 500 mg by mouth 4 (four) times daily.    Marland Kitchen omeprazole (PRILOSEC) 40 MG capsule TAKE 1 CAPSULE BY MOUTH  DAILY 90 capsule 3  . Oxycodone HCl 10 MG TABS TAKE 1 TABLET BY MOUTH 3 TIMES DAILY AS NEEDED FOR PAIN  0  . sertraline (ZOLOFT) 100 MG tablet TAKE ONE AND ONE-HALF  TABLET BY MOUTH NIGHTLY AT  BEDTIME 135 tablet 3  . traZODone (DESYREL) 50 MG tablet TAKE 4 TABLETS BY MOUTH  EVERY NIGHT AT BEDTIME AS  NEEDED FOR  INSOMNIA 360 tablet 3   No facility-administered medications prior to visit.     No Known Allergies  ROS As per HPI  PE: Blood pressure 119/74, pulse 73, temperature 97.5 F (36.4 C), temperature source Oral, resp. rate 16, weight 125 lb (56.7 kg), SpO2 97 %. Gen: Alert, well appearing.  Patient is oriented to person, place, time, and situation. AFFECT: pleasant, lucid thought and speech. ENT: Ears: EACs clear, normal epithelium.  TMs with good light reflex and landmarks bilaterally.  Eyes: no injection, icteris, swelling, or exudate.  EOMI, PERRLA. Nose: no drainage or turbinate edema/swelling.  No injection or focal lesion.  Mouth: lips without lesion/swelling.  Oral mucosa pink and moist.  Dentition intact and without obvious caries or gingival swelling.  Oropharynx without erythema, exudate, or swelling.  CV: RRR, 1/6 syst murmur, no diastolic murmur.  No r/g.   LUNGS: CTA bilat, nonlabored resps, good aeration in all lung fields. EXT: very trace pitting edema, mild non-pitting edema : both LL's.  LABS:   CC UA today: no protein, but nitrite pos and trace intact blood.  Lab Results  Component Value Date   TSH 2.64 08/12/2016     Chemistry      Component Value Date/Time   NA 135 08/12/2016 1345   K 4.1 08/12/2016 1345   CL 99 08/12/2016 1345   CO2 31 08/12/2016 1345   BUN 10 08/12/2016 1345   CREATININE 0.76 08/12/2016 1345      Component Value Date/Time   CALCIUM 9.6 08/12/2016 1345   ALKPHOS 65 09/30/2015 1504   AST 14 09/30/2015 1504   ALT 13 09/30/2015 1504   BILITOT 0.4 09/30/2015 1504     Lab Results  Component Value Date   CHOL 223 (H) 05/23/2015   HDL 55.30 05/23/2015   LDLCALC 147 (H) 05/23/2015   LDLDIRECT 133.3 12/16/2011   TRIG 102.0 05/23/2015   CHOLHDL 4 05/23/2015   Lab Results  Component Value Date   WBC 5.7 08/12/2016   HGB 10.9 (L) 08/12/2016   HCT 32.3 (L) 08/12/2016   MCV 93.0 08/12/2016   PLT 232.0 08/12/2016    Iron/TIBC/Ferritin/ %Sat    Component Value Date/Time   IRON 107 08/12/2016 1345   IRON 105 08/12/2016 1345   TIBC 272 08/12/2016 1345   FERRITIN 66.9 08/12/2016 1345   IRONPCTSAT 39 08/12/2016 1345   Lab Results  Component Value Date   VITAMINB12 317 05/23/2015   IMPRESSION AND PLAN:  1) Allergic rhinitis, seasonal. Will try singulair 10mg  qhs since she says nasal steroids and nonsedating antihistamines don't help her. Continue saline nasal spray.  2) Chronic  lower leg symmetric edema (non-pitting, for the most part). Check UA for protein--none seen today. Check CMET for T prot and albumin levels. Continue low sodium diet. I gave her a rx for compression hose, 20-30 mm Hg.  3) UTI: send urine for c/s. Start bactrim DS 1 bid x 3d.  An After Visit Summary was printed and given to the patient.   FOLLOW UP:  4 mo  Signed:  Crissie Sickles, MD           03/18/2017

## 2017-03-20 LAB — URINE CULTURE

## 2017-03-22 ENCOUNTER — Other Ambulatory Visit: Payer: Self-pay

## 2017-03-22 MED ORDER — SULFAMETHOXAZOLE-TRIMETHOPRIM 800-160 MG PO TABS: 1.0000 | ORAL_TABLET | Freq: Two times a day (BID) | ORAL | 0 refills | Status: DC

## 2017-03-22 NOTE — Telephone Encounter (Signed)
Refilled Bactrim DS. Order per Dr. Anitra Lauth.

## 2017-03-23 DIAGNOSIS — M542 Cervicalgia: Secondary | ICD-10-CM | POA: Diagnosis not present

## 2017-03-30 ENCOUNTER — Other Ambulatory Visit: Payer: Self-pay | Admitting: Family Medicine

## 2017-04-09 NOTE — Progress Notes (Signed)
Subjective:   April Brewer is a 71 y.o. female who presents for Medicare Annual (Subsequent) preventive examination.  Review of Systems:  No ROS.  Medicare Wellness Visit.  Cardiac Risk Factors include: advanced age (>4men, >42 women);sedentary lifestyle   Sleep patterns: Sleeps about 8 hours. Up to void rarely.  Home Safety/Smoke Alarms:  Smoke detectors and security in place.  Living environment; residence and Firearm Safety: Lives with husband son in split level home. Steps throughout home, process of installing rails. Feels safe in home. Firearms locked away.  Seat Belt Safety/Bike Helmet: Wears seat belt.   Counseling:   Eye Exam-Last exam 2017, yearly by [facility] in [place].  Dental-Last exam > 2 years. Dental insurance to start in May.   Female:   Pap-N/A      Mammo-06/02/2016, negative.Will schedule in June      Dexa scan-09/14/2016, Osteoporosis.         CCS-Colonoscopy 02/12/2015, normal.       Objective:     Vitals: BP 114/66 (BP Location: Left Arm, Patient Position: Sitting, Cuff Size: Normal)   Pulse 60   Ht 5\' 4"  (1.626 m)   Wt 131 lb (59.4 kg)   SpO2 95%   BMI 22.49 kg/m   Body mass index is 22.49 kg/m.   Tobacco History  Smoking Status  . Never Smoker  Smokeless Tobacco  . Never Used     Counseling given: No   Past Medical History:  Diagnosis Date  . ALLERGIC RHINITIS 06/02/2007  . Allergy   . Arthritis    DDD of cervical, thoracic, and lumbar spine  . Blood transfusion without reported diagnosis   . Cataract    Bilateral removed cateracts  . Cervical spondylosis    C5-6 and C6-7.  Marland Kitchen Chronic low back pain 02/18/2009   s/p fusion L3-4 through L5-S1.  Pain meds per Dr. Nelva Bush  . DEPRESSION 06/02/2007  . Diverticulosis   . GERD (gastroesophageal reflux disease)   . Heart murmur   . Hiatal hernia   . HYPOTHYROIDISM 06/02/2007   GOITER  . INSOMNIA 06/02/2007  . Lymphocytic colitis   . Muscle spasms of lower extremity   . Nasal  septal perforation    chronic; hx of epistaxis  . Normocytic anemia 04/2015   HEME + in ED 08/07/15--endoscopic eval unrevealing.  Iron studies fine.  ?Anemia of chronic dz (lymphocytic colitis?)  . OSTEOPENIA 02/18/2009   Repeat DEXA 07/2014 showed osteoporosis by T score in radius but spine and hip T scores were normal-continue vit D and calcium and repeat DEXA 08/2016 unchanged.  Repeat DEXA 2 yrs.  . Peripheral edema    LE's, nonpitting   Past Surgical History:  Procedure Laterality Date  . ABDOMINAL HYSTERECTOMY     for DUB (ovaries are still in)  . BACK SURGERY     X 5: fusion of L3-L4 through L5-S1  . CATARACT EXTRACTION Bilateral   . CHOLECYSTECTOMY  2012  . COLONOSCOPY  06/2007; 01/2015   2008 Diverticulosis, o/w normal.  2016 showed lymphocytic colitis with mild diverticulosis: pt was started on oral budesonide at that time.  Marland Kitchen DEXA  08/16/2014; 09/14/16   Hip and spine ok; radius osteoporotic---but meds not indicated.  Repeat 08/2018  . ESOPHAGOGASTRODUODENOSCOPY  10/2007; 11/27/15   Small hiatal hernia, o/w normal.  . SHOULDER SURGERY Right    \  . TOTAL HIP ARTHROPLASTY  05/31/2012   Procedure: TOTAL HIP ARTHROPLASTY ANTERIOR APPROACH;  Surgeon: Mauri Pole, MD;  Location: [facility];  Service: Orthopedics;  Laterality: Left;   Family History  Problem Relation Age of Onset  . Arthritis Mother   . Stroke Mother   . Breast cancer Mother   . Diabetes Mother   . Colon cancer Father   . Heart disease Father   . Heart disease Sister   . Breast cancer Sister   . Lung cancer Brother     smoked  . Uterine cancer Sister   . Esophageal cancer Neg Hx   . Rectal cancer Neg Hx   . Stomach cancer Neg Hx    History  Sexual Activity  . Sexual activity: Not on file    Outpatient Encounter Prescriptions as of 04/12/2017  Medication Sig  . alprazolam (XANAX) 2 MG tablet Take one-half to one tablet by mouth twice daily as needed  . calcium-vitamin D 250-100 MG-UNIT tablet Take 1  tablet by mouth 2 (two) times daily.  Marland Kitchen dicyclomine (BENTYL) 10 MG capsule TAKE ONE CAPSULE BY MOUTH  THREE OR FOUR TIMES DAILY  AS NEEDED FOR CRAMPING/PAIN  . Docusate Calcium (STOOL SOFTENER PO) Take by mouth daily as needed.  Marland Kitchen ibuprofen (ADVIL,MOTRIN) 600 MG tablet Take 600 mg by mouth every 8 (eight) hours as needed for moderate pain. Post surgical  . levothyroxine (SYNTHROID, LEVOTHROID) 75 MCG tablet TAKE 1 TABLET BY MOUTH ONCE DAILY WITH BREAKFAST  . methocarbamol (ROBAXIN) 500 MG tablet Take 500 mg by mouth 4 (four) times daily.  . montelukast (SINGULAIR) 10 MG tablet Take 1 tablet (10 mg total) by mouth at bedtime.  Marland Kitchen omeprazole (PRILOSEC) 40 MG capsule TAKE 1 CAPSULE BY MOUTH  DAILY  . Oxycodone HCl 10 MG TABS TAKE 1 TABLET BY MOUTH 3 TIMES DAILY AS NEEDED FOR PAIN  . sertraline (ZOLOFT) 100 MG tablet TAKE ONE AND ONE-HALF  TABLET BY MOUTH NIGHTLY AT  BEDTIME  . traZODone (DESYREL) 50 MG tablet TAKE 4 TABLETS BY MOUTH  EVERY NIGHT AT BEDTIME AS  NEEDED FOR INSOMNIA  . sulfamethoxazole-trimethoprim (BACTRIM DS,SEPTRA DS) 800-160 MG tablet Take 1 tablet by mouth 2 (two) times daily. (Patient not taking: Reported on 04/12/2017)   No facility-administered encounter medications on file as of 04/12/2017.     Activities of Daily Living In your present state of health, do you have any difficulty performing the following activities: 04/12/2017  Hearing? N  Vision? N  Difficulty concentrating or making decisions? N  Walking or climbing stairs? N  Dressing or bathing? N  Doing errands, shopping? N  Preparing Food and eating ? N  Using the Toilet? N  In the past six months, have you accidently leaked urine? N  Do you have problems with loss of bowel control? N  Managing your Medications? N  Managing your Finances? N  Housekeeping or managing your Housekeeping? N  Some recent data might be hidden    Patient Care Team: Tammi Sou, MD as PCP - General (Family Medicine) Ladene Artist, MD as Consulting Physician (Gastroenterology) Verda Cumins, MD as Consulting Physician (Rehabilitation) Izora Gala, MD as Consulting Physician (Otolaryngology) Suella Broad, MD as Consulting Physician (Physical Medicine and Rehabilitation) Netta Cedars, MD as Consulting Physician (Orthopedic Surgery)    Assessment:    Physical assessment deferred to PCP.  Exercise Activities and Dietary recommendations Current Exercise Habits: The patient does not participate in regular exercise at present (walks to mailbox), Exercise limited by: orthopedic condition(s) (hip and back discomfort )   Diet (meal preparation, eat out, water  intake, caffeinated beverages, dairy products, fruits and vegetables): Drinks water and diet soda.   Breakfast: Fruit, cereal Lunch: burger, chicken sandwich Dinner: take out (chicken, [place], sandwiches, salad)     Discussed heart healthy diet and increasing activity.   Goals    . walking          Get around more often by increasing walking.       Fall Risk Fall Risk  04/12/2017 09/30/2015  Falls in the past year? Yes Yes  Number falls in past yr: 1 2 or more  Injury with Fall? No -   Depression Screen PHQ 2/9 Scores 04/12/2017 04/12/2017 09/30/2015  PHQ - 2 Score 2 1 0  PHQ- 9 Score 6 - -     Cognitive Function MMSE - Mini Mental State Exam 04/12/2017  Orientation to time 5  Orientation to Place 5  Registration 3  Attention/ Calculation 3  Recall 3  Language- name 2 objects 2  Language- repeat 1  Language- follow 3 step command 3  Language- read & follow direction 1  Write a sentence 1  Copy design 1  Total score 28        Immunization History  Administered Date(s) Administered  . Influenza Split 10/02/2011, 10/25/2012  . Influenza Whole 10/06/2007, 10/23/2008, 10/22/2009, 10/14/2010  . Influenza, High Dose Seasonal PF 09/30/2015  . Influenza,inj,Quad PF,36+ Mos 09/22/2013, 09/20/2014  . Pneumococcal Conjugate-13 09/30/2015  .  Pneumococcal Polysaccharide-23 12/16/2011  . Tdap 12/16/2011   Screening Tests Health Maintenance  Topic Date Due  . Hepatitis C Screening  1946/08/28  . MAMMOGRAM  06/02/2017  . INFLUENZA VACCINE  07/28/2017  . TETANUS/TDAP  12/15/2021  . COLONOSCOPY  02/12/2025  . DEXA SCAN  Completed  . PNA vac Low Risk Adult  Completed   Hepatitis C Screening obtained today.      Plan:     Bring a copy of your advance directives to your next office visit.  Schedule mammogram after 06/02/2017  Start brain stimulating activities (puzzles, reading, adult coloring books, staying active) to keep memory sharp.   Schedule dentist appointment after insurance starts (May).   During the course of the visit the patient was educated and counseled about the following appropriate screening and preventive services:   Vaccines to include Pneumoccal, Influenza, Hepatitis B, Td, Zostavax, HCV  Cardiovascular Disease  Colorectal cancer screening  Bone density screening  Diabetes screening  Glaucoma screening  Mammography/PAP  Nutrition counseling   Patient Instructions (the written plan) was given to the patient.   Gerilyn Nestle, RN  04/12/2017

## 2017-04-09 NOTE — Progress Notes (Signed)
Pre visit review using our clinic review tool, if applicable. No additional management support is needed unless otherwise documented below in the visit note. 

## 2017-04-12 ENCOUNTER — Ambulatory Visit (INDEPENDENT_AMBULATORY_CARE_PROVIDER_SITE_OTHER): Payer: Medicare Other

## 2017-04-12 VITALS — BP 114/66 | HR 60 | Ht 64.0 in | Wt 131.0 lb

## 2017-04-12 DIAGNOSIS — Z0000 Encounter for general adult medical examination without abnormal findings: Secondary | ICD-10-CM | POA: Diagnosis not present

## 2017-04-12 DIAGNOSIS — Z1159 Encounter for screening for other viral diseases: Secondary | ICD-10-CM

## 2017-04-12 NOTE — Patient Instructions (Addendum)
Bring a copy of your advance directives to your next office visit.  Schedule mammogram after 06/02/2017  Start brain stimulating activities (puzzles, reading, adult coloring books, staying active) to keep memory sharp.   Schedule dentist appointment after insurance starts (May).   Fall Prevention in the Home Falls can cause injuries. They can happen to people of all ages. There are many things you can do to make your home safe and to help prevent falls. What can I do on the outside of my home?  Regularly fix the edges of walkways and driveways and fix any cracks.  Remove anything that might make you trip as you walk through a door, such as a raised step or threshold.  Trim any bushes or trees on the path to your home.  Use bright outdoor lighting.  Clear any walking paths of anything that might make someone trip, such as rocks or tools.  Regularly check to see if handrails are loose or broken. Make sure that both sides of any steps have handrails.  Any raised decks and porches should have guardrails on the edges.  Have any leaves, snow, or ice cleared regularly.  Use sand or salt on walking paths during winter.  Clean up any spills in your garage right away. This includes oil or grease spills. What can I do in the bathroom?  Use night lights.  Install grab bars by the toilet and in the tub and shower. Do not use towel bars as grab bars.  Use non-skid mats or decals in the tub or shower.  If you need to sit down in the shower, use a plastic, non-slip stool.  Keep the floor dry. Clean up any water that spills on the floor as soon as it happens.  Remove soap buildup in the tub or shower regularly.  Attach bath mats securely with double-sided non-slip rug tape.  Do not have throw rugs and other things on the floor that can make you trip. What can I do in the bedroom?  Use night lights.  Make sure that you have a light by your bed that is easy to reach.  Do not use  any sheets or blankets that are too big for your bed. They should not hang down onto the floor.  Have a firm chair that has side arms. You can use this for support while you get dressed.  Do not have throw rugs and other things on the floor that can make you trip. What can I do in the kitchen?  Clean up any spills right away.  Avoid walking on wet floors.  Keep items that you use a lot in easy-to-reach places.  If you need to reach something above you, use a strong step stool that has a grab bar.  Keep electrical cords out of the way.  Do not use floor polish or wax that makes floors slippery. If you must use wax, use non-skid floor wax.  Do not have throw rugs and other things on the floor that can make you trip. What can I do with my stairs?  Do not leave any items on the stairs.  Make sure that there are handrails on both sides of the stairs and use them. Fix handrails that are broken or loose. Make sure that handrails are as long as the stairways.  Check any carpeting to make sure that it is firmly attached to the stairs. Fix any carpet that is loose or worn.  Avoid having throw rugs at the  top or bottom of the stairs. If you do have throw rugs, attach them to the floor with carpet tape.  Make sure that you have a light switch at the top of the stairs and the bottom of the stairs. If you do not have them, ask someone to add them for you. What else can I do to help prevent falls?  Wear shoes that:  Do not have high heels.  Have rubber bottoms.  Are comfortable and fit you well.  Are closed at the toe. Do not wear sandals.  If you use a stepladder:  Make sure that it is fully opened. Do not climb a closed stepladder.  Make sure that both sides of the stepladder are locked into place.  Ask someone to hold it for you, if possible.  Clearly mark and make sure that you can see:  Any grab bars or handrails.  First and last steps.  Where the edge of each step  is.  Use tools that help you move around (mobility aids) if they are needed. These include:  Canes.  Walkers.  Scooters.  Crutches.  Turn on the lights when you go into a dark area. Replace any light bulbs as soon as they burn out.  Set up your furniture so you have a clear path. Avoid moving your furniture around.  If any of your floors are uneven, fix them.  If there are any pets around you, be aware of where they are.  Review your medicines with your doctor. Some medicines can make you feel dizzy. This can increase your chance of falling. Ask your doctor what other things that you can do to help prevent falls. This information is not intended to replace advice given to you by your health care provider. Make sure you discuss any questions you have with your health care provider. Document Released: 10/10/2009 Document Revised: 05/21/2016 Document Reviewed: 01/18/2015 Elsevier Interactive Patient Education  2017 [employer] Inc[employer]  Health Maintenance, Female Adopting a healthy lifestyle and getting preventive care can go a long way to promote health and wellness. Talk with your health care provider about what schedule of regular examinations is right for you. This is a good chance for you to check in with your provider about disease prevention and staying healthy. In between checkups, there are plenty of things you can do on your own. Experts have done a lot of research about which lifestyle changes and preventive measures are most likely to keep you healthy. Ask your health care provider for more information. Weight and diet Eat a healthy diet  Be sure to include plenty of vegetables, fruits, low-fat dairy products, and lean protein.  Do not eat a lot of foods high in solid fats, added sugars, or salt.  Get regular exercise. This is one of the most important things you can do for your health.  Most adults should exercise for at least 150 minutes each week. The exercise should  increase your heart rate and make you sweat (moderate-intensity exercise).  Most adults should also do strengthening exercises at least twice a week. This is in addition to the moderate-intensity exercise. Maintain a healthy weight  Body mass index (BMI) is a measurement that can be used to identify possible weight problems. It estimates body fat based on height and weight. Your health care provider can help determine your BMI and help you achieve or maintain a healthy weight.  For females 16 years of age and older:  A BMI below 18.5 is  considered underweight.  A BMI of 18.5 to 24.9 is normal.  A BMI of 25 to 29.9 is considered overweight.  A BMI of 30 and above is considered obese. Watch levels of cholesterol and blood lipids  You should start having your blood tested for lipids and cholesterol at 72 years of age, then have this test every 5 years.  You may need to have your cholesterol levels checked more often if:  Your lipid or cholesterol levels are high.  You are older than 72 years of age.  You are at high risk for heart disease. Cancer screening Lung Cancer  Lung cancer screening is recommended for adults 51-32 years old who are at high risk for lung cancer because of a history of smoking.  A yearly low-dose CT scan of the lungs is recommended for people who:  Currently smoke.  Have quit within the past 15 years.  Have at least a 30-pack-year history of smoking. A pack year is smoking an average of one pack of cigarettes a day for 1 year.  Yearly screening should continue until it has been 15 years since you quit.  Yearly screening should stop if you develop a health problem that would prevent you from having lung cancer treatment. Breast Cancer  Practice breast self-awareness. This means understanding how your breasts normally appear and feel.  It also means doing regular breast self-exams. Let your health care provider know about any changes, no matter how  small.  If you are in your 20s or 30s, you should have a clinical breast exam (CBE) by a health care provider every 1-3 years as part of a regular health exam.  If you are 30 or older, have a CBE every year. Also consider having a breast X-ray (mammogram) every year.  If you have a family history of breast cancer, talk to your health care provider about genetic screening.  If you are at high risk for breast cancer, talk to your health care provider about having an MRI and a mammogram every year.  Breast cancer gene (BRCA) assessment is recommended for women who have family members with BRCA-related cancers. BRCA-related cancers include:  Breast.  Ovarian.  Tubal.  Peritoneal cancers.  Results of the assessment will determine the need for genetic counseling and BRCA1 and BRCA2 testing. Cervical Cancer  Your health care provider may recommend that you be screened regularly for cancer of the pelvic organs (ovaries, uterus, and vagina). This screening involves a pelvic examination, including checking for microscopic changes to the surface of your cervix (Pap test). You may be encouraged to have this screening done every 3 years, beginning at age 54.  For women ages 7-65, health care providers may recommend pelvic exams and Pap testing every 3 years, or they may recommend the Pap and pelvic exam, combined with testing for human papilloma virus (HPV), every 5 years. Some types of HPV increase your risk of cervical cancer. Testing for HPV may also be done on women of any age with unclear Pap test results.  Other health care providers may not recommend any screening for nonpregnant women who are considered low risk for pelvic cancer and who do not have symptoms. Ask your health care provider if a screening pelvic exam is right for you.  If you have had past treatment for cervical cancer or a condition that could lead to cancer, you need Pap tests and screening for cancer for at least 20 years  after your treatment. If Pap tests have  been discontinued, your risk factors (such as having a new sexual partner) need to be reassessed to determine if screening should resume. Some women have medical problems that increase the chance of getting cervical cancer. In these cases, your health care provider may recommend more frequent screening and Pap tests. Colorectal Cancer  This type of cancer can be detected and often prevented.  Routine colorectal cancer screening usually begins at 72 years of age and continues through 72 years of age.  Your health care provider may recommend screening at an earlier age if you have risk factors for colon cancer.  Your health care provider may also recommend using home test kits to check for hidden blood in the stool.  A small camera at the end of a tube can be used to examine your colon directly (sigmoidoscopy or colonoscopy). This is done to check for the earliest forms of colorectal cancer.  Routine screening usually begins at age 74.  Direct examination of the colon should be repeated every 5-10 years through 72 years of age. However, you may need to be screened more often if early forms of precancerous polyps or small growths are found. Skin Cancer  Check your skin from head to toe regularly.  Tell your health care provider about any new moles or changes in moles, especially if there is a change in a mole's shape or color.  Also tell your health care provider if you have a mole that is larger than the size of a pencil eraser.  Always use sunscreen. Apply sunscreen liberally and repeatedly throughout the day.  Protect yourself by wearing long sleeves, pants, a wide-brimmed hat, and sunglasses whenever you are outside. Heart disease, diabetes, and high blood pressure  High blood pressure causes heart disease and increases the risk of stroke. High blood pressure is more likely to develop in:  People who have blood pressure in the high end of the  normal range (130-139/85-89 mm Hg).  People who are overweight or obese.  People who are African American.  If you are 31-21 years of age, have your blood pressure checked every 3-5 years. If you are 46 years of age or older, have your blood pressure checked every year. You should have your blood pressure measured twice-once when you are at a hospital or clinic, and once when you are not at a hospital or clinic. Record the average of the two measurements. To check your blood pressure when you are not at a hospital or clinic, you can use:  An automated blood pressure machine at a pharmacy.  A home blood pressure monitor.  If you are between 20 years and 58 years old, ask your health care provider if you should take aspirin to prevent strokes.  Have regular diabetes screenings. This involves taking a blood sample to check your fasting blood sugar level.  If you are at a normal weight and have a low risk for diabetes, have this test once every three years after 72 years of age.  If you are overweight and have a high risk for diabetes, consider being tested at a younger age or more often. Preventing infection Hepatitis B  If you have a higher risk for hepatitis B, you should be screened for this virus. You are considered at high risk for hepatitis B if:  You were born in a country where hepatitis B is common. Ask your health care provider which countries are considered high risk.  Your parents were born in a high-risk country,  and you have not been immunized against hepatitis B (hepatitis B vaccine).  You have HIV or AIDS.  You use needles to inject street drugs.  You live with someone who has hepatitis B.  You have had sex with someone who has hepatitis B.  You get hemodialysis treatment.  You take certain medicines for conditions, including cancer, organ transplantation, and autoimmune conditions. Hepatitis C  Blood testing is recommended for:  Everyone born from [birth year] through  1966.  Anyone with known risk factors for hepatitis C. Sexually transmitted infections (STIs)  You should be screened for sexually transmitted infections (STIs) including gonorrhea and chlamydia if:  You are sexually active and are younger than 72 years of age.  You are older than 72 years of age and your health care provider tells you that you are at risk for this type of infection.  Your sexual activity has changed since you were last screened and you are at an increased risk for chlamydia or gonorrhea. Ask your health care provider if you are at risk.  If you do not have HIV, but are at risk, it may be recommended that you take a prescription medicine daily to prevent HIV infection. This is called pre-exposure prophylaxis (PrEP). You are considered at risk if:  You are sexually active and do not regularly use condoms or know the HIV status of your partner(s).  You take drugs by injection.  You are sexually active with a partner who has HIV. Talk with your health care provider about whether you are at high risk of being infected with HIV. If you choose to begin PrEP, you should first be tested for HIV. You should then be tested every 3 months for as long as you are taking PrEP. Pregnancy  If you are premenopausal and you may become pregnant, ask your health care provider about preconception counseling.  If you may become pregnant, take 400 to 800 micrograms (mcg) of folic acid every day.  If you want to prevent pregnancy, talk to your health care provider about birth control (contraception). Osteoporosis and menopause  Osteoporosis is a disease in which the bones lose minerals and strength with aging. This can result in serious bone fractures. Your risk for osteoporosis can be identified using a bone density scan.  If you are 59 years of age or older, or if you are at risk for osteoporosis and fractures, ask your health care provider if you should be screened.  Ask your health  care provider whether you should take a calcium or vitamin D supplement to lower your risk for osteoporosis.  Menopause may have certain physical symptoms and risks.  Hormone replacement therapy may reduce some of these symptoms and risks. Talk to your health care provider about whether hormone replacement therapy is right for you. Follow these instructions at home:  Schedule regular health, dental, and eye exams.  Stay current with your immunizations.  Do not use any tobacco products including cigarettes, chewing tobacco, or electronic cigarettes.  If you are pregnant, do not drink alcohol.  If you are breastfeeding, limit how much and how often you drink alcohol.  Limit alcohol intake to no more than 1 drink per day for nonpregnant women. One drink equals 12 ounces of beer, 5 ounces of wine, or 1 ounces of hard liquor.  Do not use street drugs.  Do not share needles.  Ask your health care provider for help if you need support or information about quitting drugs.  Tell your health  care provider if you often feel depressed.  Tell your health care provider if you have ever been abused or do not feel safe at home. This information is not intended to replace advice given to you by your health care provider. Make sure you discuss any questions you have with your health care provider. Document Released: 06/29/2011 Document Revised: 05/21/2016 Document Reviewed: 09/17/2015 Elsevier Interactive Patient Education  2017 [employer].

## 2017-04-13 LAB — HEPATITIS C ANTIBODY: HCV Ab: NEGATIVE

## 2017-04-13 NOTE — Progress Notes (Signed)
AWV reviewed and agree.  Signed:  Crissie Sickles, MD           04/13/2017

## 2017-04-19 ENCOUNTER — Other Ambulatory Visit: Payer: Self-pay | Admitting: *Deleted

## 2017-04-19 MED ORDER — MONTELUKAST SODIUM 10 MG PO TABS: 10.0000 mg | ORAL_TABLET | Freq: Every day | ORAL | 3 refills | Status: DC

## 2017-04-19 NOTE — Telephone Encounter (Signed)
[facility] [address] [address] Requesting 90 day supply  RF request for montelukast LOV: 03/18/17 Next ov: 07/19/17 Last written: 03/18/17 #30 w/ 3RF

## 2017-05-11 ENCOUNTER — Other Ambulatory Visit: Payer: Self-pay | Admitting: Gastroenterology

## 2017-05-14 ENCOUNTER — Telehealth: Payer: Self-pay | Admitting: *Deleted

## 2017-05-14 NOTE — Telephone Encounter (Signed)
SW Lenh at [facility] and gave her the information listed below. She voiced understanding.

## 2017-05-14 NOTE — Telephone Encounter (Signed)
Pt called stating that [facility] will not fill her alprazolam with out authorization from her provider.   SW Vicky at [facility], she stated that the pharmacist at [facility] needs to verify that Dr. Anitra Lauth is aware pt is taking both oxycodone and alprazolam. They also need supporting documentation: Dx's and medications tried and failed.   I have printed her [facility] report, this has been put on Dr. Idelle Leech desk for review.   Please advise. Thanks.

## 2017-05-14 NOTE — Telephone Encounter (Signed)
Since I have never been asked to do this before, can we simply tell them on the phone that I am aware she is taking oxycodone and alprazolam?  I routinely follow her up in office for anxiety and alprazolam use, and she sees a pain mgmt MD for her oxycodone.  She was already on alprazolam when she became my patient, and I have no information about what was tried and failed.  Let me know.-thx

## 2017-05-18 ENCOUNTER — Encounter: Payer: Self-pay | Admitting: Gastroenterology

## 2017-06-04 DIAGNOSIS — Z1231 Encounter for screening mammogram for malignant neoplasm of breast: Secondary | ICD-10-CM | POA: Diagnosis not present

## 2017-06-04 DIAGNOSIS — Z803 Family history of malignant neoplasm of breast: Secondary | ICD-10-CM | POA: Diagnosis not present

## 2017-06-04 LAB — HM MAMMOGRAPHY

## 2017-06-08 ENCOUNTER — Encounter: Payer: Self-pay | Admitting: Family Medicine

## 2017-07-02 DIAGNOSIS — G894 Chronic pain syndrome: Secondary | ICD-10-CM | POA: Diagnosis not present

## 2017-07-02 DIAGNOSIS — M50322 Other cervical disc degeneration at C5-C6 level: Secondary | ICD-10-CM | POA: Diagnosis not present

## 2017-07-02 DIAGNOSIS — M5136 Other intervertebral disc degeneration, lumbar region: Secondary | ICD-10-CM | POA: Diagnosis not present

## 2017-07-02 DIAGNOSIS — M542 Cervicalgia: Secondary | ICD-10-CM | POA: Diagnosis not present

## 2017-07-06 ENCOUNTER — Encounter: Payer: Self-pay | Admitting: Family Medicine

## 2017-07-14 DIAGNOSIS — R5383 Other fatigue: Secondary | ICD-10-CM | POA: Diagnosis not present

## 2017-07-14 DIAGNOSIS — L729 Follicular cyst of the skin and subcutaneous tissue, unspecified: Secondary | ICD-10-CM | POA: Diagnosis not present

## 2017-07-14 DIAGNOSIS — M519 Unspecified thoracic, thoracolumbar and lumbosacral intervertebral disc disorder: Secondary | ICD-10-CM | POA: Diagnosis not present

## 2017-07-14 DIAGNOSIS — R55 Syncope and collapse: Secondary | ICD-10-CM | POA: Diagnosis not present

## 2017-07-16 DIAGNOSIS — R55 Syncope and collapse: Secondary | ICD-10-CM | POA: Diagnosis not present

## 2017-07-19 ENCOUNTER — Ambulatory Visit: Payer: Self-pay | Admitting: Family Medicine

## 2017-07-19 DIAGNOSIS — R55 Syncope and collapse: Secondary | ICD-10-CM | POA: Diagnosis not present

## 2017-07-19 DIAGNOSIS — I340 Nonrheumatic mitral (valve) insufficiency: Secondary | ICD-10-CM | POA: Diagnosis not present

## 2017-07-19 DIAGNOSIS — R011 Cardiac murmur, unspecified: Secondary | ICD-10-CM | POA: Diagnosis not present

## 2017-07-21 DIAGNOSIS — R51 Headache: Secondary | ICD-10-CM | POA: Diagnosis not present

## 2017-07-22 DIAGNOSIS — I6523 Occlusion and stenosis of bilateral carotid arteries: Secondary | ICD-10-CM | POA: Diagnosis not present

## 2017-07-22 DIAGNOSIS — R55 Syncope and collapse: Secondary | ICD-10-CM | POA: Diagnosis not present

## 2017-07-22 DIAGNOSIS — R0602 Shortness of breath: Secondary | ICD-10-CM | POA: Diagnosis not present

## 2017-07-28 DIAGNOSIS — D539 Nutritional anemia, unspecified: Secondary | ICD-10-CM | POA: Diagnosis not present

## 2017-07-28 DIAGNOSIS — M519 Unspecified thoracic, thoracolumbar and lumbosacral intervertebral disc disorder: Secondary | ICD-10-CM | POA: Diagnosis not present

## 2017-07-28 DIAGNOSIS — R55 Syncope and collapse: Secondary | ICD-10-CM | POA: Diagnosis not present

## 2017-07-28 DIAGNOSIS — Z79899 Other long term (current) drug therapy: Secondary | ICD-10-CM | POA: Diagnosis not present

## 2017-07-28 DIAGNOSIS — Z96611 Presence of right artificial shoulder joint: Secondary | ICD-10-CM | POA: Diagnosis not present

## 2017-07-28 DIAGNOSIS — R011 Cardiac murmur, unspecified: Secondary | ICD-10-CM | POA: Diagnosis not present

## 2017-07-28 DIAGNOSIS — E559 Vitamin D deficiency, unspecified: Secondary | ICD-10-CM | POA: Diagnosis not present

## 2017-08-02 ENCOUNTER — Ambulatory Visit: Payer: Self-pay | Admitting: Family Medicine

## 2017-08-04 ENCOUNTER — Other Ambulatory Visit: Payer: Self-pay | Admitting: *Deleted

## 2017-08-04 MED ORDER — ALPRAZOLAM 2 MG PO TABS
ORAL_TABLET | ORAL | 1 refills | Status: DC
Start: 2017-08-04 — End: 2020-06-21

## 2017-08-04 NOTE — Telephone Encounter (Signed)
Rx faxed

## 2017-08-04 NOTE — Telephone Encounter (Signed)
[facility]  RF request for alprazolam LOV: 03/18/17 Next ov: 04/15/18 Last written: 02/16/17 #180 w/ 1RF  Please advise. Thanks.

## 2017-09-03 HISTORY — PX: CT SCAN: SHX5351

## 2017-11-03 ENCOUNTER — Other Ambulatory Visit: Payer: Self-pay | Admitting: Orthopedic Surgery

## 2017-11-03 DIAGNOSIS — Z96611 Presence of right artificial shoulder joint: Secondary | ICD-10-CM

## 2017-11-03 DIAGNOSIS — M25511 Pain in right shoulder: Secondary | ICD-10-CM

## 2017-11-08 ENCOUNTER — Ambulatory Visit
Admission: RE | Admit: 2017-11-08 | Discharge: 2017-11-08 | Disposition: A | Discharge status: Home / Self Care | Payer: Medicare Other | Source: Ambulatory Visit | Attending: Orthopedic Surgery | Admitting: Orthopedic Surgery

## 2017-11-08 DIAGNOSIS — M25511 Pain in right shoulder: Secondary | ICD-10-CM

## 2017-11-08 DIAGNOSIS — Z96611 Presence of right artificial shoulder joint: Secondary | ICD-10-CM

## 2018-01-03 DIAGNOSIS — M542 Cervicalgia: Secondary | ICD-10-CM | POA: Diagnosis not present

## 2018-01-03 DIAGNOSIS — Z79899 Other long term (current) drug therapy: Secondary | ICD-10-CM | POA: Diagnosis not present

## 2018-01-03 DIAGNOSIS — M545 Low back pain: Secondary | ICD-10-CM | POA: Diagnosis not present

## 2018-01-03 DIAGNOSIS — G8929 Other chronic pain: Secondary | ICD-10-CM | POA: Insufficient documentation

## 2018-01-03 DIAGNOSIS — M25551 Pain in right hip: Secondary | ICD-10-CM | POA: Diagnosis not present

## 2018-01-03 DIAGNOSIS — G894 Chronic pain syndrome: Secondary | ICD-10-CM | POA: Diagnosis not present

## 2018-01-21 DIAGNOSIS — I6523 Occlusion and stenosis of bilateral carotid arteries: Secondary | ICD-10-CM | POA: Diagnosis not present

## 2018-02-04 DIAGNOSIS — M25512 Pain in left shoulder: Secondary | ICD-10-CM | POA: Diagnosis not present

## 2018-02-09 ENCOUNTER — Other Ambulatory Visit: Payer: Self-pay | Admitting: Family Medicine

## 2018-02-09 DIAGNOSIS — M1611 Unilateral primary osteoarthritis, right hip: Secondary | ICD-10-CM | POA: Diagnosis not present

## 2018-02-09 DIAGNOSIS — Z96642 Presence of left artificial hip joint: Secondary | ICD-10-CM | POA: Diagnosis not present

## 2018-02-09 DIAGNOSIS — M25551 Pain in right hip: Secondary | ICD-10-CM | POA: Diagnosis not present

## 2018-02-09 NOTE — Telephone Encounter (Signed)
Patient requesting rf of sertraline, omeprazole, levothyoxine, an trazodone.    Last OV was 03/14/17 but also had Medicare well visit w/ Maudie Mercury 04/12/17.  Next OV is 04/15/18.  Please advise rf's.

## 2018-02-14 ENCOUNTER — Other Ambulatory Visit: Payer: Self-pay

## 2018-02-14 NOTE — Patient Outreach (Signed)
[facility]) Care Management  02/14/2018  April Brewer 15-Apr-1946 [id number]   Telephone Screen  Referral Date: 02/14/18 Referral Source: Nurse Call Center Report Referral Reason: "the patient has laryngitis and they are coughing(productive), nose is stuffy, has headache, no fever" Insurance: [facility] Medicare   Outreach attempt # 1 to patient. No answer at present and RN CM unable to leave voicemail message.     Plan: RN CM will make outreach attempt to patient within one business day if no return call from patient.   Enzo Montgomery, RN,BSN,CCM [place] Management Telephonic Care Management Coordinator Direct Phone: [phone] Toll Free: [phone] Fax: [phone]

## 2018-02-15 ENCOUNTER — Other Ambulatory Visit: Payer: Self-pay

## 2018-02-15 NOTE — Patient Outreach (Signed)
[facility]) Care Management  02/15/2018  April Brewer 1946/08/18 [id number]   Telephone Screen  Referral Date: 02/14/18 Referral Source: Nurse Call Center Report Referral Reason: "the patient has laryngitis and they are coughing(productive), nose is stuffy, has headache, no fever" Insurance: [facility] [employer] message received from patient. Return call placed to patient. She voices that she is getting better and symptoms improving. However, she reports that she still has a productive cough of thick yellowish and greenish sputum. She reports that her cough seems to get worse at night. She voices that she was feeling like she was running a fever and having some heat sweats but voices symptoms are gone. RN CM did confirm with patient that she has received her flu shot for this season. RN CM discussed with patient the need to follow up with PCP and encouraged her to contact office today. She voiced understanding. She reports she has not taken anything OTC or other meds to help treat symptoms. She also states that she had laryngitis this weekend and could barely talk but has improved. She voices no further RN CM needs or concerns at this time. Patient advised to call 24 hr Nurse Line number for any future needs, questions or concerns. She voiced understanding and was appreciative of call.    Plan: RN CM will notify [facility] administrative assistant of case status.   Enzo Montgomery, RN,BSN,CCM [place] Management Telephonic Care Management Coordinator Direct Phone: [phone] Toll Free: [phone] Fax: [phone]

## 2018-02-17 DIAGNOSIS — M1611 Unilateral primary osteoarthritis, right hip: Secondary | ICD-10-CM | POA: Diagnosis not present

## 2018-03-09 DIAGNOSIS — Z79899 Other long term (current) drug therapy: Secondary | ICD-10-CM | POA: Diagnosis not present

## 2018-03-09 DIAGNOSIS — J018 Other acute sinusitis: Secondary | ICD-10-CM | POA: Diagnosis not present

## 2018-03-09 DIAGNOSIS — Z862 Personal history of diseases of the blood and blood-forming organs and certain disorders involving the immune mechanism: Secondary | ICD-10-CM | POA: Diagnosis not present

## 2018-03-09 DIAGNOSIS — E7800 Pure hypercholesterolemia, unspecified: Secondary | ICD-10-CM | POA: Diagnosis not present

## 2018-03-09 DIAGNOSIS — I6523 Occlusion and stenosis of bilateral carotid arteries: Secondary | ICD-10-CM | POA: Diagnosis not present

## 2018-03-09 DIAGNOSIS — E039 Hypothyroidism, unspecified: Secondary | ICD-10-CM | POA: Diagnosis not present

## 2018-04-15 ENCOUNTER — Ambulatory Visit: Payer: Self-pay

## 2018-04-20 DIAGNOSIS — M25551 Pain in right hip: Secondary | ICD-10-CM | POA: Diagnosis not present

## 2018-04-20 DIAGNOSIS — M545 Low back pain: Secondary | ICD-10-CM | POA: Diagnosis not present

## 2018-04-20 DIAGNOSIS — M542 Cervicalgia: Secondary | ICD-10-CM | POA: Diagnosis not present

## 2018-04-20 DIAGNOSIS — G894 Chronic pain syndrome: Secondary | ICD-10-CM | POA: Diagnosis not present

## 2018-05-04 DIAGNOSIS — H43813 Vitreous degeneration, bilateral: Secondary | ICD-10-CM | POA: Diagnosis not present

## 2018-05-04 DIAGNOSIS — H3540 Unspecified peripheral retinal degeneration: Secondary | ICD-10-CM | POA: Diagnosis not present

## 2018-05-09 ENCOUNTER — Other Ambulatory Visit: Payer: Self-pay

## 2018-05-09 MED ORDER — MONTELUKAST SODIUM 10 MG PO TABS: 10.0000 mg | ORAL_TABLET | Freq: Every day | ORAL | 0 refills | Status: DC

## 2018-05-14 ENCOUNTER — Other Ambulatory Visit: Payer: Self-pay | Admitting: Family Medicine

## 2018-05-16 MED ORDER — MONTELUKAST SODIUM 10 MG PO TABS: 10.0000 mg | ORAL_TABLET | Freq: Every day | ORAL | 1 refills | Status: DC

## 2018-05-16 NOTE — Telephone Encounter (Signed)
First request failed to send. I resent Rx.

## 2018-05-16 NOTE — Addendum Note (Signed)
Addended by: Onalee Hua on: 05/16/2018 11:38 AM   Modules accepted: Orders

## 2018-06-06 DIAGNOSIS — H04129 Dry eye syndrome of unspecified lacrimal gland: Secondary | ICD-10-CM | POA: Diagnosis not present

## 2018-06-15 DIAGNOSIS — E7800 Pure hypercholesterolemia, unspecified: Secondary | ICD-10-CM | POA: Diagnosis not present

## 2018-06-15 DIAGNOSIS — E039 Hypothyroidism, unspecified: Secondary | ICD-10-CM | POA: Diagnosis not present

## 2018-06-15 DIAGNOSIS — F411 Generalized anxiety disorder: Secondary | ICD-10-CM | POA: Diagnosis not present

## 2018-06-15 DIAGNOSIS — Z79899 Other long term (current) drug therapy: Secondary | ICD-10-CM | POA: Diagnosis not present

## 2018-06-16 DIAGNOSIS — Z1231 Encounter for screening mammogram for malignant neoplasm of breast: Secondary | ICD-10-CM | POA: Diagnosis not present

## 2018-06-16 DIAGNOSIS — Z803 Family history of malignant neoplasm of breast: Secondary | ICD-10-CM | POA: Diagnosis not present

## 2018-07-20 DIAGNOSIS — M19011 Primary osteoarthritis, right shoulder: Secondary | ICD-10-CM | POA: Diagnosis not present

## 2018-07-20 DIAGNOSIS — D539 Nutritional anemia, unspecified: Secondary | ICD-10-CM | POA: Diagnosis not present

## 2018-07-20 DIAGNOSIS — F411 Generalized anxiety disorder: Secondary | ICD-10-CM | POA: Diagnosis not present

## 2018-07-20 DIAGNOSIS — M519 Unspecified thoracic, thoracolumbar and lumbosacral intervertebral disc disorder: Secondary | ICD-10-CM | POA: Diagnosis not present

## 2018-08-03 DIAGNOSIS — D649 Anemia, unspecified: Secondary | ICD-10-CM | POA: Diagnosis not present

## 2018-08-03 DIAGNOSIS — M519 Unspecified thoracic, thoracolumbar and lumbosacral intervertebral disc disorder: Secondary | ICD-10-CM | POA: Diagnosis not present

## 2018-08-03 DIAGNOSIS — F411 Generalized anxiety disorder: Secondary | ICD-10-CM | POA: Diagnosis not present

## 2018-08-03 DIAGNOSIS — M19011 Primary osteoarthritis, right shoulder: Secondary | ICD-10-CM | POA: Diagnosis not present

## 2018-08-08 ENCOUNTER — Other Ambulatory Visit: Payer: Self-pay | Admitting: *Deleted

## 2018-08-08 NOTE — Patient Outreach (Addendum)
[facility]) Care Management  08/08/2018  April Brewer 04-May-1946 [id number]   Member identified as high risk according to [employer] health questionnaire.  Call placed to introduce [facility] care management services and perform telephone screening.  This care manager introduced self and purpose of call.  Screening complete, member denies any needs at this time, will not open case.  Valente David, [place], MSN [place] [phone]

## 2018-08-10 DIAGNOSIS — M1611 Unilateral primary osteoarthritis, right hip: Secondary | ICD-10-CM | POA: Diagnosis not present

## 2018-08-17 DIAGNOSIS — M1611 Unilateral primary osteoarthritis, right hip: Secondary | ICD-10-CM | POA: Diagnosis not present

## 2018-08-17 DIAGNOSIS — M25551 Pain in right hip: Secondary | ICD-10-CM | POA: Diagnosis not present

## 2018-08-17 DIAGNOSIS — M545 Low back pain: Secondary | ICD-10-CM | POA: Diagnosis not present

## 2018-08-17 DIAGNOSIS — Z79899 Other long term (current) drug therapy: Secondary | ICD-10-CM | POA: Diagnosis not present

## 2018-08-24 ENCOUNTER — Encounter (HOSPITAL_COMMUNITY): Payer: Self-pay

## 2018-08-24 ENCOUNTER — Other Ambulatory Visit: Payer: Self-pay

## 2018-08-24 ENCOUNTER — Encounter (HOSPITAL_COMMUNITY)
Admission: RE | Admit: 2018-08-24 | Discharge: 2018-08-24 | Disposition: A | Discharge status: Home / Self Care | Payer: PPO | Source: Ambulatory Visit | Attending: Orthopedic Surgery | Admitting: Orthopedic Surgery

## 2018-08-24 DIAGNOSIS — Z01810 Encounter for preprocedural cardiovascular examination: Secondary | ICD-10-CM | POA: Diagnosis not present

## 2018-08-24 DIAGNOSIS — Z01812 Encounter for preprocedural laboratory examination: Secondary | ICD-10-CM | POA: Diagnosis not present

## 2018-08-24 DIAGNOSIS — M1611 Unilateral primary osteoarthritis, right hip: Secondary | ICD-10-CM | POA: Diagnosis not present

## 2018-08-24 DIAGNOSIS — I10 Essential (primary) hypertension: Secondary | ICD-10-CM | POA: Diagnosis not present

## 2018-08-24 LAB — BASIC METABOLIC PANEL
ANION GAP: 8 (ref 5–15)
BUN: 18 mg/dL (ref 8–23)
CO2: 27 mmol/L (ref 22–32)
CREATININE: 0.84 mg/dL (ref 0.44–1.00)
Calcium: 9.3 mg/dL (ref 8.9–10.3)
Chloride: 99 mmol/L (ref 98–111)
GFR calc Af Amer: >60 mL/min (ref >60)
GLUCOSE: 95 mg/dL (ref 70–99)
Potassium: 4.3 mmol/L (ref 3.5–5.1)
Sodium: 134 mmol/L — ABNORMAL LOW (ref 135–145)

## 2018-08-24 LAB — CBC
HCT: 30.9 % — ABNORMAL LOW (ref 36.0–46.0)
Hemoglobin: 10.6 g/dL — ABNORMAL LOW (ref 12.0–15.0)
MCH: 32.1 pg (ref 26.0–34.0)
MCHC: 34.3 g/dL (ref 30.0–36.0)
MCV: 93.6 fL (ref 78.0–100.0)
PLATELETS: 234 10*3/uL (ref 150–400)
RBC: 3.3 MIL/uL — ABNORMAL LOW (ref 3.87–5.11)
RDW: 14.4 % (ref 11.5–15.5)
WBC: 7.7 10*3/uL (ref 4.0–10.5)

## 2018-08-24 LAB — SURGICAL PCR SCREEN
MRSA, PCR: NEGATIVE
STAPHYLOCOCCUS AUREUS: POSITIVE — AB

## 2018-08-24 NOTE — Patient Instructions (Addendum)
April Brewer  08/24/2018   Your procedure is scheduled on: 08-30-18   Report to [facility] Main  Entrance    Report to Admitting at 7:35 AM    Call this number if you have problems the morning of surgery [phone]   Remember: Do not eat food or drink liquids :After Midnight.     Take these medicines the morning of surgery with A SIP OF WATER: Alprazolam (Xanax), Levothyroxine (Synthroid). You may also use your pain medication as needed.                                 You may not have any metal on your body including hair pins and              piercings  Do not wear jewelry, make-up, lotions, powders or perfumes, deodorant             Do not wear nail polish.  Do not shave  48 hours prior to surgery.     Do not bring valuables to the hospital. [place].  Contacts, dentures or bridgework may not be worn into surgery.  Leave suitcase in the car. After surgery it may be brought to your room.       Special Instructions: N/A              Please read over the following fact sheets you were given: _____________________________________________________________________         [facility] - Preparing for Surgery Before surgery, you can play an important role.  Because skin is not sterile, your skin needs to be as free of germs as possible.  You can reduce the number of germs on your skin by washing with CHG (chlorahexidine gluconate) soap before surgery.  CHG is an antiseptic cleaner which kills germs and bonds with the skin to continue killing germs even after washing. Please DO NOT use if you have an allergy to CHG or antibacterial soaps.  If your skin becomes reddened/irritated stop using the CHG and inform your nurse when you arrive at Short Stay. Do not shave (including legs and underarms) for at least 48 hours prior to the first CHG shower.  You may shave your face/neck. Please follow these instructions  carefully:  1.  Shower with CHG Soap the night before surgery and the  morning of Surgery.  2.  If you choose to wash your hair, wash your hair first as usual with your  normal  shampoo.  3.  After you shampoo, rinse your hair and body thoroughly to remove the  shampoo.                           4.  Use CHG as you would any other liquid soap.  You can apply chg directly  to the skin and wash                       Gently with a scrungie or clean washcloth.  5.  Apply the CHG Soap to your body ONLY FROM THE NECK DOWN.   Do not use on face/ open  Wound or open sores. Avoid contact with eyes, ears mouth and genitals (private parts).                       Wash face,  Genitals (private parts) with your normal soap.             6.  Wash thoroughly, paying special attention to the area where your surgery  will be performed.  7.  Thoroughly rinse your body with warm water from the neck down.  8.  DO NOT shower/wash with your normal soap after using and rinsing off  the CHG Soap.                9.  Pat yourself dry with a clean towel.            10.  Wear clean pajamas.            11.  Place clean sheets on your bed the night of your first shower and do not  sleep with pets. Day of Surgery : Do not apply any lotions/deodorants the morning of surgery.  Please wear clean clothes to the hospital/surgery center.  FAILURE TO FOLLOW THESE INSTRUCTIONS MAY RESULT IN THE CANCELLATION OF YOUR SURGERY PATIENT SIGNATURE_________________________________  NURSE SIGNATURE__________________________________  ________________________________________________________________________   Adam Phenix  An incentive spirometer is a tool that can help keep your lungs clear and active. This tool measures how well you are filling your lungs with each breath. Taking long deep breaths may help reverse or decrease the chance of developing breathing (pulmonary) problems (especially infection)  following:  A long period of time when you are unable to move or be active. BEFORE THE PROCEDURE   If the spirometer includes an indicator to show your best effort, your nurse or respiratory therapist will set it to a desired goal.  If possible, sit up straight or lean slightly forward. Try not to slouch.  Hold the incentive spirometer in an upright position. INSTRUCTIONS FOR USE  1. Sit on the edge of your bed if possible, or sit up as far as you can in bed or on a chair. 2. Hold the incentive spirometer in an upright position. 3. Breathe out normally. 4. Place the mouthpiece in your mouth and seal your lips tightly around it. 5. Breathe in slowly and as deeply as possible, raising the piston or the ball toward the top of the column. 6. Hold your breath for 3-5 seconds or for as long as possible. Allow the piston or ball to fall to the bottom of the column. 7. Remove the mouthpiece from your mouth and breathe out normally. 8. Rest for a few seconds and repeat Steps 1 through 7 at least 10 times every 1-2 hours when you are awake. Take your time and take a few normal breaths between deep breaths. 9. The spirometer may include an indicator to show your best effort. Use the indicator as a goal to work toward during each repetition. 10. After each set of 10 deep breaths, practice coughing to be sure your lungs are clear. If you have an incision (the cut made at the time of surgery), support your incision when coughing by placing a pillow or rolled up towels firmly against it. Once you are able to get out of bed, walk around indoors and cough well. You may stop using the incentive spirometer when instructed by your caregiver.  RISKS AND COMPLICATIONS  Take your time so you do not get  dizzy or light-headed.  If you are in pain, you may need to take or ask for pain medication before doing incentive spirometry. It is harder to take a deep breath if you are having pain. AFTER USE  Rest and  breathe slowly and easily.  It can be helpful to keep track of a log of your progress. Your caregiver can provide you with a simple table to help with this. If you are using the spirometer at home, follow these instructions: [place] IF:   You are having difficultly using the spirometer.  You have trouble using the spirometer as often as instructed.  Your pain medication is not giving enough relief while using the spirometer.  You develop fever of 100.5 F (38.1 C) or higher. SEEK IMMEDIATE MEDICAL CARE IF:   You cough up bloody sputum that had not been present before.  You develop fever of 102 F (38.9 C) or greater.  You develop worsening pain at or near the incision site. MAKE SURE YOU:   Understand these instructions.  Will watch your condition.  Will get help right away if you are not doing well or get worse. Document Released: 04/26/2007 Document Revised: 03/07/2012 Document Reviewed: 06/27/2007 Exit[employer] Patient Information 2014 Exit[employer], [place].   ________________________________________________________________________  WHAT IS A BLOOD TRANSFUSION? Blood Transfusion Information  A transfusion is the replacement of blood or some of its parts. Blood is made up of multiple cells which provide different functions.  Red blood cells carry oxygen and are used for blood loss replacement.  White blood cells fight against infection.  Platelets control bleeding.  Plasma helps clot blood.  Other blood products are available for specialized needs, such as hemophilia or other clotting disorders. BEFORE THE TRANSFUSION  Who gives blood for transfusions?   Healthy volunteers who are fully evaluated to make sure their blood is safe. This is blood bank blood. Transfusion therapy is the safest it has ever been in the practice of medicine. Before blood is taken from a donor, a complete history is taken to make sure that person has no history of diseases nor engages in  risky social behavior (examples are intravenous drug use or sexual activity with multiple partners). The donor's travel history is screened to minimize risk of transmitting infections, such as malaria. The donated blood is tested for signs of infectious diseases, such as HIV and hepatitis. The blood is then tested to be sure it is compatible with you in order to minimize the chance of a transfusion reaction. If you or a relative donates blood, this is often done in anticipation of surgery and is not appropriate for emergency situations. It takes many days to process the donated blood. RISKS AND COMPLICATIONS Although transfusion therapy is very safe and saves many lives, the main dangers of transfusion include:   Getting an infectious disease.  Developing a transfusion reaction. This is an allergic reaction to something in the blood you were given. Every precaution is taken to prevent this. The decision to have a blood transfusion has been considered carefully by your caregiver before blood is given. Blood is not given unless the benefits outweigh the risks. AFTER THE TRANSFUSION  Right after receiving a blood transfusion, you will usually feel much better and more energetic. This is especially true if your red blood cells have gotten low (anemic). The transfusion raises the level of the red blood cells which carry oxygen, and this usually causes an energy increase.  The nurse administering the transfusion will  monitor you carefully for complications. HOME CARE INSTRUCTIONS  No special instructions are needed after a transfusion. You may find your energy is better. Speak with your caregiver about any limitations on activity for underlying diseases you may have. SEEK MEDICAL CARE IF:   Your condition is not improving after your transfusion.  You develop redness or irritation at the intravenous (IV) site. SEEK IMMEDIATE MEDICAL CARE IF:  Any of the following symptoms occur over the next 12  hours:  Shaking chills.  You have a temperature by mouth above 102 F (38.9 C), not controlled by medicine.  Chest, back, or muscle pain.  People around you feel you are not acting correctly or are confused.  Shortness of breath or difficulty breathing.  Dizziness and fainting.  You get a rash or develop hives.  You have a decrease in urine output.  Your urine turns a dark color or changes to pink, red, or brown. Any of the following symptoms occur over the next 10 days:  You have a temperature by mouth above 102 F (38.9 C), not controlled by medicine.  Shortness of breath.  Weakness after normal activity.  The white part of the eye turns yellow (jaundice).  You have a decrease in the amount of urine or are urinating less often.  Your urine turns a dark color or changes to pink, red, or brown. Document Released: 12/11/2000 Document Revised: 03/07/2012 Document Reviewed: 07/30/2008 [facility] Patient Information 2014 [place], [place].  _______________________________________________________________________

## 2018-08-29 NOTE — H&P (Signed)
TOTAL HIP ADMISSION H&P  Patient is admitted for right total hip arthroplasty, anterior approach.  Subjective:  Chief Complaint:    Right hip primary OA /pain  HPI: April Hausen, 72 y.o. female, has a history of pain and functional disability in the right hip(s) due to arthritis and patient has failed non-surgical conservative treatments for greater than 12 weeks to include NSAID's and/or analgesics, corticosteriod injections and activity modification.  Onset of symptoms was gradual starting 2+ years ago with gradually worsening course since that time.The patient noted prior procedures of the hip to include arthroplasty on the left hip in 2014 per Dr. Alvan Dame.  Patient currently rates pain in the right hip at 9 out of 10 with activity. Patient has night pain, worsening of pain with activity and weight bearing, trendelenberg gait, pain that interfers with activities of daily living and pain with passive range of motion. Patient has evidence of periarticular osteophytes and joint space narrowing by imaging studies. This condition presents safety issues increasing the risk of falls.  There is no current active infection.  Risks, benefits and expectations were discussed with the patient.  Risks including but not limited to the risk of anesthesia, blood clots, nerve damage, blood vessel damage, failure of the prosthesis, infection and up to and including death.  Patient understand the risks, benefits and expectations and wishes to proceed with surgery.   PCP: Egbert Garibaldi, PA-C  D/C Plans:       Home  Post-op Meds:       No Rx given   Tranexamic Acid:      To be given - IV   Decadron:      Is to be given  FYI:     ASA  Oxy 5-10  DME:   Pt already has equipment   PT:    No PT    Patient Active Problem List   Diagnosis Date Noted  . Normocytic anemia 02/13/2016  . Lymphocytic colitis 03/14/2015  . Dyspnea 01/11/2014  . Venous insufficiency 08/23/2013  . Peripheral edema 08/23/2013  .  Anxiety and depression 08/23/2013  . Hyponatremia 05/19/2013  . Cough, persistent 08/31/2012  . 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 OSTEOPENIA 02/18/2009  . GOITER 06/02/2007  . Hypothyroidism 06/02/2007  . DEPRESSION 06/02/2007  . HYPERTENSION 06/02/2007  . ALLERGIC RHINITIS 06/02/2007  . GERD 06/02/2007  . INSOMNIA 06/02/2007   Past Medical History:  Diagnosis Date  . ALLERGIC RHINITIS 06/02/2007  . Allergy   . Arthritis    DDD of cervical, thoracic, and lumbar spine  . Blood transfusion without reported diagnosis   . Cataract    Bilateral removed cateracts  . Cervical spondylosis    C5-6 and C6-7.  Pain mgmt as per Dr. Nelva Bush.  . Chronic low back pain 02/18/2009   s/p fusion L3-4 through L5-S1.  Pain meds per Dr. Nelva Bush  . DEPRESSION 06/02/2007  . Diverticulosis   . GERD (gastroesophageal reflux disease)   . Heart murmur   . Hiatal hernia   . HYPOTHYROIDISM 06/02/2007   GOITER  . INSOMNIA 06/02/2007  . Lymphocytic colitis   . Muscle spasms of lower extremity   . Nasal septal perforation    chronic; hx of epistaxis  . Normocytic anemia 04/2015   HEME + in ED 08/07/15--endoscopic eval unrevealing.  Iron studies fine.  ?Anemia of chronic dz (lymphocytic colitis?)  . OSTEOPENIA 02/18/2009   Repeat DEXA 07/2014 showed osteoporosis by T score in radius but spine and hip T  scores were normal-continue vit D and calcium and repeat DEXA 08/2016 unchanged.  Repeat DEXA 2 yrs.  . Peripheral edema    LE's, nonpitting    Past Surgical History:  Procedure Laterality Date  . ABDOMINAL HYSTERECTOMY     for DUB (ovaries are still in)  . BACK SURGERY     X 5: fusion of L3-L4 through L5-S1  . CATARACT EXTRACTION Bilateral   . CHOLECYSTECTOMY  2012  . COLONOSCOPY  06/2007; 01/2015   2008 Diverticulosis, o/w normal.  2016 showed lymphocytic colitis with mild diverticulosis: pt was started on oral budesonide at that time.  Marland Kitchen DEXA  08/16/2014; 09/14/16   Hip and spine ok; radius osteoporotic---but meds  not indicated.  Repeat 08/2018  . ESOPHAGOGASTRODUODENOSCOPY  10/2007; 11/27/15   Small hiatal hernia, o/w normal.  . SHOULDER SURGERY Right    \  . TOTAL HIP ARTHROPLASTY  05/31/2012   Procedure: TOTAL HIP ARTHROPLASTY ANTERIOR APPROACH;  Surgeon: Mauri Pole, MD;  Location: [facility];  Service: Orthopedics;  Laterality: Left;    No current facility-administered medications for this encounter.    Current Outpatient Medications  Medication Sig Dispense Refill Last Dose  . alprazolam (XANAX) 2 MG tablet Take one-half to one tablet by mouth twice daily as needed (Patient taking differently: Take 1-2 mg by mouth See admin instructions. Take 1 mg by mouth in the morning and 2 mg by mouth in the evening) 180 tablet 1   . atorvastatin (LIPITOR) 10 MG tablet Take 10 mg by mouth 2 (two) times a week.  1   . docusate sodium (COLACE) 100 MG capsule Take 100 mg by mouth daily as needed for mild constipation.     Marland Kitchen ibuprofen (ADVIL,MOTRIN) 600 MG tablet Take 600 mg by mouth every 8 (eight) hours as needed for moderate pain. Post surgical   Taking  . Iron-FA-B Cmp-C-Biot-Probiotic (FUSION PLUS) CAPS Take 1 capsule by mouth daily.  3   . levothyroxine (SYNTHROID, LEVOTHROID) 75 MCG tablet TAKE 1 TABLET BY MOUTH ONCE DAILY WITH BREAKFAST (Patient taking differently: Take 75 mcg by mouth daily before breakfast. ) 90 tablet 3   . methocarbamol (ROBAXIN) 750 MG tablet Take 750 mg by mouth 3 (three) times daily as needed.  2   . omeprazole (PRILOSEC) 40 MG capsule TAKE 1 CAPSULE BY MOUTH  DAILY (Patient taking differently: Take 40 mg by mouth daily as needed (heartburn). ) 90 capsule 3   . Oxycodone HCl 10 MG TABS Take 10 mg by mouth 3 (three) times daily as needed (pain).   0 Taking  . sertraline (ZOLOFT) 100 MG tablet TAKE ONE AND ONE-HALF  TABLET BY MOUTH NIGHTLY AT  BEDTIME (Patient taking differently: Take 50-100 mg by mouth 2 (two) times daily. ) 135 tablet 3   . traZODone (DESYREL) 50 MG tablet TAKE 4 TABLETS  BY MOUTH  EVERY NIGHT AT BEDTIME AS  NEEDED FOR INSOMNIA (Patient taking differently: Take 200 mg by mouth at bedtime. ) 360 tablet 3 Taking  . triamterene-hydrochlorothiazide (MAXZIDE-25) 37.5-25 MG tablet Take 1 tablet by mouth daily as needed (swelling).   5   . dicyclomine (BENTYL) 10 MG capsule TAKE ONE CAPSULE BY MOUTH  THREE OR FOUR TIMES DAILY  AS NEEDED FOR CRAMPING/PAIN (Patient not taking: Reported on 08/17/2018) 270 capsule 0 Not Taking at Unknown time  . montelukast (SINGULAIR) 10 MG tablet Take 1 tablet (10 mg total) by mouth at bedtime. Patient needs follow up visit for further refills (Patient  not taking: Reported on 08/17/2018) 90 tablet 1 Not Taking at Unknown time   Allergies  Allergen Reactions  . Latex Rash    Social History   Tobacco Use  . Smoking status: Never Smoker  . Smokeless tobacco: Never Used  Substance Use Topics  . Alcohol use: No    Alcohol/week: 0.0 standard drinks    Family History  Problem Relation Age of Onset  . Arthritis Mother   . Stroke Mother   . Breast cancer Mother   . Diabetes Mother   . Colon cancer Father   . Heart disease Father   . Heart disease Sister   . Breast cancer Sister   . Lung cancer Brother        smoked  . Uterine cancer Sister   . Esophageal cancer Neg Hx   . Rectal cancer Neg Hx   . Stomach cancer Neg Hx      Review of Systems  Constitutional: Negative.   HENT: Negative.   Eyes: Negative.   Respiratory: Negative.   Cardiovascular: Negative.   Gastrointestinal: Positive for heartburn.  Genitourinary: Negative.   Musculoskeletal: Positive for joint pain.  Skin: Negative.   Neurological: Negative.   Endo/Heme/Allergies: Positive for environmental allergies.  Psychiatric/Behavioral: Positive for depression. The patient is nervous/anxious and has insomnia.     Objective:  Physical Exam  Constitutional: She is oriented to person, place, and time. She appears well-developed.  HENT:  Head: Normocephalic.   Eyes: Pupils are equal, round, and reactive to light.  Neck: Neck supple. No JVD present. No tracheal deviation present. No thyromegaly present.  Cardiovascular: Normal rate, regular rhythm and intact distal pulses.  Murmur heard. Respiratory: Effort normal and breath sounds normal. No respiratory distress. She has no wheezes.  GI: Soft. There is no tenderness. There is no guarding.  Musculoskeletal:       Right hip: She exhibits decreased range of motion, decreased strength, tenderness and bony tenderness. She exhibits no swelling, no deformity and no laceration.  Lymphadenopathy:    She has no cervical adenopathy.  Neurological: She is alert and oriented to person, place, and time. A sensory deficit (numbness and tingling in bilateral feet) is present.  Skin: Skin is warm and dry.  Psychiatric: She has a normal mood and affect.      Labs:  Estimated body mass index is 23.43 kg/m as calculated from the following:   Height as of 08/24/18: 5\' 4"  (1.626 m).   Weight as of 08/24/18: 61.9 kg.   Imaging Review Plain radiographs demonstrate severe degenerative joint disease of the right hip(s). The bone quality appears to be good for age and reported activity level.    Preoperative templating of the joint replacement has been completed, documented, and submitted to the Operating Room personnel in order to optimize intra-operative equipment management.     Assessment/Plan:  End stage arthritis, right hip(s)  The patient history, physical examination, clinical judgement of the provider and imaging studies are consistent with end stage degenerative joint disease of the right hip(s) and total hip arthroplasty is deemed medically necessary. The treatment options including medical management, injection therapy, arthroscopy and arthroplasty were discussed at length. The risks and benefits of total hip arthroplasty were presented and reviewed. The risks due to aseptic loosening, infection,  stiffness, dislocation/subluxation,  thromboembolic complications and other imponderables were discussed.  The patient acknowledged the explanation, agreed to proceed with the plan and consent was signed. Patient is being admitted for inpatient treatment for surgery,  pain control, PT, OT, prophylactic antibiotics, VTE prophylaxis, progressive ambulation and ADL's and discharge planning.The patient is planning to be discharged home.    [place] Pugh Emmerich Cryer   PA-C  08/29/2018, 9:47 PM

## 2018-08-30 ENCOUNTER — Other Ambulatory Visit: Payer: Self-pay

## 2018-08-30 ENCOUNTER — Telehealth (HOSPITAL_COMMUNITY): Payer: Self-pay | Admitting: *Deleted

## 2018-08-30 ENCOUNTER — Encounter (HOSPITAL_COMMUNITY): Payer: Self-pay | Admitting: General Practice

## 2018-08-30 ENCOUNTER — Encounter (HOSPITAL_COMMUNITY)
Admission: RE | Disposition: A | Discharge status: Home / Self Care | Payer: Self-pay | Source: Home / Self Care | Attending: Orthopedic Surgery

## 2018-08-30 ENCOUNTER — Inpatient Hospital Stay (HOSPITAL_COMMUNITY): Payer: PPO

## 2018-08-30 ENCOUNTER — Inpatient Hospital Stay (HOSPITAL_COMMUNITY): Payer: PPO | Admitting: Anesthesiology

## 2018-08-30 ENCOUNTER — Inpatient Hospital Stay (HOSPITAL_COMMUNITY)
Admission: RE | Admit: 2018-08-30 | Discharge: 2018-08-31 | DRG: 470 | Disposition: A | Discharge status: Home / Self Care | Payer: PPO | Attending: Orthopedic Surgery | Admitting: Orthopedic Surgery

## 2018-08-30 DIAGNOSIS — Z96642 Presence of left artificial hip joint: Secondary | ICD-10-CM | POA: Diagnosis not present

## 2018-08-30 DIAGNOSIS — F329 Major depressive disorder, single episode, unspecified: Secondary | ICD-10-CM | POA: Diagnosis present

## 2018-08-30 DIAGNOSIS — M1611 Unilateral primary osteoarthritis, right hip: Secondary | ICD-10-CM | POA: Diagnosis not present

## 2018-08-30 DIAGNOSIS — K219 Gastro-esophageal reflux disease without esophagitis: Secondary | ICD-10-CM | POA: Diagnosis present

## 2018-08-30 DIAGNOSIS — Z885 Allergy status to narcotic agent status: Secondary | ICD-10-CM | POA: Diagnosis not present

## 2018-08-30 DIAGNOSIS — E039 Hypothyroidism, unspecified: Secondary | ICD-10-CM | POA: Diagnosis present

## 2018-08-30 DIAGNOSIS — Z79899 Other long term (current) drug therapy: Secondary | ICD-10-CM

## 2018-08-30 DIAGNOSIS — K449 Diaphragmatic hernia without obstruction or gangrene: Secondary | ICD-10-CM | POA: Diagnosis not present

## 2018-08-30 DIAGNOSIS — Z79890 Hormone replacement therapy (postmenopausal): Secondary | ICD-10-CM

## 2018-08-30 DIAGNOSIS — I10 Essential (primary) hypertension: Secondary | ICD-10-CM | POA: Diagnosis not present

## 2018-08-30 DIAGNOSIS — G4700 Insomnia, unspecified: Secondary | ICD-10-CM | POA: Diagnosis not present

## 2018-08-30 DIAGNOSIS — M810 Age-related osteoporosis without current pathological fracture: Secondary | ICD-10-CM | POA: Diagnosis present

## 2018-08-30 DIAGNOSIS — Z96649 Presence of unspecified artificial hip joint: Secondary | ICD-10-CM

## 2018-08-30 DIAGNOSIS — Z96641 Presence of right artificial hip joint: Secondary | ICD-10-CM

## 2018-08-30 DIAGNOSIS — F419 Anxiety disorder, unspecified: Secondary | ICD-10-CM | POA: Diagnosis present

## 2018-08-30 DIAGNOSIS — Z471 Aftercare following joint replacement surgery: Secondary | ICD-10-CM | POA: Diagnosis not present

## 2018-08-30 DIAGNOSIS — Z91040 Latex allergy status: Secondary | ICD-10-CM

## 2018-08-30 DIAGNOSIS — M25551 Pain in right hip: Secondary | ICD-10-CM | POA: Diagnosis present

## 2018-08-30 HISTORY — PX: TOTAL HIP ARTHROPLASTY: SHX124

## 2018-08-30 LAB — TYPE AND SCREEN
ABO/RH(D): O POS
ANTIBODY SCREEN: NEGATIVE

## 2018-08-30 SURGERY — ARTHROPLASTY, HIP, TOTAL, ANTERIOR APPROACH
Anesthesia: General | Site: Hip | Laterality: Right

## 2018-08-30 MED ORDER — OXYCODONE HCL 5 MG PO TABS
5.0000 mg | ORAL_TABLET | ORAL | Status: DC | PRN
  Administered 2018-08-30: 5 mg via ORAL

## 2018-08-30 MED ORDER — HYDROMORPHONE HCL 1 MG/ML IJ SOLN: 0.5000 mg | INTRAMUSCULAR | Status: DC | PRN

## 2018-08-30 MED ORDER — PROMETHAZINE HCL 25 MG/ML IJ SOLN: 6.2500 mg | INTRAMUSCULAR | Status: DC | PRN

## 2018-08-30 MED ORDER — 0.9 % SODIUM CHLORIDE (POUR BTL) OPTIME
TOPICAL | Status: DC | PRN
  Administered 2018-08-30: 1000 mL

## 2018-08-30 MED ORDER — SERTRALINE HCL 50 MG PO TABS
150.0000 mg | ORAL_TABLET | Freq: Every day | ORAL | Status: DC
  Administered 2018-08-30: 150 mg via ORAL
  Filled 2018-08-30: qty 3

## 2018-08-30 MED ORDER — ASPIRIN 81 MG PO CHEW
81.0000 mg | CHEWABLE_TABLET | Freq: Two times a day (BID) | ORAL | Status: DC
  Administered 2018-08-30 – 2018-08-31 (×2): 81 mg via ORAL
  Filled 2018-08-30 (×2): qty 1

## 2018-08-30 MED ORDER — SUGAMMADEX SODIUM 200 MG/2ML IV SOLN
INTRAVENOUS | Status: AC
  Filled 2018-08-30: qty 2

## 2018-08-30 MED ORDER — MAGNESIUM CITRATE PO SOLN: 1.0000 | Freq: Once | ORAL | Status: DC | PRN

## 2018-08-30 MED ORDER — ROCURONIUM BROMIDE 100 MG/10ML IV SOLN
INTRAVENOUS | Status: DC | PRN
  Administered 2018-08-30: 40 mg via INTRAVENOUS
  Administered 2018-08-30 (×2): 10 mg via INTRAVENOUS

## 2018-08-30 MED ORDER — PROPOFOL 10 MG/ML IV BOLUS
INTRAVENOUS | Status: AC
  Filled 2018-08-30: qty 40

## 2018-08-30 MED ORDER — SODIUM CHLORIDE 0.9 % IV SOLN
INTRAVENOUS | Status: DC
  Administered 2018-08-30 – 2018-08-31 (×2): via INTRAVENOUS

## 2018-08-30 MED ORDER — LACTATED RINGERS IV SOLN
INTRAVENOUS | Status: DC | PRN
  Administered 2018-08-30 (×2): via INTRAVENOUS

## 2018-08-30 MED ORDER — PANTOPRAZOLE SODIUM 40 MG PO TBEC: 80.0000 mg | DELAYED_RELEASE_TABLET | Freq: Every day | ORAL | Status: DC | PRN

## 2018-08-30 MED ORDER — TRANEXAMIC ACID 1000 MG/10ML IV SOLN
1000.0000 mg | Freq: Once | INTRAVENOUS | Status: AC
  Administered 2018-08-30: 1000 mg via INTRAVENOUS
  Filled 2018-08-30: qty 1000

## 2018-08-30 MED ORDER — DEXAMETHASONE SODIUM PHOSPHATE 10 MG/ML IJ SOLN
10.0000 mg | Freq: Once | INTRAMUSCULAR | Status: AC
  Administered 2018-08-30: 10 mg via INTRAVENOUS

## 2018-08-30 MED ORDER — CEFAZOLIN SODIUM-DEXTROSE 2-4 GM/100ML-% IV SOLN
2.0000 g | INTRAVENOUS | Status: AC
  Administered 2018-08-30: 2 g via INTRAVENOUS
  Filled 2018-08-30: qty 100

## 2018-08-30 MED ORDER — LIDOCAINE HCL (CARDIAC) PF 100 MG/5ML IV SOSY
PREFILLED_SYRINGE | INTRAVENOUS | Status: DC | PRN
  Administered 2018-08-30: 50 mg via INTRAVENOUS

## 2018-08-30 MED ORDER — SUGAMMADEX SODIUM 200 MG/2ML IV SOLN
INTRAVENOUS | Status: DC | PRN
  Administered 2018-08-30: 125 mg via INTRAVENOUS

## 2018-08-30 MED ORDER — ONDANSETRON HCL 4 MG/2ML IJ SOLN
INTRAMUSCULAR | Status: DC | PRN
  Administered 2018-08-30: 4 mg via INTRAVENOUS

## 2018-08-30 MED ORDER — BISACODYL 10 MG RE SUPP: 10.0000 mg | Freq: Every day | RECTAL | Status: DC | PRN

## 2018-08-30 MED ORDER — POLYETHYLENE GLYCOL 3350 17 G PO PACK
17.0000 g | PACK | Freq: Two times a day (BID) | ORAL | Status: DC
  Administered 2018-08-30 – 2018-08-31 (×2): 17 g via ORAL
  Filled 2018-08-30 (×2): qty 1

## 2018-08-30 MED ORDER — TRAZODONE HCL 100 MG PO TABS
200.0000 mg | ORAL_TABLET | Freq: Every day | ORAL | Status: DC
  Administered 2018-08-30: 200 mg via ORAL
  Filled 2018-08-30: qty 2

## 2018-08-30 MED ORDER — PROPOFOL 10 MG/ML IV BOLUS
INTRAVENOUS | Status: DC | PRN
  Administered 2018-08-30: 100 mg via INTRAVENOUS

## 2018-08-30 MED ORDER — ONDANSETRON HCL 4 MG PO TABS: 4.0000 mg | ORAL_TABLET | Freq: Four times a day (QID) | ORAL | Status: DC | PRN

## 2018-08-30 MED ORDER — HYDROMORPHONE HCL 1 MG/ML IJ SOLN
INTRAMUSCULAR | Status: AC
  Filled 2018-08-30: qty 1

## 2018-08-30 MED ORDER — MEPERIDINE HCL 50 MG/ML IJ SOLN: 6.2500 mg | INTRAMUSCULAR | Status: DC | PRN

## 2018-08-30 MED ORDER — LEVOTHYROXINE SODIUM 75 MCG PO TABS
75.0000 ug | ORAL_TABLET | Freq: Every day | ORAL | Status: DC
  Administered 2018-08-31: 75 ug via ORAL
  Filled 2018-08-30: qty 1

## 2018-08-30 MED ORDER — TRIAMTERENE-HCTZ 37.5-25 MG PO TABS: 1.0000 | ORAL_TABLET | Freq: Every day | ORAL | Status: DC | PRN

## 2018-08-30 MED ORDER — CHLORHEXIDINE GLUCONATE 4 % EX LIQD: 60.0000 mL | Freq: Once | CUTANEOUS | Status: DC

## 2018-08-30 MED ORDER — OXYCODONE HCL 5 MG PO TABS
10.0000 mg | ORAL_TABLET | ORAL | Status: DC | PRN
  Administered 2018-08-30: 10 mg via ORAL
  Filled 2018-08-30 (×2): qty 2

## 2018-08-30 MED ORDER — CEFAZOLIN SODIUM-DEXTROSE 2-4 GM/100ML-% IV SOLN
2.0000 g | Freq: Four times a day (QID) | INTRAVENOUS | Status: AC
  Administered 2018-08-30 (×2): 2 g via INTRAVENOUS
  Filled 2018-08-30 (×2): qty 100

## 2018-08-30 MED ORDER — DOCUSATE SODIUM 100 MG PO CAPS
100.0000 mg | ORAL_CAPSULE | Freq: Two times a day (BID) | ORAL | Status: DC
  Administered 2018-08-30 – 2018-08-31 (×2): 100 mg via ORAL
  Filled 2018-08-30 (×2): qty 1

## 2018-08-30 MED ORDER — FENTANYL CITRATE (PF) 250 MCG/5ML IJ SOLN
INTRAMUSCULAR | Status: AC
  Filled 2018-08-30: qty 5

## 2018-08-30 MED ORDER — METOCLOPRAMIDE HCL 5 MG/ML IJ SOLN: 5.0000 mg | Freq: Three times a day (TID) | INTRAMUSCULAR | Status: DC | PRN

## 2018-08-30 MED ORDER — CELECOXIB 200 MG PO CAPS
200.0000 mg | ORAL_CAPSULE | Freq: Two times a day (BID) | ORAL | Status: DC
  Administered 2018-08-30 – 2018-08-31 (×3): 200 mg via ORAL
  Filled 2018-08-30 (×3): qty 1

## 2018-08-30 MED ORDER — FERROUS SULFATE 325 (65 FE) MG PO TABS
325.0000 mg | ORAL_TABLET | Freq: Three times a day (TID) | ORAL | Status: DC
  Administered 2018-08-31 (×2): 325 mg via ORAL
  Filled 2018-08-30 (×2): qty 1

## 2018-08-30 MED ORDER — METHOCARBAMOL 500 MG PO TABS
500.0000 mg | ORAL_TABLET | Freq: Four times a day (QID) | ORAL | Status: DC | PRN
  Administered 2018-08-30: 500 mg via ORAL
  Filled 2018-08-30: qty 1

## 2018-08-30 MED ORDER — METHOCARBAMOL 500 MG IVPB - SIMPLE MED
500.0000 mg | Freq: Four times a day (QID) | INTRAVENOUS | Status: DC | PRN
  Filled 2018-08-30: qty 50

## 2018-08-30 MED ORDER — DIPHENHYDRAMINE HCL 12.5 MG/5ML PO ELIX: 12.5000 mg | ORAL_SOLUTION | ORAL | Status: DC | PRN

## 2018-08-30 MED ORDER — METOCLOPRAMIDE HCL 5 MG PO TABS: 5.0000 mg | ORAL_TABLET | Freq: Three times a day (TID) | ORAL | Status: DC | PRN

## 2018-08-30 MED ORDER — ROCURONIUM BROMIDE 10 MG/ML (PF) SYRINGE
PREFILLED_SYRINGE | INTRAVENOUS | Status: AC
  Filled 2018-08-30: qty 10

## 2018-08-30 MED ORDER — FENTANYL CITRATE (PF) 100 MCG/2ML IJ SOLN
INTRAMUSCULAR | Status: DC | PRN
  Administered 2018-08-30: 50 ug via INTRAVENOUS
  Administered 2018-08-30 (×2): 75 ug via INTRAVENOUS

## 2018-08-30 MED ORDER — MENTHOL 3 MG MT LOZG: 1.0000 | LOZENGE | OROMUCOSAL | Status: DC | PRN

## 2018-08-30 MED ORDER — ALPRAZOLAM 1 MG PO TABS: 1.0000 mg | ORAL_TABLET | ORAL | Status: DC

## 2018-08-30 MED ORDER — ALPRAZOLAM 1 MG PO TABS
2.0000 mg | ORAL_TABLET | Freq: Every day | ORAL | Status: DC
  Administered 2018-08-30: 2 mg via ORAL
  Filled 2018-08-30: qty 2

## 2018-08-30 MED ORDER — ALPRAZOLAM 1 MG PO TABS
1.0000 mg | ORAL_TABLET | Freq: Every day | ORAL | Status: DC
  Administered 2018-08-31: 1 mg via ORAL
  Filled 2018-08-30: qty 1

## 2018-08-30 MED ORDER — STERILE WATER FOR IRRIGATION IR SOLN
Status: DC | PRN
  Administered 2018-08-30: 2000 mL

## 2018-08-30 MED ORDER — ALUM & MAG HYDROXIDE-SIMETH 200-200-20 MG/5ML PO SUSP: 15.0000 mL | ORAL | Status: DC | PRN

## 2018-08-30 MED ORDER — SODIUM CHLORIDE 0.9 % IV BOLUS
250.0000 mL | Freq: Once | INTRAVENOUS | Status: AC
  Administered 2018-08-30: 250 mL via INTRAVENOUS

## 2018-08-30 MED ORDER — HYDROMORPHONE HCL 1 MG/ML IJ SOLN
0.2500 mg | INTRAMUSCULAR | Status: DC | PRN
  Administered 2018-08-30: 0.5 mg via INTRAVENOUS

## 2018-08-30 MED ORDER — TRANEXAMIC ACID 1000 MG/10ML IV SOLN
1000.0000 mg | INTRAVENOUS | Status: AC
  Administered 2018-08-30: 1000 mg via INTRAVENOUS
  Filled 2018-08-30: qty 10

## 2018-08-30 MED ORDER — PHENOL 1.4 % MT LIQD
1.0000 | OROMUCOSAL | Status: DC | PRN
  Filled 2018-08-30: qty 177

## 2018-08-30 MED ORDER — DEXAMETHASONE SODIUM PHOSPHATE 10 MG/ML IJ SOLN
10.0000 mg | Freq: Once | INTRAMUSCULAR | Status: AC
  Administered 2018-08-31: 10 mg via INTRAVENOUS
  Filled 2018-08-30: qty 1

## 2018-08-30 MED ORDER — ATORVASTATIN CALCIUM 10 MG PO TABS: 10.0000 mg | ORAL_TABLET | ORAL | Status: DC

## 2018-08-30 MED ORDER — LACTATED RINGERS IV SOLN: INTRAVENOUS | Status: DC

## 2018-08-30 MED ORDER — ONDANSETRON HCL 4 MG/2ML IJ SOLN: 4.0000 mg | Freq: Four times a day (QID) | INTRAMUSCULAR | Status: DC | PRN

## 2018-08-30 MED ORDER — LIDOCAINE 2% (20 MG/ML) 5 ML SYRINGE
INTRAMUSCULAR | Status: AC
  Filled 2018-08-30: qty 5

## 2018-08-30 MED ORDER — ACETAMINOPHEN 500 MG PO TABS
1000.0000 mg | ORAL_TABLET | Freq: Four times a day (QID) | ORAL | Status: AC
  Administered 2018-08-30 – 2018-08-31 (×4): 1000 mg via ORAL
  Filled 2018-08-30 (×4): qty 2

## 2018-08-30 MED ORDER — LACTATED RINGERS IV SOLN
Freq: Once | INTRAVENOUS | Status: AC
  Administered 2018-08-30: 09:00:00 via INTRAVENOUS

## 2018-08-30 MED ORDER — SODIUM CHLORIDE 0.9 % IV BOLUS
500.0000 mL | Freq: Once | INTRAVENOUS | Status: AC
  Administered 2018-08-30: 500 mL via INTRAVENOUS

## 2018-08-30 SURGICAL SUPPLY — 44 items
ADH SKN CLS APL DERMABOND .7 (GAUZE/BANDAGES/DRESSINGS) ×1
BAG DECANTER FOR FLEXI CONT (MISCELLANEOUS) IMPLANT
BAG SPEC THK2 15X12 ZIP CLS (MISCELLANEOUS)
BAG ZIPLOCK 12X15 (MISCELLANEOUS) IMPLANT
BLADE SAG 18X100X1.27 (BLADE) ×3 IMPLANT
COVER PERINEAL POST (MISCELLANEOUS) ×3 IMPLANT
COVER SURGICAL LIGHT HANDLE (MISCELLANEOUS) ×3 IMPLANT
CUP ACETBLR 54 OD PINNACLE (Hips) ×2 IMPLANT
DERMABOND ADVANCED (GAUZE/BANDAGES/DRESSINGS) ×2
DERMABOND ADVANCED .7 DNX12 (GAUZE/BANDAGES/DRESSINGS) ×1 IMPLANT
DRAPE STERI IOBAN 125X83 (DRAPES) ×3 IMPLANT
DRAPE U-SHAPE 47X51 STRL (DRAPES) ×6 IMPLANT
DRESSING AQUACEL AG SP 3.5X10 (GAUZE/BANDAGES/DRESSINGS) ×1 IMPLANT
DRSG AQUACEL AG SP 3.5X10 (GAUZE/BANDAGES/DRESSINGS) ×3
DURAPREP 26ML APPLICATOR (WOUND CARE) ×3 IMPLANT
ELECT REM PT RETURN 15FT ADLT (MISCELLANEOUS) ×3 IMPLANT
ELIMINATOR HOLE APEX DEPUY (Hips) ×2 IMPLANT
GLOVE BIOGEL M STRL SZ7.5 (GLOVE) IMPLANT
GLOVE BIOGEL PI IND STRL 7.5 (GLOVE) ×1 IMPLANT
GLOVE BIOGEL PI IND STRL 8.5 (GLOVE) ×1 IMPLANT
GLOVE BIOGEL PI INDICATOR 7.5 (GLOVE) ×2
GLOVE BIOGEL PI INDICATOR 8.5 (GLOVE) ×2
GLOVE ECLIPSE 8.0 STRL XLNG CF (GLOVE) ×6 IMPLANT
GLOVE ORTHO TXT STRL SZ7.5 (GLOVE) ×3 IMPLANT
GOWN STRL REUS W/TWL 2XL LVL3 (GOWN DISPOSABLE) ×3 IMPLANT
GOWN STRL REUS W/TWL LRG LVL3 (GOWN DISPOSABLE) ×3 IMPLANT
HEAD CERAMIC DELTA 36 PLUS 1.5 (Hips) ×2 IMPLANT
HOLDER FOLEY CATH W/STRAP (MISCELLANEOUS) ×3 IMPLANT
LINER NEUTRAL 54X36MM PLUS 4 (Hips) ×2 IMPLANT
PACK ANTERIOR HIP CUSTOM (KITS) ×3 IMPLANT
SCREW PINN CAN BONE 6.5MMX15MM (Screw) ×2 IMPLANT
STEM TRI LOC BPS SZ4 W GRIPTON (Hips) IMPLANT
SUT MNCRL AB 4-0 PS2 18 (SUTURE) ×3 IMPLANT
SUT STRATAFIX 0 PDS 27 VIOLET (SUTURE) ×3
SUT VIC AB 1 CT1 36 (SUTURE) ×9 IMPLANT
SUT VIC AB 2-0 CT1 27 (SUTURE) ×6
SUT VIC AB 2-0 CT1 TAPERPNT 27 (SUTURE) ×2 IMPLANT
SUTURE STRATFX 0 PDS 27 VIOLET (SUTURE) ×1 IMPLANT
TRAY FOLEY BAG SILVER LF 16FR (CATHETERS) ×2 IMPLANT
TRAY FOLEY CATH 14FRSI W/METER (CATHETERS) ×2 IMPLANT
TRAY FOLEY MTR SLVR 16FR STAT (SET/KITS/TRAYS/PACK) IMPLANT
TRI LOC BPS SZ 4 W GRIPTON (Hips) ×3 IMPLANT
WATER STERILE IRR 1000ML POUR (IV SOLUTION) ×1 IMPLANT
YANKAUER SUCT BULB TIP 10FT TU (MISCELLANEOUS) IMPLANT

## 2018-08-30 NOTE — Progress Notes (Signed)
PT Cancellation Note  Patient Details Name: April Brewer MRN: [id number] DOB: [birth year]   Cancelled Treatment:    Reason Eval/Treat Not Completed: Medical issues which prohibited therapy - Pt with low BP this afternoon (79/47, 84/56, 94/55). Per RN note, MD contacted to determine need for bolus. Will follow up tomorrow morning.   Julien Girt, PT, DPT  Pager # [phone]     April Brewer 08/30/2018, 6:38 PM

## 2018-08-30 NOTE — Transfer of Care (Signed)
Immediate Anesthesia Transfer of Care Note  Patient: April Brewer  Procedure(s) Performed: RIGHT TOTAL HIP ARTHROPLASTY ANTERIOR APPROACH (Right Hip)  Patient Location: PACU  Anesthesia Type:General  Level of Consciousness: awake, alert  and oriented  Airway & Oxygen Therapy: Patient Spontanous Breathing and Patient connected to face mask oxygen  Post-op Assessment: Report given to RN and Post -op Vital signs reviewed and stable  Post vital signs: Reviewed and stable  Last Vitals:  Vitals Value Taken Time  BP 120/72 08/30/2018 12:30 PM  Temp    Pulse 61 08/30/2018 12:31 PM  Resp 15 08/30/2018 12:31 PM  SpO2 100 % 08/30/2018 12:31 PM  Vitals shown include unvalidated device data.  Last Pain:  Vitals:   08/30/18 0816  TempSrc: Oral  PainSc: 8          Complications: No apparent anesthesia complications

## 2018-08-30 NOTE — Progress Notes (Signed)
Pts BP only came up to 85/58 after 235ml NS bolus give at 18:30. Pt denies any c/o dizziness or lightheadedness while lying in bed. Notified on call provider for [facility]. Received a return call from [facility]. New orders to give 569ml NS bolus x1. New orders implemented and will continue to monitor BP.

## 2018-08-30 NOTE — Discharge Instructions (Signed)

## 2018-08-30 NOTE — Anesthesia Preprocedure Evaluation (Addendum)
Anesthesia Evaluation  Patient identified by MRN, date of birth, ID band Patient awake    Reviewed: Allergy & Precautions, NPO status , Patient's Chart, lab work & pertinent test results  Airway Mallampati: III  TM Distance: >3 FB Neck ROM: Full    Dental  (+) Teeth Intact, Dental Advisory Given, Chipped,    Pulmonary neg pulmonary ROS,    breath sounds clear to auscultation       Cardiovascular hypertension,  Rhythm:Regular Rate:Normal     Neuro/Psych PSYCHIATRIC DISORDERS Anxiety Depression  Neuromuscular disease    GI/Hepatic Neg liver ROS, hiatal hernia, GERD  ,  Endo/Other  Hypothyroidism   Renal/GU negative Renal ROS     Musculoskeletal  (+) Arthritis ,   Abdominal Normal abdominal exam  (+)   Peds  Hematology   Anesthesia Other Findings   Reproductive/Obstetrics                           Lab Results  Component Value Date   WBC 7.7 08/24/2018   HGB 10.6 (L) 08/24/2018   HCT 30.9 (L) 08/24/2018   MCV 93.6 08/24/2018   PLT 234 08/24/2018   Lab Results  Component Value Date   CREATININE 0.84 08/24/2018   BUN 18 08/24/2018   NA 134 (L) 08/24/2018   K 4.3 08/24/2018   CL 99 08/24/2018   CO2 27 08/24/2018   Lab Results  Component Value Date   INR 1.1 (H) 03/22/2014   INR 0.94 05/26/2012   INR 0.92 11/29/2009     Anesthesia Physical Anesthesia Plan  ASA: II  Anesthesia Plan: General   Post-op Pain Management:    Induction: Intravenous  PONV Risk Score and Plan: 3 and Ondansetron, Propofol infusion, Midazolam and Treatment may vary due to age or medical condition  Airway Management Planned: Oral ETT  Additional Equipment: None  Intra-op Plan:   Post-operative Plan: Extubation in OR  Informed Consent: I have reviewed the patients History and Physical, chart, labs and discussed the procedure including the risks, benefits and alternatives for the proposed  anesthesia with the patient or authorized representative who has indicated his/her understanding and acceptance.   Dental advisory given  Plan Discussed with: CRNA  Anesthesia Plan Comments:       Anesthesia Quick Evaluation

## 2018-08-30 NOTE — Anesthesia Postprocedure Evaluation (Signed)
Anesthesia Post Note  Patient: April Brewer  Procedure(s) Performed: RIGHT TOTAL HIP ARTHROPLASTY ANTERIOR APPROACH (Right Hip)     Patient location during evaluation: PACU Anesthesia Type: General Level of consciousness: awake and alert Pain management: pain level controlled Vital Signs Assessment: post-procedure vital signs reviewed and stable Respiratory status: spontaneous breathing, nonlabored ventilation, respiratory function stable and patient connected to nasal cannula oxygen Cardiovascular status: blood pressure returned to baseline and stable Postop Assessment: no apparent nausea or vomiting Anesthetic complications: no    Last Vitals:  Vitals:   08/30/18 1400 08/30/18 1431  BP: 111/62 118/76  Pulse: 63 70  Resp: 14 14  Temp: 36.7 C 36.5 C  SpO2: 100% 100%    Last Pain:  Vitals:   08/30/18 1431  TempSrc: Oral  PainSc:                  Effie Berkshire

## 2018-08-30 NOTE — Anesthesia Procedure Notes (Signed)
Procedure Name: Intubation Date/Time: 08/30/2018 10:31 AM Performed by: Glory Buff, CRNA Pre-anesthesia Checklist: Patient identified, Emergency Drugs available, Suction available and Patient being monitored Patient Re-evaluated:Patient Re-evaluated prior to induction Oxygen Delivery Method: Circle system utilized Preoxygenation: Pre-oxygenation with 100% oxygen Induction Type: IV induction Ventilation: Mask ventilation without difficulty Laryngoscope Size: Miller and 3 Grade View: Grade I Tube type: Oral Tube size: 7.0 mm Number of attempts: 1 Airway Equipment and Method: Stylet and Oral airway Placement Confirmation: ETT inserted through vocal cords under direct vision,  positive ETCO2 and breath sounds checked- equal and bilateral Secured at: 20 cm Tube secured with: Tape Dental Injury: Teeth and Oropharynx as per pre-operative assessment

## 2018-08-30 NOTE — Op Note (Signed)
NAME:  April Brewer                ACCOUNT NO.: [id number]      MEDICAL RECORD NO.: [id number]      FACILITY:  [facility]      PHYSICIAN:  Mauri Pole  DATE OF BIRTH:  November 21, 1946     DATE OF PROCEDURE:  08/30/2018                                 OPERATIVE REPORT         PREOPERATIVE DIAGNOSIS: Right  hip osteoarthritis.      POSTOPERATIVE DIAGNOSIS:  Right hip osteoarthritis.      PROCEDURE:  Right total hip replacement through an anterior approach   utilizing DePuy THR system, component size 56mm pinnacle cup, a size 36+4 neutral   Altrex liner, a size 4 Hi Tri Lock stem with a 36+1.5 delta ceramic   ball.      SURGEON:  Pietro Cassis. Alvan Dame, M.D.      ASSISTANT:  Danae Orleans, PA-C     ANESTHESIA:  General.      SPECIMENS:  None.      COMPLICATIONS:  None.      BLOOD LOSS:  325 cc     DRAINS:  None.      INDICATION OF THE PROCEDURE:  April Brewer is a 72 y.o. female who had   presented to office for evaluation of right hip pain.  Radiographs revealed   progressive degenerative changes with bone-on-bone   articulation of the  hip joint, including subchondral cystic changes and osteophytes.  The patient had painful limited range of   motion significantly affecting their overall quality of life and function.  The patient was failing to    respond to conservative measures including medications and/or injections and activity modification and at this point was ready   to proceed with more definitive measures.  Consent was obtained for   benefit of pain relief.  Specific risks of infection, DVT, component   failure, dislocation, neurovascular injury, and need for revision surgery were reviewed in the office as well discussion of   the anterior versus posterior approach were reviewed.     PROCEDURE IN DETAIL:  The patient was brought to operative theater.   Once adequate anesthesia, preoperative antibiotics, 2 gm of Ancef, 1 gm of Tranexamic Acid, and  10 mg of Decadron were administered, the patient was positioned supine on the [employer] table.  Once the patient was safely positioned with adequate padding of boney prominences we predraped out the hip, and used fluoroscopy to confirm orientation of the pelvis.      The right hip was then prepped and draped from proximal iliac crest to   mid thigh with a shower curtain technique.      Time-out was performed identifying the patient, planned procedure, and the appropriate extremity.     An incision was then made 2 cm lateral to the   anterior superior iliac spine extending over the orientation of the   tensor fascia lata muscle and sharp dissection was carried down to the   fascia of the muscle.      The fascia was then incised.  The muscle belly was identified and swept   laterally and retractor placed along the superior neck.  Following   cauterization of the circumflex vessels and removing some  pericapsular   fat, a second cobra retractor was placed on the inferior neck.  A T-capsulotomy was made along the line of the   superior neck to the trochanteric fossa, then extended proximally and   distally.  Tag sutures were placed and the retractors were then placed   intracapsular.  We then identified the trochanteric fossa and   orientation of my neck cut and then made a neck osteotomy with the femur on traction.  The femoral   head was removed without difficulty or complication.  Traction was let   off and retractors were placed posterior and anterior around the   acetabulum.      The labrum and foveal tissue were debrided.  I began reaming with a 46 mm   reamer and reamed up to 53 mm reamer with good bony bed preparation and a 54 mm  cup was chosen.  The final 54 mm Pinnacle cup was then impacted under fluoroscopy to confirm the depth of penetration and orientation with respect to   Abduction and forward flexion.  A screw was placed into the ilium followed by the hole eliminator.  The  final   36+4 neutral Altrex liner was impacted with good visualized rim fit.  The cup was positioned anatomically within the acetabular portion of the pelvis.      At this point, the femur was rolled to 100 degrees.  Further capsule was   released off the inferior aspect of the femoral neck.  I then   released the superior capsule proximally.  With the leg in a neutral position the hook was placed laterally   along the femur under the vastus lateralis origin and elevated manually and then held in position using the hook attachment on the bed.  The leg was then extended and adducted with the leg rolled to 100   degrees of external rotation.  Retractors were placed along the medial calcar and posteriorly over the greater trochanter.  Once the proximal femur was fully   exposed, I used a box osteotome to set orientation.  I then began   broaching with the starting chili pepper broach and passed this by hand and then broached up to 4.  With the 4 broach in place I chose a high offset neck and did several trial reductions.  The offset was appropriate, leg lengths   appeared to be equal best matched with the +1.5 head ball trial confirmed radiographically.   Given these findings, I went ahead and dislocated the hip, repositioned all   retractors and positioned the right hip in the extended and abducted position.  The final 4 Hi Tri Lock stem was   chosen and it was impacted down to the level of neck cut.  Based on this   and the trial reductions, a final 36+1.5 delta ceramic ball was chosen and   impacted onto a clean and dry trunnion, and the hip was reduced.  The   hip had been irrigated throughout the case again at this point.  I did   reapproximate the superior capsular leaflet to the anterior leaflet   using #1 Vicryl.  The fascia of the   tensor fascia lata muscle was then reapproximated using #1 Vicryl and #0 Stratafix sutures.  The   remaining wound was closed with 2-0 Vicryl and running 4-0  Monocryl.   The hip was cleaned, dried, and dressed sterilely using Dermabond and   Aquacel dressing.  The patient was then brought  to recovery room in stable condition tolerating the procedure well.    Danae Orleans, PA-C was present for the entirety of the case involved from   preoperative positioning, perioperative retractor management, general   facilitation of the case, as well as primary wound closure as assistant.            Pietro Cassis Alvan Dame, M.D.        08/30/2018 11:53 AM

## 2018-08-30 NOTE — Progress Notes (Signed)
Pt's BP low. Contacted [employer] PA who ordered 250 cc bolus. Pt asymptomatic and eating dinner.

## 2018-08-30 NOTE — Interval H&P Note (Signed)
History and Physical Interval Note:  08/30/2018 9:04 AM  April Brewer  has presented today for surgery, with the diagnosis of Right hip osteoarthritis  The various methods of treatment have been discussed with the patient and family. After consideration of risks, benefits and other options for treatment, the patient has consented to  Procedure(s) with comments: RIGHT TOTAL HIP ARTHROPLASTY ANTERIOR APPROACH (Right) - 70 mins as a surgical intervention .  The patient's history has been reviewed, patient examined, no change in status, stable for surgery.  I have reviewed the patient's chart and labs.  Questions were answered to the patient's satisfaction.     Mauri Pole

## 2018-08-31 ENCOUNTER — Encounter (HOSPITAL_COMMUNITY): Payer: Self-pay | Admitting: Orthopedic Surgery

## 2018-08-31 LAB — BASIC METABOLIC PANEL
Anion gap: 6 (ref 5–15)
BUN: 18 mg/dL (ref 8–23)
CHLORIDE: 101 mmol/L (ref 98–111)
CO2: 27 mmol/L (ref 22–32)
Calcium: 8.3 mg/dL — ABNORMAL LOW (ref 8.9–10.3)
Creatinine, Ser: 0.99 mg/dL (ref 0.44–1.00)
GFR calc non Af Amer: 55 mL/min — ABNORMAL LOW (ref >60)
Glucose, Bld: 143 mg/dL — ABNORMAL HIGH (ref 70–99)
POTASSIUM: 3.9 mmol/L (ref 3.5–5.1)
SODIUM: 134 mmol/L — AB (ref 135–145)

## 2018-08-31 LAB — CBC
HEMATOCRIT: 24.8 % — AB (ref 36.0–46.0)
HEMOGLOBIN: 8.3 g/dL — AB (ref 12.0–15.0)
MCH: 31.7 pg (ref 26.0–34.0)
MCHC: 33.5 g/dL (ref 30.0–36.0)
MCV: 94.7 fL (ref 78.0–100.0)
Platelets: 166 10*3/uL (ref 150–400)
RBC: 2.62 MIL/uL — AB (ref 3.87–5.11)
RDW: 14.5 % (ref 11.5–15.5)
WBC: 8.7 10*3/uL (ref 4.0–10.5)

## 2018-08-31 MED ORDER — ACETAMINOPHEN 500 MG PO TABS: 1000.0000 mg | ORAL_TABLET | Freq: Three times a day (TID) | ORAL | 0 refills | Status: DC

## 2018-08-31 MED ORDER — FERROUS SULFATE 325 (65 FE) MG PO TABS: 325.0000 mg | ORAL_TABLET | Freq: Three times a day (TID) | ORAL | 3 refills | Status: DC

## 2018-08-31 MED ORDER — INFLUENZA VAC SPLIT HIGH-DOSE 0.5 ML IM SUSY: 0.5000 mL | PREFILLED_SYRINGE | INTRAMUSCULAR | Status: DC

## 2018-08-31 MED ORDER — OXYCODONE HCL 5 MG PO TABS: 5.0000 mg | ORAL_TABLET | ORAL | 0 refills | Status: DC | PRN

## 2018-08-31 MED ORDER — DOCUSATE SODIUM 100 MG PO CAPS: 100.0000 mg | ORAL_CAPSULE | Freq: Two times a day (BID) | ORAL | 0 refills | Status: DC

## 2018-08-31 MED ORDER — POLYETHYLENE GLYCOL 3350 17 G PO PACK: 17.0000 g | PACK | Freq: Two times a day (BID) | ORAL | 0 refills | Status: AC

## 2018-08-31 MED ORDER — METHOCARBAMOL 500 MG PO TABS: 500.0000 mg | ORAL_TABLET | Freq: Four times a day (QID) | ORAL | 0 refills | Status: DC | PRN

## 2018-08-31 MED ORDER — ASPIRIN 81 MG PO CHEW: 81.0000 mg | CHEWABLE_TABLET | Freq: Two times a day (BID) | ORAL | 0 refills | Status: AC

## 2018-08-31 NOTE — Evaluation (Signed)
Physical Therapy Evaluation Patient Details Name: April Brewer MRN: [id number] DOB: 20-Mar-1946 Today's Date: 08/31/2018   History of Present Illness  Pt s/p R THR and with hx of L THR and back surgery x 5  Clinical Impression  Pt s/p R THR and presents with decreased R LE strength/ROM and post op pain limiting functional mobility.  Pt should progress to dc home with family assist.    Follow Up Recommendations Follow surgeon's recommendation for DC plan and follow-up therapies    Equipment Recommendations  None recommended by PT    Recommendations for Other Services       Precautions / Restrictions Precautions Precautions: Fall Restrictions Weight Bearing Restrictions: No      Mobility  Bed Mobility Overal bed mobility: Needs Assistance Bed Mobility: Supine to Sit     Supine to sit: Min assist     General bed mobility comments: cues for sequence and use of L LE to self assist  Transfers Overall transfer level: Needs assistance Equipment used: Rolling walker (2 wheeled) Transfers: Sit to/from Stand Sit to Stand: Min assist         General transfer comment: cues for LE management and use of UEs to self assist  Ambulation/Gait Ambulation/Gait assistance: Min assist;Min guard Gait Distance (Feet): 200 Feet Assistive device: Rolling walker (2 wheeled) Gait Pattern/deviations: Step-to pattern;Step-through pattern;Decreased step length - right;Decreased step length - left;Shuffle;Trunk flexed Gait velocity: decr   General Gait Details:  cues for sequence, posture, position from RW and ER on R  Stairs            Wheelchair Mobility    Modified Rankin (Stroke Patients Only)       Balance Overall balance assessment: Mild deficits observed, not formally tested                                           Pertinent Vitals/Pain Pain Assessment: 0-10 Pain Score: 3  Pain Location: L hip Pain Descriptors / Indicators: Aching;Sore Pain  Intervention(s): Limited activity within patient's tolerance;Monitored during session;Premedicated before session;Ice applied    Home Living Family/patient expects to be discharged to:: Private residence Living Arrangements: Children;Other relatives Available Help at Discharge: Family;Available 24 hours/day Type of Home: House Home Access: Level entry     Home Layout: Two level Home Equipment: Walker - 2 wheels;Cane - single point;Bedside commode Additional Comments: niece and son will assist    Prior Function Level of Independence: Independent;Independent with assistive device(s)               Hand Dominance        Extremity/Trunk Assessment   Upper Extremity Assessment Upper Extremity Assessment: Overall WFL for tasks assessed    Lower Extremity Assessment Lower Extremity Assessment: RLE deficits/detail RLE Deficits / Details: 2+/5 strength wtih AAROM at hip to 90 flex and 20 abd       Communication   Communication: No difficulties  Cognition Arousal/Alertness: Awake/alert Behavior During Therapy: WFL for tasks assessed/performed Overall Cognitive Status: Within Functional Limits for tasks assessed                                        General Comments      Exercises Total Joint Exercises Ankle Circles/Pumps: AROM;Both;20 reps;Supine Quad Sets: AROM;Both;10 reps;Supine Heel Slides:  AAROM;Right;20 reps;Supine Hip ABduction/ADduction: AAROM;Right;15 reps;Supine   Assessment/Plan    PT Assessment Patient needs continued PT services  PT Problem List Decreased strength;Decreased range of motion;Decreased activity tolerance;Decreased mobility;Decreased knowledge of use of DME;Pain       PT Treatment Interventions DME instruction;Gait training;Stair training;Functional mobility training;Therapeutic activities;Therapeutic exercise;Patient/family education    PT Goals (Current goals can be found in the Care Plan section)  Acute Rehab PT  Goals Patient Stated Goal: Regain IND PT Goal Formulation: With patient Time For Goal Achievement: 09/07/18 Potential to Achieve Goals: Good    Frequency 7X/week   Barriers to discharge        Co-evaluation               AM-PAC PT "6 Clicks" Daily Activity  Outcome Measure Difficulty turning over in bed (including adjusting bedclothes, sheets and blankets)?: A Lot Difficulty moving from lying on back to sitting on the side of the bed? : A Lot Difficulty sitting down on and standing up from a chair with arms (e.g., wheelchair, bedside commode, etc,.)?: A Lot Help needed moving to and from a bed to chair (including a wheelchair)?: A Little Help needed walking in hospital room?: A Little Help needed climbing 3-5 steps with a railing? : A Little 6 Click Score: 15    End of Session Equipment Utilized During Treatment: Gait belt Activity Tolerance: Patient tolerated treatment well Patient left: in chair;with call bell/phone within reach Nurse Communication: Mobility status PT Visit Diagnosis: Difficulty in walking, not elsewhere classified (R26.2)    Time: 8592-9244 PT Time Calculation (min) (ACUTE ONLY): 50 min   Charges:   PT Evaluation $PT Eval Low Complexity: 1 Low PT Treatments $Gait Training: 8-22 mins $Therapeutic Exercise: 8-22 mins        Pg [phone]   Chaitanya Amedee 08/31/2018, 10:54 AM

## 2018-08-31 NOTE — Progress Notes (Signed)
     Subjective: 1 Day Post-Op Procedure(s) (LRB): RIGHT TOTAL HIP ARTHROPLASTY ANTERIOR APPROACH (Right)   Patient reports pain as mild, pain well controlled.  Patient had low BP during the night, for which she received a couple of boluses of fluid.  Discussion this morning the patient states that it runs a little low normally.  We had a discussion and she states that she is able to take her BP at home and will do this. We have discussed that if the systolic number is lower than 120 she should hold her BP meds, same discussion was had with her nurse in the hospital.  Patent hasn't work with PT yet, but is hoping to go home today.  I will get things ready and if she does well with PT then she can be discharged home.     Objective:   VITALS:   Vitals:   08/31/18 0639 08/31/18 0641  BP: (!) 88/62 (!) 94/58  Pulse: 66 64  Resp:    Temp:    SpO2:      Dorsiflexion/Plantar flexion intact Incision: dressing C/D/I No cellulitis present Compartment soft  LABS Recent Labs    08/31/18 0526  HGB 8.3*  HCT 24.8*  WBC 8.7  PLT 166    Recent Labs    08/31/18 0526  NA 134*  K 3.9  BUN 18  CREATININE 0.99  GLUCOSE 143*     Assessment/Plan: 1 Day Post-Op Procedure(s) (LRB): RIGHT TOTAL HIP ARTHROPLASTY ANTERIOR APPROACH (Right) Hold BP meds until systolic over 465 Foley cath d/c'ed Advance diet Up with therapy D/C IV fluids Discharge home Follow up in 2 weeks at [facility] Follow up with OLIN,Baruc Tugwell D in 2 weeks.  Contact information:  [facility]) [address] [place] [employer]. April Brewer   PAC  08/31/2018, 8:42 AM

## 2018-08-31 NOTE — Progress Notes (Signed)
Physical Therapy Treatment Patient Details Name: April Brewer MRN: [id number] DOB: Jun 13, 1946 Today's Date: 08/31/2018    History of Present Illness Pt s/p R THR and with hx of L THR and back surgery x 5    PT Comments    Pt progressing well with mobility and with no c/o dizziness.  Pt reviewed car transfers, stairs and home therex program with written instruction provided.    Follow Up Recommendations  Follow surgeon's recommendation for DC plan and follow-up therapies     Equipment Recommendations  None recommended by PT    Recommendations for Other Services       Precautions / Restrictions Precautions Precautions: Fall Restrictions Weight Bearing Restrictions: No    Mobility  Bed Mobility Overal bed mobility: Needs Assistance Bed Mobility: Supine to Sit     Supine to sit: Min assist     General bed mobility comments: Pt up in chair and requests back to same  Transfers Overall transfer level: Needs assistance Equipment used: Rolling walker (2 wheeled) Transfers: Sit to/from Stand Sit to Stand: Supervision         General transfer comment: cues for LE management and use of UEs to self assist  Ambulation/Gait Ambulation/Gait assistance: Min guard;Supervision Gait Distance (Feet): 150 Feet(and 15' into bathroom) Assistive device: Rolling walker (2 wheeled) Gait Pattern/deviations: Step-to pattern;Step-through pattern;Decreased step length - right;Decreased step length - left;Shuffle;Trunk flexed Gait velocity: decr   General Gait Details:  cues for sequence, posture, position from RW and ER on R   Stairs Stairs: Yes Stairs assistance: Min assist Stair Management: One rail Left;Step to pattern;Forwards;With cane Number of Stairs: 4 General stair comments: cues for sequence and foot/cane placement   Wheelchair Mobility    Modified Rankin (Stroke Patients Only)       Balance Overall balance assessment: Mild deficits observed, not formally  tested                                          Cognition Arousal/Alertness: Awake/alert Behavior During Therapy: WFL for tasks assessed/performed Overall Cognitive Status: Within Functional Limits for tasks assessed                                        Exercises Total Joint Exercises Ankle Circles/Pumps: AROM;Both;20 reps;Supine Quad Sets: AROM;Both;10 reps;Supine Heel Slides: AAROM;Right;20 reps;Supine Hip ABduction/ADduction: AAROM;Right;15 reps;Supine Long Arc Quad: AROM;Right;10 reps;Seated    General Comments        Pertinent Vitals/Pain Pain Assessment: 0-10 Pain Score: 3  Pain Location: L hip Pain Descriptors / Indicators: Aching;Sore Pain Intervention(s): Limited activity within patient's tolerance;Monitored during session;Premedicated before session;Ice applied    Home Living Family/patient expects to be discharged to:: Private residence Living Arrangements: Children;Other relatives Available Help at Discharge: Family;Available 24 hours/day Type of Home: House Home Access: Level entry   Home Layout: Two level Home Equipment: Walker - 2 wheels;Cane - single point;Bedside commode Additional Comments: niece and son will assist    Prior Function Level of Independence: Independent;Independent with assistive device(s)          PT Goals (current goals can now be found in the care plan section) Acute Rehab PT Goals Patient Stated Goal: Regain IND PT Goal Formulation: With patient Time For Goal Achievement: 09/07/18 Potential to Achieve Goals: Good Progress  towards PT goals: Progressing toward goals    Frequency    7X/week      PT Plan Current plan remains appropriate    Co-evaluation              AM-PAC PT "6 Clicks" Daily Activity  Outcome Measure  Difficulty turning over in bed (including adjusting bedclothes, sheets and blankets)?: A Lot Difficulty moving from lying on back to sitting on the side of the  bed? : A Lot Difficulty sitting down on and standing up from a chair with arms (e.g., wheelchair, bedside commode, etc,.)?: A Lot Help needed moving to and from a bed to chair (including a wheelchair)?: A Little Help needed walking in hospital room?: A Little Help needed climbing 3-5 steps with a railing? : A Little 6 Click Score: 15    End of Session Equipment Utilized During Treatment: Gait belt Activity Tolerance: Patient tolerated treatment well Patient left: in chair;with call bell/phone within reach Nurse Communication: Mobility status PT Visit Diagnosis: Difficulty in walking, not elsewhere classified (R26.2)     Time: 0211-1735 PT Time Calculation (min) (ACUTE ONLY): 41 min  Charges:  $Gait Training: 8-22 mins $Therapeutic Exercise: 8-22 mins $Therapeutic Activity: 8-22 mins                     Pg [phone]    Jakaden Ouzts 08/31/2018, 12:55 PM

## 2018-08-31 NOTE — Discharge Summary (Signed)
Physician Discharge Summary  Patient ID: April Brewer MRN: [id number] DOB/AGE: [birth year] 72 y.o.  Admit date: 08/30/2018 Discharge date: 08/31/2018   Procedures:  Procedure(s) (LRB): RIGHT TOTAL HIP ARTHROPLASTY ANTERIOR APPROACH (Right)  Attending Physician:  Dr. Paralee Cancel   Admission Diagnoses:   Right hip primary OA /pain  Discharge Diagnoses:  Principal Problem:   S/P right THA, AA Active Problems:   S/P left THA, AA  Past Medical History:  Diagnosis Date  . ALLERGIC RHINITIS 06/02/2007  . Allergy   . Arthritis    DDD of cervical, thoracic, and lumbar spine  . Blood transfusion without reported diagnosis   . Cataract    Bilateral removed cateracts  . Cervical spondylosis    C5-6 and C6-7.  Pain mgmt as per Dr. Nelva Bush.  . Chronic low back pain 02/18/2009   s/p fusion L3-4 through L5-S1.  Pain meds per Dr. Nelva Bush  . DEPRESSION 06/02/2007  . Diverticulosis   . GERD (gastroesophageal reflux disease)   . Heart murmur   . Hiatal hernia   . HYPOTHYROIDISM 06/02/2007   GOITER  . INSOMNIA 06/02/2007  . Lymphocytic colitis   . Muscle spasms of lower extremity   . Nasal septal perforation    chronic; hx of epistaxis  . Normocytic anemia 04/2015   HEME + in ED 08/07/15--endoscopic eval unrevealing.  Iron studies fine.  ?Anemia of chronic dz (lymphocytic colitis?)  . OSTEOPENIA 02/18/2009   Repeat DEXA 07/2014 showed osteoporosis by T score in radius but spine and hip T scores were normal-continue vit D and calcium and repeat DEXA 08/2016 unchanged.  Repeat DEXA 2 yrs.  . Peripheral edema    LE's, nonpitting    HPI:    April Brewer, 72 y.o. female, has a history of pain and functional disability in the right hip(s) due to arthritis and patient has failed non-surgical conservative treatments for greater than 12 weeks to include NSAID's and/or analgesics, corticosteriod injections and activity modification.  Onset of symptoms was gradual starting 2+ years ago with gradually  worsening course since that time.The patient noted prior procedures of the hip to include arthroplasty on the left hip in 2014 per Dr. Alvan Dame.  Patient currently rates pain in the right hip at 9 out of 10 with activity. Patient has night pain, worsening of pain with activity and weight bearing, trendelenberg gait, pain that interfers with activities of daily living and pain with passive range of motion. Patient has evidence of periarticular osteophytes and joint space narrowing by imaging studies. This condition presents safety issues increasing the risk of falls. There is no current active infection.  Risks, benefits and expectations were discussed with the patient.  Risks including but not limited to the risk of anesthesia, blood clots, nerve damage, blood vessel damage, failure of the prosthesis, infection and up to and including death.  Patient understand the risks, benefits and expectations and wishes to proceed with surgery.   PCP: Egbert Garibaldi, PA-C   Discharged Condition: good  Hospital Course:  Patient underwent the above stated procedure on 08/30/2018. Patient tolerated the procedure well and brought to the recovery room in good condition and subsequently to the floor.  POD #1 BP: 94/58 ; Pulse: 64 ; Temp: 97.7 F (36.5 C) ; Resp: 15 Patient reports pain as mild, pain well controlled.  Patient had low BP during the night, for which she received a couple of boluses of fluid.  Discussion this morning the patient states that it runs a  little low normally.  We had a discussion and she states that she is able to take her BP at home and will do this. We have discussed that if the systolic number is lower than 120 she should hold her BP meds, same discussion was had with her nurse in the hospital.  Patent hasn't work with PT yet, but is hoping to go home today.  I will get things ready and if she does well with PT then she can be discharged home.   Dorsiflexion/plantar flexion intact, incision: dressing  C/D/I, no cellulitis present and compartment soft.   LABS  Basename    HGB     8.3  HCT     24.8    Discharge Exam: General appearance: alert, cooperative and no distress Extremities: Homans sign is negative, no sign of DVT, no edema, redness or tenderness in the calves or thighs and no ulcers, gangrene or trophic changes  Disposition: Home with follow up in 2 weeks   Follow-up Information    Paralee Cancel, MD. Schedule an appointment as soon as possible for a visit in 2 weeks.   Specialty:  Orthopedic Surgery Contact information: [address] [place] [address] [phone]           Discharge Instructions    Call MD / Call 911   Complete by:  As directed    If you experience chest pain or shortness of breath, CALL 911 and be transported to the hospital emergency room.  If you develope a fever above 101 F, pus (white drainage) or increased drainage or redness at the wound, or calf pain, call your surgeon's office.   Call MD / Call 911   Complete by:  As directed    If you experience chest pain or shortness of breath, CALL 911 and be transported to the hospital emergency room.  If you develope a fever above 101 F, pus (white drainage) or increased drainage or redness at the wound, or calf pain, call your surgeon's office.   Change dressing   Complete by:  As directed    Maintain surgical dressing until follow up in the clinic. If the edges start to pull up, may reinforce with tape. If the dressing is no longer working, may remove and cover with gauze and tape, but must keep the area dry and clean.  Call with any questions or concerns.   Change dressing   Complete by:  As directed    Maintain surgical dressing until follow up in the clinic. If the edges start to pull up, may reinforce with tape. If the dressing is no longer working, may remove and cover with gauze and tape, but must keep the area dry and clean.  Call with any questions or concerns.    Constipation Prevention   Complete by:  As directed    Drink plenty of fluids.  Prune juice may be helpful.  You may use a stool softener, such as Colace (over the counter) 100 mg twice a day.  Use MiraLax (over the counter) for constipation as needed.   Constipation Prevention   Complete by:  As directed    Drink plenty of fluids.  Prune juice may be helpful.  You may use a stool softener, such as Colace (over the counter) 100 mg twice a day.  Use MiraLax (over the counter) for constipation as needed.   Diet - low sodium heart healthy   Complete by:  As directed    Diet -  low sodium heart healthy   Complete by:  As directed    Discharge instructions   Complete by:  As directed    Maintain surgical dressing until follow up in the clinic. If the edges start to pull up, may reinforce with tape. If the dressing is no longer working, may remove and cover with gauze and tape, but must keep the area dry and clean.  Follow up in 2 weeks at [facility]. Call with any questions or concerns.   Discharge instructions   Complete by:  As directed    Maintain surgical dressing until follow up in the clinic. If the edges start to pull up, may reinforce with tape. If the dressing is no longer working, may remove and cover with gauze and tape, but must keep the area dry and clean.  Follow up in 2 weeks at [facility]. Call with any questions or concerns.   Increase activity slowly as tolerated   Complete by:  As directed    Weight bearing as tolerated with assist device (walker, cane, etc) as directed, use it as long as suggested by your surgeon or therapist, typically at least 4-6 weeks.   Increase activity slowly as tolerated   Complete by:  As directed    Weight bearing as tolerated with assist device (walker, cane, etc) as directed, use it as long as suggested by your surgeon or therapist, typically at least 4-6 weeks.   TED hose   Complete by:  As directed    Use stockings (TED  hose) for 2 weeks on both leg(s).  You may remove them at night for sleeping.   TED hose   Complete by:  As directed    Use stockings (TED hose) for 2 weeks on both leg(s).  You may remove them at night for sleeping.      Allergies as of 08/31/2018      Reactions   Latex Rash      Medication List    STOP taking these medications   ibuprofen 600 MG tablet Commonly known as:  ADVIL,MOTRIN     TAKE these medications   acetaminophen 500 MG tablet Commonly known as:  TYLENOL Take 2 tablets (1,000 mg total) by mouth every 8 (eight) hours.   alprazolam 2 MG tablet Commonly known as:  XANAX Take one-half to one tablet by mouth twice daily as needed What changed:    how much to take  how to take this  when to take this  additional instructions   aspirin 81 MG chewable tablet Chew 1 tablet (81 mg total) by mouth 2 (two) times daily. Take for 4 weeks, then resume regular dose. Start taking on:  09/01/2018   atorvastatin 10 MG tablet Commonly known as:  LIPITOR Take 10 mg by mouth 2 (two) times a week.   dicyclomine 10 MG capsule Commonly known as:  BENTYL TAKE ONE CAPSULE BY MOUTH  THREE OR FOUR TIMES DAILY  AS NEEDED FOR CRAMPING/PAIN   docusate sodium 100 MG capsule Commonly known as:  COLACE Take 1 capsule (100 mg total) by mouth 2 (two) times daily. What changed:    when to take this  reasons to take this   ferrous sulfate 325 (65 FE) MG tablet Take 1 tablet (325 mg total) by mouth 3 (three) times daily with meals.   FUSION PLUS Caps Take 1 capsule by mouth daily.   levothyroxine 75 MCG tablet Commonly known as:  SYNTHROID, LEVOTHROID TAKE 1 TABLET BY MOUTH  ONCE DAILY WITH BREAKFAST What changed:  See the new instructions.   methocarbamol 500 MG tablet Commonly known as:  ROBAXIN Take 1 tablet (500 mg total) by mouth every 6 (six) hours as needed for muscle spasms. What changed:    medication strength  how much to take  when to take this  reasons  to take this   montelukast 10 MG tablet Commonly known as:  SINGULAIR Take 1 tablet (10 mg total) by mouth at bedtime. Patient needs follow up visit for further refills   omeprazole 40 MG capsule Commonly known as:  PRILOSEC TAKE 1 CAPSULE BY MOUTH  DAILY What changed:    when to take this  reasons to take this   oxyCODONE 5 MG immediate release tablet Commonly known as:  Oxy IR/ROXICODONE Take 1-2 tablets (5-10 mg total) by mouth every 4 (four) hours as needed for moderate pain or severe pain. What changed:    medication strength  how much to take  when to take this  reasons to take this   polyethylene glycol packet Commonly known as:  MIRALAX / GLYCOLAX Take 17 g by mouth 2 (two) times daily.   sertraline 100 MG tablet Commonly known as:  ZOLOFT TAKE ONE AND ONE-HALF  TABLET BY MOUTH NIGHTLY AT  BEDTIME What changed:  See the new instructions.   traZODone 50 MG tablet Commonly known as:  DESYREL TAKE 4 TABLETS BY MOUTH  EVERY NIGHT AT BEDTIME AS  NEEDED FOR INSOMNIA What changed:  See the new instructions.   triamterene-hydrochlorothiazide 37.5-25 MG tablet Commonly known as:  MAXZIDE-25 Take 1 tablet by mouth daily as needed (swelling).            Discharge Care Instructions  (From admission, onward)         Start     Ordered   08/31/18 0000  Change dressing    Comments:  Maintain surgical dressing until follow up in the clinic. If the edges start to pull up, may reinforce with tape. If the dressing is no longer working, may remove and cover with gauze and tape, but must keep the area dry and clean.  Call with any questions or concerns.   08/31/18 0849   08/31/18 0000  Change dressing    Comments:  Maintain surgical dressing until follow up in the clinic. If the edges start to pull up, may reinforce with tape. If the dressing is no longer working, may remove and cover with gauze and tape, but must keep the area dry and clean.  Call with any questions or  concerns.   08/31/18 1117           Signed: [place] Pugh. Junaid Wurzer   PA-C  08/31/2018, 5:34 PM

## 2018-09-15 ENCOUNTER — Ambulatory Visit (HOSPITAL_COMMUNITY)
Admission: RE | Admit: 2018-09-15 | Discharge: 2018-09-15 | Disposition: A | Discharge status: Home / Self Care | Payer: PPO | Source: Ambulatory Visit | Attending: Cardiovascular Disease | Admitting: Cardiovascular Disease

## 2018-09-15 ENCOUNTER — Other Ambulatory Visit (HOSPITAL_COMMUNITY): Payer: Self-pay | Admitting: Orthopedic Surgery

## 2018-09-15 DIAGNOSIS — M79661 Pain in right lower leg: Secondary | ICD-10-CM

## 2018-09-15 DIAGNOSIS — M79662 Pain in left lower leg: Secondary | ICD-10-CM | POA: Diagnosis not present

## 2018-09-15 DIAGNOSIS — M79604 Pain in right leg: Secondary | ICD-10-CM | POA: Insufficient documentation

## 2018-09-20 ENCOUNTER — Emergency Department (HOSPITAL_BASED_OUTPATIENT_CLINIC_OR_DEPARTMENT_OTHER)
Admission: EM | Admit: 2018-09-20 | Discharge: 2018-09-20 | Disposition: A | Discharge status: Home / Self Care | Payer: PPO | Attending: Emergency Medicine | Admitting: Emergency Medicine

## 2018-09-20 ENCOUNTER — Encounter (HOSPITAL_BASED_OUTPATIENT_CLINIC_OR_DEPARTMENT_OTHER): Payer: Self-pay | Admitting: Emergency Medicine

## 2018-09-20 ENCOUNTER — Encounter (HOSPITAL_COMMUNITY)
Admission: EM | Disposition: A | Discharge status: Home / Self Care | Payer: Self-pay | Source: Home / Self Care | Attending: Emergency Medicine

## 2018-09-20 ENCOUNTER — Emergency Department (HOSPITAL_COMMUNITY): Payer: PPO

## 2018-09-20 ENCOUNTER — Emergency Department (HOSPITAL_COMMUNITY): Payer: PPO | Admitting: Certified Registered Nurse Anesthetist

## 2018-09-20 ENCOUNTER — Other Ambulatory Visit: Payer: Self-pay

## 2018-09-20 ENCOUNTER — Emergency Department (HOSPITAL_BASED_OUTPATIENT_CLINIC_OR_DEPARTMENT_OTHER): Payer: PPO

## 2018-09-20 DIAGNOSIS — R2689 Other abnormalities of gait and mobility: Secondary | ICD-10-CM | POA: Insufficient documentation

## 2018-09-20 DIAGNOSIS — Z79899 Other long term (current) drug therapy: Secondary | ICD-10-CM | POA: Insufficient documentation

## 2018-09-20 DIAGNOSIS — R52 Pain, unspecified: Secondary | ICD-10-CM | POA: Diagnosis not present

## 2018-09-20 DIAGNOSIS — Z7982 Long term (current) use of aspirin: Secondary | ICD-10-CM | POA: Insufficient documentation

## 2018-09-20 DIAGNOSIS — E039 Hypothyroidism, unspecified: Secondary | ICD-10-CM | POA: Diagnosis not present

## 2018-09-20 DIAGNOSIS — Y999 Unspecified external cause status: Secondary | ICD-10-CM | POA: Diagnosis not present

## 2018-09-20 DIAGNOSIS — S72111A Displaced fracture of greater trochanter of right femur, initial encounter for closed fracture: Secondary | ICD-10-CM | POA: Diagnosis not present

## 2018-09-20 DIAGNOSIS — S73004A Unspecified dislocation of right hip, initial encounter: Secondary | ICD-10-CM | POA: Diagnosis present

## 2018-09-20 DIAGNOSIS — T84020A Dislocation of internal right hip prosthesis, initial encounter: Secondary | ICD-10-CM | POA: Diagnosis not present

## 2018-09-20 DIAGNOSIS — Z419 Encounter for procedure for purposes other than remedying health state, unspecified: Secondary | ICD-10-CM

## 2018-09-20 DIAGNOSIS — Y829 Unspecified medical devices associated with adverse incidents: Secondary | ICD-10-CM | POA: Insufficient documentation

## 2018-09-20 DIAGNOSIS — Y92002 Bathroom of unspecified non-institutional (private) residence single-family (private) house as the place of occurrence of the external cause: Secondary | ICD-10-CM | POA: Insufficient documentation

## 2018-09-20 DIAGNOSIS — Y939 Activity, unspecified: Secondary | ICD-10-CM | POA: Insufficient documentation

## 2018-09-20 DIAGNOSIS — X58XXXA Exposure to other specified factors, initial encounter: Secondary | ICD-10-CM | POA: Diagnosis not present

## 2018-09-20 DIAGNOSIS — Z96641 Presence of right artificial hip joint: Secondary | ICD-10-CM | POA: Diagnosis not present

## 2018-09-20 DIAGNOSIS — Z136 Encounter for screening for cardiovascular disorders: Secondary | ICD-10-CM | POA: Diagnosis not present

## 2018-09-20 DIAGNOSIS — S73014A Posterior dislocation of right hip, initial encounter: Secondary | ICD-10-CM

## 2018-09-20 DIAGNOSIS — M79604 Pain in right leg: Secondary | ICD-10-CM | POA: Diagnosis not present

## 2018-09-20 HISTORY — PX: HIP CLOSED REDUCTION: SHX983

## 2018-09-20 LAB — BASIC METABOLIC PANEL
Anion gap: 9 (ref 5–15)
BUN: 23 mg/dL (ref 8–23)
CALCIUM: 8.4 mg/dL — AB (ref 8.9–10.3)
CO2: 25 mmol/L (ref 22–32)
Chloride: 98 mmol/L (ref 98–111)
Creatinine, Ser: 0.84 mg/dL (ref 0.44–1.00)
GLUCOSE: 118 mg/dL — AB (ref 70–99)
POTASSIUM: 3.8 mmol/L (ref 3.5–5.1)
SODIUM: 132 mmol/L — AB (ref 135–145)

## 2018-09-20 LAB — CBC
HCT: 22.1 % — ABNORMAL LOW (ref 36.0–46.0)
Hemoglobin: 7.5 g/dL — ABNORMAL LOW (ref 12.0–15.0)
MCH: 32.6 pg (ref 26.0–34.0)
MCHC: 33.9 g/dL (ref 30.0–36.0)
MCV: 96.1 fL (ref 78.0–100.0)
PLATELETS: 287 10*3/uL (ref 150–400)
RBC: 2.3 MIL/uL — AB (ref 3.87–5.11)
RDW: 14.9 % (ref 11.5–15.5)
WBC: 8.3 10*3/uL (ref 4.0–10.5)

## 2018-09-20 SURGERY — CLOSED REDUCTION, HIP
Anesthesia: General | Laterality: Right

## 2018-09-20 MED ORDER — SUCCINYLCHOLINE CHLORIDE 200 MG/10ML IV SOSY
PREFILLED_SYRINGE | INTRAVENOUS | Status: AC
  Filled 2018-09-20: qty 10

## 2018-09-20 MED ORDER — FUROSEMIDE 20 MG PO TABS: 20.0000 mg | ORAL_TABLET | Freq: Every day | ORAL | 0 refills | Status: AC | PRN

## 2018-09-20 MED ORDER — LACTATED RINGERS IV SOLN
INTRAVENOUS | Status: DC
  Administered 2018-09-20: 10:00:00 via INTRAVENOUS

## 2018-09-20 MED ORDER — ONDANSETRON HCL 4 MG/2ML IJ SOLN
INTRAMUSCULAR | Status: DC | PRN
  Administered 2018-09-20: 4 mg via INTRAVENOUS

## 2018-09-20 MED ORDER — PROPOFOL 10 MG/ML IV BOLUS
INTRAVENOUS | Status: AC | PRN
  Administered 2018-09-20 (×3): 20 mg via INTRAVENOUS
  Administered 2018-09-20: 40 mg via INTRAVENOUS

## 2018-09-20 MED ORDER — FENTANYL CITRATE (PF) 100 MCG/2ML IJ SOLN
INTRAMUSCULAR | Status: AC
  Filled 2018-09-20: qty 2

## 2018-09-20 MED ORDER — PROPOFOL 10 MG/ML IV BOLUS
INTRAVENOUS | Status: DC | PRN
  Administered 2018-09-20: 50 mg via INTRAVENOUS
  Administered 2018-09-20: 40 mg via INTRAVENOUS
  Administered 2018-09-20: 50 mg via INTRAVENOUS

## 2018-09-20 MED ORDER — FENTANYL CITRATE (PF) 100 MCG/2ML IJ SOLN: 25.0000 ug | INTRAMUSCULAR | Status: DC | PRN

## 2018-09-20 MED ORDER — ONDANSETRON HCL 4 MG/2ML IJ SOLN
INTRAMUSCULAR | Status: AC
  Filled 2018-09-20: qty 2

## 2018-09-20 MED ORDER — SODIUM CHLORIDE 0.9 % IV SOLN
INTRAVENOUS | Status: AC | PRN
  Administered 2018-09-20: 100 mL/h via INTRAVENOUS

## 2018-09-20 MED ORDER — HYDROMORPHONE HCL 1 MG/ML IJ SOLN
0.5000 mg | Freq: Once | INTRAMUSCULAR | Status: AC
  Administered 2018-09-20: 0.5 mg via INTRAVENOUS
  Filled 2018-09-20: qty 1

## 2018-09-20 MED ORDER — PROMETHAZINE HCL 25 MG/ML IJ SOLN: 6.2500 mg | INTRAMUSCULAR | Status: DC | PRN

## 2018-09-20 MED ORDER — SODIUM CHLORIDE 0.9 % IV BOLUS
500.0000 mL | Freq: Once | INTRAVENOUS | Status: AC
  Administered 2018-09-20: 500 mL via INTRAVENOUS

## 2018-09-20 MED ORDER — SUCCINYLCHOLINE CHLORIDE 20 MG/ML IJ SOLN
INTRAMUSCULAR | Status: DC | PRN
  Administered 2018-09-20: 60 mg via INTRAVENOUS

## 2018-09-20 MED ORDER — PROPOFOL 10 MG/ML IV BOLUS
60.0000 mg | Freq: Once | INTRAVENOUS | Status: DC
  Filled 2018-09-20: qty 20

## 2018-09-20 MED ORDER — LIDOCAINE 2% (20 MG/ML) 5 ML SYRINGE
INTRAMUSCULAR | Status: AC
  Filled 2018-09-20: qty 5

## 2018-09-20 MED ORDER — PROPOFOL 10 MG/ML IV BOLUS
INTRAVENOUS | Status: AC
  Filled 2018-09-20: qty 20

## 2018-09-20 MED ORDER — HYDROMORPHONE HCL 1 MG/ML IJ SOLN
0.5000 mg | INTRAMUSCULAR | Status: DC | PRN
  Administered 2018-09-20: 0.5 mg via INTRAVENOUS
  Filled 2018-09-20: qty 1

## 2018-09-20 SURGICAL SUPPLY — 2 items
IMMOBILIZER KNEE 20 (SOFTGOODS) ×3
IMMOBILIZER KNEE 20 THIGH 36 (SOFTGOODS) IMPLANT

## 2018-09-20 NOTE — ED Notes (Addendum)
Conference call with [facility] short stay and Doug, carelink regarding plan to pick pt up at 9:30 to have her in short stay at 10:30 per surgeon request.

## 2018-09-20 NOTE — Brief Op Note (Signed)
09/20/2018  4:43 PM  PATIENT:  April Brewer  72 y.o. female  PRE-OPERATIVE DIAGNOSIS:  Dislocated right total hip replacement  POST-OPERATIVE DIAGNOSIS:  Dislocated right total hip replacement  PROCEDURE:  Procedure(s): CLOSED REDUCTION HIP (Right)  SURGEON:  Surgeon(s) and Role:    Paralee Cancel, MD - Primary  PHYSICIAN ASSISTANT: Babish, PA-C   ANESTHESIA:   general  EBL:  None  BLOOD ADMINISTERED:none  DRAINS: none   LOCAL MEDICATIONS USED:  NONE  SPECIMEN:  No Specimen  DISPOSITION OF SPECIMEN:  N/A  COUNTS:  NO closed procedure, no instruments opened  TOURNIQUET:  * No tourniquets in log *  DICTATION: .Other Dictation: Dictation Number [id number]  PLAN OF CARE: Discharge to home after PACU  PATIENT DISPOSITION:  PACU - hemodynamically stable.   Delay start of Pharmacological VTE agent (>24hrs) due to surgical blood loss or risk of bleeding: not applicable

## 2018-09-20 NOTE — Sedation Documentation (Signed)
Awaiting for xray and ortho consult.

## 2018-09-20 NOTE — Consult Note (Signed)
Reason for Consult: Right total hip dislocation Referring Physician: [place] is an 72 y.o. female.  HPI: 72 yo female status post right total hip.  Presented to [facility] after feeling pop in hip when trying to get out of a chair. Pain in right hip Swelling right LE (DVT ruled out by ultrasound last week)  Past Medical History:  Diagnosis Date  . ALLERGIC RHINITIS 06/02/2007  . Allergy   . Arthritis    DDD of cervical, thoracic, and lumbar spine  . Blood transfusion without reported diagnosis   . Cataract    Bilateral removed cateracts  . Cervical spondylosis    C5-6 and C6-7.  Pain mgmt as per Dr. Nelva Bush.  . Chronic low back pain 02/18/2009   s/p fusion L3-4 through L5-S1.  Pain meds per Dr. Nelva Bush  . DEPRESSION 06/02/2007  . Diverticulosis   . GERD (gastroesophageal reflux disease)   . Heart murmur   . Hiatal hernia   . HYPOTHYROIDISM 06/02/2007   GOITER  . INSOMNIA 06/02/2007  . Lymphocytic colitis   . Muscle spasms of lower extremity   . Nasal septal perforation    chronic; hx of epistaxis  . Normocytic anemia 04/2015   HEME + in ED 08/07/15--endoscopic eval unrevealing.  Iron studies fine.  ?Anemia of chronic dz (lymphocytic colitis?)  . OSTEOPENIA 02/18/2009   Repeat DEXA 07/2014 showed osteoporosis by T score in radius but spine and hip T scores were normal-continue vit D and calcium and repeat DEXA 08/2016 unchanged.  Repeat DEXA 2 yrs.  . Peripheral edema    LE's, nonpitting    Past Surgical History:  Procedure Laterality Date  . ABDOMINAL HYSTERECTOMY     for DUB (ovaries are still in)  . BACK SURGERY     X 5: fusion of L3-L4 through L5-S1  . CATARACT EXTRACTION Bilateral   . CHOLECYSTECTOMY  2012  . COLONOSCOPY  06/2007; 01/2015   2008 Diverticulosis, o/w normal.  2016 showed lymphocytic colitis with mild diverticulosis: pt was started on oral budesonide at that time.  Marland Kitchen DEXA  08/16/2014; 09/14/16   Hip and spine ok; radius  osteoporotic---but meds not indicated.  Repeat 08/2018  . ESOPHAGOGASTRODUODENOSCOPY  10/2007; 11/27/15   Small hiatal hernia, o/w normal.  . SHOULDER SURGERY Right    \  . TOTAL HIP ARTHROPLASTY  05/31/2012   Procedure: TOTAL HIP ARTHROPLASTY ANTERIOR APPROACH;  Surgeon: Mauri Pole, MD;  Location: [facility];  Service: Orthopedics;  Laterality: Left;  . TOTAL HIP ARTHROPLASTY Right 08/30/2018   Procedure: RIGHT TOTAL HIP ARTHROPLASTY ANTERIOR APPROACH;  Surgeon: Paralee Cancel, MD;  Location: [facility];  Service: Orthopedics;  Laterality: Right;  70 mins    Family History  Problem Relation Age of Onset  . Arthritis Mother   . Stroke Mother   . Breast cancer Mother   . Diabetes Mother   . Colon cancer Father   . Heart disease Father   . Heart disease Sister   . Breast cancer Sister   . Lung cancer Brother        smoked  . Uterine cancer Sister   . Esophageal cancer Neg Hx   . Rectal cancer Neg Hx   . Stomach cancer Neg Hx     Social History:  reports that she has never smoked. She has never used smokeless tobacco. She reports that she does not drink alcohol or use drugs.  Allergies:  Allergies  Allergen  Reactions  . Latex Rash    Medications:  I have reviewed the patient's current medications. Scheduled: . [MAR Hold] propofol  60 mg Intravenous Once    Results for orders placed or performed during the hospital encounter of 09/20/18 (from the past 24 hour(s))  CBC     Status: Abnormal   Collection Time: 09/20/18  8:14 AM  Result Value Ref Range   WBC 8.3 4.0 - 10.5 K/uL   RBC 2.30 (L) 3.87 - 5.11 MIL/uL   Hemoglobin 7.5 (L) 12.0 - 15.0 g/dL   HCT 22.1 (L) 36.0 - 46.0 %   MCV 96.1 78.0 - 100.0 fL   MCH 32.6 26.0 - 34.0 pg   MCHC 33.9 30.0 - 36.0 g/dL   RDW 14.9 11.5 - 15.5 %   Platelets 287 150 - 400 K/uL  Basic metabolic panel     Status: Abnormal   Collection Time: 09/20/18  8:14 AM  Result Value Ref Range   Sodium 132 (L) 135 - 145 mmol/L   Potassium 3.8 3.5 - 5.1  mmol/L   Chloride 98 98 - 111 mmol/L   CO2 25 22 - 32 mmol/L   Glucose, Bld 118 (H) 70 - 99 mg/dL   BUN 23 8 - 23 mg/dL   Creatinine, Ser 0.84 0.44 - 1.00 mg/dL   Calcium 8.4 (L) 8.9 - 10.3 mg/dL   GFR calc non Af Amer >60 >60 mL/min   GFR calc Af Amer >60 >60 mL/min   Anion gap 9 5 - 15    X-ray: CLINICAL DATA:  72 year old female with a history of right hip dislocation  EXAM: DG HIP (WITH OR WITHOUT PELVIS) 2-3V RIGHT  COMPARISON:  08/30/2018  FINDINGS: Osteopenia.  Bony pelvic ring intact.  Interval proximal dislocation of the right arthroplasty components, with associated avulsion fracture of the greater trochanter.  Components of left hip arthroplasty maintain alignment.  Associated soft tissue swelling.  IMPRESSION: Dislocation of right hip arthroplasty components, with associated avulsion fracture of the greater trochanter.  Osteopenia.  Left hip arthroplasty components maintain alignment.   Electronically Signed   By: Corrie Mckusick D.O.   ROS  Per HPI Recent right THR  Blood pressure 127/67, pulse 77, temperature 98 F (36.7 C), temperature source Oral, resp. rate 16, height 5\' 4"  (1.626 m), weight 61.7 kg, SpO2 100 %.  Physical Exam  Awake alert NVI RLE Right LE shortened General medical exam per ER evaluation  Assessment/Plan: Dislocation right THR with associated relatively small avulsion fracture of greater trochanter  Plan Transferred to [facility] for closed reduction after failed attempt in [place] op knee immobilizer  RTC in 3 weeks Posterior hip precautions Lasix for edema  Mauri Pole 09/20/2018, 11:46 AM

## 2018-09-20 NOTE — ED Notes (Signed)
[facility] notified [facility]) - page Dr. Alvan Dame

## 2018-09-20 NOTE — ED Notes (Signed)
[facility]link at [employer] given. After moving from stretcher to gurney pt states her pain is increased to 5/10, medicated per prn order at pt request.

## 2018-09-20 NOTE — ED Triage Notes (Signed)
Pt brought to ED by [facility] from home for a right side hip dislocation, pt had a hip replacement on 08/30/2018 and today while she was on the bathroom she felt a pop. Pt has some right side leg short and swollen on the right hip. Pt got 150 mcg Fentanyl pta. VS 140/60, HR 88, SPO2 98% RA.

## 2018-09-20 NOTE — Discharge Instructions (Addendum)
Keep the knee immobilizer on at all times, even while sleeping. Use walker to ambulate.

## 2018-09-20 NOTE — Anesthesia Preprocedure Evaluation (Signed)
Anesthesia Evaluation  Patient identified by MRN, date of birth, ID band Patient awake    Reviewed: Allergy & Precautions, NPO status , Patient's Chart, lab work & pertinent test results  Airway Mallampati: II  TM Distance: >3 FB Neck ROM: Full    Dental no notable dental hx.    Pulmonary neg pulmonary ROS,    Pulmonary exam normal breath sounds clear to auscultation       Cardiovascular hypertension, Normal cardiovascular exam Rhythm:Regular Rate:Normal     Neuro/Psych negative neurological ROS  negative psych ROS   GI/Hepatic negative GI ROS, Neg liver ROS,   Endo/Other  Hypothyroidism   Renal/GU negative Renal ROS  negative genitourinary   Musculoskeletal negative musculoskeletal ROS (+)   Abdominal   Peds negative pediatric ROS (+)  Hematology negative hematology ROS (+) anemia ,   Anesthesia Other Findings   Reproductive/Obstetrics negative OB ROS                             Anesthesia Physical Anesthesia Plan  ASA: III  Anesthesia Plan: General   Post-op Pain Management:    Induction: Intravenous  PONV Risk Score and Plan: 0 and Ondansetron  Airway Management Planned: Mask  Additional Equipment:   Intra-op Plan:   Post-operative Plan:   Informed Consent: I have reviewed the patients History and Physical, chart, labs and discussed the procedure including the risks, benefits and alternatives for the proposed anesthesia with the patient or authorized representative who has indicated his/her understanding and acceptance.   Dental advisory given  Plan Discussed with: CRNA and Surgeon  Anesthesia Plan Comments:         Anesthesia Quick Evaluation

## 2018-09-20 NOTE — ED Notes (Signed)
[facility] notified [facility]) - asked to page Dr. Alvan Dame

## 2018-09-20 NOTE — Sedation Documentation (Signed)
Pt back from X-ray.  

## 2018-09-20 NOTE — Op Note (Signed)
NAME: April Brewer, HAIDAR MEDICAL RECORD [id number] ACCOUNT [id number] DATE OF BIRTH:[birth year] FACILITY: [facility] LOCATION: [facility] PHYSICIAN:Jessamyn Watterson Marian Sorrow, MD  OPERATIVE REPORT  DATE OF PROCEDURE:  09/20/2018  PREOPERATIVE DIAGNOSIS:  Dislocated right total hip arthroplasty.  POSTOPERATIVE DIAGNOSIS:  Dislocated right total hip arthroplasty, avulsion fracture of the greater trochanter.    PROCEDURE:  Closed reduction of right total hip arthroplasty under general anesthesia.  SURGEON:  Paralee Cancel, MD  ASSISTANT:  Danae Orleans, who helped maintain pressure on the pelvis during reduction.  ANESTHESIA:  General.  COMPLICATIONS:  None evident.  Postoperative radiographs indicated reduced total hip.  INDICATIONS FOR PROCEDURE:  The patient is a very pleasant 72 year old female who is now about 3 weeks out from a right total hip arthroplasty.  She had been doing well in followup other than some right lower extremity swelling, at which point rule out  DVT.  She was getting up from a seated position where she felt a pop and dislocation of the right hip.  She had been noting some popping sensation in the right hip, which was reviewed with her and her son.  She was brought to the med center at [facility]  where radiographs revealed a dislocated right hip.  Failed attempt at this medical center resulted in her being transferred to [facility] for closed reduction.  She was seen and evaluated and I discussed the treatment plan modifications necessary.   Consent was obtained for pain relief and reduction of the hip.  Postoperative plan reviewed.  DESCRIPTION OF PROCEDURE:  The patient was brought to the operative theater.  Once adequate anesthesia was established, a Foley catheter was placed and inserted for drainage of her bladder resulting in a 1200 mL removal of urine.  We then performed a  timeout identifying the patient, the planned procedure and extremity.  Once succinylcholine was  administered, her pelvis was maintained in stable position as I flexed and internally rotated her hip, which was noted to be posteriorly dislocated.  I was  then able to externally rotate and reduce the hip and restored her length.  I did not reduce with an audible clunk.  This indicates relatively lax soft tissues.  Her leg lengths were equal.  A portable x-ray was taken at that time indicating a reduced  hip without complications or subluxation.  She was placed in a knee immobilizer and then transferred to the recovery room in stable condition.  We will have her evaluated by physical therapy to review posterior hip precautions.  She will use a knee immobilizer for 4 to 6 weeks.  She can be weightbearing as tolerated with the use of a walker.  I will see her back in the office routine followup in  3 weeks based on her last office visit.  Findings reviewed in the note.  TN/NUANCE  D:09/20/2018 T:09/20/2018 JOB:[id number]

## 2018-09-20 NOTE — Anesthesia Procedure Notes (Signed)
Procedure Name: General with mask airway Date/Time: 09/20/2018 1:10 PM Performed by: Maxwell Caul, CRNA Pre-anesthesia Checklist: Patient identified, Suction available, Patient being monitored and Emergency Drugs available Patient Re-evaluated:Patient Re-evaluated prior to induction Oxygen Delivery Method: Circle system utilized Preoxygenation: Pre-oxygenation with 100% oxygen Induction Type: IV induction Ventilation: Mask ventilation without difficulty

## 2018-09-20 NOTE — Evaluation (Signed)
Physical Therapy1x  Evaluation Patient Details Name: April Brewer MRN: [id number] DOB: 05-30-1946 Today's Date: 09/20/2018   History of Present Illness  pt s/p R THA on 08/30/2018 Director anterior by Dr. Alvan Dame. had been progressing well at home and using cane with 24 care from CNA and /or son. Last night felt a pop while getting off the commade and then could not get her leg to work for her. She sat for about 4 hours until she was able to get help. Today, 09/20/2018 Dr. Alvan Dame performed closed reduction if the R hip.   Clinical Impression  Pt in PACU after closed reduction of dislocated R hip. Reviewed mobility while adhering to posterior hip precautions as ordered by MD. Also reviewed KI wear and checking with patient and son. Reviewed tranfers and ambulation with handouts given for precautions and limited exercises as well. Exercises for glut and quad sets and limited hip abd since pt was instructed to wear KI at all times and WBAT.All education and mobility completed.      Follow Up Recommendations Follow surgeon's recommendation for DC plan and follow-up therapies    Equipment Recommendations  None recommended by PT    Recommendations for Other Services       Precautions / Restrictions Precautions Precautions: Posterior Hip Precaution Booklet Issued: Yes (comment) Precaution Comments: educated patient and son with handout, and demonstration for supine position, sitting, during mobility and precautions while ambulating. They both agreed and understood the precautions.  Required Braces or Orthoses: Knee Immobilizer - Right(educated pt and son about improtance of skin checking, check for all areas, no reddness or swelling, This brace is not for a broken bone, but for a reminder for precatuions and to decrease mobility a little in order to tighten up the hip socket some. ) Knee Immobilizer - Right: On at all times Restrictions Weight Bearing Restrictions: No(WBAT )      Mobility  Bed  Mobility Overal bed mobility: (not tested pt in PACU only in recliner; )                Transfers Overall transfer level: Needs assistance Equipment used: Rolling walker (2 wheeled) Transfers: Sit to/from Stand Sit to Stand: Min assist         General transfer comment: little assistance due to pt trying not to bend forward for sit to stand and needed a little asssit to boost up while keeping trunk upright as well.   Ambulation/Gait Ambulation/Gait assistance: Supervision Gait Distance (Feet): 40 Feet(did not need to ambulate further , just wanted to review steppign with KI and RW at this time) Assistive device: Rolling walker (2 wheeled) Gait Pattern/deviations: Step-to pattern Gait velocity: decr      Stairs Stairs: (reviwed verbally and with demonstration)          Wheelchair Mobility    Modified Rankin (Stroke Patients Only)       Balance                                             Pertinent Vitals/Pain Pain Assessment: No/denies pain Pain Score: 1  Pain Location: L hip Pain Descriptors / Indicators: Aching Pain Intervention(s): Monitored during session    Home Living Family/patient expects to be discharged to:: Private residence Living Arrangements: Children;Other relatives Available Help at Discharge: Family;Available 24 hours/day Type of Home: House Home Access: Level entry  Home Layout: Two level Home Equipment: Walker - 2 wheels;Cane - single point;Bedside commode Additional Comments: niece and son will assist    Prior Function Level of Independence: Independent;Independent with assistive device(s)               Hand Dominance        Extremity/Trunk Assessment        Lower Extremity Assessment Lower Extremity Assessment: Overall WFL for tasks assessed(noted some swelling pitting edema in R lower leg, educated son on ankle pumps and watching for swelling)       Communication   Communication: No  difficulties  Cognition Arousal/Alertness: Awake/alert Behavior During Therapy: WFL for tasks assessed/performed Overall Cognitive Status: Within Functional Limits for tasks assessed                                        General Comments      Exercises Total Joint Exercises Ankle Circles/Pumps: AROM;Right;10 reps Quad Sets: AROM;10 reps;Right Gluteal Sets: AROM;10 reps;Both Hip ABduction/ADduction: AAROM;Right;10 reps(small movements and toes up to ceiling )   Assessment/Plan    PT Assessment Patent does not need any further PT services  PT Problem List         PT Treatment Interventions      PT Goals (Current goals can be found in the Care Plan section)  Acute Rehab PT Goals PT Goal Formulation: All assessment and education complete, DC therapy    Frequency     Barriers to discharge        Co-evaluation               AM-PAC PT "6 Clicks" Daily Activity  Outcome Measure Difficulty turning over in bed (including adjusting bedclothes, sheets and blankets)?: A Little Difficulty moving from lying on back to sitting on the side of the bed? : A Little Difficulty sitting down on and standing up from a chair with arms (e.g., wheelchair, bedside commode, etc,.)?: A Little Help needed moving to and from a bed to chair (including a wheelchair)?: A Little Help needed walking in hospital room?: A Little Help needed climbing 3-5 steps with a railing? : A Little 6 Click Score: 18    End of Session Equipment Utilized During Treatment: Gait belt Activity Tolerance: Patient tolerated treatment well Patient left: in chair;with nursing/sitter in room;with family/visitor present Nurse Communication: Mobility status PT Visit Diagnosis: Other abnormalities of gait and mobility (R26.89)    Time: 2094-7096 PT Time Calculation (min) (ACUTE ONLY): 34 min   Charges:   PT Evaluation $PT Eval Low Complexity: 1 Low PT Treatments $Therapeutic Exercise: 8-22  mins        Clide Dales, PT Acute Rehabilitation Services Pager: [phone] Office: [phone] 09/20/2018   Clide Dales 09/20/2018, 5:03 PM

## 2018-09-20 NOTE — ED Provider Notes (Addendum)
[facility] Provider Note  CSN: [id number] Arrival date & time: 09/20/18 4481  Chief Complaint(s) Hip Injury  HPI April Brewer is a 72 y.o. female with a history of arthritis who had an anterior approach total right hip replacement on 9/3 presents to the emergency department with right hip pain.  At 11:00 the patient got up to go to the bathroom and while sitting on the toilet she felt clicking in her right hip.  She was unable to stand and remained sitting on the toilet for 3 - 4 hours before she was able to call her husband for help.  EMS was called who noted that the patient's right lower extremity was short and internally rotated.  Pain was exacerbated with movement and range of motion.  She is endorsed numbness of the right hip since the surgery, unchanged.  She denies any trauma or fall causing the pain.  Denies any pain below the hip.  Low back pain.  Denies any other physical complaints.  EMS gave the patient 150 mcg of fentanyl prior to arrival.  Remained hemodynamically stable.  HPI  Past Medical History Past Medical History:  Diagnosis Date  . ALLERGIC RHINITIS 06/02/2007  . Allergy   . Arthritis    DDD of cervical, thoracic, and lumbar spine  . Blood transfusion without reported diagnosis   . Cataract    Bilateral removed cateracts  . Cervical spondylosis    C5-6 and C6-7.  Pain mgmt as per Dr. Nelva Bush.  . Chronic low back pain 02/18/2009   s/p fusion L3-4 through L5-S1.  Pain meds per Dr. Nelva Bush  . DEPRESSION 06/02/2007  . Diverticulosis   . GERD (gastroesophageal reflux disease)   . Heart murmur   . Hiatal hernia   . HYPOTHYROIDISM 06/02/2007   GOITER  . INSOMNIA 06/02/2007  . Lymphocytic colitis   . Muscle spasms of lower extremity   . Nasal septal perforation    chronic; hx of epistaxis  . Normocytic anemia 04/2015   HEME + in ED 08/07/15--endoscopic eval unrevealing.  Iron studies fine.  ?Anemia of chronic dz (lymphocytic colitis?)  .  OSTEOPENIA 02/18/2009   Repeat DEXA 07/2014 showed osteoporosis by T score in radius but spine and hip T scores were normal-continue vit D and calcium and repeat DEXA 08/2016 unchanged.  Repeat DEXA 2 yrs.  . Peripheral edema    LE's, nonpitting   Patient Active Problem List   Diagnosis Date Noted  . 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 Normocytic anemia 02/13/2016  . Lymphocytic colitis 03/14/2015  . Dyspnea 01/11/2014  . Venous insufficiency 08/23/2013  . Peripheral edema 08/23/2013  . Anxiety and depression 08/23/2013  . Hyponatremia 05/19/2013  . Cough, persistent 08/31/2012  . 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 OSTEOPENIA 02/18/2009  . GOITER 06/02/2007  . Hypothyroidism 06/02/2007  . DEPRESSION 06/02/2007  . HYPERTENSION 06/02/2007  . ALLERGIC RHINITIS 06/02/2007  . GERD 06/02/2007  . INSOMNIA 06/02/2007   Home Medication(s) Prior to Admission medications   Medication Sig Start Date End Date Taking? Authorizing Provider  acetaminophen (TYLENOL) 500 MG tablet Take 2 tablets (1,000 mg total) by mouth every 8 (eight) hours. 08/31/18   Danae Orleans, PA-C  alprazolam Duanne Moron) 2 MG tablet Take one-half to one tablet by mouth twice daily as needed Patient taking differently: Take 1-2 mg by mouth See admin instructions. Take 1 mg by mouth in the morning and 2 mg by mouth in the evening 08/04/17  McGowen, Adrian Blackwater, MD  aspirin (ASPIRIN CHILDRENS) 81 MG chewable tablet Chew 1 tablet (81 mg total) by mouth 2 (two) times daily. Take for 4 weeks, then resume regular dose. 09/01/18 10/01/18  Danae Orleans, PA-C  atorvastatin (LIPITOR) 10 MG tablet Take 10 mg by mouth 2 (two) times a week. 06/12/18   [provider]  dicyclomine (BENTYL) 10 MG capsule TAKE ONE CAPSULE BY MOUTH  THREE OR FOUR TIMES DAILY  AS NEEDED FOR CRAMPING/PAIN Patient not taking: Reported on 08/17/2018 08/24/16   Ladene Artist, MD  docusate sodium (COLACE) 100 MG capsule Take 1 capsule (100 mg total) by mouth 2 (two)  times daily. 08/31/18   Danae Orleans, PA-C  ferrous sulfate (FERROUSUL) 325 (65 FE) MG tablet Take 1 tablet (325 mg total) by mouth 3 (three) times daily with meals. 08/31/18   Danae Orleans, PA-C  Iron-FA-B Cmp-C-Biot-Probiotic (FUSION PLUS) CAPS Take 1 capsule by mouth daily. 07/20/18   [provider]  levothyroxine (SYNTHROID, LEVOTHROID) 75 MCG tablet TAKE 1 TABLET BY MOUTH ONCE DAILY WITH BREAKFAST Patient taking differently: Take 75 mcg by mouth daily before breakfast.  02/09/18   McGowen, Adrian Blackwater, MD  methocarbamol (ROBAXIN) 500 MG tablet Take 1 tablet (500 mg total) by mouth every 6 (six) hours as needed for muscle spasms. 08/31/18   Danae Orleans, PA-C  montelukast (SINGULAIR) 10 MG tablet Take 1 tablet (10 mg total) by mouth at bedtime. Patient needs follow up visit for further refills Patient not taking: Reported on 08/17/2018 05/16/18   Tammi Sou, MD  omeprazole (PRILOSEC) 40 MG capsule TAKE 1 CAPSULE BY MOUTH  DAILY Patient taking differently: Take 40 mg by mouth daily as needed (heartburn).  02/09/18   McGowen, Adrian Blackwater, MD  oxyCODONE (OXY IR/ROXICODONE) 5 MG immediate release tablet Take 1-2 tablets (5-10 mg total) by mouth every 4 (four) hours as needed for moderate pain or severe pain. 08/31/18   Danae Orleans, PA-C  polyethylene glycol (MIRALAX / GLYCOLAX) packet Take 17 g by mouth 2 (two) times daily. 08/31/18   Danae Orleans, PA-C  sertraline (ZOLOFT) 100 MG tablet TAKE ONE AND ONE-HALF  TABLET BY MOUTH NIGHTLY AT  BEDTIME Patient taking differently: Take 50-100 mg by mouth 2 (two) times daily.  02/09/18   McGowen, Adrian Blackwater, MD  traZODone (DESYREL) 50 MG tablet TAKE 4 TABLETS BY MOUTH  EVERY NIGHT AT BEDTIME AS  NEEDED FOR INSOMNIA Patient taking differently: Take 200 mg by mouth at bedtime.  02/16/17   McGowen, Adrian Blackwater, MD  triamterene-hydrochlorothiazide (MAXZIDE-25) 37.5-25 MG tablet Take 1 tablet by mouth daily as needed (swelling).  07/18/18   [provider]                                                                                                                                    Past Surgical History Past Surgical History:  Procedure Laterality Date  .  ABDOMINAL HYSTERECTOMY     for DUB (ovaries are still in)  . BACK SURGERY     X 5: fusion of L3-L4 through L5-S1  . CATARACT EXTRACTION Bilateral   . CHOLECYSTECTOMY  2012  . COLONOSCOPY  06/2007; 01/2015   2008 Diverticulosis, o/w normal.  2016 showed lymphocytic colitis with mild diverticulosis: pt was started on oral budesonide at that time.  Marland Kitchen DEXA  08/16/2014; 09/14/16   Hip and spine ok; radius osteoporotic---but meds not indicated.  Repeat 08/2018  . ESOPHAGOGASTRODUODENOSCOPY  10/2007; 11/27/15   Small hiatal hernia, o/w normal.  . SHOULDER SURGERY Right    \  . TOTAL HIP ARTHROPLASTY  05/31/2012   Procedure: TOTAL HIP ARTHROPLASTY ANTERIOR APPROACH;  Surgeon: Mauri Pole, MD;  Location: [facility];  Service: Orthopedics;  Laterality: Left;  . TOTAL HIP ARTHROPLASTY Right 08/30/2018   Procedure: RIGHT TOTAL HIP ARTHROPLASTY ANTERIOR APPROACH;  Surgeon: Paralee Cancel, MD;  Location: [facility];  Service: Orthopedics;  Laterality: Right;  70 mins   Family History Family History  Problem Relation Age of Onset  . Arthritis Mother   . Stroke Mother   . Breast cancer Mother   . Diabetes Mother   . Colon cancer Father   . Heart disease Father   . Heart disease Sister   . Breast cancer Sister   . Lung cancer Brother        smoked  . Uterine cancer Sister   . Esophageal cancer Neg Hx   . Rectal cancer Neg Hx   . Stomach cancer Neg Hx     Social History Social History   Tobacco Use  . Smoking status: Never Smoker  . Smokeless tobacco: Never Used  Substance Use Topics  . Alcohol use: No    Alcohol/week: 0.0 standard drinks  . Drug use: No   Allergies Latex  Review of Systems Review of Systems All other systems are reviewed and are negative for acute change  except as noted in the HPI  Physical Exam Vital Signs  I have reviewed the triage vital signs BP 110/62   Pulse 73   Temp 98.7 F (37.1 C) (Oral)   Resp 16   Ht 5\' 4"  (1.626 m)   Wt 61.7 kg   SpO2 100%   BMI 23.34 kg/m   Physical Exam  Constitutional: She is oriented to person, place, and time. She appears well-developed and well-nourished. No distress.  HENT:  Head: Normocephalic and atraumatic.  Right Ear: External ear normal.  Left Ear: External ear normal.  Nose: Nose normal.  Eyes: Conjunctivae and EOM are normal. No scleral icterus.  Neck: Normal range of motion and phonation normal.  Cardiovascular: Normal rate and regular rhythm.  Pulses:      Dorsalis pedis pulses are 1+ on the right side, and 1+ on the left side.  Pulmonary/Chest: Effort normal. No stridor. No respiratory distress.  Abdominal: She exhibits no distension.  Musculoskeletal: Normal range of motion. She exhibits no edema.       Right hip: She exhibits tenderness (mild discomfort) and deformity ( short and internally rotated).       Legs: BLE edema  Neurological: She is alert and oriented to person, place, and time.  Skin: She is not diaphoretic.  Psychiatric: She has a normal mood and affect. Her behavior is normal.  Vitals reviewed.   ED Results and Treatments Labs (all labs ordered are listed, but only abnormal results are displayed) Labs Reviewed - No data to  display                                                                                                                       EKG  EKG Interpretation  Date/Time:    Ventricular Rate:    PR Interval:    QRS Duration:   QT Interval:    QTC Calculation:   R Axis:     Text Interpretation:        Radiology Dg Hip Unilat W Or W/o Pelvis 2-3 Views Right  Result Date: 09/20/2018 CLINICAL DATA:  71 year old female with a history of right hip dislocation EXAM: DG HIP (WITH OR WITHOUT PELVIS) 2-3V RIGHT COMPARISON:  08/30/2018 FINDINGS:  Osteopenia. Bony pelvic ring intact. Interval proximal dislocation of the right arthroplasty components, with associated avulsion fracture of the greater trochanter. Components of left hip arthroplasty maintain alignment. Associated soft tissue swelling. IMPRESSION: Dislocation of right hip arthroplasty components, with associated avulsion fracture of the greater trochanter. Osteopenia. Left hip arthroplasty components maintain alignment. Electronically Signed   By: Corrie Mckusick D.O.   On: 09/20/2018 06:39   Pertinent labs & imaging results that were available during my care of the patient were reviewed by me and considered in my medical decision making (see chart for details).  Medications Ordered in ED Medications  propofol (DIPRIVAN) 10 mg/mL bolus/IV push 60 mg (0 mg Intravenous Hold 09/20/18 0616)  HYDROmorphone (DILAUDID) injection 0.5 mg (has no administration in time range)  propofol (DIPRIVAN) 10 mg/mL bolus/IV push (20 mg Intravenous Given 09/20/18 0606)  0.9 %  sodium chloride infusion (100 mL/hr Intravenous New Bag/Given 09/20/18 0522)                                                                                                                                    Procedures .Ortho Injury Treatment Date/Time: 09/20/2018 6:42 AM Performed by: Fatima Blank, MD Authorized by: Fatima Blank, MD   Consent:    Consent obtained:  Written   Consent given by:  Patient   Risks discussed:  Irreducible dislocation and fracture   Alternatives discussed:  Delayed treatmentInjury location: hip Location details: right hip Injury type: dislocation Dislocation type: posterior Spontaneous dislocation: yes Prosthesis: yes Pre-procedure neurovascular assessment: neurovascularly intact  Patient sedated: Yes. Refer to sedation procedure documentation for details of sedation. Manipulation performed: yes Reduction method: Allis, Cpt Lilia Pro, and Waddell. Reduction successful:  successfull twice,  but redislocated. Post-procedure neurovascular assessment: post-procedure neurovascularly intact  .Sedation Date/Time: 09/20/2018 6:48 AM Performed by: Fatima Blank, MD Authorized by: Fatima Blank, MD   Consent:    Consent obtained:  Verbal   Consent given by:  Patient   Risks discussed:  Allergic reaction, dysrhythmia, inadequate sedation, nausea, prolonged hypoxia resulting in organ damage, prolonged sedation necessitating reversal, respiratory compromise necessitating ventilatory assistance and intubation and vomiting   Alternatives discussed:  Analgesia without sedation, anxiolysis and regional anesthesia Universal protocol:    Procedure explained and questions answered to patient or proxy's satisfaction: yes     Relevant documents present and verified: yes     Test results available and properly labeled: yes     Imaging studies available: yes     Required blood products, implants, devices, and special equipment available: yes     Site/side marked: yes     Immediately prior to procedure a time out was called: yes     Patient identity confirmation method:  Verbally with patient Indications:    Procedure necessitating sedation performed by:  Physician performing sedation Pre-sedation assessment:    Time since last food or drink:  2000   ASA classification: class 2 - patient with mild systemic disease     Neck mobility: normal     Mouth opening:  3 or more finger widths   Thyromental distance:  4 finger widths   Mallampati score:  II - soft palate, uvula, fauces visible   Pre-sedation assessments completed and reviewed: airway patency, cardiovascular function, hydration status, mental status, nausea/vomiting, pain level, respiratory function and temperature     Pre-sedation assessment completed:  09/20/2018 5:30 AM Immediate pre-procedure details:    Reassessment: Patient reassessed immediately prior to procedure     Reviewed: vital signs,  relevant labs/tests and NPO status     Verified: bag valve mask available, emergency equipment available, intubation equipment available, IV patency confirmed, oxygen available and suction available   Procedure details (see MAR for exact dosages):    Preoxygenation:  Nasal cannula   Sedation:  Propofol   Intra-procedure monitoring:  Blood pressure monitoring, cardiac monitor, continuous pulse oximetry, frequent LOC assessments, frequent vital sign checks and continuous capnometry   Intra-procedure events: none     Total Provider sedation time (minutes):  20 Post-procedure details:    Post-sedation assessment completed:  09/20/2018 6:31 AM   Attendance: Constant attendance by certified staff until patient recovered     Recovery: Patient returned to pre-procedure baseline     Post-sedation assessments completed and reviewed: airway patency, cardiovascular function, hydration status, mental status, nausea/vomiting, pain level, respiratory function and temperature     Patient is stable for discharge or admission: yes     Patient tolerance:  Tolerated well, no immediate complications    (including critical care time)  Medical Decision Making / ED Course I have reviewed the nursing notes for this encounter and the patient's prior records (if available in EHR or on provided paperwork).     Right prosthetic hip dislocation.  Neurovascularly intact distally.  Confirmed on plain film with avulsion fracture of greater trochanter.  Dr. Alvan Dame from [facility] orth[facility]ics who performed the surgery was on-call and on board with reduction in the emergency department.  Hip was relocation under conscious sedation.  Was able to relocate the hip however spontaneously this located again twice.  Informed Dr. Alvan Dame who will coordinate for reduction in the OR.  Final Clinical Impression(s) / ED Diagnoses Final diagnoses:  Closed  posterior dislocation of right hip, initial encounter [facility])      This chart  was dictated using voice recognition software.  Despite best efforts to proofread,  errors can occur which can change the documentation meaning.   Fatima Blank, MD 09/20/18 [place], [place], MD 09/27/18 [phone]

## 2018-09-20 NOTE — Transfer of Care (Signed)
Immediate Anesthesia Transfer of Care Note  Patient: April Brewer  Procedure(s) Performed: CLOSED REDUCTION HIP (Right )  Patient Location: PACU  Anesthesia Type:General  Level of Consciousness: awake, alert  and oriented  Airway & Oxygen Therapy: Patient Spontanous Breathing and Patient connected to face mask oxygen  Post-op Assessment: Report given to RN and Post -op Vital signs reviewed and stable  Post vital signs: Reviewed and stable  Last Vitals:  Vitals Value Taken Time  BP 116/65 09/20/2018  1:35 PM  Temp 36.8 C 09/20/2018  1:35 PM  Pulse 67 09/20/2018  1:42 PM  Resp 8 09/20/2018  1:42 PM  SpO2 100 % 09/20/2018  1:42 PM  Vitals shown include unvalidated device data.  Last Pain:  Vitals:   09/20/18 0955  TempSrc: Oral  PainSc:       Patients Stated Pain Goal: 4 (47/07/61 5183)  Complications: No apparent anesthesia complications

## 2018-09-20 NOTE — Anesthesia Postprocedure Evaluation (Signed)
Anesthesia Post Note  Patient: April Brewer  Procedure(s) Performed: CLOSED REDUCTION HIP (Right )     Patient location during evaluation: PACU Anesthesia Type: General Level of consciousness: awake and alert Pain management: pain level controlled Vital Signs Assessment: post-procedure vital signs reviewed and stable Respiratory status: spontaneous breathing, nonlabored ventilation, respiratory function stable and patient connected to nasal cannula oxygen Cardiovascular status: blood pressure returned to baseline and stable Postop Assessment: no apparent nausea or vomiting Anesthetic complications: no    Last Vitals:  Vitals:   09/20/18 1415 09/20/18 1430  BP: (!) 115/59 (!) 104/59  Pulse: 70 71  Resp: 15 15  Temp:  36.8 C  SpO2: 92% 97%    Last Pain:  Vitals:   09/20/18 1430  TempSrc:   PainSc: 0-No pain                 Birttany Dechellis S

## 2018-09-20 NOTE — Progress Notes (Addendum)
Dr.Rose notified of pts hemoglobin 7.5, does not want type and screen for this procedure.

## 2018-09-20 NOTE — Sedation Documentation (Signed)
Hip reduction attempted by Dr. Leonette Monarch, hip continues to pop out after reduction. Pt to be transfer to [facility] for ortho surgeon evaluation.

## 2018-09-20 NOTE — ED Provider Notes (Signed)
Plan for OR reduction at 1030am @ [facility] by Dr Alvan Dame. [facility] to transport. Will obtain preop labs and ecg. IV fluids now   EKG Interpretation  Date/Time:  Tuesday September 20 2018 07:54:46 EDT Ventricular Rate:  73 PR Interval:    QRS Duration: 90 QT Interval:  410 QTC Calculation: 452 R Axis:   6 Text Interpretation:  Sinus rhythm Probable left atrial enlargement No significant change was found Confirmed by Jola Schmidt [phone]) on 09/20/2018 7:57:14 AM         Jola Schmidt, MD 09/20/18 [phone]

## 2018-09-21 ENCOUNTER — Encounter (HOSPITAL_COMMUNITY): Payer: Self-pay | Admitting: Orthopedic Surgery

## 2018-10-13 DIAGNOSIS — Z4732 Aftercare following explantation of hip joint prosthesis: Secondary | ICD-10-CM | POA: Diagnosis not present

## 2018-10-26 DIAGNOSIS — E7800 Pure hypercholesterolemia, unspecified: Secondary | ICD-10-CM | POA: Diagnosis not present

## 2018-10-26 DIAGNOSIS — M19011 Primary osteoarthritis, right shoulder: Secondary | ICD-10-CM | POA: Diagnosis not present

## 2018-10-26 DIAGNOSIS — D649 Anemia, unspecified: Secondary | ICD-10-CM | POA: Diagnosis not present

## 2018-10-26 DIAGNOSIS — E039 Hypothyroidism, unspecified: Secondary | ICD-10-CM | POA: Diagnosis not present

## 2018-10-26 DIAGNOSIS — M519 Unspecified thoracic, thoracolumbar and lumbosacral intervertebral disc disorder: Secondary | ICD-10-CM | POA: Diagnosis not present

## 2018-10-26 DIAGNOSIS — Z79899 Other long term (current) drug therapy: Secondary | ICD-10-CM | POA: Diagnosis not present

## 2018-11-23 DIAGNOSIS — T84020D Dislocation of internal right hip prosthesis, subsequent encounter: Secondary | ICD-10-CM | POA: Diagnosis not present

## 2018-11-23 DIAGNOSIS — M25551 Pain in right hip: Secondary | ICD-10-CM | POA: Diagnosis not present

## 2018-11-23 DIAGNOSIS — Z471 Aftercare following joint replacement surgery: Secondary | ICD-10-CM | POA: Diagnosis not present

## 2018-12-16 DIAGNOSIS — G894 Chronic pain syndrome: Secondary | ICD-10-CM | POA: Diagnosis not present

## 2019-01-03 DIAGNOSIS — H903 Sensorineural hearing loss, bilateral: Secondary | ICD-10-CM | POA: Diagnosis not present

## 2019-01-03 DIAGNOSIS — H6123 Impacted cerumen, bilateral: Secondary | ICD-10-CM | POA: Diagnosis not present

## 2019-01-04 DIAGNOSIS — M7061 Trochanteric bursitis, right hip: Secondary | ICD-10-CM | POA: Diagnosis not present

## 2019-01-04 DIAGNOSIS — T84020D Dislocation of internal right hip prosthesis, subsequent encounter: Secondary | ICD-10-CM | POA: Diagnosis not present

## 2019-01-16 DIAGNOSIS — H903 Sensorineural hearing loss, bilateral: Secondary | ICD-10-CM | POA: Diagnosis not present

## 2019-01-24 DIAGNOSIS — H903 Sensorineural hearing loss, bilateral: Secondary | ICD-10-CM | POA: Diagnosis not present

## 2019-01-25 DIAGNOSIS — M19011 Primary osteoarthritis, right shoulder: Secondary | ICD-10-CM | POA: Diagnosis not present

## 2019-01-25 DIAGNOSIS — Z79899 Other long term (current) drug therapy: Secondary | ICD-10-CM | POA: Diagnosis not present

## 2019-01-25 DIAGNOSIS — D539 Nutritional anemia, unspecified: Secondary | ICD-10-CM | POA: Diagnosis not present

## 2019-01-25 DIAGNOSIS — M519 Unspecified thoracic, thoracolumbar and lumbosacral intervertebral disc disorder: Secondary | ICD-10-CM | POA: Diagnosis not present

## 2019-01-25 DIAGNOSIS — E039 Hypothyroidism, unspecified: Secondary | ICD-10-CM | POA: Diagnosis not present

## 2019-02-02 DIAGNOSIS — M25512 Pain in left shoulder: Secondary | ICD-10-CM | POA: Diagnosis not present

## 2019-02-02 DIAGNOSIS — M19012 Primary osteoarthritis, left shoulder: Secondary | ICD-10-CM | POA: Diagnosis not present

## 2019-02-03 ENCOUNTER — Other Ambulatory Visit: Payer: Self-pay | Admitting: Pharmacist

## 2019-02-03 NOTE — Patient Outreach (Signed)
[facility]) Care Management  [place] - Medication Adherence   02/03/2019  April Brewer [birth year] [id number]  Target Medication: atorvastatin 10 mg Date & Supply of last refill: 11/28/2018, 90 day supply Current insurance:[employer]   Outreach:  Incoming call from April Brewer in response to the [facility] Medication Adherence Campaign. Speak with patient. HIPAA identifiers verified.  Subjective:   April Brewer reports that she takes her atorvastatin 10 mg, but not every day. Reports that she was not really sure why she was taking this medication and has told her PCP that she often only takes it a couple of day/week. Counsel patient about the importance of adherence to this medication. Patient verbalizes understanding and expresses appreciation for this information. States that she will restart taking it once daily as directed.   Counsel patient about the value of using a weekly pillbox to aid adherence.  April Brewer denies any further medication questions or concerns at this time.   Objective: Lab Results  Component Value Date   CREATININE 0.84 09/20/2018   CREATININE 0.99 08/31/2018   CREATININE 0.84 08/24/2018    Lipid Panel     Component Value Date/Time   CHOL 223 (H) 05/23/2015 1400   TRIG 102.0 05/23/2015 1400   HDL 55.30 05/23/2015 1400   CHOLHDL 4 05/23/2015 1400   VLDL 20.4 05/23/2015 1400   LDLCALC 147 (H) 05/23/2015 1400   LDLDIRECT 133.3 12/16/2011 1029    Allergies  Allergen Reactions  . Latex Rash     Assessment:  Barrier identified: . Patient currently not wanting to take as directed, as she denies understanding importance of taking this medication. Patient counseled on benefit of adherence.   Plan:  1) Patient to resume taking her atorvastatin as directed.  2) Will close pharmacy episode.   April Brewer, PharmD, [place]  Management [phone]

## 2019-02-04 ENCOUNTER — Other Ambulatory Visit: Payer: Self-pay | Admitting: Orthopedic Surgery

## 2019-02-04 DIAGNOSIS — M19012 Primary osteoarthritis, left shoulder: Secondary | ICD-10-CM

## 2019-02-10 ENCOUNTER — Ambulatory Visit
Admission: RE | Admit: 2019-02-10 | Discharge: 2019-02-10 | Disposition: A | Discharge status: Home / Self Care | Payer: PPO | Source: Ambulatory Visit | Attending: Orthopedic Surgery | Admitting: Orthopedic Surgery

## 2019-02-10 DIAGNOSIS — M19012 Primary osteoarthritis, left shoulder: Secondary | ICD-10-CM

## 2019-02-15 DIAGNOSIS — I10 Essential (primary) hypertension: Secondary | ICD-10-CM | POA: Diagnosis not present

## 2019-02-15 DIAGNOSIS — R739 Hyperglycemia, unspecified: Secondary | ICD-10-CM | POA: Diagnosis not present

## 2019-02-15 DIAGNOSIS — N390 Urinary tract infection, site not specified: Secondary | ICD-10-CM | POA: Diagnosis not present

## 2019-02-15 DIAGNOSIS — M25512 Pain in left shoulder: Secondary | ICD-10-CM | POA: Diagnosis not present

## 2019-02-15 DIAGNOSIS — Z79899 Other long term (current) drug therapy: Secondary | ICD-10-CM | POA: Diagnosis not present

## 2019-02-15 DIAGNOSIS — M19011 Primary osteoarthritis, right shoulder: Secondary | ICD-10-CM | POA: Diagnosis not present

## 2019-02-15 DIAGNOSIS — M519 Unspecified thoracic, thoracolumbar and lumbosacral intervertebral disc disorder: Secondary | ICD-10-CM | POA: Diagnosis not present

## 2019-04-14 ENCOUNTER — Inpatient Hospital Stay: Admit: 2019-04-14 | Payer: PPO | Admitting: Orthopedic Surgery

## 2019-04-14 SURGERY — ARTHROPLASTY, SHOULDER, TOTAL, REVERSE
Anesthesia: General | Laterality: Left

## 2019-05-10 DIAGNOSIS — M545 Low back pain: Secondary | ICD-10-CM | POA: Diagnosis not present

## 2019-05-10 DIAGNOSIS — G894 Chronic pain syndrome: Secondary | ICD-10-CM | POA: Diagnosis not present

## 2019-05-10 DIAGNOSIS — M542 Cervicalgia: Secondary | ICD-10-CM | POA: Diagnosis not present

## 2019-05-24 DIAGNOSIS — Z1159 Encounter for screening for other viral diseases: Secondary | ICD-10-CM | POA: Diagnosis not present

## 2019-05-24 DIAGNOSIS — M519 Unspecified thoracic, thoracolumbar and lumbosacral intervertebral disc disorder: Secondary | ICD-10-CM | POA: Diagnosis not present

## 2019-05-24 DIAGNOSIS — I10 Essential (primary) hypertension: Secondary | ICD-10-CM | POA: Diagnosis not present

## 2019-05-24 DIAGNOSIS — M19011 Primary osteoarthritis, right shoulder: Secondary | ICD-10-CM | POA: Diagnosis not present

## 2019-05-24 DIAGNOSIS — E7800 Pure hypercholesterolemia, unspecified: Secondary | ICD-10-CM | POA: Diagnosis not present

## 2019-05-24 DIAGNOSIS — Z79899 Other long term (current) drug therapy: Secondary | ICD-10-CM | POA: Diagnosis not present

## 2019-05-24 DIAGNOSIS — E039 Hypothyroidism, unspecified: Secondary | ICD-10-CM | POA: Diagnosis not present

## 2019-05-30 NOTE — Progress Notes (Signed)
09/20/2018- noted in Epic-EKG

## 2019-05-30 NOTE — Patient Instructions (Addendum)
April Brewer  05/30/2019   Your procedure is scheduled on: Friday 06/09/2019  Report to [facility] Main  Entrance              Report to admitting at  1000  AM               YOU NEED TO HAVE A COVID 19 TEST ON  06-06-19@ 235 pm, THIS TEST MUST BE DONE BEFORE SURGERY, COME TO [place].    Call this number if you have problems the morning of surgery [phone]    Remember: Do not eat food  :After Midnight.              NO SOLID FOOD AFTER MIDNIGHT THE NIGHT PRIOR TO SURGERY. NOTHING BY MOUTH EXCEPT CLEAR LIQUIDS UNTIL 0430 am.               PLEASE FINISH ENSURE DRINK PER SURGEON ORDER  WHICH NEEDS TO BE COMPLETED AT  0430 am.   CLEAR LIQUID DIET   Foods Allowed                                                                     Foods Excluded  Coffee and tea, regular and decaf                             liquids that you cannot  Plain Jell-O in any flavor                                             see through such as: Fruit ices (not with fruit pulp)                                     milk, soups, orange juice  Iced Popsicles                                    All solid food Carbonated beverages, regular and diet                                    Cranberry, grape and apple juices Sports drinks like Gatorade Lightly seasoned clear broth or consume(fat free) Sugar, honey syrup  Sample Menu Breakfast                                Lunch                                     Supper Cranberry juice  Beef broth                            Chicken broth Jell-O                                     Grape juice                           Apple juice Coffee or tea                        Jell-O                                      Popsicle                                                Coffee or tea                        Coffee or  tea  _____________________________________________________________________               BRUSH YOUR TEETH MORNING OF SURGERY AND RINSE YOUR MOUTH OUT, NO CHEWING GUM CANDY OR MINTS.     Take these medicines the morning of surgery with A SIP OF WATER: Atorvastatin (Lipitor), Levothyroxine (Synthroid), Sertraline (Zoloft)                                You may not have any metal on your body including hair pins and              piercings  Do not wear jewelry, make-up, lotions, powders or perfumes, deodorant             Do not wear nail polish.  Do not shave  48 hours prior to surgery.             Do not bring valuables to the hospital. [place].  Contacts, dentures or bridgework may not be worn into surgery.  Leave suitcase in the car. After surgery it may be brought to your room.                   Please read over the following fact sheets you were given: _____________________________________________________________________             [facility] - Preparing for Surgery Before surgery, you can play an important role.  Because skin is not sterile, your skin needs to be as free of germs as possible.  You can reduce the number of germs on your skin by washing with CHG (chlorahexidine gluconate) soap before surgery.  CHG is an antiseptic cleaner which kills germs and bonds with the skin to continue killing germs even after washing. Please DO NOT use if you have an allergy to CHG or antibacterial soaps.  If your skin becomes reddened/irritated stop using the CHG and inform your nurse when you  arrive at Short Stay. Do not shave (including legs and underarms) for at least 48 hours prior to the first CHG shower.  You may shave your face/neck. Please follow these instructions carefully:  1.  Shower with CHG Soap the night before surgery and the  morning of Surgery.  2.  If you choose to wash your hair, wash your hair first as usual with  your  normal  shampoo.  3.  After you shampoo, rinse your hair and body thoroughly to remove the  shampoo.                           4.  Use CHG as you would any other liquid soap.  You can apply chg directly  to the skin and wash                       Gently with a scrungie or clean washcloth.  5.  Apply the CHG Soap to your body ONLY FROM THE NECK DOWN.   Do not use on face/ open                           Wound or open sores. Avoid contact with eyes, ears mouth and genitals (private parts).                       Wash face,  Genitals (private parts) with your normal soap.             6.  Wash thoroughly, paying special attention to the area where your surgery  will be performed.  7.  Thoroughly rinse your body with warm water from the neck down.  8.  DO NOT shower/wash with your normal soap after using and rinsing off  the CHG Soap.                9.  Pat yourself dry with a clean towel.            10.  Wear clean pajamas.            11.  Place clean sheets on your bed the night of your first shower and do not  sleep with pets. Day of Surgery : Do not apply any lotions/deodorants the morning of surgery.  Please wear clean clothes to the hospital/surgery center.  FAILURE TO FOLLOW THESE INSTRUCTIONS MAY RESULT IN THE CANCELLATION OF YOUR SURGERY PATIENT SIGNATURE_________________________________  NURSE SIGNATURE__________________________________  ________________________________________________________________________   Adam Phenix  An incentive spirometer is a tool that can help keep your lungs clear and active. This tool measures how well you are filling your lungs with each breath. Taking long deep breaths may help reverse or decrease the chance of developing breathing (pulmonary) problems (especially infection) following:  A long period of time when you are unable to move or be active. BEFORE THE PROCEDURE   If the spirometer includes an indicator to show your best effort,  your nurse or respiratory therapist will set it to a desired goal.  If possible, sit up straight or lean slightly forward. Try not to slouch.  Hold the incentive spirometer in an upright position. INSTRUCTIONS FOR USE  1. Sit on the edge of your bed if possible, or sit up as far as you can in bed or on a chair. 2. Hold the incentive spirometer in an upright position. 3. Breathe out normally.  4. Place the mouthpiece in your mouth and seal your lips tightly around it. 5. Breathe in slowly and as deeply as possible, raising the piston or the ball toward the top of the column. 6. Hold your breath for 3-5 seconds or for as long as possible. Allow the piston or ball to fall to the bottom of the column. 7. Remove the mouthpiece from your mouth and breathe out normally. 8. Rest for a few seconds and repeat Steps 1 through 7 at least 10 times every 1-2 hours when you are awake. Take your time and take a few normal breaths between deep breaths. 9. The spirometer may include an indicator to show your best effort. Use the indicator as a goal to work toward during each repetition. 10. After each set of 10 deep breaths, practice coughing to be sure your lungs are clear. If you have an incision (the cut made at the time of surgery), support your incision when coughing by placing a pillow or rolled up towels firmly against it. Once you are able to get out of bed, walk around indoors and cough well. You may stop using the incentive spirometer when instructed by your caregiver.  RISKS AND COMPLICATIONS  Take your time so you do not get dizzy or light-headed.  If you are in pain, you may need to take or ask for pain medication before doing incentive spirometry. It is harder to take a deep breath if you are having pain. AFTER USE  Rest and breathe slowly and easily.  It can be helpful to keep track of a log of your progress. Your caregiver can provide you with a simple table to help with this. If you are  using the spirometer at home, follow these instructions: [place] IF:   You are having difficultly using the spirometer.  You have trouble using the spirometer as often as instructed.  Your pain medication is not giving enough relief while using the spirometer.  You develop fever of 100.5 F (38.1 C) or higher. SEEK IMMEDIATE MEDICAL CARE IF:   You cough up bloody sputum that had not been present before.  You develop fever of 102 F (38.9 C) or greater.  You develop worsening pain at or near the incision site. MAKE SURE YOU:   Understand these instructions.  Will watch your condition.  Will get help right away if you are not doing well or get worse. Document Released: 04/26/2007 Document Revised: 03/07/2012 Document Reviewed: 06/27/2007 [facility] Patient Information 2014 [place], [place].   ________________________________________________________________________

## 2019-05-30 NOTE — H&P (Signed)
Patient's anticipated LOS is less than 2 midnights, meeting these requirements: - Younger than 84 - Lives within 1 hour of care - Has a competent adult at home to recover with post-op recover - NO history of  - Chronic pain requiring opiods  - Diabetes  - Coronary Artery Disease  - Heart failure  - Heart attack  - Stroke  - DVT/VTE  - Cardiac arrhythmia  - Respiratory Failure/COPD  - Renal failure  - Anemia  - Advanced Liver disease       April Brewer is an 73 y.o. female.    Chief Complaint: left shoulder pain  HPI: Pt is a 73 y.o. female complaining of left shoulder pain for multiple years. Pain had continually increased since the beginning. X-rays in the clinic show end-stage arthritic changes of the left shoulder. Pt has tried various conservative treatments which have failed to alleviate their symptoms, including injections and therapy. Various options are discussed with the patient. Risks, benefits and expectations were discussed with the patient. Patient understand the risks, benefits and expectations and wishes to proceed with surgery.   PCP:  Emelia Loron  D/C Plans: Home  PMH: Past Medical History:  Diagnosis Date  . ALLERGIC RHINITIS 06/02/2007  . Allergy   . Arthritis    DDD of cervical, thoracic, and lumbar spine  . Blood transfusion without reported diagnosis   . Cataract    Bilateral removed cateracts  . Cervical spondylosis    C5-6 and C6-7.  Pain mgmt as per Dr. Nelva Bush.  . Chronic low back pain 02/18/2009   s/p fusion L3-4 through L5-S1.  Pain meds per Dr. Nelva Bush  . DEPRESSION 06/02/2007  . Diverticulosis   . GERD (gastroesophageal reflux disease)   . Heart murmur   . Hiatal hernia   . HYPOTHYROIDISM 06/02/2007   GOITER  . INSOMNIA 06/02/2007  . Lymphocytic colitis   . Muscle spasms of lower extremity   . Nasal septal perforation    chronic; hx of epistaxis  . Normocytic anemia 04/2015   HEME + in ED 08/07/15--endoscopic eval unrevealing.   Iron studies fine.  ?Anemia of chronic dz (lymphocytic colitis?)  . OSTEOPENIA 02/18/2009   Repeat DEXA 07/2014 showed osteoporosis by T score in radius but spine and hip T scores were normal-continue vit D and calcium and repeat DEXA 08/2016 unchanged.  Repeat DEXA 2 yrs.  . Peripheral edema    LE's, nonpitting    PSH: Past Surgical History:  Procedure Laterality Date  . ABDOMINAL HYSTERECTOMY     for DUB (ovaries are still in)  . BACK SURGERY     X 5: fusion of L3-L4 through L5-S1  . CATARACT EXTRACTION Bilateral   . CHOLECYSTECTOMY  2012  . COLONOSCOPY  06/2007; 01/2015   2008 Diverticulosis, o/w normal.  2016 showed lymphocytic colitis with mild diverticulosis: pt was started on oral budesonide at that time.  Marland Kitchen DEXA  08/16/2014; 09/14/16   Hip and spine ok; radius osteoporotic---but meds not indicated.  Repeat 08/2018  . ESOPHAGOGASTRODUODENOSCOPY  10/2007; 11/27/15   Small hiatal hernia, o/w normal.  . HIP CLOSED REDUCTION Right 09/20/2018   Procedure: CLOSED REDUCTION HIP;  Surgeon: Paralee Cancel, MD;  Location: [facility] ORS;  Service: Orthopedics;  Laterality: Right;  . SHOULDER SURGERY Right    \  . TOTAL HIP ARTHROPLASTY  05/31/2012   Procedure: TOTAL HIP ARTHROPLASTY ANTERIOR APPROACH;  Surgeon: Mauri Pole, MD;  Location: [facility] ORS;  Service: Orthopedics;  Laterality: Left;  .  TOTAL HIP ARTHROPLASTY Right 08/30/2018   Procedure: RIGHT TOTAL HIP ARTHROPLASTY ANTERIOR APPROACH;  Surgeon: Paralee Cancel, MD;  Location: [facility] ORS;  Service: Orthopedics;  Laterality: Right;  70 mins    Social History:  reports that she has never smoked. She has never used smokeless tobacco. She reports that she does not drink alcohol or use drugs.  Allergies:  Allergies  Allergen Reactions  . Latex Rash    Medications: No current facility-administered medications for this encounter.    Current Outpatient Medications  Medication Sig Dispense Refill  . acetaminophen (TYLENOL) 500 MG tablet Take 2 tablets  (1,000 mg total) by mouth every 8 (eight) hours. 30 tablet 0  . alprazolam (XANAX) 2 MG tablet Take one-half to one tablet by mouth twice daily as needed (Patient taking differently: Take 1-2 mg by mouth See admin instructions. Take 1 mg by mouth in the morning and 2 mg by mouth in the evening) 180 tablet 1  . atorvastatin (LIPITOR) 10 MG tablet Take 10 mg by mouth daily.   1  . docusate sodium (COLACE) 100 MG capsule Take 1 capsule (100 mg total) by mouth 2 (two) times daily. 10 capsule 0  . ferrous sulfate (FERROUSUL) 325 (65 FE) MG tablet Take 1 tablet (325 mg total) by mouth 3 (three) times daily with meals.  3  . furosemide (LASIX) 20 MG tablet Take 1 tablet (20 mg total) by mouth daily as needed for fluid or edema. 30 tablet 0  . Iron-FA-B Cmp-C-Biot-Probiotic (FUSION PLUS) CAPS Take 1 capsule by mouth daily.  3  . levothyroxine (SYNTHROID, LEVOTHROID) 75 MCG tablet TAKE 1 TABLET BY MOUTH ONCE DAILY WITH BREAKFAST (Patient taking differently: Take 75 mcg by mouth daily before breakfast. ) 90 tablet 3  . methocarbamol (ROBAXIN) 500 MG tablet Take 1 tablet (500 mg total) by mouth every 6 (six) hours as needed for muscle spasms. 40 tablet 0  . omeprazole (PRILOSEC) 40 MG capsule TAKE 1 CAPSULE BY MOUTH  DAILY (Patient taking differently: Take 40 mg by mouth daily as needed (heartburn). ) 90 capsule 3  . oxyCODONE (OXY IR/ROXICODONE) 5 MG immediate release tablet Take 1-2 tablets (5-10 mg total) by mouth every 4 (four) hours as needed for moderate pain or severe pain. 60 tablet 0  . polyethylene glycol (MIRALAX / GLYCOLAX) packet Take 17 g by mouth 2 (two) times daily. 14 each 0  . sertraline (ZOLOFT) 100 MG tablet TAKE ONE AND ONE-HALF  TABLET BY MOUTH NIGHTLY AT  BEDTIME (Patient taking differently: Take 50-100 mg by mouth 2 (two) times daily. ) 135 tablet 3  . traZODone (DESYREL) 50 MG tablet TAKE 4 TABLETS BY MOUTH  EVERY NIGHT AT BEDTIME AS  NEEDED FOR INSOMNIA (Patient taking differently: Take  200 mg by mouth at bedtime. ) 360 tablet 3  . triamterene-hydrochlorothiazide (MAXZIDE-25) 37.5-25 MG tablet Take 1 tablet by mouth daily as needed (swelling).   5    No results found for this or any previous visit (from the past 48 hour(s)). No results found.  ROS: Pain with rom of the left upper extremity  Physical Exam: Alert and oriented 73 y.o. female in no acute distress Cranial nerves 2-12 intact Cervical spine: full rom with no tenderness, nv intact distally Chest: active breath sounds bilaterally, no wheeze rhonchi or rales Heart: regular rate and rhythm, no murmur Abd: non tender non distended with active bowel sounds Hip is stable with rom  Left shoulder with painful rom Moderate weakness with  ER and IR No rashes or edema  Assessment/Plan Assessment: left shoulder cuff arthropathy  Plan:  Patient will undergo a left reverse total shoulder by Dr. Veverly Fells at [facility]. Risks benefits and expectations were discussed with the patient. Patient understand risks, benefits and expectations and wishes to proceed. Preoperative templating of the joint replacement has been completed, documented, and submitted to the Operating Room personnel in order to optimize intra-operative equipment management.   Merla Riches PA-C, MPAS [facility] is now [employer] [address] [place], [place], [place] [address] Phone: [phone] [email or web address] Facebook  [employer]

## 2019-05-31 ENCOUNTER — Other Ambulatory Visit: Payer: Self-pay

## 2019-05-31 ENCOUNTER — Encounter (HOSPITAL_COMMUNITY)
Admission: RE | Admit: 2019-05-31 | Discharge: 2019-05-31 | Disposition: A | Discharge status: Home / Self Care | Payer: PPO | Source: Ambulatory Visit | Attending: Orthopedic Surgery | Admitting: Orthopedic Surgery

## 2019-05-31 ENCOUNTER — Encounter (HOSPITAL_COMMUNITY): Payer: Self-pay

## 2019-05-31 DIAGNOSIS — Z01812 Encounter for preprocedural laboratory examination: Secondary | ICD-10-CM | POA: Insufficient documentation

## 2019-05-31 LAB — CBC
HCT: 32.8 % — ABNORMAL LOW (ref 36.0–46.0)
Hemoglobin: 10.8 g/dL — ABNORMAL LOW (ref 12.0–15.0)
MCH: 30.1 pg (ref 26.0–34.0)
MCHC: 32.9 g/dL (ref 30.0–36.0)
MCV: 91.4 fL (ref 80.0–100.0)
Platelets: 284 10*3/uL (ref 150–400)
RBC: 3.59 MIL/uL — ABNORMAL LOW (ref 3.87–5.11)
RDW: 14.3 % (ref 11.5–15.5)
WBC: 8 10*3/uL (ref 4.0–10.5)
nRBC: 0 % (ref 0.0–0.2)

## 2019-05-31 LAB — BASIC METABOLIC PANEL
Anion gap: 9 (ref 5–15)
BUN: 20 mg/dL (ref 8–23)
CO2: 25 mmol/L (ref 22–32)
Calcium: 9.1 mg/dL (ref 8.9–10.3)
Chloride: 93 mmol/L — ABNORMAL LOW (ref 98–111)
Creatinine, Ser: 0.83 mg/dL (ref 0.44–1.00)
GFR calc Af Amer: >60 mL/min (ref >60)
GFR calc non Af Amer: >60 mL/min (ref >60)
Glucose, Bld: 75 mg/dL (ref 70–99)
Potassium: 4.9 mmol/L (ref 3.5–5.1)
Sodium: 127 mmol/L — ABNORMAL LOW (ref 135–145)

## 2019-05-31 LAB — SURGICAL PCR SCREEN
MRSA, PCR: NEGATIVE
Staphylococcus aureus: POSITIVE — AB

## 2019-06-06 ENCOUNTER — Other Ambulatory Visit (HOSPITAL_COMMUNITY)
Admission: RE | Admit: 2019-06-06 | Discharge: 2019-06-06 | Disposition: A | Discharge status: Home / Self Care | Payer: PPO | Source: Ambulatory Visit | Attending: Orthopedic Surgery | Admitting: Orthopedic Surgery

## 2019-06-06 DIAGNOSIS — M12812 Other specific arthropathies, not elsewhere classified, left shoulder: Secondary | ICD-10-CM | POA: Diagnosis not present

## 2019-06-06 DIAGNOSIS — Z1159 Encounter for screening for other viral diseases: Secondary | ICD-10-CM | POA: Diagnosis not present

## 2019-06-07 LAB — NOVEL CORONAVIRUS, NAA (HOSP ORDER, SEND-OUT TO REF LAB; TAT 18-24 HRS): SARS-CoV-2, NAA: NOT DETECTED

## 2019-06-08 NOTE — Progress Notes (Signed)
SPOKE W/  Pt. April Brewer via phone: reviewed the following questions....   SCREENING SYMPTOMS OF COVID 19:  COUGH--no  RUNNY NOSE--- no  SORE THROAT---no  NASAL CONGESTION----no  SNEEZING----no  SHORTNESS OF BREATH---no DIFFICULTY BREATHING---no  TEMP >100.0 -----no  UNEXPLAINED BODY ACHES------no  CHILLS -------- no  HEADACHES ---------no  LOSS OF SMELL/ TASTE --------no  HAVE YOU OR ANY FAMILY MEMBER TRAVELLED PAST 14 DAYS OUT OF THE   COUNTY---no STATE----no COUNTRY----no  HAVE YOU OR ANY FAMILY MEMBER BEEN EXPOSED TO ANYONE WITH COVID 19?  no

## 2019-06-08 NOTE — Progress Notes (Signed)
SPOKE WITH  PATIENT BY PHONE AWARE SURGERY TIME CHANGE TO 1008 ARRIVE 730 AM 06-09-19 [facility] ADMITTING

## 2019-06-09 ENCOUNTER — Encounter (HOSPITAL_COMMUNITY)
Admission: RE | Disposition: A | Discharge status: Home / Self Care | Payer: Self-pay | Source: Other Acute Inpatient Hospital | Attending: Orthopedic Surgery

## 2019-06-09 ENCOUNTER — Inpatient Hospital Stay (HOSPITAL_COMMUNITY)
Admission: RE | Admit: 2019-06-09 | Discharge: 2019-06-10 | DRG: 483 | Disposition: A | Discharge status: Home / Self Care | Payer: PPO | Source: Other Acute Inpatient Hospital | Attending: Orthopedic Surgery | Admitting: Orthopedic Surgery

## 2019-06-09 ENCOUNTER — Other Ambulatory Visit: Payer: Self-pay

## 2019-06-09 ENCOUNTER — Encounter (HOSPITAL_COMMUNITY): Payer: Self-pay | Admitting: *Deleted

## 2019-06-09 ENCOUNTER — Inpatient Hospital Stay (HOSPITAL_COMMUNITY): Payer: PPO

## 2019-06-09 ENCOUNTER — Inpatient Hospital Stay (HOSPITAL_COMMUNITY): Payer: PPO | Admitting: Anesthesiology

## 2019-06-09 DIAGNOSIS — Z9049 Acquired absence of other specified parts of digestive tract: Secondary | ICD-10-CM | POA: Diagnosis not present

## 2019-06-09 DIAGNOSIS — I10 Essential (primary) hypertension: Secondary | ICD-10-CM | POA: Diagnosis not present

## 2019-06-09 DIAGNOSIS — Z79890 Hormone replacement therapy (postmenopausal): Secondary | ICD-10-CM

## 2019-06-09 DIAGNOSIS — G8929 Other chronic pain: Secondary | ICD-10-CM | POA: Diagnosis not present

## 2019-06-09 DIAGNOSIS — E039 Hypothyroidism, unspecified: Secondary | ICD-10-CM | POA: Diagnosis present

## 2019-06-09 DIAGNOSIS — Z1159 Encounter for screening for other viral diseases: Secondary | ICD-10-CM | POA: Diagnosis not present

## 2019-06-09 DIAGNOSIS — M810 Age-related osteoporosis without current pathological fracture: Secondary | ICD-10-CM | POA: Diagnosis not present

## 2019-06-09 DIAGNOSIS — M19012 Primary osteoarthritis, left shoulder: Principal | ICD-10-CM | POA: Diagnosis present

## 2019-06-09 DIAGNOSIS — Z79891 Long term (current) use of opiate analgesic: Secondary | ICD-10-CM | POA: Diagnosis not present

## 2019-06-09 DIAGNOSIS — Z90710 Acquired absence of both cervix and uterus: Secondary | ICD-10-CM | POA: Diagnosis not present

## 2019-06-09 DIAGNOSIS — Z79899 Other long term (current) drug therapy: Secondary | ICD-10-CM

## 2019-06-09 DIAGNOSIS — F418 Other specified anxiety disorders: Secondary | ICD-10-CM | POA: Diagnosis not present

## 2019-06-09 DIAGNOSIS — G8918 Other acute postprocedural pain: Secondary | ICD-10-CM | POA: Diagnosis not present

## 2019-06-09 DIAGNOSIS — Z96612 Presence of left artificial shoulder joint: Secondary | ICD-10-CM | POA: Diagnosis not present

## 2019-06-09 DIAGNOSIS — M25712 Osteophyte, left shoulder: Secondary | ICD-10-CM | POA: Diagnosis not present

## 2019-06-09 DIAGNOSIS — M545 Low back pain: Secondary | ICD-10-CM | POA: Diagnosis present

## 2019-06-09 DIAGNOSIS — K219 Gastro-esophageal reflux disease without esophagitis: Secondary | ICD-10-CM | POA: Diagnosis not present

## 2019-06-09 DIAGNOSIS — M75122 Complete rotator cuff tear or rupture of left shoulder, not specified as traumatic: Secondary | ICD-10-CM | POA: Diagnosis not present

## 2019-06-09 DIAGNOSIS — Z471 Aftercare following joint replacement surgery: Secondary | ICD-10-CM | POA: Diagnosis not present

## 2019-06-09 HISTORY — PX: REVERSE SHOULDER ARTHROPLASTY: SHX5054

## 2019-06-09 SURGERY — ARTHROPLASTY, SHOULDER, TOTAL, REVERSE
Anesthesia: General | Site: Shoulder | Laterality: Left

## 2019-06-09 MED ORDER — ACETAMINOPHEN 325 MG PO TABS: 325.0000 mg | ORAL_TABLET | ORAL | Status: DC | PRN

## 2019-06-09 MED ORDER — BUPIVACAINE-EPINEPHRINE (PF) 0.5% -1:200000 IJ SOLN
INTRAMUSCULAR | Status: DC | PRN
  Administered 2019-06-09: 20 mL via PERINEURAL

## 2019-06-09 MED ORDER — METOCLOPRAMIDE HCL 5 MG/ML IJ SOLN: 5.0000 mg | Freq: Three times a day (TID) | INTRAMUSCULAR | Status: DC | PRN

## 2019-06-09 MED ORDER — OXYCODONE HCL 5 MG PO TABS: 5.0000 mg | ORAL_TABLET | ORAL | 0 refills | Status: DC | PRN

## 2019-06-09 MED ORDER — TAB-A-VITE/IRON PO TABS
1.0000 | ORAL_TABLET | Freq: Every day | ORAL | Status: DC
  Administered 2019-06-09 – 2019-06-10 (×2): 1 via ORAL
  Filled 2019-06-09 (×2): qty 1

## 2019-06-09 MED ORDER — TRIAMTERENE-HCTZ 37.5-25 MG PO TABS: 1.0000 | ORAL_TABLET | Freq: Every day | ORAL | Status: DC | PRN

## 2019-06-09 MED ORDER — SERTRALINE HCL 100 MG PO TABS
100.0000 mg | ORAL_TABLET | Freq: Two times a day (BID) | ORAL | Status: DC
  Administered 2019-06-09 – 2019-06-10 (×2): 100 mg via ORAL
  Filled 2019-06-09 (×2): qty 1

## 2019-06-09 MED ORDER — ACETAMINOPHEN 500 MG PO TABS
1000.0000 mg | ORAL_TABLET | Freq: Three times a day (TID) | ORAL | Status: DC
  Administered 2019-06-09 – 2019-06-10 (×3): 1000 mg via ORAL
  Filled 2019-06-09 (×3): qty 2

## 2019-06-09 MED ORDER — OXYCODONE HCL 5 MG PO TABS: 5.0000 mg | ORAL_TABLET | Freq: Once | ORAL | Status: DC | PRN

## 2019-06-09 MED ORDER — SODIUM CHLORIDE 0.9 % IV SOLN
INTRAVENOUS | Status: DC | PRN
  Administered 2019-06-09: 30 ug/min via INTRAVENOUS

## 2019-06-09 MED ORDER — BUPIVACAINE LIPOSOME 1.3 % IJ SUSP
INTRAMUSCULAR | Status: DC | PRN
  Administered 2019-06-09: 10 mL via PERINEURAL

## 2019-06-09 MED ORDER — FENTANYL CITRATE (PF) 100 MCG/2ML IJ SOLN
50.0000 ug | INTRAMUSCULAR | Status: DC
  Administered 2019-06-09: 100 ug via INTRAVENOUS

## 2019-06-09 MED ORDER — ROCURONIUM BROMIDE 10 MG/ML (PF) SYRINGE
PREFILLED_SYRINGE | INTRAVENOUS | Status: DC | PRN
  Administered 2019-06-09: 10 mg via INTRAVENOUS
  Administered 2019-06-09: 30 mg via INTRAVENOUS

## 2019-06-09 MED ORDER — 0.9 % SODIUM CHLORIDE (POUR BTL) OPTIME
TOPICAL | Status: DC | PRN
  Administered 2019-06-09: 1000 mL

## 2019-06-09 MED ORDER — LEVOTHYROXINE SODIUM 75 MCG PO TABS
75.0000 ug | ORAL_TABLET | Freq: Every day | ORAL | Status: DC
  Administered 2019-06-10: 06:00:00 75 ug via ORAL
  Filled 2019-06-09: qty 1

## 2019-06-09 MED ORDER — LACTATED RINGERS IV SOLN
INTRAVENOUS | Status: DC
  Administered 2019-06-09: 08:00:00 via INTRAVENOUS

## 2019-06-09 MED ORDER — PHENOL 1.4 % MT LIQD: 1.0000 | OROMUCOSAL | Status: DC | PRN

## 2019-06-09 MED ORDER — DEXAMETHASONE SODIUM PHOSPHATE 10 MG/ML IJ SOLN
INTRAMUSCULAR | Status: DC | PRN
  Administered 2019-06-09: 5 mg via INTRAVENOUS

## 2019-06-09 MED ORDER — FENTANYL CITRATE (PF) 100 MCG/2ML IJ SOLN
INTRAMUSCULAR | Status: DC | PRN
  Administered 2019-06-09 (×3): 50 ug via INTRAVENOUS

## 2019-06-09 MED ORDER — ROCURONIUM BROMIDE 10 MG/ML (PF) SYRINGE
PREFILLED_SYRINGE | INTRAVENOUS | Status: AC
  Filled 2019-06-09: qty 40

## 2019-06-09 MED ORDER — METHOCARBAMOL 500 MG PO TABS: 750.0000 mg | ORAL_TABLET | Freq: Three times a day (TID) | ORAL | Status: DC | PRN

## 2019-06-09 MED ORDER — METOCLOPRAMIDE HCL 5 MG PO TABS: 5.0000 mg | ORAL_TABLET | Freq: Three times a day (TID) | ORAL | Status: DC | PRN

## 2019-06-09 MED ORDER — SUCCINYLCHOLINE CHLORIDE 200 MG/10ML IV SOSY
PREFILLED_SYRINGE | INTRAVENOUS | Status: AC
  Filled 2019-06-09: qty 30

## 2019-06-09 MED ORDER — MEPERIDINE HCL 50 MG/ML IJ SOLN: 6.2500 mg | INTRAMUSCULAR | Status: DC | PRN

## 2019-06-09 MED ORDER — FENTANYL CITRATE (PF) 250 MCG/5ML IJ SOLN
INTRAMUSCULAR | Status: AC
  Filled 2019-06-09: qty 5

## 2019-06-09 MED ORDER — PROPOFOL 10 MG/ML IV BOLUS
INTRAVENOUS | Status: DC | PRN
  Administered 2019-06-09: 70 mg via INTRAVENOUS

## 2019-06-09 MED ORDER — FENTANYL CITRATE (PF) 100 MCG/2ML IJ SOLN
INTRAMUSCULAR | Status: AC
  Administered 2019-06-09: 100 ug via INTRAVENOUS
  Filled 2019-06-09: qty 2

## 2019-06-09 MED ORDER — ACETAMINOPHEN 160 MG/5ML PO SOLN: 325.0000 mg | ORAL | Status: DC | PRN

## 2019-06-09 MED ORDER — POLYETHYLENE GLYCOL 3350 17 G PO PACK: 17.0000 g | PACK | Freq: Two times a day (BID) | ORAL | Status: DC

## 2019-06-09 MED ORDER — HYDROMORPHONE HCL 1 MG/ML IJ SOLN: 0.5000 mg | INTRAMUSCULAR | Status: DC | PRN

## 2019-06-09 MED ORDER — PANTOPRAZOLE SODIUM 40 MG PO TBEC: 40.0000 mg | DELAYED_RELEASE_TABLET | Freq: Every day | ORAL | Status: DC

## 2019-06-09 MED ORDER — MENTHOL 3 MG MT LOZG: 1.0000 | LOZENGE | OROMUCOSAL | Status: DC | PRN

## 2019-06-09 MED ORDER — METHOCARBAMOL 500 MG PO TABS
500.0000 mg | ORAL_TABLET | Freq: Four times a day (QID) | ORAL | Status: DC | PRN
  Administered 2019-06-09: 500 mg via ORAL
  Filled 2019-06-09: qty 1

## 2019-06-09 MED ORDER — ACETAMINOPHEN 325 MG PO TABS: 325.0000 mg | ORAL_TABLET | Freq: Four times a day (QID) | ORAL | Status: DC | PRN

## 2019-06-09 MED ORDER — BUPIVACAINE-EPINEPHRINE (PF) 0.25% -1:200000 IJ SOLN
INTRAMUSCULAR | Status: DC | PRN
  Administered 2019-06-09: 10 mL via PERINEURAL

## 2019-06-09 MED ORDER — SUGAMMADEX SODIUM 200 MG/2ML IV SOLN
INTRAVENOUS | Status: DC | PRN
  Administered 2019-06-09: 150 mg via INTRAVENOUS

## 2019-06-09 MED ORDER — ALPRAZOLAM 1 MG PO TABS: 0.5000 mg | ORAL_TABLET | Freq: Two times a day (BID) | ORAL | Status: DC | PRN

## 2019-06-09 MED ORDER — OXYCODONE HCL 5 MG PO TABS
5.0000 mg | ORAL_TABLET | ORAL | Status: DC | PRN
  Administered 2019-06-09 – 2019-06-10 (×2): 5 mg via ORAL
  Filled 2019-06-09 (×2): qty 1

## 2019-06-09 MED ORDER — CHLORHEXIDINE GLUCONATE 4 % EX LIQD: 60.0000 mL | Freq: Once | CUTANEOUS | Status: DC

## 2019-06-09 MED ORDER — SODIUM CHLORIDE 0.9 % IV SOLN
INTRAVENOUS | Status: DC
  Administered 2019-06-09: 14:00:00 via INTRAVENOUS

## 2019-06-09 MED ORDER — TRAZODONE HCL 50 MG PO TABS
50.0000 mg | ORAL_TABLET | Freq: Every day | ORAL | Status: DC
  Administered 2019-06-09: 50 mg via ORAL
  Filled 2019-06-09: qty 1

## 2019-06-09 MED ORDER — FENTANYL CITRATE (PF) 100 MCG/2ML IJ SOLN: 25.0000 ug | INTRAMUSCULAR | Status: DC | PRN

## 2019-06-09 MED ORDER — ONDANSETRON HCL 4 MG/2ML IJ SOLN: 4.0000 mg | Freq: Once | INTRAMUSCULAR | Status: DC | PRN

## 2019-06-09 MED ORDER — FUROSEMIDE 20 MG PO TABS: 20.0000 mg | ORAL_TABLET | Freq: Every day | ORAL | Status: DC | PRN

## 2019-06-09 MED ORDER — DOCUSATE SODIUM 100 MG PO CAPS: 100.0000 mg | ORAL_CAPSULE | Freq: Two times a day (BID) | ORAL | Status: DC

## 2019-06-09 MED ORDER — ONDANSETRON HCL 4 MG/2ML IJ SOLN
INTRAMUSCULAR | Status: DC | PRN
  Administered 2019-06-09: 4 mg via INTRAVENOUS

## 2019-06-09 MED ORDER — STERILE WATER FOR IRRIGATION IR SOLN
Status: DC | PRN
  Administered 2019-06-09 (×2): 1000 mL

## 2019-06-09 MED ORDER — BUPIVACAINE-EPINEPHRINE (PF) 0.25% -1:200000 IJ SOLN
INTRAMUSCULAR | Status: AC
  Filled 2019-06-09: qty 30

## 2019-06-09 MED ORDER — PHENYLEPHRINE 40 MCG/ML (10ML) SYRINGE FOR IV PUSH (FOR BLOOD PRESSURE SUPPORT)
PREFILLED_SYRINGE | INTRAVENOUS | Status: DC | PRN
  Administered 2019-06-09: 120 ug via INTRAVENOUS

## 2019-06-09 MED ORDER — DOCUSATE SODIUM 100 MG PO CAPS
100.0000 mg | ORAL_CAPSULE | Freq: Two times a day (BID) | ORAL | Status: DC
  Administered 2019-06-09 – 2019-06-10 (×2): 100 mg via ORAL
  Filled 2019-06-09 (×2): qty 1

## 2019-06-09 MED ORDER — ONDANSETRON HCL 4 MG/2ML IJ SOLN: 4.0000 mg | Freq: Four times a day (QID) | INTRAMUSCULAR | Status: DC | PRN

## 2019-06-09 MED ORDER — ATORVASTATIN CALCIUM 10 MG PO TABS
10.0000 mg | ORAL_TABLET | Freq: Every day | ORAL | Status: DC
  Administered 2019-06-10: 10 mg via ORAL
  Filled 2019-06-09: qty 1

## 2019-06-09 MED ORDER — POLYETHYLENE GLYCOL 3350 17 G PO PACK: 17.0000 g | PACK | Freq: Every day | ORAL | Status: DC | PRN

## 2019-06-09 MED ORDER — ONDANSETRON HCL 4 MG PO TABS: 4.0000 mg | ORAL_TABLET | Freq: Four times a day (QID) | ORAL | Status: DC | PRN

## 2019-06-09 MED ORDER — PROPOFOL 10 MG/ML IV BOLUS
INTRAVENOUS | Status: AC
  Filled 2019-06-09: qty 20

## 2019-06-09 MED ORDER — LIDOCAINE 2% (20 MG/ML) 5 ML SYRINGE
INTRAMUSCULAR | Status: DC | PRN
  Administered 2019-06-09: 60 mg via INTRAVENOUS

## 2019-06-09 MED ORDER — CEFAZOLIN SODIUM-DEXTROSE 2-4 GM/100ML-% IV SOLN
2.0000 g | INTRAVENOUS | Status: AC
  Administered 2019-06-09: 2 g via INTRAVENOUS
  Filled 2019-06-09: qty 100

## 2019-06-09 MED ORDER — SUCCINYLCHOLINE CHLORIDE 20 MG/ML IJ SOLN
INTRAMUSCULAR | Status: DC | PRN
  Administered 2019-06-09: 80 mg via INTRAVENOUS

## 2019-06-09 MED ORDER — OXYCODONE HCL 5 MG/5ML PO SOLN: 5.0000 mg | Freq: Once | ORAL | Status: DC | PRN

## 2019-06-09 MED ORDER — CEFAZOLIN SODIUM-DEXTROSE 2-4 GM/100ML-% IV SOLN
2.0000 g | Freq: Four times a day (QID) | INTRAVENOUS | Status: AC
  Administered 2019-06-09 – 2019-06-10 (×3): 2 g via INTRAVENOUS
  Filled 2019-06-09 (×3): qty 100

## 2019-06-09 SURGICAL SUPPLY — 76 items
AID PSTN UNV HD RSTRNT DISP (MISCELLANEOUS) ×1
BAG SPEC THK2 15X12 ZIP CLS (MISCELLANEOUS) ×1
BAG ZIPLOCK 12X15 (MISCELLANEOUS) ×3 IMPLANT
BIT DRILL 1.6MX128 (BIT) IMPLANT
BIT DRILL 1.6MX128MM (BIT)
BIT DRILL 170X2.5X (BIT) IMPLANT
BIT DRL 170X2.5X (BIT) ×1
BLADE EXTENDED COATED 6.5IN (ELECTRODE) ×3 IMPLANT
BLADE SAG 18X100X1.27 (BLADE) ×3 IMPLANT
CLOSURE WOUND 1/2 X4 (GAUZE/BANDAGES/DRESSINGS) ×1
COVER SURGICAL LIGHT HANDLE (MISCELLANEOUS) ×3 IMPLANT
COVER WAND RF STERILE (DRAPES) IMPLANT
DECANTER SPIKE VIAL GLASS SM (MISCELLANEOUS) ×3 IMPLANT
DRAPE INCISE IOBAN 66X45 STRL (DRAPES) ×3 IMPLANT
DRAPE ORTHO SPLIT 77X108 STRL (DRAPES) ×6
DRAPE SURG ORHT 6 SPLT 77X108 (DRAPES) ×2 IMPLANT
DRAPE U-SHAPE 47X51 STRL (DRAPES) ×3 IMPLANT
DRILL 2.5 (BIT) ×3
DRSG ADAPTIC 3X8 NADH LF (GAUZE/BANDAGES/DRESSINGS) ×3 IMPLANT
DRSG PAD ABDOMINAL 8X10 ST (GAUZE/BANDAGES/DRESSINGS) ×3 IMPLANT
DURAPREP 26ML APPLICATOR (WOUND CARE) ×6 IMPLANT
ELECT NDL TIP 2.8 STRL (NEEDLE) ×1 IMPLANT
ELECT NEEDLE TIP 2.8 STRL (NEEDLE) ×3 IMPLANT
ELECT REM PT RETURN 15FT ADLT (MISCELLANEOUS) ×3 IMPLANT
EPI LT SZ 1 (Orthopedic Implant) ×3 IMPLANT
EPIPHYSIS LT SZ 1 (Orthopedic Implant) IMPLANT
GAUZE SPONGE 4X4 12PLY STRL (GAUZE/BANDAGES/DRESSINGS) ×3 IMPLANT
GLENOSPHERE DXTEND STD 38 (Orthopedic Implant) IMPLANT
GLENSOPHERE DXTEND STD 38 (Orthopedic Implant) ×3 IMPLANT
GLOVE BIOGEL PI ORTHO PRO 7.5 (GLOVE) ×2
GLOVE BIOGEL PI ORTHO PRO SZ8 (GLOVE) ×2
GLOVE ORTHO TXT STRL SZ7.5 (GLOVE) ×3 IMPLANT
GLOVE PI ORTHO PRO STRL 7.5 (GLOVE) ×1 IMPLANT
GLOVE PI ORTHO PRO STRL SZ8 (GLOVE) ×1 IMPLANT
GLOVE SURG ORTHO 8.5 STRL (GLOVE) ×3 IMPLANT
GOWN STRL REUS W/TWL XL LVL3 (GOWN DISPOSABLE) ×6 IMPLANT
KIT BASIN OR (CUSTOM PROCEDURE TRAY) ×3 IMPLANT
KIT TURNOVER KIT A (KITS) IMPLANT
MANIFOLD NEPTUNE II (INSTRUMENTS) ×3 IMPLANT
METAGLENE DELTA EXTEND (Trauma) IMPLANT
METAGLENE DXTEND (Trauma) ×3 IMPLANT
NDL HYPO 25X1 1.5 SAFETY (NEEDLE) IMPLANT
NDL MAYO CATGUT SZ4 TPR NDL (NEEDLE) IMPLANT
NEEDLE HYPO 25X1 1.5 SAFETY (NEEDLE) IMPLANT
NEEDLE MAYO CATGUT SZ4 (NEEDLE) IMPLANT
NS IRRIG 1000ML POUR BTL (IV SOLUTION) ×3 IMPLANT
PACK SHOULDER (CUSTOM PROCEDURE TRAY) ×3 IMPLANT
PIN GUIDE 1.2 (PIN) ×2 IMPLANT
PIN GUIDE GLENOPHERE 1.5MX300M (PIN) ×2 IMPLANT
PIN METAGLENE 2.5 (PIN) ×2 IMPLANT
PROTECTOR NERVE ULNAR (MISCELLANEOUS) ×3 IMPLANT
RESTRAINT HEAD UNIVERSAL NS (MISCELLANEOUS) ×3 IMPLANT
SCREW 4.5X18MM (Screw) ×6 IMPLANT
SCREW 4.5X36MM (Screw) ×2 IMPLANT
SCREW BN 18X4.5XSTRL SHLDR (Screw) IMPLANT
SCREW LOCK 42 (Screw) ×2 IMPLANT
SLING ARM FOAM STRAP LRG (SOFTGOODS) IMPLANT
SLING ARM FOAM STRAP MED (SOFTGOODS) ×2 IMPLANT
SPACER 38 PLUS 3 (Spacer) ×2 IMPLANT
SPONGE LAP 4X18 RFD (DISPOSABLE) IMPLANT
STEM DELTA DIA 10 HA (Stem) ×2 IMPLANT
STRIP CLOSURE SKIN 1/2X4 (GAUZE/BANDAGES/DRESSINGS) ×2 IMPLANT
SUCTION FRAZIER HANDLE 10FR (MISCELLANEOUS) ×2
SUCTION TUBE FRAZIER 10FR DISP (MISCELLANEOUS) ×1 IMPLANT
SUT FIBERWIRE #2 38 T-5 BLUE (SUTURE) ×3
SUT MNCRL AB 4-0 PS2 18 (SUTURE) ×3 IMPLANT
SUT VIC AB 0 CT1 36 (SUTURE) ×6 IMPLANT
SUT VIC AB 0 CT2 27 (SUTURE) ×3 IMPLANT
SUT VIC AB 2-0 CT1 27 (SUTURE) ×3
SUT VIC AB 2-0 CT1 TAPERPNT 27 (SUTURE) ×1 IMPLANT
SUTURE FIBERWR #2 38 T-5 BLUE (SUTURE) ×1 IMPLANT
SYR CONTROL 10ML LL (SYRINGE) IMPLANT
TAPE CLOTH SURG 4X10 WHT LF (GAUZE/BANDAGES/DRESSINGS) ×2 IMPLANT
TOWEL OR 17X26 10 PK STRL BLUE (TOWEL DISPOSABLE) ×3 IMPLANT
TOWER CARTRIDGE SMART MIX (DISPOSABLE) IMPLANT
YANKAUER SUCT BULB TIP 10FT TU (MISCELLANEOUS) ×3 IMPLANT

## 2019-06-09 NOTE — Anesthesia Procedure Notes (Addendum)
Anesthesia Regional Block: Interscalene brachial plexus block   Pre-Anesthetic Checklist: ,, timeout performed, Correct Patient, Correct Site, Correct Laterality, Correct Procedure, Correct Position, site marked, Risks and benefits discussed,  Surgical consent,  Pre-op evaluation,  At surgeon's request and post-op pain management  Laterality: Left  Prep: chloraprep       Needles:  Injection technique: Single-shot  Needle Type: Echogenic Stimulator Needle     Needle Length: 5cm  Needle Gauge: 22     Additional Needles:   Procedures:, nerve stimulator,,, ultrasound used (permanent image in chart),,,,   Nerve Stimulator or Paresthesia:  Response: hand, 0.45 mA,   Additional Responses:   Narrative:  Start time: 06/09/2019 9:00 AM End time: 06/09/2019 9:10 AM Injection made incrementally with aspirations every 5 mL.  Performed by: Personally  Anesthesiologist: Janeece Riggers, MD  Additional Notes: Functioning IV was confirmed and monitors were applied.  A 34mm 22ga Arrow echogenic stimulator needle was used. Sterile prep and drape,hand hygiene and sterile gloves were used. Ultrasound guidance: relevant anatomy identified, needle position confirmed, local anesthetic spread visualized around nerve(s)., vascular puncture avoided.  Image printed for medical record. Negative aspiration and negative test dose prior to incremental administration of local anesthetic. The patient tolerated the procedure well.

## 2019-06-09 NOTE — Anesthesia Procedure Notes (Signed)
Procedure Name: Intubation Performed by: Gean Maidens, CRNA Pre-anesthesia Checklist: Patient identified, Emergency Drugs available, Suction available, Patient being monitored and Timeout performed Patient Re-evaluated:Patient Re-evaluated prior to induction Oxygen Delivery Method: Circle system utilized Preoxygenation: Pre-oxygenation with 100% oxygen Induction Type: IV induction Ventilation: Mask ventilation without difficulty Laryngoscope Size: Mac and 4 Grade View: Grade II Tube type: Oral Tube size: 7.0 mm Number of attempts: 1 Airway Equipment and Method: Stylet Placement Confirmation: positive ETCO2 and breath sounds checked- equal and bilateral Secured at: 21 cm Tube secured with: Tape Dental Injury: Teeth and Oropharynx as per pre-operative assessment

## 2019-06-09 NOTE — Op Note (Signed)
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  PROCEDURE PERFORMED:  Left reverse total shoulder arthroplasty using DePuy Delta Xtend prosthesis.  ATTENDING SURGEON:  Netta Cedars, MD  ASSISTANT:  Darol Destine, [place], who was scrubbed during the entire procedure and necessary for satisfactory completion of surgery.  ANESTHESIA:  General anesthesia was used plus interscalene block.  ESTIMATED BLOOD LOSS:  Less than 100 mL.  FLUID REPLACEMENT:  1200 mL crystalloid.  INSTRUMENT COUNTS:  Correct.  COMPLICATIONS:  No complications.  ANTIBIOTICS:  Perioperative antibiotics were given.  INDICATIONS:  The patient is a 73 year old female with worsening left shoulder pain and dysfunction secondary to rotator cuff tear arthropathy.  The patient has had progressive pain despite conservative management and desires operative treatment to  restore function and eliminate pain.  Informed consent was obtained.  DESCRIPTION OF PROCEDURE:  After an adequate level of anesthesia was achieved, the patient was positioned in modified beach chair position.  Left shoulder correctly identified, sterilely prepped and draped in the usual manner.  Time-out called verifying  correct patient, correct site.  We then entered the patient's shoulder using a standard anterior deltopectoral approach starting at the coracoid process and extending down to the anterior humerus, dissection down through subcutaneous tissues using Bovie.   We identified the cephalic vein and took that laterally with the deltoid pectoralis taken medially.  Conjoined tendon identified and  retracted medially.  We released the subscapularis remnant which was not repairable off the lesser tuberosity and  tagged for protection of the axillary nerve.  We then did a release of the anterior capsule and the humerus, progressively externally rotating and extending the shoulder, delivering the humeral head out of the wound.  We entered the proximal humerus with  a 6 mm reamer, reamed up to a size 10, placed our 10 mm intramedullary guide for resection of the head, which we resected at 10 degrees of retroversion.  We then went ahead and removed excess osteophytes with a rongeur, subluxed the humerus posteriorly,  and then gained 360-degree exposure of the glenoid face.  We did a capsular labral excision getting down to the very worn glenoid face, which was more worn superiorly, consistent with rotator cuff tear arthropathy.  We used a Cobb elevator to remove the  remaining cartilage on the inferior portion of the glenoid face.  We then placed our center guide pin and then reamed for the metaglene baseplate, drilled our central peg hole and impacted the metaglene into position.  This was well situated inferiorly.   We drilled and placed a 42 screw inferiorly, a 36 at the base of the coracoid and an 18 nonlocked anterior and posterior.  The 2 locked screws were secured.  The baseplate had good baseplate support as well as good security.  We then placed a 38  standard glenosphere into position and screwed that home and impacted and screwed again, had it flush and down securely against the baseplate.  We did a finger sweep to make sure the soft tissue was not incorporated into the glenosphere base plate  construct.  We then went ahead and extended the shoulder back out of the wound, prepared the metaphysis of the humerus with an Epi-1 left for this  left-sided shoulder.  Once we had that reaming done, we took a 10 stem with a 1 left eccentric and set on  the 0 setting and placed in 10 degrees of  retroversion.  We impacted that into position, reduced the shoulder with a 38+3 poly and were happy with that soft tissue balancing.  We removed all trial components, irrigated thoroughly, and then using  impaction grafting technique implanted the press-fit stem, which was an HA-coated 10 stem with the size 1 left metaphysis set on 0 setting and placed in 10 degrees of retroversion.  We used an impaction grafting technique.  We had a very secure stem.   Placed a 38+3 real poly and impacted that in place.  Reduced the shoulder, had a nice secure pop as it went in.  A nice tight conjoin.  No gapping with inferior pole or external rotation.  We then irrigated thoroughly and resected the remnant of the  subscap and then repaired the deltopectoral interval with 0 Vicryl suture followed by 2-0 Vicryl for subcutaneous closure and 4-0 Monocryl for skin.  Steri-Strips were applied followed by a sterile dressing.  The patient tolerated surgery well.  LN/NUANCE  D:06/09/2019 T:06/09/2019 JOB:[id number]

## 2019-06-09 NOTE — Transfer of Care (Signed)
Immediate Anesthesia Transfer of Care Note  Patient: April Brewer  Procedure(s) Performed: REVERSE SHOULDER ARTHROPLASTY (Left Shoulder)  Patient Location: PACU  Anesthesia Type:General  Level of Consciousness: sedated, patient cooperative and responds to stimulation  Airway & Oxygen Therapy: Patient Spontanous Breathing and Patient connected to nasal cannula oxygen  Post-op Assessment: Report given to RN and Post -op Vital signs reviewed and stable  Post vital signs: Reviewed and stable  Last Vitals:  Vitals Value Taken Time  BP 132/66 06/09/19 1208  Temp    Pulse 67 06/09/19 1210  Resp 16 06/09/19 1210  SpO2 100 % 06/09/19 1210  Vitals shown include unvalidated device data.  Last Pain:  Vitals:   06/09/19 0756  TempSrc: Oral      Patients Stated Pain Goal: 2 (30/10/40 4591)  Complications: No apparent anesthesia complications

## 2019-06-09 NOTE — Anesthesia Postprocedure Evaluation (Signed)
Anesthesia Post Note  Patient: April Brewer  Procedure(s) Performed: REVERSE SHOULDER ARTHROPLASTY (Left Shoulder)     Patient location during evaluation: PACU Anesthesia Type: General Level of consciousness: awake and alert Pain management: pain level controlled Vital Signs Assessment: post-procedure vital signs reviewed and stable Respiratory status: spontaneous breathing, nonlabored ventilation, respiratory function stable and patient connected to nasal cannula oxygen Cardiovascular status: blood pressure returned to baseline and stable Postop Assessment: no apparent nausea or vomiting Anesthetic complications: no    Last Vitals:  Vitals:   06/09/19 1245 06/09/19 1314  BP: 123/64 107/61  Pulse: 61 73  Resp: 16 16  Temp: 36.6 C 36.4 C  SpO2: 100% 97%    Last Pain:  Vitals:   06/09/19 1356  TempSrc:   PainSc: 0-No pain                 Liora Myles

## 2019-06-09 NOTE — Progress Notes (Signed)
Assisted Dr. Oddono with left, ultrasound guided, interscalene  block. Side rails up, monitors on throughout procedure. See vital signs in flow sheet. Tolerated Procedure well. 

## 2019-06-09 NOTE — Discharge Instructions (Signed)
Ice to the shoulder constantly.  Keep the incision covered and clean and dry for one week, then ok to get it wet in the shower. ° °Do exercise as instructed several times per day. ° °DO NOT reach behind your back or push up out of a chair with the operative arm. ° °Use a sling while you are up and around for comfort, may remove while seated.  Keep pillow propped behind the operative elbow. ° °Follow up with Dr Sajid Ruppert in two weeks in the office, call [phone] for appt °

## 2019-06-09 NOTE — Anesthesia Preprocedure Evaluation (Signed)
Anesthesia Evaluation  Patient identified by MRN, date of birth, ID band Patient awake    Reviewed: Allergy & Precautions, NPO status , Patient's Chart, lab work & pertinent test results  Airway Mallampati: II  TM Distance: >3 FB Neck ROM: Full    Dental no notable dental hx. (+) Teeth Intact   Pulmonary neg pulmonary ROS,    Pulmonary exam normal breath sounds clear to auscultation       Cardiovascular hypertension, Normal cardiovascular exam Rhythm:Regular Rate:Normal     Neuro/Psych negative neurological ROS  negative psych ROS   GI/Hepatic negative GI ROS, Neg liver ROS,   Endo/Other  Hypothyroidism   Renal/GU negative Renal ROS  negative genitourinary   Musculoskeletal negative musculoskeletal ROS (+)   Abdominal   Peds negative pediatric ROS (+)  Hematology negative hematology ROS (+) Blood dyscrasia, anemia ,   Anesthesia Other Findings   Reproductive/Obstetrics negative OB ROS                             Anesthesia Physical  Anesthesia Plan  ASA: III  Anesthesia Plan: General   Post-op Pain Management: GA combined w/ Regional for post-op pain   Induction: Intravenous  PONV Risk Score and Plan: 0 and Ondansetron  Airway Management Planned: Oral ETT  Additional Equipment:   Intra-op Plan:   Post-operative Plan: Extubation in OR  Informed Consent: I have reviewed the patients History and Physical, chart, labs and discussed the procedure including the risks, benefits and alternatives for the proposed anesthesia with the patient or authorized representative who has indicated his/her understanding and acceptance.     Dental advisory given  Plan Discussed with: CRNA, Surgeon and Anesthesiologist  Anesthesia Plan Comments: (Discussed both nerve block for pain relief post-op and GA; including NV, sore throat, dental injury, and pulmonary complications)         Anesthesia Quick Evaluation

## 2019-06-09 NOTE — Plan of Care (Signed)
  Problem: Education: Goal: Knowledge of General Education information will improve Description: Including pain rating scale, medication(s)/side effects and non-pharmacologic comfort measures Outcome: Progressing   Problem: Health Behavior/Discharge Planning: Goal: Ability to manage health-related needs will improve Outcome: Progressing   Problem: Clinical Measurements: Goal: Ability to maintain clinical measurements within normal limits will improve Outcome: Progressing Goal: Will remain free from infection Outcome: Progressing Goal: Diagnostic test results will improve Outcome: Progressing Goal: Respiratory complications will improve Outcome: Progressing Goal: Cardiovascular complication will be avoided Outcome: Progressing   Problem: Activity: Goal: Risk for activity intolerance will decrease Outcome: Progressing   Problem: Nutrition: Goal: Adequate nutrition will be maintained Outcome: Progressing   Problem: Coping: Goal: Level of anxiety will decrease Outcome: Progressing   Problem: Elimination: Goal: Will not experience complications related to bowel motility Outcome: Progressing Goal: Will not experience complications related to urinary retention Outcome: Progressing   Problem: Pain Managment: Goal: General experience of comfort will improve Outcome: Progressing   Problem: Safety: Goal: Ability to remain free from injury will improve Outcome: Progressing   Problem: Skin Integrity: Goal: Risk for impaired skin integrity will decrease Outcome: Progressing   Problem: Education: Goal: Knowledge of the prescribed therapeutic regimen will improve Outcome: Progressing Goal: Understanding of activity limitations/precautions following surgery will improve Outcome: Progressing Goal: Individualized Educational Video(s) Outcome: Progressing   Problem: Activity: Goal: Ability to tolerate increased activity will improve Outcome: Progressing   Problem: Pain  Management: Goal: Pain level will decrease with appropriate interventions Outcome: Progressing  Plan of care discussed with patient today. Reminded to call for help and never get up alone.

## 2019-06-09 NOTE — Brief Op Note (Signed)
06/09/2019  11:59 AM  PATIENT:  April Brewer  73 y.o. female  PRE-OPERATIVE DIAGNOSIS:  Left shoulder end stage osteoarthritis  POST-OPERATIVE DIAGNOSIS:  Left shoulder end stage osteoarthritis  PROCEDURE:  Procedure(s): REVERSE SHOULDER ARTHROPLASTY (Left) DePuy Delta Xtend  SURGEON:  Surgeon(s) and Role:    Netta Cedars, MD - Primary  PHYSICIAN ASSISTANT:   ASSISTANTS: [place] Bruns, PA-C   ANESTHESIA:   Regional and general   EBL:  200 mL   BLOOD ADMINISTERED:none  DRAINS: none   LOCAL MEDICATIONS USED:  MARCAINE     SPECIMEN:  No Specimen  DISPOSITION OF SPECIMEN:  N/A  COUNTS:  YES  TOURNIQUET:  * No tourniquets in log *  DICTATION: .Other Dictation: Dictation Number [id number]  PLAN OF CARE: Admit to inpatient   PATIENT DISPOSITION:  PACU - hemodynamically stable.   Delay start of Pharmacological VTE agent (>24hrs) due to surgical blood loss or risk of bleeding: no

## 2019-06-10 LAB — BASIC METABOLIC PANEL
Anion gap: 10 (ref 5–15)
BUN: 18 mg/dL (ref 8–23)
CO2: 23 mmol/L (ref 22–32)
Calcium: 8.6 mg/dL — ABNORMAL LOW (ref 8.9–10.3)
Chloride: 100 mmol/L (ref 98–111)
Creatinine, Ser: 0.87 mg/dL (ref 0.44–1.00)
GFR calc Af Amer: >60 mL/min (ref >60)
GFR calc non Af Amer: >60 mL/min (ref >60)
Glucose, Bld: 115 mg/dL — ABNORMAL HIGH (ref 70–99)
Potassium: 3.9 mmol/L (ref 3.5–5.1)
Sodium: 133 mmol/L — ABNORMAL LOW (ref 135–145)

## 2019-06-10 LAB — HEMOGLOBIN AND HEMATOCRIT, BLOOD
HCT: 24.8 % — ABNORMAL LOW (ref 36.0–46.0)
Hemoglobin: 7.8 g/dL — ABNORMAL LOW (ref 12.0–15.0)

## 2019-06-10 NOTE — Plan of Care (Deleted)
Adequate for discharge.

## 2019-06-10 NOTE — Progress Notes (Signed)
April Brewer  MRN: [id number] DOB/Age: 1946/06/28 73 y.o. Physician: Ander Slade, M.D. 1 Day Post-Op Procedure(s) (LRB): REVERSE SHOULDER ARTHROPLASTY (Left)  Subjective: Resting comfortably in bed.  Has been up to the bathroom independently this morning.  Reports continued mild "numbness" in the left hand which she states is gradually resolving. Vital Signs Temp:  [97.5 F (36.4 C)-98.8 F (37.1 C)] 98.3 F (36.8 C) (06/13 0927) Pulse Rate:  [59-92] 92 (06/13 0927) Resp:  [14-18] 18 (06/13 0927) BP: (81-148)/(52-72) 108/72 (06/13 0927) SpO2:  [95 %-100 %] 95 % (06/13 0927)  Lab Results Recent Labs    06/10/19 0242  HGB 7.8*  HCT 24.8*   BMET Recent Labs    06/10/19 0242  NA 133*  K 3.9  CL 100  CO2 23  GLUCOSE 115*  BUN 18  CREATININE 0.87  CALCIUM 8.6*   INR  Date Value Ref Range Status  03/22/2014 1.1 (H) 0.8 - 1.0 ratio Final     Exam  Left shoulder demonstrates bulky dry dressing in place.  She has good digital motion.  Sling intact.  Plan OT visit then discharge home.  Follow-up with Dr. Veverly Fells. April Brewer M April Brewer 06/10/2019, 9:45 AM    Contact # [phone]

## 2019-06-10 NOTE — Evaluation (Signed)
Occupational Therapy Evaluation Patient Details Name: April Brewer MRN: [id number] DOB: [birth year] Today's Date: 06/10/2019    History of Present Illness REVERSE SHOULDER ARTHROPLASTY (Left)   Clinical Impression   OT education complete.  Handout provided.  Education provided regarding ADL activity and elbow, hand and wrist ROM.  Pt has A at home with ADL activity.      Follow Up Recommendations  Follow surgeon's recommendation for DC plan and follow-up therapies    Equipment Recommendations  None recommended by OT    Recommendations for Other Services       Precautions / Restrictions Precautions Precautions: Shoulder Shoulder Interventions: Shoulder sling/immobilizer;Off for dressing/bathing/exercises Required Braces or Orthoses: Sling Restrictions LUE Weight Bearing: Non weight bearing Other Position/Activity Restrictions: LUE      Mobility Bed Mobility Overal bed mobility: Modified Independent                Transfers Overall transfer level: Needs assistance Equipment used: 1 person hand held assist Transfers: Sit to/from [employer] Transfers Sit to Stand: Min guard Stand pivot transfers: Min guard       General transfer comment: min guard for safety    Balance Overall balance assessment: Mild deficits observed, not formally tested                                                          Communication Communication Communication: No difficulties   Cognition Arousal/Alertness: Awake/alert Behavior During Therapy: WFL for tasks assessed/performed Overall Cognitive Status: Within Functional Limits for tasks assessed                                           Shoulder Instructions Shoulder Instructions Donning/doffing shirt without moving shoulder: Minimal assistance;Patient able to independently direct caregiver Method for sponge bathing under operated UE: Minimal assistance;Patient able to  independently direct caregiver Donning/doffing sling/immobilizer: Minimal assistance;Patient able to independently direct caregiver Correct positioning of sling/immobilizer: Minimal assistance;Patient able to independently direct caregiver ROM for elbow, wrist and digits of operated UE: Minimal assistance;Patient able to independently direct caregiver Sling wearing schedule (on at all times/off for ADL's): Minimal assistance;Patient able to independently direct caregiver Proper positioning of operated UE when showering: Minimal assistance;Patient able to independently direct caregiver Positioning of UE while sleeping: Minimal assistance;Patient able to independently direct caregiver    Home Living Family/patient expects to be discharged to:: Private residence   Available Help at Discharge: Family   Home Access: Level entry     Home Layout: Two level   Alternate Level Stairs-Rails: Right Bathroom Shower/Tub: Tub/shower unit   Bathroom Toilet: Handicapped height     Home Equipment: Environmental consultant - 2 wheels;Cane - single point;Bedside commode   Additional Comments: niece and son will assist      Prior Functioning/Environment Level of Independence: Independent;Independent with assistive device(s)                          OT Goals(Current goals can be found in the care plan section) Acute Rehab OT Goals Patient Stated Goal: home today OT Goal Formulation: With patient Time For Goal Achievement: 06/10/19 Potential to Achieve Goals: Good  OT Frequency:  AM-PAC OT "6 Clicks" Daily Activity     Outcome Measure Help from another person eating meals?: A Little Help from another person taking care of personal grooming?: A Little Help from another person toileting, which includes using toliet, bedpan, or urinal?: A Little Help from another person bathing (including washing, rinsing, drying)?: A Little Help from another person to put on and taking off regular upper body  clothing?: A Little Help from another person to put on and taking off regular lower body clothing?: A Little 6 Click Score: 18   End of Session Equipment Utilized During Treatment: Other (comment)(sling) Nurse Communication: Mobility status  Activity Tolerance: Patient tolerated treatment well Patient left: in chair  OT Visit Diagnosis: Muscle weakness (generalized) (M62.81)                Time: 4193-7902 OT Time Calculation (min): 15 min Charges:  OT General Charges $OT Visit: 1 Visit OT Evaluation $OT Eval Moderate Complexity: 1 Mod  Kari Baars, OT Acute Rehabilitation Services [phone] Office- [phone]     Aisea Bouldin, Edwena Felty D 06/10/2019, 11:47 AM

## 2019-06-12 ENCOUNTER — Encounter (HOSPITAL_COMMUNITY): Payer: Self-pay | Admitting: Orthopedic Surgery

## 2019-06-12 NOTE — Addendum Note (Signed)
Addendum  created 06/12/19 2041 by Janeece Riggers, MD   Clinical Note Signed, Intraprocedure Blocks edited

## 2019-06-15 NOTE — Discharge Summary (Signed)
Orthopedic Discharge Summary        Physician Discharge Summary  Patient ID: April Brewer MRN: [id number] DOB/AGE: November 02, 1946 73 y.o.  Admit date: 06/09/2019 Discharge date: 06/15/2019   Procedures:  Procedure(s) (LRB): REVERSE SHOULDER ARTHROPLASTY (Left)  Attending Physician:  Dr. Esmond Plants  Admission Diagnoses:   Left shoulder rotator cuff tear arthropathy  Discharge Diagnoses: same    Past Medical History:  Diagnosis Date  . ALLERGIC RHINITIS 06/02/2007  . Allergy   . Arthritis    DDD of cervical, thoracic, and lumbar spine  . Blood transfusion without reported diagnosis   . Cataract    Bilateral removed cateracts  . Cervical spondylosis    C5-6 and C6-7.  Pain mgmt as per Dr. Nelva Bush.  . Chronic low back pain 02/18/2009   s/p fusion L3-4 through L5-S1.  Pain meds per Dr. Nelva Bush  . DEPRESSION 06/02/2007  . Diverticulosis   . GERD (gastroesophageal reflux disease)   . Heart murmur   . Hiatal hernia   . HYPOTHYROIDISM 06/02/2007   GOITER  . INSOMNIA 06/02/2007  . Lymphocytic colitis   . Muscle spasms of lower extremity   . Nasal septal perforation    chronic; hx of epistaxis  . Normocytic anemia 04/2015   HEME + in ED 08/07/15--endoscopic eval unrevealing.  Iron studies fine.  ?Anemia of chronic dz (lymphocytic colitis?)  . OSTEOPENIA 02/18/2009   Repeat DEXA 07/2014 showed osteoporosis by T score in radius but spine and hip T scores were normal-continue vit D and calcium and repeat DEXA 08/2016 unchanged.  Repeat DEXA 2 yrs.  . Peripheral edema    LE's, nonpitting    PCP: Egbert Garibaldi, PA-C   Discharged Condition: good  Hospital Course:  Patient underwent the above stated procedure on 06/09/2019. Patient tolerated the procedure well and brought to the recovery room in good condition and subsequently to the floor. Patient had an uncomplicated hospital course and was stable for discharge.   Disposition:  with follow up in 2 weeks   Follow-up Information    Netta Cedars, MD. Call in 2 weeks.   Specialty: Orthopedic Surgery Why: [phone] Contact information: [address] [place] [address] [phone]             Allergies as of 06/10/2019      Reactions   Latex Rash      Medication List    STOP taking these medications   oxyCODONE 5 MG immediate release tablet Commonly known as: Oxy IR/ROXICODONE     TAKE these medications   acetaminophen 500 MG tablet Commonly known as: TYLENOL Take 2 tablets (1,000 mg total) by mouth every 8 (eight) hours.   alprazolam 2 MG tablet Commonly known as: XANAX Take one-half to one tablet by mouth twice daily as needed What changed:   how much to take  how to take this  when to take this  additional instructions   atorvastatin 10 MG tablet Commonly known as: LIPITOR Take 10 mg by mouth daily.   docusate sodium 100 MG capsule Commonly known as: Colace Take 1 capsule (100 mg total) by mouth 2 (two) times daily.   ferrous sulfate 325 (65 FE) MG tablet Commonly known as: FerrouSul Take 1 tablet (325 mg total) by mouth 3 (three) times daily with meals.   furosemide 20 MG tablet Commonly known as: Lasix Take 1 tablet (20 mg total) by mouth daily as needed for fluid or edema.   Fusion Plus Caps Take  1 capsule by mouth daily.   levothyroxine 75 MCG tablet Commonly known as: SYNTHROID TAKE 1 TABLET BY MOUTH ONCE DAILY WITH BREAKFAST What changed: See the new instructions.   methocarbamol 750 MG tablet Commonly known as: ROBAXIN Take 750 mg by mouth every 8 (eight) hours as needed for muscle spasms.   methocarbamol 500 MG tablet Commonly known as: Robaxin Take 1 tablet (500 mg total) by mouth every 6 (six) hours as needed for muscle spasms.   omeprazole 40 MG capsule Commonly known as: PRILOSEC TAKE 1 CAPSULE BY MOUTH  DAILY   polyethylene glycol 17 g packet Commonly known as: MIRALAX / GLYCOLAX Take 17 g by mouth 2 (two) times daily.    sertraline 100 MG tablet Commonly known as: ZOLOFT TAKE ONE AND ONE-HALF  TABLET BY MOUTH NIGHTLY AT  BEDTIME What changed: See the new instructions.   traZODone 50 MG tablet Commonly known as: DESYREL TAKE 4 TABLETS BY MOUTH  EVERY NIGHT AT BEDTIME AS  NEEDED FOR INSOMNIA What changed: See the new instructions.   triamterene-hydrochlorothiazide 37.5-25 MG tablet Commonly known as: MAXZIDE-25 Take 1 tablet by mouth daily as needed (swelling).         Signed: Augustin Schooling 06/15/2019, 7:54 AM  [facility] is now [employer] [address], [place], [place], [place] [address] Phone: [place]

## 2019-06-22 DIAGNOSIS — Z471 Aftercare following joint replacement surgery: Secondary | ICD-10-CM | POA: Diagnosis not present

## 2019-06-22 DIAGNOSIS — Z96612 Presence of left artificial shoulder joint: Secondary | ICD-10-CM | POA: Diagnosis not present

## 2019-07-03 DIAGNOSIS — M25561 Pain in right knee: Secondary | ICD-10-CM | POA: Insufficient documentation

## 2019-07-04 DIAGNOSIS — M25562 Pain in left knee: Secondary | ICD-10-CM | POA: Diagnosis not present

## 2019-07-04 DIAGNOSIS — M25561 Pain in right knee: Secondary | ICD-10-CM | POA: Diagnosis not present

## 2019-07-20 DIAGNOSIS — Z471 Aftercare following joint replacement surgery: Secondary | ICD-10-CM | POA: Diagnosis not present

## 2019-07-20 DIAGNOSIS — Z96612 Presence of left artificial shoulder joint: Secondary | ICD-10-CM | POA: Diagnosis not present

## 2019-07-28 DIAGNOSIS — Z79899 Other long term (current) drug therapy: Secondary | ICD-10-CM | POA: Diagnosis not present

## 2019-07-28 DIAGNOSIS — G894 Chronic pain syndrome: Secondary | ICD-10-CM | POA: Diagnosis not present

## 2019-07-28 DIAGNOSIS — Z5181 Encounter for therapeutic drug level monitoring: Secondary | ICD-10-CM | POA: Diagnosis not present

## 2019-08-10 DIAGNOSIS — Z96643 Presence of artificial hip joint, bilateral: Secondary | ICD-10-CM | POA: Diagnosis not present

## 2019-08-10 DIAGNOSIS — Z96641 Presence of right artificial hip joint: Secondary | ICD-10-CM | POA: Diagnosis not present

## 2019-08-10 DIAGNOSIS — Z471 Aftercare following joint replacement surgery: Secondary | ICD-10-CM | POA: Diagnosis not present

## 2019-08-23 DIAGNOSIS — Z79899 Other long term (current) drug therapy: Secondary | ICD-10-CM | POA: Diagnosis not present

## 2019-08-23 DIAGNOSIS — Z1159 Encounter for screening for other viral diseases: Secondary | ICD-10-CM | POA: Diagnosis not present

## 2019-08-23 DIAGNOSIS — A499 Bacterial infection, unspecified: Secondary | ICD-10-CM | POA: Diagnosis not present

## 2019-08-23 DIAGNOSIS — N390 Urinary tract infection, site not specified: Secondary | ICD-10-CM | POA: Diagnosis not present

## 2019-08-23 DIAGNOSIS — I10 Essential (primary) hypertension: Secondary | ICD-10-CM | POA: Diagnosis not present

## 2019-08-23 DIAGNOSIS — R0602 Shortness of breath: Secondary | ICD-10-CM | POA: Diagnosis not present

## 2019-08-23 DIAGNOSIS — Z0000 Encounter for general adult medical examination without abnormal findings: Secondary | ICD-10-CM | POA: Diagnosis not present

## 2019-08-23 DIAGNOSIS — Z1339 Encounter for screening examination for other mental health and behavioral disorders: Secondary | ICD-10-CM | POA: Diagnosis not present

## 2019-09-05 DIAGNOSIS — M25512 Pain in left shoulder: Secondary | ICD-10-CM | POA: Diagnosis not present

## 2019-09-05 DIAGNOSIS — Z96612 Presence of left artificial shoulder joint: Secondary | ICD-10-CM | POA: Diagnosis not present

## 2019-09-05 DIAGNOSIS — M19012 Primary osteoarthritis, left shoulder: Secondary | ICD-10-CM | POA: Diagnosis not present

## 2019-10-12 IMAGING — DX DG PORTABLE PELVIS
1 series · 1 of 1 positions shown · non-contrast
Comparison: Portable pelvis film of 05/31/2012

CLINICAL DATA: Post right total hip replacement

EXAM:
PORTABLE PELVIS 1-2 VIEWS

[pelvis ap]
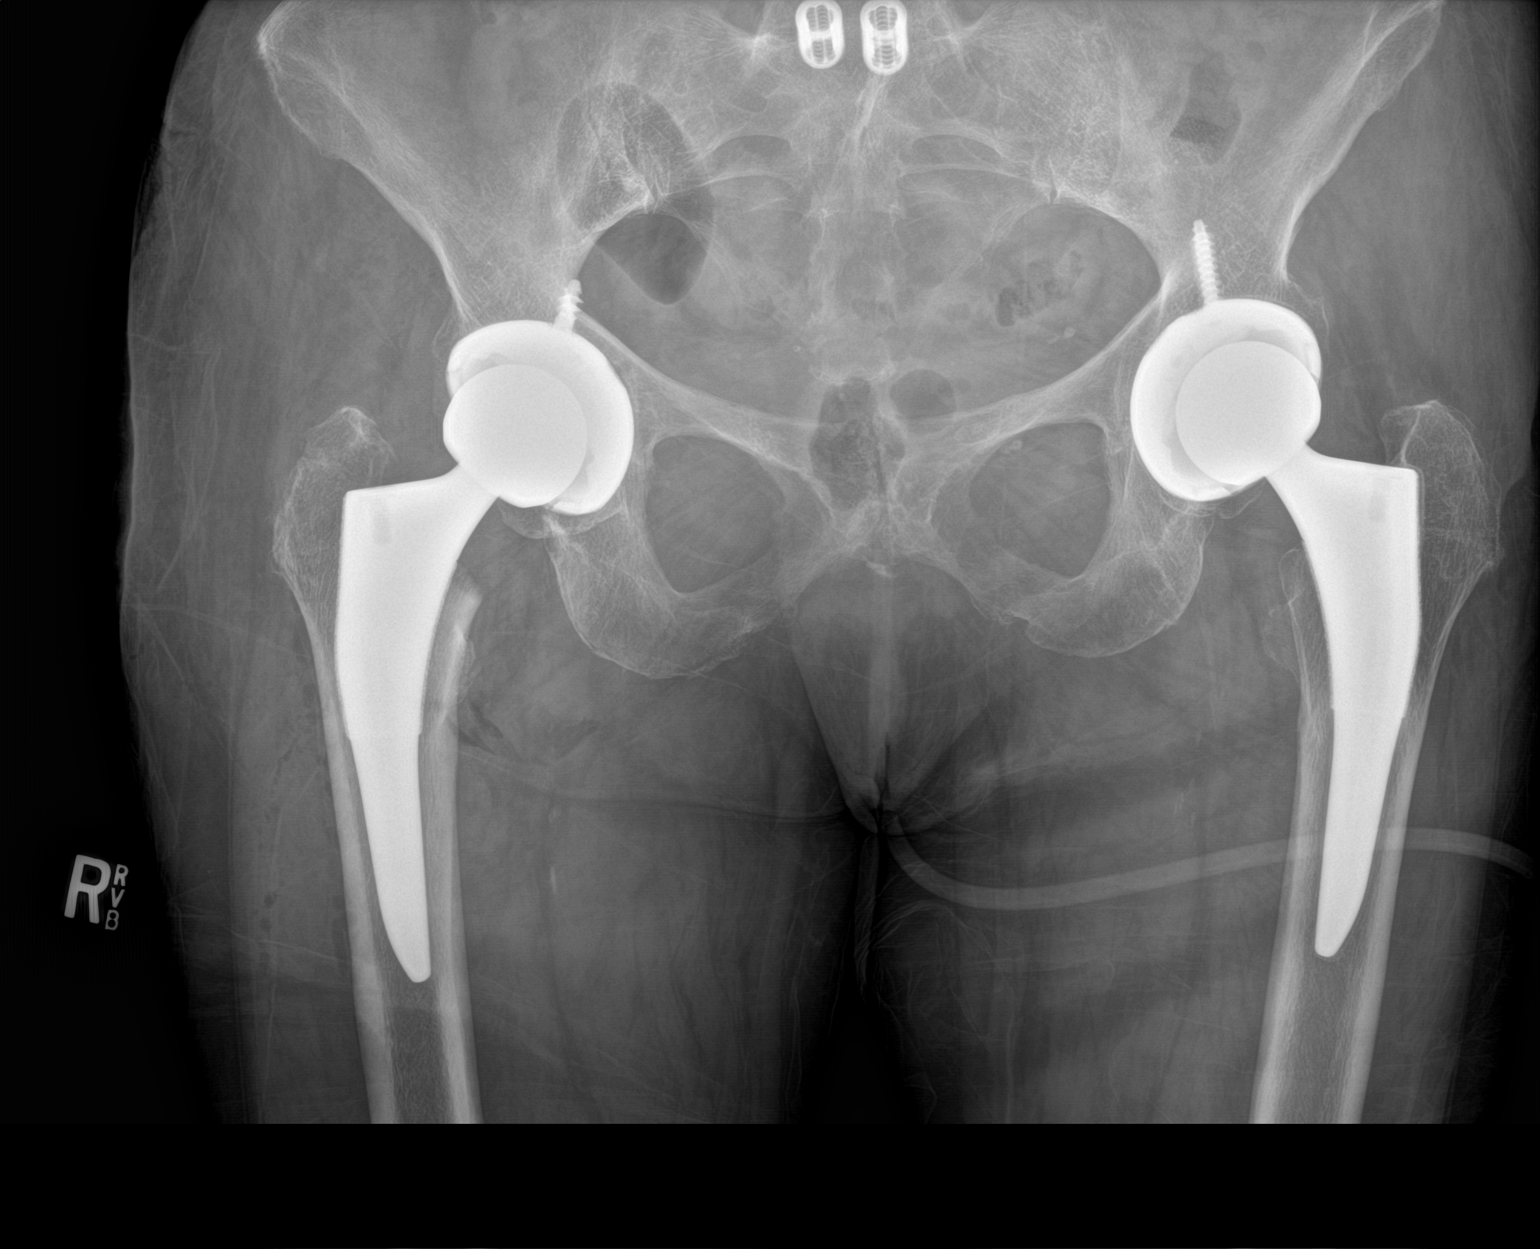

[1 of 1 positions shown; findings below may reference images not displayed]

FINDINGS: The femoral and acetabular components of the right total hip
replacement appear to be in good position. No complicating features
are seen. Left total hip replacement components remain in good
position. The bones appear somewhat osteopenic.
IMPRESSION: Right total hip replacement components in good position. No
complicating features.

## 2019-11-02 IMAGING — DX DG HIP (WITH OR WITHOUT PELVIS) 2-3V*R*
1 series · 1 of 1 positions shown · non-contrast
Comparison: 08/30/2018

CLINICAL DATA: 72-year-old female with a history of right hip
dislocation

EXAM:
DG HIP (WITH OR WITHOUT PELVIS) 2-3V RIGHT

[pelvis ap]
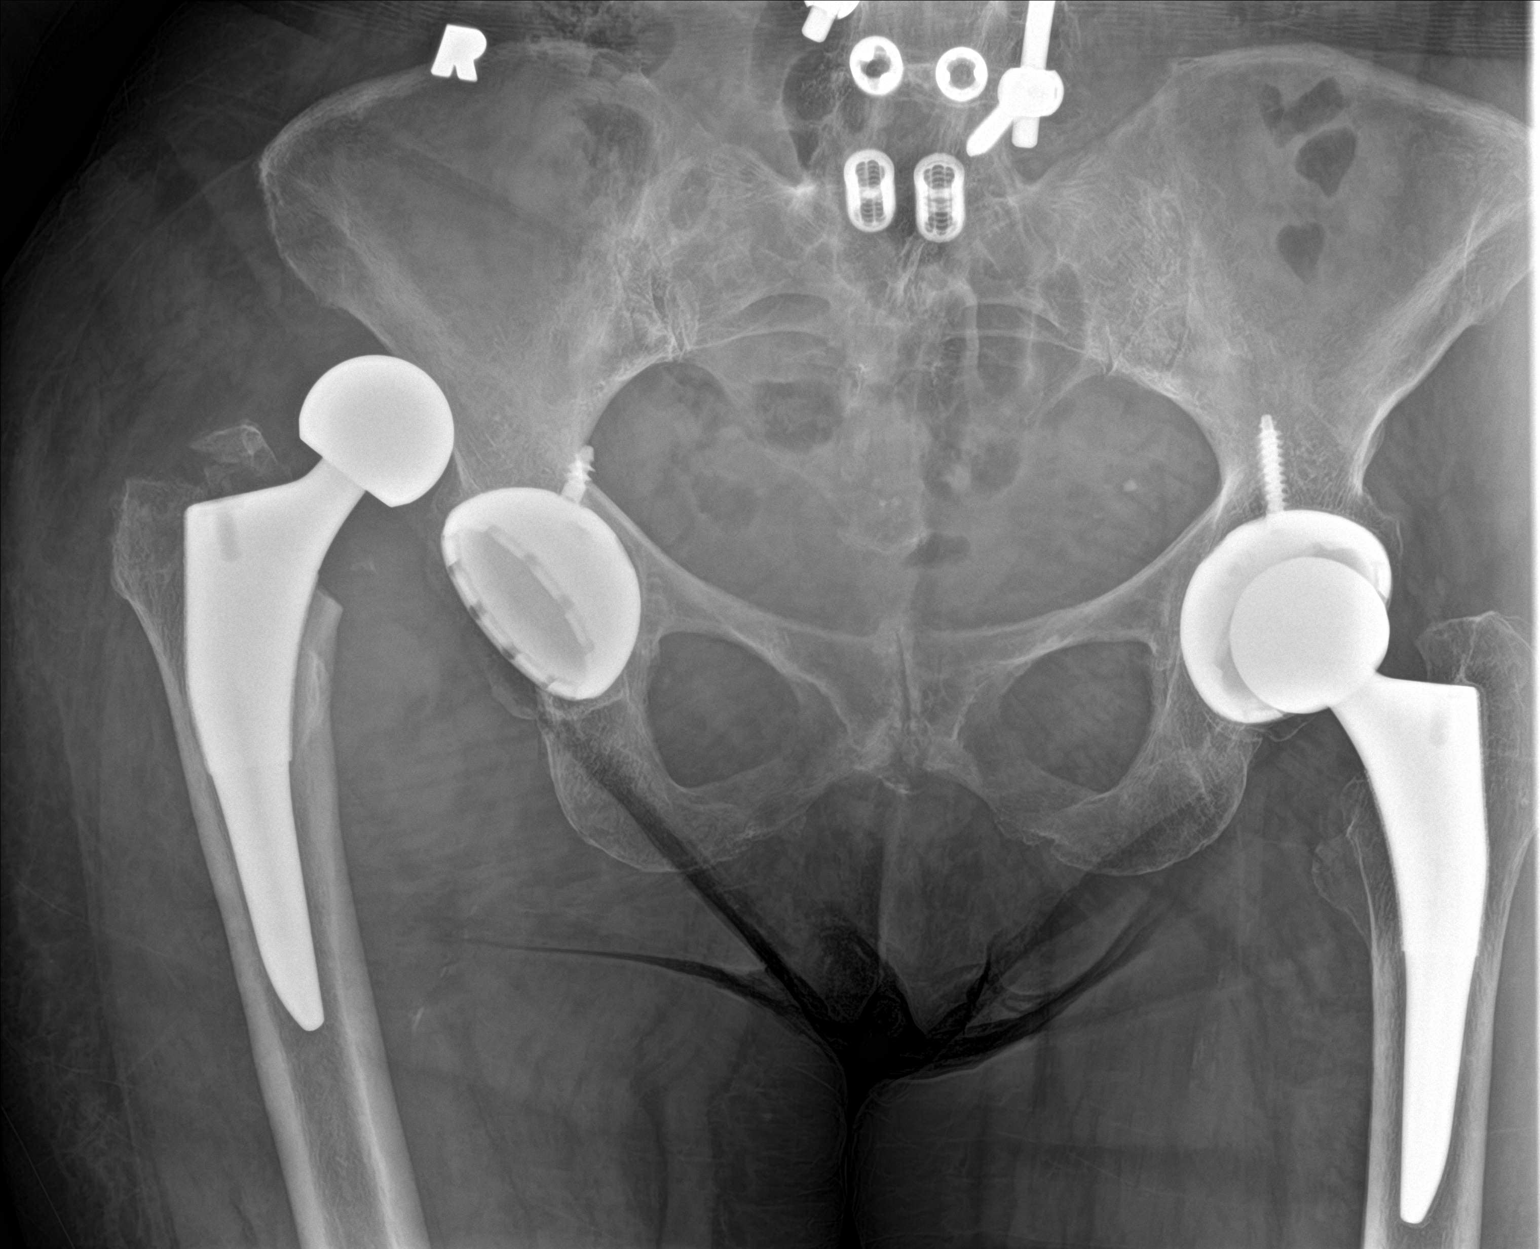

[1 of 1 positions shown; findings below may reference images not displayed]

FINDINGS: Osteopenia.

Bony pelvic ring intact.

Interval proximal dislocation of the right arthroplasty components,
with associated avulsion fracture of the greater trochanter.

Components of left hip arthroplasty maintain alignment.

Associated soft tissue swelling.
IMPRESSION: Dislocation of right hip arthroplasty components, with associated
avulsion fracture of the greater trochanter.

Osteopenia.

Left hip arthroplasty components maintain alignment.

## 2019-11-16 DIAGNOSIS — M5136 Other intervertebral disc degeneration, lumbar region: Secondary | ICD-10-CM | POA: Diagnosis not present

## 2019-11-16 DIAGNOSIS — Z79891 Long term (current) use of opiate analgesic: Secondary | ICD-10-CM | POA: Diagnosis not present

## 2019-11-16 DIAGNOSIS — G894 Chronic pain syndrome: Secondary | ICD-10-CM | POA: Diagnosis not present

## 2019-12-01 DIAGNOSIS — I10 Essential (primary) hypertension: Secondary | ICD-10-CM | POA: Diagnosis not present

## 2019-12-01 DIAGNOSIS — E7800 Pure hypercholesterolemia, unspecified: Secondary | ICD-10-CM | POA: Diagnosis not present

## 2019-12-01 DIAGNOSIS — Z79899 Other long term (current) drug therapy: Secondary | ICD-10-CM | POA: Diagnosis not present

## 2019-12-01 DIAGNOSIS — E039 Hypothyroidism, unspecified: Secondary | ICD-10-CM | POA: Diagnosis not present

## 2019-12-01 DIAGNOSIS — Z1159 Encounter for screening for other viral diseases: Secondary | ICD-10-CM | POA: Diagnosis not present

## 2019-12-01 DIAGNOSIS — E559 Vitamin D deficiency, unspecified: Secondary | ICD-10-CM | POA: Diagnosis not present

## 2019-12-01 LAB — VITAMIN D 25 HYDROXY (VIT D DEFICIENCY, FRACTURES): Vit D, 25-Hydroxy: 29.89

## 2019-12-01 LAB — LIPID PANEL
Cholesterol: 200 (ref 0–200)
HDL: 86 — AB (ref 35–70)
LDL Cholesterol: 93
Triglycerides: 107 (ref 40–160)

## 2019-12-01 LAB — COMPREHENSIVE METABOLIC PANEL
Albumin: 4.3 (ref 3.5–5.0)
Calcium: 10.2 (ref 8.7–10.7)
Globulin: 3.4

## 2019-12-01 LAB — BASIC METABOLIC PANEL
BUN: 18 (ref 4–21)
CO2: 27 — AB (ref 13–22)
Chloride: 99 (ref 99–108)
Creatinine: 1.1 (ref 0.5–1.1)
Glucose: 89
Potassium: 4.6 (ref 3.4–5.3)
Sodium: 135 — AB (ref 137–147)

## 2019-12-01 LAB — HEPATIC FUNCTION PANEL
ALT: 36 — AB (ref 7–35)
AST: 36 — AB (ref 13–35)
Alkaline Phosphatase: 80 (ref 25–125)
Bilirubin, Total: 0.4

## 2019-12-01 LAB — CBC AND DIFFERENTIAL
HCT: 42 (ref 36–46)
Hemoglobin: 12.8 (ref 12.0–16.0)
Platelets: 224 (ref 150–399)
WBC: 8.8

## 2019-12-01 LAB — CBC: RBC: 4.13 (ref 3.87–5.11)

## 2019-12-01 LAB — TSH: TSH: 2.67 (ref 0.41–5.90)

## 2019-12-06 DIAGNOSIS — R233 Spontaneous ecchymoses: Secondary | ICD-10-CM | POA: Diagnosis not present

## 2019-12-06 DIAGNOSIS — L821 Other seborrheic keratosis: Secondary | ICD-10-CM | POA: Diagnosis not present

## 2019-12-06 DIAGNOSIS — C44321 Squamous cell carcinoma of skin of nose: Secondary | ICD-10-CM | POA: Diagnosis not present

## 2019-12-06 DIAGNOSIS — D2239 Melanocytic nevi of other parts of face: Secondary | ICD-10-CM | POA: Diagnosis not present

## 2019-12-06 DIAGNOSIS — L309 Dermatitis, unspecified: Secondary | ICD-10-CM | POA: Diagnosis not present

## 2020-02-28 LAB — LIPID PANEL
Cholesterol: 166 (ref 0–200)
HDL: 81 — AB (ref 35–70)
LDL Cholesterol: 72
Triglycerides: 67 (ref 40–160)

## 2020-02-28 LAB — COMPREHENSIVE METABOLIC PANEL
Albumin: 4 (ref 3.5–5.0)
Calcium: 9.5 (ref 8.7–10.7)
GFR calc Af Amer: 59
GFR calc non Af Amer: 48
Globulin: 2.9

## 2020-02-28 LAB — VITAMIN D 25 HYDROXY (VIT D DEFICIENCY, FRACTURES): Vit D, 25-Hydroxy: 24.06

## 2020-02-28 LAB — HEPATIC FUNCTION PANEL
ALT: 23 (ref 7–35)
AST: 28 (ref 13–35)
Alkaline Phosphatase: 61 (ref 25–125)
Bilirubin, Total: 0.3

## 2020-02-28 LAB — CBC AND DIFFERENTIAL
HCT: 35 — AB (ref 36–46)
Hemoglobin: 11.5 — AB (ref 12.0–16.0)
Platelets: 231 (ref 150–399)
WBC: 6.5

## 2020-02-28 LAB — BASIC METABOLIC PANEL
BUN: 21 (ref 4–21)
CO2: 24 — AB (ref 13–22)
Chloride: 102 (ref 99–108)
Creatinine: 1.1 (ref 0.5–1.1)
Glucose: 92
Potassium: 4.6 (ref 3.4–5.3)
Sodium: 134 — AB (ref 137–147)

## 2020-02-28 LAB — CBC: RBC: 3.58 — AB (ref 3.87–5.11)

## 2020-06-05 DIAGNOSIS — R1011 Right upper quadrant pain: Secondary | ICD-10-CM | POA: Diagnosis not present

## 2020-06-05 DIAGNOSIS — R194 Change in bowel habit: Secondary | ICD-10-CM | POA: Diagnosis not present

## 2020-06-11 DIAGNOSIS — H52209 Unspecified astigmatism, unspecified eye: Secondary | ICD-10-CM | POA: Diagnosis not present

## 2020-06-11 DIAGNOSIS — H524 Presbyopia: Secondary | ICD-10-CM | POA: Diagnosis not present

## 2020-06-11 DIAGNOSIS — H269 Unspecified cataract: Secondary | ICD-10-CM | POA: Diagnosis not present

## 2020-06-11 DIAGNOSIS — H5203 Hypermetropia, bilateral: Secondary | ICD-10-CM | POA: Diagnosis not present

## 2020-06-19 DIAGNOSIS — H35371 Puckering of macula, right eye: Secondary | ICD-10-CM | POA: Diagnosis not present

## 2020-06-19 DIAGNOSIS — H33312 Horseshoe tear of retina without detachment, left eye: Secondary | ICD-10-CM | POA: Diagnosis not present

## 2020-06-19 DIAGNOSIS — H35362 Drusen (degenerative) of macula, left eye: Secondary | ICD-10-CM | POA: Diagnosis not present

## 2020-06-19 DIAGNOSIS — H33322 Round hole, left eye: Secondary | ICD-10-CM | POA: Diagnosis not present

## 2020-06-19 DIAGNOSIS — H43813 Vitreous degeneration, bilateral: Secondary | ICD-10-CM | POA: Diagnosis not present

## 2020-06-20 ENCOUNTER — Other Ambulatory Visit: Payer: Self-pay

## 2020-06-20 DIAGNOSIS — H33322 Round hole, left eye: Secondary | ICD-10-CM | POA: Diagnosis not present

## 2020-06-21 ENCOUNTER — Encounter: Payer: Self-pay | Admitting: Family Medicine

## 2020-06-21 ENCOUNTER — Other Ambulatory Visit: Payer: Self-pay

## 2020-06-21 ENCOUNTER — Ambulatory Visit (INDEPENDENT_AMBULATORY_CARE_PROVIDER_SITE_OTHER): Payer: Medicare HMO | Admitting: Family Medicine

## 2020-06-21 VITALS — BP 124/80 | HR 68 | Temp 98.0°F | Resp 16 | Ht 62.0 in | Wt 155.6 lb

## 2020-06-21 DIAGNOSIS — Z1231 Encounter for screening mammogram for malignant neoplasm of breast: Secondary | ICD-10-CM | POA: Diagnosis not present

## 2020-06-21 DIAGNOSIS — F411 Generalized anxiety disorder: Secondary | ICD-10-CM | POA: Diagnosis not present

## 2020-06-21 DIAGNOSIS — E7800 Pure hypercholesterolemia, unspecified: Secondary | ICD-10-CM

## 2020-06-21 DIAGNOSIS — Z79899 Other long term (current) drug therapy: Secondary | ICD-10-CM

## 2020-06-21 DIAGNOSIS — F330 Major depressive disorder, recurrent, mild: Secondary | ICD-10-CM | POA: Diagnosis not present

## 2020-06-21 DIAGNOSIS — E039 Hypothyroidism, unspecified: Secondary | ICD-10-CM | POA: Diagnosis not present

## 2020-06-21 MED ORDER — ALPRAZOLAM 2 MG PO TABS: ORAL_TABLET | ORAL | 0 refills | Status: DC

## 2020-06-21 MED ORDER — LAMOTRIGINE 25 MG PO TABS: ORAL_TABLET | ORAL | 0 refills | Status: DC

## 2020-06-21 NOTE — Progress Notes (Signed)
Office Note 07/02/2020  CC:  Chief Complaint  Patient presents with  . Establish Care    Previous seen at [place], [place]    HPI:  April Brewer is a 74 y.o. White female who is here accompanied by her niece to re-establish care. Patient's most recent primary MD: multiple local PCPs, see above. Old records in [facility] EMR were reviewed prior to or during today's visit.  I last saw her 03/18/17. Her primary issues while I was seeing her were: anx/dep, IBS, hypothyroidism, GERD, Insomnia, and chronic pain.  She currently lives wth her son, and her niece looks in on her frequently. Back here b/c when April Brewer was her PCP office she was moved around a lot and she liked it better with April Brewer. Dr. Nelva Bush follows her for pain control: no interventional procedures in a while, just managing her narcotics and muscle relaxers at this time.. Dr. Veverly Fells and Dr. Alvan Dame have been her ortho surgeons.  She has been taking alpraz rx'd by her PCP, takes variable amount, typically 1/2 bid.  Has still been very anxious a lot, scratches her head habitually.  Pt and niece convey that pt was discouraged from using alpraz at regular/approp doses--was dispensed #45 typically. PMP AWARE reviewed today: most recent rx for alpraz 39m was filled 06/17/20, # 460 rx by LLucky Cowboy MSN (NP). Most recent rx filled for oxycodone 125mfilled 06/05/20, #60, by CaLevy PupaNP with orthopedics. No red flags.  She cannot recall the last time she had labs, "probably about 6 mo ago".  No acute complaints except anxiety not well controlled.  Past Medical History:  Diagnosis Date  . ALLERGIC RHINITIS 06/02/2007  . Allergy   . Arthritis    DDD of cervical, thoracic, and lumbar spine  . Blood transfusion without reported diagnosis   . Cataract    Bilateral removed cateracts  . Cervical spondylosis    C5-6 and C6-7.  Pain mgmt as per Dr. RaNelva Bush . Chronic low back pain 02/18/2009   s/p fusion L3-4  through L5-S1.  Pain meds per Dr. RaNelva Bush. DEPRESSION 06/02/2007  . Diverticulosis   . GERD (gastroesophageal reflux disease)   . Heart murmur   . Hiatal hernia   . HYPOTHYROIDISM 06/02/2007   GOITER  . INSOMNIA 06/02/2007  . Lymphocytic colitis   . Muscle spasms of lower extremity   . Nasal septal perforation    chronic; hx of epistaxis  . Normocytic anemia 04/2015   HEME + in ED 08/07/15--endoscopic eval unrevealing.  Iron studies fine.  ?Anemia of chronic dz (lymphocytic colitis?)  . OSTEOPENIA 02/18/2009   Repeat DEXA 07/2014 showed osteoporosis by T score in radius but spine and hip T scores were normal-continue vit D and calcium and repeat DEXA 08/2016 unchanged.  Repeat DEXA 2 yrs.  . Peripheral edema    LE's, nonpitting    Past Surgical History:  Procedure Laterality Date  . ABDOMINAL HYSTERECTOMY     for DUB (ovaries are still in)  . BACK SURGERY     X 5: fusion of L3-L4 through L5-S1  . CATARACT EXTRACTION Bilateral   . CHOLECYSTECTOMY  2012  . COLONOSCOPY  06/2007; 01/2015   2008 Diverticulosis, o/w normal.  2016 showed lymphocytic colitis with mild diverticulosis: pt was started on oral budesonide at that time.  . CT SCAN  09/03/2017   ABDOMEN, HEAD  . DEXA  08/16/2014; 09/14/16   Hip and spine ok; radius osteoporotic---but meds not indicated.  Repeat 08/2018  . ESOPHAGOGASTRODUODENOSCOPY  10/2007; 11/27/15   Small hiatal hernia, o/w normal.  . HIP CLOSED REDUCTION Right 09/20/2018   Procedure: CLOSED REDUCTION HIP;  Surgeon: Paralee Cancel, MD;  Location: [facility];  Service: Orthopedics;  Laterality: Right;  . REVERSE SHOULDER ARTHROPLASTY Left 06/09/2019   Procedure: REVERSE SHOULDER ARTHROPLASTY;  Surgeon: Netta Cedars, MD;  Location: [facility];  Service: Orthopedics;  Laterality: Left;  . SHOULDER SURGERY Right    \  . TOTAL HIP ARTHROPLASTY  05/31/2012   Procedure: TOTAL HIP ARTHROPLASTY ANTERIOR APPROACH;  Surgeon: Mauri Pole, MD;  Location: [facility];  Service: Orthopedics;   Laterality: Left;  . TOTAL HIP ARTHROPLASTY Right 08/30/2018   Procedure: RIGHT TOTAL HIP ARTHROPLASTY ANTERIOR APPROACH;  Surgeon: Paralee Cancel, MD;  Location: [facility];  Service: Orthopedics;  Laterality: Right;  70 mins    Family History  Problem Relation Age of Onset  . Arthritis Mother   . Stroke Mother   . Breast cancer Mother   . Diabetes Mother   . Colon cancer Father   . Heart disease Father   . Heart disease Sister   . Breast cancer Sister   . Lung cancer Brother        smoked  . Uterine cancer Sister   . Esophageal cancer Neg Hx   . Rectal cancer Neg Hx   . Stomach cancer Neg Hx     Social History   Socioeconomic History  . Marital status: Married    Spouse name: Not on file  . Number of children: Not on file  . Years of education: Not on file  . Highest education level: Not on file  Occupational History  . Occupation: retired  Tobacco Use  . Smoking status: Never Smoker  . Smokeless tobacco: Never Used  Vaping Use  . Vaping Use: Never used  Substance and Sexual Activity  . Alcohol use: No    Alcohol/week: 0.0 standard drinks  . Drug use: No  . Sexual activity: Not on file  Other Topics Concern  . Not on file  Social History Narrative   Lives with her husband in [place], has two sons.   Born and raised [place].   Retired Programmer, multimedia related to back pain problems.   No T/A/Ds.         Social Determinants of Health   Financial Resource Strain:   . Difficulty of Paying Living Expenses:   Food Insecurity:   . Worried About Charity fundraiser in the Last Year:   . Arboriculturist in the Last Year:   Transportation Needs:   . Film/video editor (Medical):   Marland Kitchen Lack of Transportation (Non-Medical):   Physical Activity:   . Days of Exercise per Week:   . Minutes of Exercise per Session:   Stress:   . Feeling of Stress :   Social Connections:   . Frequency of Communication with Friends and Family:   . Frequency of Social  Gatherings with Friends and Family:   . Attends Religious Services:   . Active Member of Clubs or Organizations:   . Attends Archivist Meetings:   Marland Kitchen Marital Status:   Intimate Partner Violence:   . Fear of Current or Ex-Partner:   . Emotionally Abused:   Marland Kitchen Physically Abused:   . Sexually Abused:     Outpatient Encounter Medications as of 06/21/2020  Medication Sig  . ACETAMINOPHEN PO Take by mouth as needed.  Marland Kitchen  alprazolam (XANAX) 2 MG tablet 1 tab po bid  . ammonium lactate (LAC-HYDRIN) 12 % lotion   . atorvastatin (LIPITOR) 10 MG tablet Take 10 mg by mouth daily.   . famotidine (PEPCID) 20 MG tablet Take 20 mg by mouth 2 (two) times daily.  . furosemide (LASIX) 20 MG tablet Take 1 tablet (20 mg total) by mouth daily as needed for fluid or edema.  . hydrOXYzine (ATARAX/VISTARIL) 10 MG tablet Take 10 mg by mouth every 6 (six) hours as needed.   . methocarbamol (ROBAXIN) 750 MG tablet Take 750 mg by mouth every 8 (eight) hours as needed for muscle spasms.  . Oxycodone HCl 10 MG TABS   . polyethylene glycol (MIRALAX / GLYCOLAX) packet Take 17 g by mouth 2 (two) times daily.  . sertraline (ZOLOFT) 100 MG tablet TAKE ONE AND ONE-HALF  TABLET BY MOUTH NIGHTLY AT  BEDTIME (Patient taking differently: Take 100 mg by mouth 2 (two) times daily. )  . [DISCONTINUED] alprazolam (XANAX) 2 MG tablet Take one-half to one tablet by mouth twice daily as needed (Patient taking differently: Take 1-2 mg by mouth daily. )  . [DISCONTINUED] levothyroxine (SYNTHROID, LEVOTHROID) 75 MCG tablet TAKE 1 TABLET BY MOUTH ONCE DAILY WITH BREAKFAST (Patient taking differently: Take 75 mcg by mouth daily before breakfast. )  . lamoTRIgine (LAMICTAL) 25 MG tablet 1 tab po qd x 15d, then increase to 2 tabs po qd  . naloxone (NARCAN) 4 MG/0.1ML LIQD nasal spray kit Narcan 4 mg/actuation nasal spray  Take by nasal route every 3 minutes until patient awakes or EMS arrives. (Patient not taking: Reported on  06/21/2020)  . triamcinolone cream (KENALOG) 0.1 %  (Patient not taking: Reported on 06/21/2020)  . [DISCONTINUED] acetaminophen (TYLENOL) 500 MG tablet Take 2 tablets (1,000 mg total) by mouth every 8 (eight) hours. (Patient not taking: Reported on 06/21/2020)  . [DISCONTINUED] docusate sodium (COLACE) 100 MG capsule Take 1 capsule (100 mg total) by mouth 2 (two) times daily. (Patient not taking: Reported on 05/30/2019)  . [DISCONTINUED] ferrous sulfate (FERROUSUL) 325 (65 FE) MG tablet Take 1 tablet (325 mg total) by mouth 3 (three) times daily with meals. (Patient not taking: Reported on 05/30/2019)  . [DISCONTINUED] Iron-FA-B Cmp-C-Biot-Probiotic (FUSION PLUS) CAPS Take 1 capsule by mouth daily. (Patient not taking: Reported on 06/21/2020)  . [DISCONTINUED] methocarbamol (ROBAXIN) 500 MG tablet Take 1 tablet (500 mg total) by mouth every 6 (six) hours as needed for muscle spasms. (Patient not taking: Reported on 06/21/2020)  . [DISCONTINUED] omeprazole (PRILOSEC) 40 MG capsule TAKE 1 CAPSULE BY MOUTH  DAILY (Patient not taking: No sig reported)  . [DISCONTINUED] pantoprazole (PROTONIX) 40 MG tablet  (Patient not taking: Reported on 06/21/2020)  . [DISCONTINUED] sucralfate (CARAFATE) 1 g tablet  (Patient not taking: Reported on 06/21/2020)  . [DISCONTINUED] traMADol (ULTRAM) 50 MG tablet  (Patient not taking: Reported on 06/21/2020)  . [DISCONTINUED] traZODone (DESYREL) 50 MG tablet TAKE 4 TABLETS BY MOUTH  EVERY NIGHT AT BEDTIME AS  NEEDED FOR INSOMNIA (Patient not taking: Reported on 06/21/2020)  . [DISCONTINUED] triamterene-hydrochlorothiazide (MAXZIDE-25) 37.5-25 MG tablet Take 1 tablet by mouth daily as needed (swelling).  (Patient not taking: Reported on 06/21/2020)   No facility-administered encounter medications on file as of 06/21/2020.    Allergies  Allergen Reactions  . Latex Rash    ROS Review of Systems  Constitutional: Negative for fatigue and fever.  HENT: Negative for congestion and sore  throat.   Eyes: Negative for  visual disturbance.  Respiratory: Negative for cough.   Cardiovascular: Negative for chest pain.  Gastrointestinal: Negative for abdominal pain and nausea.  Genitourinary: Negative for dysuria.  Musculoskeletal: Positive for back pain (mid and low-chronic) and neck pain (chronic). Negative for joint swelling.  Skin: Negative for rash.  Neurological: Negative for weakness and headaches.  Hematological: Negative for adenopathy.  Psychiatric/Behavioral: The patient is nervous/anxious (chronic).     PE; Blood pressure 124/80, pulse 68, temperature 98 F (36.7 C), temperature source Temporal, resp. rate 16, height _0  (1.575 m), weight 155 lb 9.6 oz (70.6 kg), SpO2 98 %. Body mass index is 28.46 kg/m.  Gen: Alert, well appearing.  Patient is oriented to person, place, time, and situation. AFFECT: pleasant, lucid thought and speech. TML:YYTK: no injection, icteris, swelling, or exudate.  EOMI, PERRLA. Mouth: lips without lesion/swelling.  Oral mucosa pink and moist. Oropharynx without erythema, exudate, or swelling.  Neck - No masses or thyromegaly or limitation in range of motion CV: RRR, no m/r/g.   LUNGS: CTA bilat, nonlabored resps, good aeration in all lung fields. ABD: soft, NT, ND, BS normal.  No hepatospenomegaly or mass.  No bruits. EXT: no clubbing or cyanosis.  no edema.   Pertinent labs:  Lab Results  Component Value Date   TSH 8.37 (H) 06/21/2020   Lab Results  Component Value Date   WBC 7.4 06/21/2020   HGB 11.8 06/21/2020   HCT 35.0 06/21/2020   MCV 93.8 06/21/2020   PLT 223 06/21/2020   Lab Results  Component Value Date   VITAMINB12 317 05/23/2015    Lab Results  Component Value Date   IRON 107 08/12/2016   IRON 105 08/12/2016   TIBC 272 08/12/2016   FERRITIN 66.9 08/12/2016    Lab Results  Component Value Date   CREATININE 0.85 06/21/2020   BUN 15 06/21/2020   NA 133 (L) 06/21/2020   K 5.1 06/21/2020   CL 100  06/21/2020   CO2 27 06/21/2020   Lab Results  Component Value Date   ALT 18 06/21/2020   AST 22 06/21/2020   ALKPHOS 61 02/28/2020   BILITOT 0.3 06/21/2020   Lab Results  Component Value Date   CHOL 166 02/28/2020   Lab Results  Component Value Date   HDL 81 (A) 02/28/2020   Lab Results  Component Value Date   LDLCALC 72 02/28/2020   Lab Results  Component Value Date   TRIG 67 02/28/2020   Lab Results  Component Value Date   CHOLHDL 4 05/23/2015    ASSESSMENT AND PLAN:   New pt/re-establishing pt:  1) GAD with recurrent MDD--mainly anxiety not well controlled on current med regimen. Increase alpraz to 3m, 1 bid, #60--fill on/after 07/11/20.  No RF's on this. Also start lamictal 262m 1 qd x 15d, then increase to 2 qd. Continue sertraline 10014mid.  2) Hypothyroidism: TSH check today. CBC and CMET today as well.  3) Preventative health care: ordered screening mammogram for her today ([facility]).  4) Hyperlipidemia: tolerating atorva 51m34m. Not fasting today.  Repeat FLP at next fasting visit. Hepatic panel today.  Spent 35 min with pt today, with >50% of this time spent in counseling and care coordination regarding the above problems.  An After Visit Summary was printed and given to the patient.  Return in about 4 weeks (around 07/19/2020) for f/u anxiety.  Signed:  PhilCrissie Sickles           07/02/2020

## 2020-06-22 LAB — COMPREHENSIVE METABOLIC PANEL
AG Ratio: 1.4 (calc) (ref 1.0–2.5)
ALT: 18 U/L (ref 6–29)
AST: 22 U/L (ref 10–35)
Albumin: 4.2 g/dL (ref 3.6–5.1)
Alkaline phosphatase (APISO): 89 U/L (ref 37–153)
BUN: 15 mg/dL (ref 7–25)
CO2: 27 mmol/L (ref 20–32)
Calcium: 9.4 mg/dL (ref 8.6–10.4)
Chloride: 100 mmol/L (ref 98–110)
Creat: 0.85 mg/dL (ref 0.60–0.93)
Globulin: 2.9 g/dL (calc) (ref 1.9–3.7)
Glucose, Bld: 77 mg/dL (ref 65–99)
Potassium: 5.1 mmol/L (ref 3.5–5.3)
Sodium: 133 mmol/L — ABNORMAL LOW (ref 135–146)
Total Bilirubin: 0.3 mg/dL (ref 0.2–1.2)
Total Protein: 7.1 g/dL (ref 6.1–8.1)

## 2020-06-22 LAB — CBC WITH DIFFERENTIAL/PLATELET
Absolute Monocytes: 918 cells/uL (ref 200–950)
Basophils Absolute: 59 cells/uL (ref 0–200)
Basophils Relative: 0.8 %
Eosinophils Absolute: 311 cells/uL (ref 15–500)
Eosinophils Relative: 4.2 %
HCT: 35 % (ref 35.0–45.0)
Hemoglobin: 11.8 g/dL (ref 11.7–15.5)
Lymphs Abs: 2797 cells/uL (ref 850–3900)
MCH: 31.6 pg (ref 27.0–33.0)
MCHC: 33.7 g/dL (ref 32.0–36.0)
MCV: 93.8 fL (ref 80.0–100.0)
MPV: 10.7 fL (ref 7.5–12.5)
Monocytes Relative: 12.4 %
Neutro Abs: 3315 cells/uL (ref 1500–7800)
Neutrophils Relative %: 44.8 %
Platelets: 223 10*3/uL (ref 140–400)
RBC: 3.73 10*6/uL — ABNORMAL LOW (ref 3.80–5.10)
RDW: 12.6 % (ref 11.0–15.0)
Total Lymphocyte: 37.8 %
WBC: 7.4 10*3/uL (ref 3.8–10.8)

## 2020-06-22 LAB — TSH: TSH: 8.37 mIU/L — ABNORMAL HIGH (ref 0.40–4.50)

## 2020-06-26 ENCOUNTER — Telehealth: Payer: Self-pay

## 2020-06-26 ENCOUNTER — Other Ambulatory Visit: Payer: Self-pay

## 2020-06-26 ENCOUNTER — Encounter: Payer: Self-pay | Admitting: Family Medicine

## 2020-06-26 DIAGNOSIS — E039 Hypothyroidism, unspecified: Secondary | ICD-10-CM

## 2020-06-26 MED ORDER — LEVOTHYROXINE SODIUM 75 MCG PO TABS: ORAL_TABLET | ORAL | 1 refills | Status: DC

## 2020-06-26 NOTE — Telephone Encounter (Signed)
Received medical records from [facility] reviewed records and abstracted information into pts chart.   Records have been placed on Dr. Idelle Leech desk for review.

## 2020-06-27 DIAGNOSIS — G894 Chronic pain syndrome: Secondary | ICD-10-CM | POA: Diagnosis not present

## 2020-06-27 DIAGNOSIS — Z79899 Other long term (current) drug therapy: Secondary | ICD-10-CM | POA: Diagnosis not present

## 2020-06-27 DIAGNOSIS — Z5181 Encounter for therapeutic drug level monitoring: Secondary | ICD-10-CM | POA: Diagnosis not present

## 2020-07-02 DIAGNOSIS — Z1231 Encounter for screening mammogram for malignant neoplasm of breast: Secondary | ICD-10-CM | POA: Diagnosis not present

## 2020-07-02 DIAGNOSIS — M810 Age-related osteoporosis without current pathological fracture: Secondary | ICD-10-CM | POA: Diagnosis not present

## 2020-07-02 LAB — HM MAMMOGRAPHY

## 2020-07-03 ENCOUNTER — Encounter: Payer: Self-pay | Admitting: Family Medicine

## 2020-07-03 ENCOUNTER — Telehealth: Payer: Self-pay

## 2020-07-03 DIAGNOSIS — L570 Actinic keratosis: Secondary | ICD-10-CM | POA: Diagnosis not present

## 2020-07-03 DIAGNOSIS — C44311 Basal cell carcinoma of skin of nose: Secondary | ICD-10-CM | POA: Diagnosis not present

## 2020-07-03 DIAGNOSIS — D225 Melanocytic nevi of trunk: Secondary | ICD-10-CM | POA: Diagnosis not present

## 2020-07-03 DIAGNOSIS — L578 Other skin changes due to chronic exposure to nonionizing radiation: Secondary | ICD-10-CM | POA: Diagnosis not present

## 2020-07-03 DIAGNOSIS — D485 Neoplasm of uncertain behavior of skin: Secondary | ICD-10-CM | POA: Diagnosis not present

## 2020-07-03 NOTE — Telephone Encounter (Signed)
HM updated for mammogram

## 2020-07-11 DIAGNOSIS — H33312 Horseshoe tear of retina without detachment, left eye: Secondary | ICD-10-CM | POA: Diagnosis not present

## 2020-07-15 ENCOUNTER — Encounter: Payer: Self-pay | Admitting: Family Medicine

## 2020-07-15 NOTE — Telephone Encounter (Signed)
Reviewed. EMR updated. Signed:  Crissie Sickles, MD           07/15/2020

## 2020-07-16 ENCOUNTER — Other Ambulatory Visit: Payer: Self-pay | Admitting: Family Medicine

## 2020-07-24 ENCOUNTER — Ambulatory Visit (INDEPENDENT_AMBULATORY_CARE_PROVIDER_SITE_OTHER): Payer: Medicare HMO | Admitting: Family Medicine

## 2020-07-24 ENCOUNTER — Telehealth: Payer: Self-pay

## 2020-07-24 ENCOUNTER — Other Ambulatory Visit: Payer: Self-pay

## 2020-07-24 ENCOUNTER — Encounter: Payer: Self-pay | Admitting: Family Medicine

## 2020-07-24 VITALS — BP 123/82 | HR 83 | Temp 97.9°F | Resp 16 | Ht 62.0 in | Wt 153.6 lb

## 2020-07-24 DIAGNOSIS — E039 Hypothyroidism, unspecified: Secondary | ICD-10-CM

## 2020-07-24 DIAGNOSIS — F411 Generalized anxiety disorder: Secondary | ICD-10-CM | POA: Diagnosis not present

## 2020-07-24 DIAGNOSIS — Z8659 Personal history of other mental and behavioral disorders: Secondary | ICD-10-CM | POA: Diagnosis not present

## 2020-07-24 MED ORDER — LAMOTRIGINE 100 MG PO TABS: 100.0000 mg | ORAL_TABLET | Freq: Every day | ORAL | 1 refills | Status: DC

## 2020-07-24 NOTE — Telephone Encounter (Signed)
Sent as Richelle Ito with Marjory Lies at [facility] and able to clarify last refill done 07/15/20 #45 with last PCP. The refills you sent in starting 06/21/20 are on file and will be used starting with the next refill for #60 available no sooner than 8/15. No new Rx needed.

## 2020-07-24 NOTE — Telephone Encounter (Signed)
OK, pls let pt know that she can pick up my alpraz rx at her pharmacy on or after 08/11/20.

## 2020-07-24 NOTE — Progress Notes (Signed)
OFFICE VISIT  07/24/2020   CC:  Chief Complaint  Patient presents with  . Follow-up    anxiety   HPI:    Patient is a 74 y.o. Caucasian female who presents accompanied by a female companion for 4 wk f/u anxiety. A/P as of last visit: "1) GAD with recurrent MDD--mainly anxiety not well controlled on current med regimen. Increase alpraz to 61m, 1 bid, #60--fill on/after 07/11/20.  No RF's on this. Also start lamictal 274m 1 qd x 15d, then increase to 2 qd. Continue sertraline 10023mid.  2) Hypothyroidism: TSH check today. CBC and CMET today as well.  3) Preventative health care: ordered screening mammogram for her today ([facility]).  4) Hyperlipidemia: tolerating atorva 68m43m. Not fasting today.  Repeat FLP at next fasting visit. Hepatic panel today."  INTERIM HX: Has had a little less fidgety behavior over the last month. Hard to tell if anxiety/worry is any better. No signif sadness, hopelessness, or other signs of depression. Sleep is fine other than the last 1 wk after she fell and hit her hip. She has been taking the lamictal as rx'd, has been on 50 mg qd for 2 wks now but ran out 3 d/a.  No side effects.  PMP AWARE reviewed today: most recent rx for alprazolam 2mg 5m filled 07/14/20, #45 , rx by [facility] controlled substances filled with my name on them. No red flags.  Past Medical History:  Diagnosis Date  . ALLERGIC RHINITIS 06/02/2007  . Allergy   . Arthritis    DDD of cervical, thoracic, and lumbar spine  . Blood transfusion without reported diagnosis   . Cataract    Bilateral removed cateracts  . Cervical spondylosis    C5-6 and C6-7.  Pain mgmt as per Dr. RamosNelva BushChronic gastritis without bleeding   . Chronic low back pain 02/18/2009   s/p fusion L3-4 through L5-S1.  Pain meds per Dr. RamosNelva Bushhronic renal insufficiency, stage 3 (moderate)    borderline II/III (GFR about 55 ml/min) as of 2021  . DEPRESSION 06/02/2007  . Diverticulosis   .  GERD (gastroesophageal reflux disease)   . Heart murmur   . Hiatal hernia   . Hypercholesterolemia   . HYPOTHYROIDISM 06/02/2007   GOITER  . IBS (irritable bowel syndrome)   . INSOMNIA 06/02/2007  . Lymphocytic colitis   . Muscle spasms of lower extremity   . Nasal septal perforation    chronic; hx of epistaxis  . Normocytic anemia 04/2015   HEME + in ED 08/07/15--endoscopic eval unrevealing.  Iron studies fine-->?Anemia of chronic dz (lymphocytic colitis?).  Hb 02/2020 11.5.  . OSTEOPENIA 02/18/2009   Repeat DEXA 07/2014 showed osteoporosis by T score in radius but spine and hip T scores were normal-continue vit D and calcium and repeat DEXA 08/2016 unchanged.  Repeat DEXA 2 yrs.  . Peripheral edema    LE's, nonpitting  . Renal cyst, acquired, right    1.2 cm, 2021  . Simple hepatic cyst    x 1. 2021    Past Surgical History:  Procedure Laterality Date  . ABDOMINAL HYSTERECTOMY     for DUB (ovaries are still in)  . BACK SURGERY     X 5: fusion of L3-L4 through L5-S1  . CATARACT EXTRACTION Bilateral   . CHOLECYSTECTOMY  2012  . COLONOSCOPY  06/2007; 01/2015   2008 Diverticulosis, o/w normal.  2016 showed lymphocytic colitis with mild diverticulosis: pt was started on  oral budesonide at that time.  . CT SCAN  09/03/2017   ABDOMEN, HEAD  . DEXA  08/16/2014; 09/14/16   Hip and spine ok; radius osteoporotic---but meds not indicated.  Repeat 08/2018  . ESOPHAGOGASTRODUODENOSCOPY  10/2007; 11/27/15; 04/26/20   03/2020: chronic gastritis, H pylori neg, celiac neg.  +Barretts.  Hyperplastic gastric polyp.  Marland Kitchen HIP CLOSED REDUCTION Right 09/20/2018   Procedure: CLOSED REDUCTION HIP;  Surgeon: Paralee Cancel, MD;  Location: [facility];  Service: Orthopedics;  Laterality: Right;  . REVERSE SHOULDER ARTHROPLASTY Left 06/09/2019   Procedure: REVERSE SHOULDER ARTHROPLASTY;  Surgeon: Netta Cedars, MD;  Location: [facility];  Service: Orthopedics;  Laterality: Left;  . SHOULDER SURGERY Right    \  . TOTAL HIP  ARTHROPLASTY  05/31/2012   Procedure: TOTAL HIP ARTHROPLASTY ANTERIOR APPROACH;  Surgeon: Mauri Pole, MD;  Location: [facility];  Service: Orthopedics;  Laterality: Left;  . TOTAL HIP ARTHROPLASTY Right 08/30/2018   Procedure: RIGHT TOTAL HIP ARTHROPLASTY ANTERIOR APPROACH;  Surgeon: Paralee Cancel, MD;  Location: [facility];  Service: Orthopedics;  Laterality: Right;  70 mins    Outpatient Medications Prior to Visit  Medication Sig Dispense Refill  . alprazolam (XANAX) 2 MG tablet 1 tab po bid 60 tablet 0  . ammonium lactate (LAC-HYDRIN) 12 % lotion     . atorvastatin (LIPITOR) 10 MG tablet Take 10 mg by mouth daily.   1  . famotidine (PEPCID) 20 MG tablet Take 20 mg by mouth 2 (two) times daily.    . furosemide (LASIX) 20 MG tablet Take 1 tablet (20 mg total) by mouth daily as needed for fluid or edema. 30 tablet 0  . hydrOXYzine (ATARAX/VISTARIL) 10 MG tablet Take 10 mg by mouth every 6 (six) hours as needed.     Marland Kitchen levothyroxine (SYNTHROID) 75 MCG tablet Take 1.5 tablets by mouth on Mondays and Fridays and 1 tab all other days. 38 tablet 1  . methocarbamol (ROBAXIN) 750 MG tablet Take 750 mg by mouth every 8 (eight) hours as needed for muscle spasms.    . Oxycodone HCl 10 MG TABS     . polyethylene glycol (MIRALAX / GLYCOLAX) packet Take 17 g by mouth 2 (two) times daily. 14 each 0  . sertraline (ZOLOFT) 100 MG tablet TAKE ONE AND ONE-HALF  TABLET BY MOUTH NIGHTLY AT  BEDTIME (Patient taking differently: Take 100 mg by mouth 2 (two) times daily. ) 135 tablet 3  . triamcinolone cream (KENALOG) 0.1 %     . lamoTRIgine (LAMICTAL) 25 MG tablet TAKE 1 TABLET BY MOUTH EVERY DAY FOR 15 DAYS, THEN INCREASE TO 2 TABLETS DAILY 45 tablet 0  . ACETAMINOPHEN PO Take by mouth as needed. (Patient not taking: Reported on 07/24/2020)    . naloxone (NARCAN) 4 MG/0.1ML LIQD nasal spray kit Narcan 4 mg/actuation nasal spray  Take by nasal route every 3 minutes until patient awakes or EMS arrives. (Patient not taking:  Reported on 06/21/2020)     No facility-administered medications prior to visit.    Allergies  Allergen Reactions  . Latex Rash    ROS As per HPI  PE: Vitals with BMI 07/24/2020 06/21/2020 06/10/2019  Height 5' 2"  5' 2"  -  Weight 153 lbs 10 oz 155 lbs 10 oz -  BMI 90.24 09.73 -  Systolic 532 992 426  Diastolic 82 80 72  Pulse 83 68 92  O2 sat on RA today is 97%  Gen: Alert, well appearing.  Patient is oriented  to person, place, time, and situation. AFFECT: pleasant, lucid thought and speech. No further exam today.  LABS:  Lab Results  Component Value Date   TSH 8.37 (H) 06/21/2020   Lab Results  Component Value Date   WBC 7.4 06/21/2020   HGB 11.8 06/21/2020   HCT 35.0 06/21/2020   MCV 93.8 06/21/2020   PLT 223 06/21/2020   Lab Results  Component Value Date   CREATININE 0.85 06/21/2020   BUN 15 06/21/2020   NA 133 (L) 06/21/2020   K 5.1 06/21/2020   CL 100 06/21/2020   CO2 27 06/21/2020   Lab Results  Component Value Date   ALT 18 06/21/2020   AST 22 06/21/2020   ALKPHOS 61 02/28/2020   BILITOT 0.3 06/21/2020   Lab Results  Component Value Date   CHOL 166 02/28/2020   Lab Results  Component Value Date   HDL 81 (A) 02/28/2020   Lab Results  Component Value Date   LDLCALC 72 02/28/2020   Lab Results  Component Value Date   TRIG 67 02/28/2020   Lab Results  Component Value Date   CHOLHDL 4 05/23/2015    IMPRESSION AND PLAN:  1) GAD, hx of recurrent MDD: Possibly a mild amount of improvement since initiation of lamictal. Tolerating med w/out problem. Increase to 171m qd dosing and recheck in 1 mo. Continue sertaline 1039mbid. Continue alprz 1m26m1 bid: we'll check w/pharmacy and make sure they got my rx I sent for this med 07/02/20.  2) Hypothyroidism: 4 wks ago her TSH was mildly elevated so I adjusted her dose of T4 to 1 and 1/2 of the 44m77mabs mon and fri, 1 tab all other days. Recheck TSH 1 mo from now.  An After Visit Summary was  printed and given to the patient.  FOLLOW UP: Return in about 4 weeks (around 08/21/2020) for routine chronic illness f/u.  Signed:  PhilCrissie Sickles           07/24/2020

## 2020-07-25 DIAGNOSIS — Z96641 Presence of right artificial hip joint: Secondary | ICD-10-CM | POA: Diagnosis not present

## 2020-07-25 DIAGNOSIS — M25551 Pain in right hip: Secondary | ICD-10-CM | POA: Diagnosis not present

## 2020-07-25 DIAGNOSIS — M25552 Pain in left hip: Secondary | ICD-10-CM | POA: Diagnosis not present

## 2020-07-25 DIAGNOSIS — Z96642 Presence of left artificial hip joint: Secondary | ICD-10-CM | POA: Diagnosis not present

## 2020-07-25 NOTE — Telephone Encounter (Signed)
Patient was notified.

## 2020-08-15 ENCOUNTER — Other Ambulatory Visit: Payer: Self-pay | Admitting: Family Medicine

## 2020-08-22 DIAGNOSIS — M79604 Pain in right leg: Secondary | ICD-10-CM | POA: Diagnosis not present

## 2020-08-22 DIAGNOSIS — M25561 Pain in right knee: Secondary | ICD-10-CM | POA: Diagnosis not present

## 2020-08-22 DIAGNOSIS — M25531 Pain in right wrist: Secondary | ICD-10-CM | POA: Diagnosis not present

## 2020-08-26 ENCOUNTER — Telehealth: Payer: Self-pay | Admitting: Family Medicine

## 2020-08-26 ENCOUNTER — Other Ambulatory Visit: Payer: Self-pay

## 2020-08-26 ENCOUNTER — Ambulatory Visit (INDEPENDENT_AMBULATORY_CARE_PROVIDER_SITE_OTHER): Payer: Medicare HMO | Admitting: Family Medicine

## 2020-08-26 DIAGNOSIS — E039 Hypothyroidism, unspecified: Secondary | ICD-10-CM

## 2020-08-26 LAB — TSH: TSH: 1.86 u[IU]/mL (ref 0.35–4.50)

## 2020-08-26 NOTE — Telephone Encounter (Signed)
Noted  

## 2020-08-26 NOTE — Telephone Encounter (Signed)
OK to stop lamotrigine.

## 2020-08-26 NOTE — Telephone Encounter (Signed)
Patient presented to office today for lab draw.  Patient was actually supposed to be scheduled for 4 week f/u.  I did schedule her for 09/04/20.    Patient states that she can not take lamotrigine 100mg .  She states it makes her " swimmy headed and dizzy"  She also states she has fallen because of medication.  She was seen by ortho for fall and she is fine.    FYI

## 2020-08-27 ENCOUNTER — Other Ambulatory Visit: Payer: Self-pay

## 2020-08-27 DIAGNOSIS — E039 Hypothyroidism, unspecified: Secondary | ICD-10-CM

## 2020-08-27 MED ORDER — LEVOTHYROXINE SODIUM 75 MCG PO TABS: ORAL_TABLET | ORAL | 3 refills | Status: AC

## 2020-09-04 ENCOUNTER — Ambulatory Visit (INDEPENDENT_AMBULATORY_CARE_PROVIDER_SITE_OTHER): Payer: Medicare HMO | Admitting: Family Medicine

## 2020-09-04 ENCOUNTER — Encounter: Payer: Self-pay | Admitting: Family Medicine

## 2020-09-04 ENCOUNTER — Other Ambulatory Visit: Payer: Self-pay

## 2020-09-04 VITALS — BP 123/80 | HR 78 | Temp 98.3°F | Resp 16 | Ht 62.0 in | Wt 152.6 lb

## 2020-09-04 DIAGNOSIS — F411 Generalized anxiety disorder: Secondary | ICD-10-CM | POA: Diagnosis not present

## 2020-09-04 DIAGNOSIS — F330 Major depressive disorder, recurrent, mild: Secondary | ICD-10-CM | POA: Diagnosis not present

## 2020-09-04 MED ORDER — LAMOTRIGINE 25 MG PO TABS: ORAL_TABLET | ORAL | 1 refills | Status: DC

## 2020-09-04 MED ORDER — SERTRALINE HCL 100 MG PO TABS: 100.0000 mg | ORAL_TABLET | Freq: Two times a day (BID) | ORAL | 3 refills | Status: AC

## 2020-09-04 NOTE — Progress Notes (Signed)
OFFICE VISIT  09/04/2020  CC:  Chief Complaint  Patient presents with   Follow-up    RCI, pt is not fasting    HPI:    Patient is a 74 y.o. Caucasian female who presents accompanied by a female companion for 5 wk f/u GAD, hx of recurrent MDD. A/P as of last visit: "1) GAD, hx of recurrent MDD: Possibly a mild amount of improvement since initiation of lamictal. Tolerating med w/out problem. Increase to 197m qd dosing and recheck in 1 mo. Continue sertaline 1053mbid. Continue alprz 35m46m1 bid: we'll check w/pharmacy and make sure they got my rx I sent for this med 07/02/20.  2) Hypothyroidism: 4 wks ago her TSH was mildly elevated so I adjusted her dose of T4 to 1 and 1/2 of the 77m76mabs mon and fri, 1 tab all other days. Recheck TSH 1 mo from now."  Interim hx: Says she had some dizziness on the 100mg65mictal dose, fell x 2 and hit wrist and knee. She stopped the med after about 2 weeks and has not had any further dizziness or falls.   She did tolerate the 50mg 40m fine and felt mild improvement when I saw her for f/u 5 wks ago.   TSH 08/27/20 was wnl so dose was kept the same.  Past Medical History:  Diagnosis Date   ALLERGIC RHINITIS 06/02/2007   Allergy    Arthritis    DDD of cervical, thoracic, and lumbar spine   Blood transfusion without reported diagnosis    Cataract    Bilateral removed cateracts   Cervical spondylosis    C5-6 and C6-7.  Pain mgmt as per Dr. Ramos.Nelva Bushronic gastritis without bleeding    Chronic low back pain 02/18/2009   s/p fusion L3-4 through L5-S1.  Pain meds per Dr. Ramos Nelva Bushonic renal insufficiency, stage 3 (moderate)    borderline II/III (GFR about 55 ml/min) as of 2021   DEPRESSION 06/02/2007   Diverticulosis    GERD (gastroesophageal reflux disease)    Heart murmur    Hiatal hernia    Hypercholesterolemia    HYPOTHYROIDISM 06/02/2007   GOITER   IBS (irritable bowel syndrome)    INSOMNIA 06/02/2007   Lymphocytic  colitis    Muscle spasms of lower extremity    Nasal septal perforation    chronic; hx of epistaxis   Normocytic anemia 04/2015   HEME + in ED 08/07/15--endoscopic eval unrevealing.  Iron studies fine-->?Anemia of chronic dz (lymphocytic colitis?).  Hb 02/2020 11.5.   OSTEOPENIA 02/18/2009   Repeat DEXA 07/2014 showed osteoporosis by T score in radius but spine and hip T scores were normal-continue vit D and calcium and repeat DEXA 08/2016 unchanged.  Repeat DEXA 2 yrs.   Peripheral edema    LE's, nonpitting   Renal cyst, acquired, right    1.2 cm, 2021   Simple hepatic cyst    x 1. 2021    Past Surgical History:  Procedure Laterality Date   ABDOMINAL HYSTERECTOMY     for DUB (ovaries are still in)   BACK SURGERY     X 5: fusion of L3-L4 through L5-S1   CATARACT EXTRACTION Bilateral    CHOLECYSTECTOMY  2012   COLONOSCOPY  06/2007; 01/2015   2008 Diverticulosis, o/w normal.  2016 showed lymphocytic colitis with mild diverticulosis: pt was started on oral budesonide at that time.   CT SCAN  09/03/2017   ABDOMEN, HEAD   DEXA  08/16/2014;  09/14/16   Hip and spine ok; radius osteoporotic---but meds not indicated.  Repeat 08/2018   ESOPHAGOGASTRODUODENOSCOPY  10/2007; 11/27/15; 04/26/20   03/2020: chronic gastritis, H pylori neg, celiac neg.  +Barretts.  Hyperplastic gastric polyp.   HIP CLOSED REDUCTION Right 09/20/2018   Procedure: CLOSED REDUCTION HIP;  Surgeon: Paralee Cancel, MD;  Location: [facility];  Service: Orthopedics;  Laterality: Right;   REVERSE SHOULDER ARTHROPLASTY Left 06/09/2019   Procedure: REVERSE SHOULDER ARTHROPLASTY;  Surgeon: Netta Cedars, MD;  Location: [facility];  Service: Orthopedics;  Laterality: Left;   SHOULDER SURGERY Right    \   TOTAL HIP ARTHROPLASTY  05/31/2012   Procedure: TOTAL HIP ARTHROPLASTY ANTERIOR APPROACH;  Surgeon: Mauri Pole, MD;  Location: [facility];  Service: Orthopedics;  Laterality: Left;   TOTAL HIP ARTHROPLASTY Right 08/30/2018    Procedure: RIGHT TOTAL HIP ARTHROPLASTY ANTERIOR APPROACH;  Surgeon: Paralee Cancel, MD;  Location: [facility];  Service: Orthopedics;  Laterality: Right;  70 mins    Outpatient Medications Prior to Visit  Medication Sig Dispense Refill   alprazolam (XANAX) 2 MG tablet 1 tab po bid 60 tablet 0   ammonium lactate (LAC-HYDRIN) 12 % lotion      atorvastatin (LIPITOR) 10 MG tablet Take 10 mg by mouth daily.   1   furosemide (LASIX) 20 MG tablet Take 1 tablet (20 mg total) by mouth daily as needed for fluid or edema. 30 tablet 0   levothyroxine (SYNTHROID) 75 MCG tablet Take 1.5 tablets by mouth on Mondays and Fridays and 1 tab all other days. 114 tablet 3   methocarbamol (ROBAXIN) 750 MG tablet Take 750 mg by mouth every 8 (eight) hours as needed for muscle spasms.     Oxycodone HCl 10 MG TABS      triamcinolone cream (KENALOG) 0.1 %      Wheat Dextrin (BENEFIBER PO) Take by mouth daily.     sertraline (ZOLOFT) 100 MG tablet TAKE ONE AND ONE-HALF  TABLET BY MOUTH NIGHTLY AT  BEDTIME (Patient taking differently: Take 100 mg by mouth 2 (two) times daily. ) 135 tablet 3   ACETAMINOPHEN PO Take by mouth as needed. (Patient not taking: Reported on 07/24/2020)     naloxone (NARCAN) 4 MG/0.1ML LIQD nasal spray kit Narcan 4 mg/actuation nasal spray  Take by nasal route every 3 minutes until patient awakes or EMS arrives. (Patient not taking: Reported on 06/21/2020)     polyethylene glycol (MIRALAX / GLYCOLAX) packet Take 17 g by mouth 2 (two) times daily. (Patient not taking: Reported on 09/04/2020) 14 each 0   famotidine (PEPCID) 20 MG tablet Take 20 mg by mouth 2 (two) times daily. (Patient not taking: Reported on 09/04/2020)     hydrOXYzine (ATARAX/VISTARIL) 10 MG tablet Take 10 mg by mouth every 6 (six) hours as needed.  (Patient not taking: Reported on 09/04/2020)     lamoTRIgine (LAMICTAL) 100 MG tablet TAKE 1 TABLET BY MOUTH EVERY DAY (Patient not taking: Reported on 09/04/2020) 30 tablet 1   No  facility-administered medications prior to visit.    Allergies  Allergen Reactions   Latex Rash    ROS As per HPI  PE: Vitals with BMI 09/04/2020 07/24/2020 06/21/2020  Height 5' 2"  5' 2"  5' 2"   Weight 152 lbs 10 oz 153 lbs 10 oz 155 lbs 10 oz  BMI 27.9 63.01 60.10  Systolic 932 355 732  Diastolic 80 82 80  Pulse 78 83 68     Gen: Alert,  well appearing.  Patient is oriented to person, place, time, and situation. AFFECT: pleasant, lucid thought and speech. No further exam today.  LABS:    Chemistry      Component Value Date/Time   NA 133 (L) 06/21/2020 1458   NA 134 (A) 02/28/2020 0000   K 5.1 06/21/2020 1458   CL 100 06/21/2020 1458   CO2 27 06/21/2020 1458   BUN 15 06/21/2020 1458   BUN 21 02/28/2020 0000   CREATININE 0.85 06/21/2020 1458   GLU 92 02/28/2020 0000      Component Value Date/Time   CALCIUM 9.4 06/21/2020 1458   ALKPHOS 61 02/28/2020 0000   AST 22 06/21/2020 1458   ALT 18 06/21/2020 1458   BILITOT 0.3 06/21/2020 1458     Lab Results  Component Value Date   TSH 1.86 08/26/2020   Lab Results  Component Value Date   WBC 7.4 06/21/2020   HGB 11.8 06/21/2020   HCT 35.0 06/21/2020   MCV 93.8 06/21/2020   PLT 223 06/21/2020   Lab Results  Component Value Date   CHOL 166 02/28/2020   HDL 81 (A) 02/28/2020   LDLCALC 72 02/28/2020   LDLDIRECT 133.3 12/16/2011   TRIG 67 02/28/2020   CHOLHDL 4 05/23/2015     IMPRESSION AND PLAN:  1) GAD with recurrent MDD mild episode. Cut back to 75m lamictal qd.  Continue sertraline 100 mg bid and xanax 274mbid.  2) Hypothyroidism: she is therapeutic now. Plan recheck TSH 6 mo.  An After Visit Summary was printed and given to the patient.  FOLLOW UP: Return in about 6 weeks (around 10/16/2020) for f/u anx/dep.  Signed:  PhCrissie SicklesMD           09/04/2020

## 2020-09-04 NOTE — Patient Instructions (Signed)
The only change we made today is to restart lamotrigine at the 50mg  dose once daily (this will be TWO of the 25mg  tabs at a time).

## 2020-09-06 ENCOUNTER — Other Ambulatory Visit: Payer: Self-pay | Admitting: Family Medicine

## 2020-09-06 NOTE — Telephone Encounter (Signed)
Requesting: alprazolam Contract: n/a UDS: n/a  Last Visit:09/04/20 Next Visit:10/17/20 Last Refill:08/11/20 #60 with 0 refills  Please Advise. Medication pending, verified with pharmacy no refills on file.

## 2020-09-09 ENCOUNTER — Telehealth: Payer: Self-pay

## 2020-09-09 NOTE — Telephone Encounter (Signed)
Symptomatic care: tylenol [phone] mg every 6 hours for fever or body aches, robitussin DM over the counter (generic) every 6 hours as needed for cough.  Drink lots of clear fluids and rest.

## 2020-09-09 NOTE — Telephone Encounter (Signed)
Patient seen last week  Reports she is having flu like symptoms. Congestion, cough, achy, no fever.  Patient wants to know what to do or take.  I told her she would most likely need office visit to be treated and she declined.  Stated she was just in the office last week.  Please call and advise. [phone]

## 2020-09-09 NOTE — Telephone Encounter (Signed)
Please advise, thanks. Pt offered virtual appt, declined

## 2020-09-10 NOTE — Telephone Encounter (Signed)
Patient advised and voiced understanding.  

## 2020-09-13 ENCOUNTER — Encounter (HOSPITAL_BASED_OUTPATIENT_CLINIC_OR_DEPARTMENT_OTHER): Payer: Self-pay | Admitting: Emergency Medicine

## 2020-09-13 ENCOUNTER — Inpatient Hospital Stay (HOSPITAL_BASED_OUTPATIENT_CLINIC_OR_DEPARTMENT_OTHER)
Admission: EM | Admit: 2020-09-13 | Discharge: 2020-10-28 | DRG: 177 | Disposition: E | Payer: Medicare HMO | Attending: Internal Medicine | Admitting: Internal Medicine

## 2020-09-13 ENCOUNTER — Emergency Department (HOSPITAL_BASED_OUTPATIENT_CLINIC_OR_DEPARTMENT_OTHER): Payer: Medicare HMO

## 2020-09-13 ENCOUNTER — Other Ambulatory Visit: Payer: Self-pay

## 2020-09-13 DIAGNOSIS — Z8261 Family history of arthritis: Secondary | ICD-10-CM

## 2020-09-13 DIAGNOSIS — K219 Gastro-esophageal reflux disease without esophagitis: Secondary | ICD-10-CM | POA: Diagnosis present

## 2020-09-13 DIAGNOSIS — M549 Dorsalgia, unspecified: Secondary | ICD-10-CM | POA: Diagnosis present

## 2020-09-13 DIAGNOSIS — E785 Hyperlipidemia, unspecified: Secondary | ICD-10-CM | POA: Diagnosis present

## 2020-09-13 DIAGNOSIS — R509 Fever, unspecified: Secondary | ICD-10-CM | POA: Diagnosis not present

## 2020-09-13 DIAGNOSIS — J690 Pneumonitis due to inhalation of food and vomit: Secondary | ICD-10-CM | POA: Diagnosis present

## 2020-09-13 DIAGNOSIS — M810 Age-related osteoporosis without current pathological fracture: Secondary | ICD-10-CM | POA: Diagnosis present

## 2020-09-13 DIAGNOSIS — R918 Other nonspecific abnormal finding of lung field: Secondary | ICD-10-CM | POA: Diagnosis not present

## 2020-09-13 DIAGNOSIS — Z9049 Acquired absence of other specified parts of digestive tract: Secondary | ICD-10-CM

## 2020-09-13 DIAGNOSIS — I63212 Cerebral infarction due to unspecified occlusion or stenosis of left vertebral arteries: Secondary | ICD-10-CM | POA: Diagnosis not present

## 2020-09-13 DIAGNOSIS — Z515 Encounter for palliative care: Secondary | ICD-10-CM | POA: Diagnosis not present

## 2020-09-13 DIAGNOSIS — F419 Anxiety disorder, unspecified: Secondary | ICD-10-CM | POA: Diagnosis present

## 2020-09-13 DIAGNOSIS — R0902 Hypoxemia: Secondary | ICD-10-CM

## 2020-09-13 DIAGNOSIS — Z90710 Acquired absence of both cervix and uterus: Secondary | ICD-10-CM | POA: Diagnosis not present

## 2020-09-13 DIAGNOSIS — D638 Anemia in other chronic diseases classified elsewhere: Secondary | ICD-10-CM | POA: Diagnosis present

## 2020-09-13 DIAGNOSIS — E871 Hypo-osmolality and hyponatremia: Secondary | ICD-10-CM | POA: Diagnosis present

## 2020-09-13 DIAGNOSIS — G936 Cerebral edema: Secondary | ICD-10-CM | POA: Diagnosis not present

## 2020-09-13 DIAGNOSIS — J1282 Pneumonia due to coronavirus disease 2019: Secondary | ICD-10-CM | POA: Diagnosis present

## 2020-09-13 DIAGNOSIS — D649 Anemia, unspecified: Secondary | ICD-10-CM | POA: Diagnosis not present

## 2020-09-13 DIAGNOSIS — I129 Hypertensive chronic kidney disease with stage 1 through stage 4 chronic kidney disease, or unspecified chronic kidney disease: Secondary | ICD-10-CM | POA: Diagnosis present

## 2020-09-13 DIAGNOSIS — I639 Cerebral infarction, unspecified: Secondary | ICD-10-CM | POA: Diagnosis not present

## 2020-09-13 DIAGNOSIS — I340 Nonrheumatic mitral (valve) insufficiency: Secondary | ICD-10-CM | POA: Diagnosis not present

## 2020-09-13 DIAGNOSIS — R1310 Dysphagia, unspecified: Secondary | ICD-10-CM

## 2020-09-13 DIAGNOSIS — E870 Hyperosmolality and hypernatremia: Secondary | ICD-10-CM | POA: Diagnosis not present

## 2020-09-13 DIAGNOSIS — N183 Chronic kidney disease, stage 3 unspecified: Secondary | ICD-10-CM | POA: Diagnosis present

## 2020-09-13 DIAGNOSIS — E039 Hypothyroidism, unspecified: Secondary | ICD-10-CM | POA: Diagnosis present

## 2020-09-13 DIAGNOSIS — I6340 Cerebral infarction due to embolism of unspecified cerebral artery: Secondary | ICD-10-CM | POA: Diagnosis not present

## 2020-09-13 DIAGNOSIS — R0603 Acute respiratory distress: Secondary | ICD-10-CM

## 2020-09-13 DIAGNOSIS — K7689 Other specified diseases of liver: Secondary | ICD-10-CM | POA: Diagnosis not present

## 2020-09-13 DIAGNOSIS — Z8249 Family history of ischemic heart disease and other diseases of the circulatory system: Secondary | ICD-10-CM

## 2020-09-13 DIAGNOSIS — I361 Nonrheumatic tricuspid (valve) insufficiency: Secondary | ICD-10-CM | POA: Diagnosis not present

## 2020-09-13 DIAGNOSIS — K2950 Unspecified chronic gastritis without bleeding: Secondary | ICD-10-CM | POA: Diagnosis present

## 2020-09-13 DIAGNOSIS — D6859 Other primary thrombophilia: Secondary | ICD-10-CM | POA: Diagnosis present

## 2020-09-13 DIAGNOSIS — R0602 Shortness of breath: Secondary | ICD-10-CM | POA: Diagnosis present

## 2020-09-13 DIAGNOSIS — R05 Cough: Secondary | ICD-10-CM | POA: Diagnosis not present

## 2020-09-13 DIAGNOSIS — J309 Allergic rhinitis, unspecified: Secondary | ICD-10-CM | POA: Diagnosis present

## 2020-09-13 DIAGNOSIS — E876 Hypokalemia: Secondary | ICD-10-CM | POA: Diagnosis present

## 2020-09-13 DIAGNOSIS — U071 COVID-19: Principal | ICD-10-CM

## 2020-09-13 DIAGNOSIS — I6503 Occlusion and stenosis of bilateral vertebral arteries: Secondary | ICD-10-CM | POA: Diagnosis not present

## 2020-09-13 DIAGNOSIS — Z91040 Latex allergy status: Secondary | ICD-10-CM

## 2020-09-13 DIAGNOSIS — J3489 Other specified disorders of nose and nasal sinuses: Secondary | ICD-10-CM | POA: Diagnosis not present

## 2020-09-13 DIAGNOSIS — M1990 Unspecified osteoarthritis, unspecified site: Secondary | ICD-10-CM | POA: Diagnosis present

## 2020-09-13 DIAGNOSIS — J9601 Acute respiratory failure with hypoxia: Secondary | ICD-10-CM | POA: Diagnosis present

## 2020-09-13 DIAGNOSIS — Z66 Do not resuscitate: Secondary | ICD-10-CM | POA: Diagnosis not present

## 2020-09-13 DIAGNOSIS — Z79899 Other long term (current) drug therapy: Secondary | ICD-10-CM

## 2020-09-13 DIAGNOSIS — Z823 Family history of stroke: Secondary | ICD-10-CM

## 2020-09-13 DIAGNOSIS — Z4682 Encounter for fitting and adjustment of non-vascular catheter: Secondary | ICD-10-CM | POA: Diagnosis not present

## 2020-09-13 DIAGNOSIS — I6330 Cerebral infarction due to thrombosis of unspecified cerebral artery: Secondary | ICD-10-CM | POA: Insufficient documentation

## 2020-09-13 DIAGNOSIS — Z781 Physical restraint status: Secondary | ICD-10-CM

## 2020-09-13 DIAGNOSIS — R1312 Dysphagia, oropharyngeal phase: Secondary | ICD-10-CM | POA: Diagnosis not present

## 2020-09-13 DIAGNOSIS — I6389 Other cerebral infarction: Secondary | ICD-10-CM | POA: Diagnosis not present

## 2020-09-13 DIAGNOSIS — R410 Disorientation, unspecified: Secondary | ICD-10-CM | POA: Diagnosis not present

## 2020-09-13 DIAGNOSIS — Z79890 Hormone replacement therapy (postmenopausal): Secondary | ICD-10-CM

## 2020-09-13 DIAGNOSIS — I071 Rheumatic tricuspid insufficiency: Secondary | ICD-10-CM | POA: Diagnosis present

## 2020-09-13 DIAGNOSIS — I517 Cardiomegaly: Secondary | ICD-10-CM | POA: Diagnosis not present

## 2020-09-13 DIAGNOSIS — F329 Major depressive disorder, single episode, unspecified: Secondary | ICD-10-CM | POA: Diagnosis present

## 2020-09-13 DIAGNOSIS — R7401 Elevation of levels of liver transaminase levels: Secondary | ICD-10-CM | POA: Diagnosis present

## 2020-09-13 DIAGNOSIS — G8929 Other chronic pain: Secondary | ICD-10-CM | POA: Diagnosis present

## 2020-09-13 DIAGNOSIS — Z79891 Long term (current) use of opiate analgesic: Secondary | ICD-10-CM

## 2020-09-13 DIAGNOSIS — Z96643 Presence of artificial hip joint, bilateral: Secondary | ICD-10-CM | POA: Diagnosis present

## 2020-09-13 DIAGNOSIS — Z96612 Presence of left artificial shoulder joint: Secondary | ICD-10-CM | POA: Diagnosis present

## 2020-09-13 DIAGNOSIS — I6622 Occlusion and stenosis of left posterior cerebral artery: Secondary | ICD-10-CM | POA: Diagnosis not present

## 2020-09-13 LAB — CBC WITH DIFFERENTIAL/PLATELET
Abs Immature Granulocytes: 0.04 10*3/uL (ref 0.00–0.07)
Basophils Absolute: 0 10*3/uL (ref 0.0–0.1)
Basophils Relative: 0 %
Eosinophils Absolute: 0 10*3/uL (ref 0.0–0.5)
Eosinophils Relative: 0 %
HCT: 32.1 % — ABNORMAL LOW (ref 36.0–46.0)
Hemoglobin: 11.1 g/dL — ABNORMAL LOW (ref 12.0–15.0)
Immature Granulocytes: 1 %
Lymphocytes Relative: 15 %
Lymphs Abs: 0.8 10*3/uL (ref 0.7–4.0)
MCH: 30.7 pg (ref 26.0–34.0)
MCHC: 34.6 g/dL (ref 30.0–36.0)
MCV: 88.9 fL (ref 80.0–100.0)
Monocytes Absolute: 0.4 10*3/uL (ref 0.1–1.0)
Monocytes Relative: 7 %
Neutro Abs: 4.1 10*3/uL (ref 1.7–7.7)
Neutrophils Relative %: 77 %
Platelets: 247 10*3/uL (ref 150–400)
RBC: 3.61 MIL/uL — ABNORMAL LOW (ref 3.87–5.11)
RDW: 14.2 % (ref 11.5–15.5)
WBC: 5.3 10*3/uL (ref 4.0–10.5)
nRBC: 0 % (ref 0.0–0.2)

## 2020-09-13 LAB — COMPREHENSIVE METABOLIC PANEL
ALT: 27 U/L (ref 0–44)
AST: 64 U/L — ABNORMAL HIGH (ref 15–41)
Albumin: 3.2 g/dL — ABNORMAL LOW (ref 3.5–5.0)
Alkaline Phosphatase: 107 U/L (ref 38–126)
Anion gap: 11 (ref 5–15)
BUN: 11 mg/dL (ref 8–23)
CO2: 23 mmol/L (ref 22–32)
Calcium: 8.2 mg/dL — ABNORMAL LOW (ref 8.9–10.3)
Chloride: 92 mmol/L — ABNORMAL LOW (ref 98–111)
Creatinine, Ser: 0.7 mg/dL (ref 0.44–1.00)
GFR calc Af Amer: >60 mL/min (ref >60)
GFR calc non Af Amer: >60 mL/min (ref >60)
Glucose, Bld: 124 mg/dL — ABNORMAL HIGH (ref 70–99)
Potassium: 3.4 mmol/L — ABNORMAL LOW (ref 3.5–5.1)
Sodium: 126 mmol/L — ABNORMAL LOW (ref 135–145)
Total Bilirubin: 0.4 mg/dL (ref 0.3–1.2)
Total Protein: 6.5 g/dL (ref 6.5–8.1)

## 2020-09-13 LAB — SARS CORONAVIRUS 2 BY RT PCR (HOSPITAL ORDER, PERFORMED IN ~~LOC~~ HOSPITAL LAB): SARS Coronavirus 2: POSITIVE — AB

## 2020-09-13 LAB — D-DIMER, QUANTITATIVE: D-Dimer, Quant: 1.04 ug/mL-FEU — ABNORMAL HIGH (ref 0.00–0.50)

## 2020-09-13 LAB — LACTIC ACID, PLASMA: Lactic Acid, Venous: 1.4 mmol/L (ref 0.5–1.9)

## 2020-09-13 MED ORDER — SODIUM CHLORIDE 0.9 % IV SOLN
100.0000 mg | Freq: Every day | INTRAVENOUS | Status: AC
  Administered 2020-09-14 – 2020-09-17 (×4): 100 mg via INTRAVENOUS
  Filled 2020-09-13 (×5): qty 20

## 2020-09-13 MED ORDER — SODIUM CHLORIDE 0.9 % IV SOLN
100.0000 mg | INTRAVENOUS | Status: AC
  Administered 2020-09-13 (×2): 100 mg via INTRAVENOUS
  Filled 2020-09-13: qty 20

## 2020-09-13 MED ORDER — DEXAMETHASONE SODIUM PHOSPHATE 10 MG/ML IJ SOLN
10.0000 mg | Freq: Once | INTRAMUSCULAR | Status: AC
  Administered 2020-09-13: 10 mg via INTRAVENOUS
  Filled 2020-09-13: qty 1

## 2020-09-13 NOTE — ED Triage Notes (Signed)
Pt's son states pt has been sick this week  Pt has no taste or smell and has had congestion and cough with fever  Sxs started earlier this week  Pt was getting ready for bed tonight and became pale and her lips were blue

## 2020-09-13 NOTE — ED Notes (Signed)
Blood culture x1 obtained at this time. Unable to draw from 2nd IV and unable to get blood on additional phlebotomy stick.

## 2020-09-13 NOTE — ED Provider Notes (Signed)
[place] EMERGENCY DEPARTMENT Provider Note  CSN: [id number] Arrival date & time: 08/31/2020 2127    History Chief Complaint  Patient presents with  . Shortness of Breath    HPI  April Brewer is a 74 y.o. female reports about a week of cough, congestion, fevers, SOB and lost of taste/smell. She has had weakness and lightheadedness. Has not been vaccinated for Covid.    Past Medical History:  Diagnosis Date  . ALLERGIC RHINITIS 06/02/2007  . Allergy   . Arthritis    DDD of cervical, thoracic, and lumbar spine  . Blood transfusion without reported diagnosis   . Cataract    Bilateral removed cateracts  . Cervical spondylosis    C5-6 and C6-7.  Pain mgmt as per Dr. Nelva Bush.  . Chronic gastritis without bleeding   . Chronic low back pain 02/18/2009   s/p fusion L3-4 through L5-S1.  Pain meds per Dr. Nelva Bush  . Chronic renal insufficiency, stage 3 (moderate)    borderline II/III (GFR about 55 ml/min) as of 2021  . DEPRESSION 06/02/2007  . Diverticulosis   . GERD (gastroesophageal reflux disease)   . Heart murmur   . Hiatal hernia   . Hypercholesterolemia   . HYPOTHYROIDISM 06/02/2007   GOITER  . IBS (irritable bowel syndrome)   . INSOMNIA 06/02/2007  . Lymphocytic colitis   . Muscle spasms of lower extremity   . Nasal septal perforation    chronic; hx of epistaxis  . Normocytic anemia 04/2015   HEME + in ED 08/07/15--endoscopic eval unrevealing.  Iron studies fine-->?Anemia of chronic dz (lymphocytic colitis?).  Hb 02/2020 11.5.  . OSTEOPENIA 02/18/2009   Repeat DEXA 07/2014 showed osteoporosis by T score in radius but spine and hip T scores were normal-continue vit D and calcium and repeat DEXA 08/2016 unchanged.  Repeat DEXA 2 yrs.  . Peripheral edema    LE's, nonpitting  . Renal cyst, acquired, right    1.2 cm, 2021  . Simple hepatic cyst    x 1. 2021    Past Surgical History:  Procedure Laterality Date  . ABDOMINAL HYSTERECTOMY     for DUB (ovaries are  still in)  . BACK SURGERY     X 5: fusion of L3-L4 through L5-S1  . CATARACT EXTRACTION Bilateral   . CHOLECYSTECTOMY  2012  . COLONOSCOPY  06/2007; 01/2015   2008 Diverticulosis, o/w normal.  2016 showed lymphocytic colitis with mild diverticulosis: pt was started on oral budesonide at that time.  . CT SCAN  09/03/2017   ABDOMEN, HEAD  . DEXA  08/16/2014; 09/14/16   Hip and spine ok; radius osteoporotic---but meds not indicated.  Repeat 08/2018  . ESOPHAGOGASTRODUODENOSCOPY  10/2007; 11/27/15; 04/26/20   03/2020: chronic gastritis, H pylori neg, celiac neg.  +Barretts.  Hyperplastic gastric polyp.  Marland Kitchen HIP CLOSED REDUCTION Right 09/20/2018   Procedure: CLOSED REDUCTION HIP;  Surgeon: Paralee Cancel, MD;  Location: [facility];  Service: Orthopedics;  Laterality: Right;  . REVERSE SHOULDER ARTHROPLASTY Left 06/09/2019   Procedure: REVERSE SHOULDER ARTHROPLASTY;  Surgeon: Netta Cedars, MD;  Location: [facility];  Service: Orthopedics;  Laterality: Left;  . SHOULDER SURGERY Right    \  . TOTAL HIP ARTHROPLASTY  05/31/2012   Procedure: TOTAL HIP ARTHROPLASTY ANTERIOR APPROACH;  Surgeon: Mauri Pole, MD;  Location: [facility];  Service: Orthopedics;  Laterality: Left;  . TOTAL HIP ARTHROPLASTY Right 08/30/2018   Procedure: RIGHT TOTAL HIP ARTHROPLASTY ANTERIOR APPROACH;  Surgeon: Paralee Cancel,  MD;  Location: [facility];  Service: Orthopedics;  Laterality: Right;  70 mins    Family History  Problem Relation Age of Onset  . Arthritis Mother   . Stroke Mother   . Breast cancer Mother   . Diabetes Mother   . Colon cancer Father   . Heart disease Father   . Heart disease Sister   . Breast cancer Sister   . Lung cancer Brother        smoked  . Uterine cancer Sister   . Esophageal cancer Neg Hx   . Rectal cancer Neg Hx   . Stomach cancer Neg Hx     Social History   Tobacco Use  . Smoking status: Never Smoker  . Smokeless tobacco: Never Used  Vaping Use  . Vaping Use: Never used  Substance Use Topics  .  Alcohol use: No    Alcohol/week: 0.0 standard drinks  . Drug use: No     Home Medications Prior to Admission medications   Medication Sig Start Date End Date Taking? Authorizing Provider  alprazolam Duanne Moron) 2 MG tablet TAKE 1 TABLET BY MOUTH TWICE A DAY 09/06/20   McGowen, Adrian Blackwater, MD  ACETAMINOPHEN PO Take by mouth as needed. Patient not taking: Reported on 07/24/2020    [provider]  ammonium lactate (LAC-HYDRIN) 12 % lotion  05/01/20   [provider]  atorvastatin (LIPITOR) 10 MG tablet Take 10 mg by mouth daily.  06/12/18   [provider]  furosemide (LASIX) 20 MG tablet Take 1 tablet (20 mg total) by mouth daily as needed for fluid or edema. 09/20/18 09/04/20  Danae Orleans, PA-C  lamoTRIgine (LAMICTAL) 25 MG tablet 2 tabs po qd 09/04/20   McGowen, Adrian Blackwater, MD  levothyroxine (SYNTHROID) 75 MCG tablet Take 1.5 tablets by mouth on Mondays and Fridays and 1 tab all other days. 08/27/20   McGowen, Adrian Blackwater, MD  methocarbamol (ROBAXIN) 750 MG tablet Take 750 mg by mouth every 8 (eight) hours as needed for muscle spasms.    [provider]  naloxone [facility]) 4 MG/0.1ML LIQD nasal spray kit Narcan 4 mg/actuation nasal spray  Take by nasal route every 3 minutes until patient awakes or EMS arrives. Patient not taking: Reported on 06/21/2020    [provider]  Oxycodone HCl 10 MG TABS  06/05/20   [provider]  polyethylene glycol (MIRALAX / GLYCOLAX) packet Take 17 g by mouth 2 (two) times daily. Patient not taking: Reported on 09/04/2020 08/31/18   Danae Orleans, PA-C  sertraline (ZOLOFT) 100 MG tablet Take 1 tablet (100 mg total) by mouth 2 (two) times daily. 09/04/20   McGowen, Adrian Blackwater, MD  triamcinolone cream (KENALOG) 0.1 %  05/20/20   [provider]  Wheat Dextrin (BENEFIBER PO) Take by mouth daily.    [provider]     Allergies    Latex   Review of Systems   Review of Systems A comprehensive review of systems  was completed and negative except as noted in HPI.    Physical Exam BP 126/74   Pulse 79   Temp 98.2 F (36.8 C) (Oral)   Resp (!) 21   Ht 5' 4"  (1.626 m)   Wt 66.2 kg   SpO2 96%   BMI 25.06 kg/m   Physical Exam Vitals and nursing note reviewed.  Constitutional:      Appearance: Normal appearance.  HENT:     Head: Normocephalic and atraumatic.  Nose: Nose normal.     Mouth/Throat:     Mouth: Mucous membranes are moist.  Eyes:     Extraocular Movements: Extraocular movements intact.     Conjunctiva/sclera: Conjunctivae normal.  Cardiovascular:     Rate and Rhythm: Normal rate.  Pulmonary:     Effort: Pulmonary effort is normal.     Breath sounds: Normal breath sounds.  Abdominal:     General: Abdomen is flat.     Palpations: Abdomen is soft.     Tenderness: There is no abdominal tenderness.  Musculoskeletal:        General: No swelling. Normal range of motion.     Cervical back: Neck supple.  Skin:    General: Skin is warm and dry.  Neurological:     General: No focal deficit present.     Mental Status: She is alert.  Psychiatric:        Mood and Affect: Mood normal.      ED Results / Procedures / Treatments   Labs (all labs ordered are listed, but only abnormal results are displayed) Labs Reviewed  SARS CORONAVIRUS 2 BY RT PCR ([facility]) - Abnormal; Notable for the following components:      Result Value   SARS Coronavirus 2 POSITIVE (*)    All other components within normal limits  CBC WITH DIFFERENTIAL/PLATELET - Abnormal; Notable for the following components:   RBC 3.61 (*)    Hemoglobin 11.1 (*)    HCT 32.1 (*)    All other components within normal limits  COMPREHENSIVE METABOLIC PANEL - Abnormal; Notable for the following components:   Sodium 126 (*)    Potassium 3.4 (*)    Chloride 92 (*)    Glucose, Bld 124 (*)    Calcium 8.2 (*)    Albumin 3.2 (*)    AST 64 (*)    All other components within  normal limits  D-DIMER, QUANTITATIVE (NOT AT [facility]) - Abnormal; Notable for the following components:   D-Dimer, Quant 1.04 (*)    All other components within normal limits  CULTURE, BLOOD (ROUTINE X 2)  CULTURE, BLOOD (ROUTINE X 2)  LACTIC ACID, PLASMA  LACTIC ACID, PLASMA  PROCALCITONIN  LACTATE DEHYDROGENASE  FERRITIN  TRIGLYCERIDES  FIBRINOGEN  C-REACTIVE PROTEIN    EKG EKG Interpretation  Date/Time:  Friday September 13 2020 22:00:48 EDT Ventricular Rate:  79 PR Interval:    QRS Duration: 98 QT Interval:  390 QTC Calculation: 445 R Axis:   1 Text Interpretation: Sinus rhythm Probable left atrial enlargement nonspecific T wave flattening No significant change since last tracing Confirmed by Calvert Cantor [phone]) on 09/14/2020 10:39:11 PM    Radiology No results found.  Procedures Procedures  Medications Ordered in the ED Medications  remdesivir 100 mg in sodium chloride 0.9 % 100 mL IVPB (100 mg Intravenous New Bag/Given 09/11/2020 2321)    Followed by  remdesivir 100 mg in sodium chloride 0.9 % 100 mL IVPB (has no administration in time range)  dexamethasone (DECADRON) injection 10 mg (10 mg Intravenous Given 09/10/2020 2321)     MDM Rules/Calculators/A&P MDM Patient noted to be severely hypoxic on room air on arrival, improved slowly with [place] now on 4L. Suspect Covid, will begin Covid order panel. Anticipate admission.  ED Course  I have reviewed the triage vital signs and the nursing notes.  Pertinent labs & imaging results that were available during my care of the patient were reviewed  by me and considered in my medical decision making (see chart for details).  Clinical Course as of Sep 13 2322  Fri Sep 13, 2020  2304 Covid confirmed positive, CMP with mild hyponatremia and hypokalemia. CBC unremarkable, dimer mildly elevated, lactic acid normal.    [CS]  2305 Hospitalist paged for admission.    [CS]  [place] of the patient signed out to Dr. Randal Buba at  the change of shift pending Hospitalist call.    [CS]    Clinical Course User Index [CS] Truddie Hidden, MD    Final Clinical Impression(s) / ED Diagnoses Final diagnoses:  [id number]    Rx / DC Orders ED Discharge Orders    None       Truddie Hidden, MD 09/03/2020 2324

## 2020-09-14 DIAGNOSIS — R0602 Shortness of breath: Secondary | ICD-10-CM | POA: Diagnosis present

## 2020-09-14 DIAGNOSIS — E039 Hypothyroidism, unspecified: Secondary | ICD-10-CM | POA: Diagnosis not present

## 2020-09-14 DIAGNOSIS — J9601 Acute respiratory failure with hypoxia: Secondary | ICD-10-CM | POA: Diagnosis present

## 2020-09-14 DIAGNOSIS — I361 Nonrheumatic tricuspid (valve) insufficiency: Secondary | ICD-10-CM | POA: Diagnosis not present

## 2020-09-14 DIAGNOSIS — U071 COVID-19: Principal | ICD-10-CM

## 2020-09-14 DIAGNOSIS — F329 Major depressive disorder, single episode, unspecified: Secondary | ICD-10-CM | POA: Diagnosis present

## 2020-09-14 DIAGNOSIS — M810 Age-related osteoporosis without current pathological fracture: Secondary | ICD-10-CM | POA: Diagnosis present

## 2020-09-14 DIAGNOSIS — K219 Gastro-esophageal reflux disease without esophagitis: Secondary | ICD-10-CM | POA: Diagnosis present

## 2020-09-14 DIAGNOSIS — I340 Nonrheumatic mitral (valve) insufficiency: Secondary | ICD-10-CM | POA: Diagnosis not present

## 2020-09-14 DIAGNOSIS — N183 Chronic kidney disease, stage 3 unspecified: Secondary | ICD-10-CM | POA: Diagnosis present

## 2020-09-14 DIAGNOSIS — E871 Hypo-osmolality and hyponatremia: Secondary | ICD-10-CM | POA: Diagnosis present

## 2020-09-14 DIAGNOSIS — I129 Hypertensive chronic kidney disease with stage 1 through stage 4 chronic kidney disease, or unspecified chronic kidney disease: Secondary | ICD-10-CM | POA: Diagnosis present

## 2020-09-14 DIAGNOSIS — J1282 Pneumonia due to coronavirus disease 2019: Secondary | ICD-10-CM | POA: Diagnosis present

## 2020-09-14 DIAGNOSIS — D638 Anemia in other chronic diseases classified elsewhere: Secondary | ICD-10-CM | POA: Diagnosis present

## 2020-09-14 DIAGNOSIS — D649 Anemia, unspecified: Secondary | ICD-10-CM

## 2020-09-14 DIAGNOSIS — R7401 Elevation of levels of liver transaminase levels: Secondary | ICD-10-CM | POA: Diagnosis present

## 2020-09-14 DIAGNOSIS — E870 Hyperosmolality and hypernatremia: Secondary | ICD-10-CM | POA: Diagnosis not present

## 2020-09-14 DIAGNOSIS — I6340 Cerebral infarction due to embolism of unspecified cerebral artery: Secondary | ICD-10-CM | POA: Diagnosis not present

## 2020-09-14 DIAGNOSIS — Z66 Do not resuscitate: Secondary | ICD-10-CM | POA: Diagnosis not present

## 2020-09-14 DIAGNOSIS — D6859 Other primary thrombophilia: Secondary | ICD-10-CM | POA: Diagnosis present

## 2020-09-14 DIAGNOSIS — J690 Pneumonitis due to inhalation of food and vomit: Secondary | ICD-10-CM | POA: Diagnosis present

## 2020-09-14 DIAGNOSIS — E785 Hyperlipidemia, unspecified: Secondary | ICD-10-CM | POA: Diagnosis present

## 2020-09-14 DIAGNOSIS — I639 Cerebral infarction, unspecified: Secondary | ICD-10-CM | POA: Diagnosis not present

## 2020-09-14 DIAGNOSIS — I6389 Other cerebral infarction: Secondary | ICD-10-CM | POA: Diagnosis not present

## 2020-09-14 DIAGNOSIS — R1312 Dysphagia, oropharyngeal phase: Secondary | ICD-10-CM | POA: Diagnosis not present

## 2020-09-14 DIAGNOSIS — Z90710 Acquired absence of both cervix and uterus: Secondary | ICD-10-CM | POA: Diagnosis not present

## 2020-09-14 DIAGNOSIS — Z515 Encounter for palliative care: Secondary | ICD-10-CM | POA: Diagnosis not present

## 2020-09-14 DIAGNOSIS — M1990 Unspecified osteoarthritis, unspecified site: Secondary | ICD-10-CM | POA: Diagnosis present

## 2020-09-14 DIAGNOSIS — Z9049 Acquired absence of other specified parts of digestive tract: Secondary | ICD-10-CM | POA: Diagnosis not present

## 2020-09-14 DIAGNOSIS — F419 Anxiety disorder, unspecified: Secondary | ICD-10-CM | POA: Diagnosis present

## 2020-09-14 DIAGNOSIS — R410 Disorientation, unspecified: Secondary | ICD-10-CM | POA: Diagnosis not present

## 2020-09-14 DIAGNOSIS — I6330 Cerebral infarction due to thrombosis of unspecified cerebral artery: Secondary | ICD-10-CM | POA: Diagnosis not present

## 2020-09-14 DIAGNOSIS — J3489 Other specified disorders of nose and nasal sinuses: Secondary | ICD-10-CM | POA: Diagnosis not present

## 2020-09-14 DIAGNOSIS — K2950 Unspecified chronic gastritis without bleeding: Secondary | ICD-10-CM | POA: Diagnosis present

## 2020-09-14 LAB — MAGNESIUM: Magnesium: 1.6 mg/dL — ABNORMAL LOW (ref 1.7–2.4)

## 2020-09-14 LAB — BASIC METABOLIC PANEL
Anion gap: 10 (ref 5–15)
BUN: 8 mg/dL (ref 8–23)
CO2: 22 mmol/L (ref 22–32)
Calcium: 8.5 mg/dL — ABNORMAL LOW (ref 8.9–10.3)
Chloride: 100 mmol/L (ref 98–111)
Creatinine, Ser: 0.84 mg/dL (ref 0.44–1.00)
GFR calc Af Amer: >60 mL/min (ref >60)
GFR calc non Af Amer: >60 mL/min (ref >60)
Glucose, Bld: 172 mg/dL — ABNORMAL HIGH (ref 70–99)
Potassium: 3.7 mmol/L (ref 3.5–5.1)
Sodium: 132 mmol/L — ABNORMAL LOW (ref 135–145)

## 2020-09-14 LAB — FERRITIN: Ferritin: 624 ng/mL — ABNORMAL HIGH (ref 11–307)

## 2020-09-14 LAB — TRIGLYCERIDES: Triglycerides: 73 mg/dL (ref <150)

## 2020-09-14 LAB — C-REACTIVE PROTEIN: CRP: 16.4 mg/dL — ABNORMAL HIGH (ref <1.0)

## 2020-09-14 LAB — BRAIN NATRIURETIC PEPTIDE: B Natriuretic Peptide: 229.9 pg/mL — ABNORMAL HIGH (ref 0.0–100.0)

## 2020-09-14 LAB — PREALBUMIN: Prealbumin: 7.7 mg/dL — ABNORMAL LOW (ref 18–38)

## 2020-09-14 LAB — FIBRINOGEN: Fibrinogen: 472 mg/dL (ref 210–475)

## 2020-09-14 LAB — LACTATE DEHYDROGENASE: LDH: 386 U/L — ABNORMAL HIGH (ref 98–192)

## 2020-09-14 LAB — PROCALCITONIN: Procalcitonin: 0.16 ng/mL

## 2020-09-14 MED ORDER — ENSURE ENLIVE PO LIQD
237.0000 mL | Freq: Two times a day (BID) | ORAL | Status: DC
  Administered 2020-09-14 – 2020-09-25 (×15): 237 mL via ORAL

## 2020-09-14 MED ORDER — ONDANSETRON HCL 4 MG/2ML IJ SOLN
4.0000 mg | Freq: Four times a day (QID) | INTRAMUSCULAR | Status: DC | PRN
  Administered 2020-09-17: 4 mg via INTRAVENOUS
  Filled 2020-09-14: qty 2

## 2020-09-14 MED ORDER — CALCIUM GLUCONATE-NACL 1-0.675 GM/50ML-% IV SOLN
1.0000 g | Freq: Once | INTRAVENOUS | Status: AC
  Administered 2020-09-14: 1000 mg via INTRAVENOUS
  Filled 2020-09-14: qty 50

## 2020-09-14 MED ORDER — ZINC SULFATE 220 (50 ZN) MG PO CAPS
220.0000 mg | ORAL_CAPSULE | Freq: Every day | ORAL | Status: DC
  Administered 2020-09-14 – 2020-09-28 (×9): 220 mg via ORAL
  Filled 2020-09-14 (×11): qty 1

## 2020-09-14 MED ORDER — SODIUM CHLORIDE 0.9 % IV SOLN
500.0000 mg | INTRAVENOUS | Status: DC
  Administered 2020-09-14: 500 mg via INTRAVENOUS
  Filled 2020-09-14 (×2): qty 500

## 2020-09-14 MED ORDER — SODIUM CHLORIDE 0.9 % IV SOLN
INTRAVENOUS | Status: DC | PRN
  Administered 2020-09-14: 10 mL via INTRAVENOUS

## 2020-09-14 MED ORDER — METHYLPREDNISOLONE SODIUM SUCC 40 MG IJ SOLR
0.5000 mg/kg | Freq: Two times a day (BID) | INTRAMUSCULAR | Status: DC
  Administered 2020-09-14 – 2020-09-16 (×5): 33.2 mg via INTRAVENOUS
  Filled 2020-09-14 (×5): qty 1

## 2020-09-14 MED ORDER — SODIUM CHLORIDE 0.9% FLUSH
3.0000 mL | Freq: Two times a day (BID) | INTRAVENOUS | Status: DC
  Administered 2020-09-14 – 2020-10-04 (×31): 3 mL via INTRAVENOUS

## 2020-09-14 MED ORDER — MAGNESIUM OXIDE 400 (241.3 MG) MG PO TABS
400.0000 mg | ORAL_TABLET | Freq: Once | ORAL | Status: AC
  Administered 2020-09-14: 400 mg via ORAL
  Filled 2020-09-14: qty 1

## 2020-09-14 MED ORDER — PREDNISONE 20 MG PO TABS: 50.0000 mg | ORAL_TABLET | Freq: Every day | ORAL | Status: DC

## 2020-09-14 MED ORDER — SODIUM CHLORIDE 0.9 % IV SOLN
2.0000 g | INTRAVENOUS | Status: DC
  Administered 2020-09-14: 2 g via INTRAVENOUS
  Filled 2020-09-14: qty 20

## 2020-09-14 MED ORDER — ASCORBIC ACID 500 MG PO TABS
500.0000 mg | ORAL_TABLET | Freq: Every day | ORAL | Status: DC
  Administered 2020-09-14 – 2020-09-28 (×9): 500 mg via ORAL
  Filled 2020-09-14 (×11): qty 1

## 2020-09-14 MED ORDER — ONDANSETRON HCL 4 MG PO TABS
4.0000 mg | ORAL_TABLET | Freq: Four times a day (QID) | ORAL | Status: DC | PRN
  Administered 2020-09-14: 4 mg via ORAL
  Filled 2020-09-14: qty 1

## 2020-09-14 MED ORDER — BARICITINIB 2 MG PO TABS
4.0000 mg | ORAL_TABLET | ORAL | Status: DC
  Administered 2020-09-14 – 2020-09-20 (×7): 4 mg via ORAL
  Filled 2020-09-14 (×7): qty 2

## 2020-09-14 MED ORDER — ENOXAPARIN SODIUM 40 MG/0.4ML ~~LOC~~ SOLN
40.0000 mg | SUBCUTANEOUS | Status: DC
  Administered 2020-09-14 – 2020-10-07 (×24): 40 mg via SUBCUTANEOUS
  Filled 2020-09-14 (×24): qty 0.4

## 2020-09-14 MED ORDER — POTASSIUM CHLORIDE CRYS ER 20 MEQ PO TBCR
20.0000 meq | EXTENDED_RELEASE_TABLET | ORAL | Status: AC
  Administered 2020-09-14: 20 meq via ORAL
  Filled 2020-09-14: qty 1

## 2020-09-14 MED ORDER — ORAL CARE MOUTH RINSE
15.0000 mL | Freq: Two times a day (BID) | OROMUCOSAL | Status: DC
  Administered 2020-09-15 – 2020-10-07 (×41): 15 mL via OROMUCOSAL

## 2020-09-14 MED ORDER — RISAQUAD PO CAPS
1.0000 | ORAL_CAPSULE | Freq: Every day | ORAL | Status: DC
  Administered 2020-09-15 – 2020-10-07 (×17): 1 via ORAL
  Filled 2020-09-14 (×19): qty 1

## 2020-09-14 MED ORDER — GUAIFENESIN-DM 100-10 MG/5ML PO SYRP
10.0000 mL | ORAL_SOLUTION | ORAL | Status: DC | PRN
  Administered 2020-09-15 (×2): 10 mL via ORAL
  Filled 2020-09-14 (×2): qty 10

## 2020-09-14 MED ORDER — HYDROCOD POLST-CPM POLST ER 10-8 MG/5ML PO SUER
5.0000 mL | Freq: Two times a day (BID) | ORAL | Status: DC | PRN
  Administered 2020-09-16: 5 mL via ORAL
  Filled 2020-09-14: qty 5

## 2020-09-14 MED ORDER — ACETAMINOPHEN 325 MG PO TABS
650.0000 mg | ORAL_TABLET | Freq: Four times a day (QID) | ORAL | Status: DC | PRN
  Administered 2020-09-14 – 2020-10-04 (×2): 650 mg via ORAL
  Filled 2020-09-14 (×2): qty 2

## 2020-09-14 MED ORDER — ALBUTEROL SULFATE HFA 108 (90 BASE) MCG/ACT IN AERS
2.0000 | INHALATION_SPRAY | Freq: Four times a day (QID) | RESPIRATORY_TRACT | Status: DC
  Administered 2020-09-14 – 2020-10-06 (×79): 2 via RESPIRATORY_TRACT
  Filled 2020-09-14 (×3): qty 6.7

## 2020-09-14 MED ORDER — FAMOTIDINE 20 MG PO TABS
20.0000 mg | ORAL_TABLET | Freq: Two times a day (BID) | ORAL | Status: DC
  Administered 2020-09-14 – 2020-09-21 (×15): 20 mg via ORAL
  Filled 2020-09-14 (×16): qty 1

## 2020-09-14 MED ORDER — MAGNESIUM SULFATE 2 GM/50ML IV SOLN
2.0000 g | Freq: Once | INTRAVENOUS | Status: AC
  Administered 2020-09-14: 2 g via INTRAVENOUS
  Filled 2020-09-14: qty 50

## 2020-09-14 NOTE — Plan of Care (Signed)
  Problem: Education: Goal: Knowledge of risk factors and measures for prevention of condition will improve Outcome: Progressing   Problem: Respiratory: Goal: Will maintain a patent airway Outcome: Progressing   Problem: Education: Goal: Knowledge of General Education information will improve Description: Including pain rating scale, medication(s)/side effects and non-pharmacologic comfort measures Outcome: Progressing   Problem: Health Behavior/Discharge Planning: Goal: Ability to manage health-related needs will improve Outcome: Progressing   Problem: Clinical Measurements: Goal: Respiratory complications will improve Outcome: Progressing   Problem: Activity: Goal: Risk for activity intolerance will decrease Outcome: Progressing   Problem: Nutrition: Goal: Adequate nutrition will be maintained Outcome: Progressing   

## 2020-09-14 NOTE — H&P (Signed)
History and Physical    April Brewer VFI:[id number] DOB: 07-Nov-1946 DOA: 09/15/2020  Referring MD/NP/PA: Shela Leff, MD PCP: Tammi Sou, MD  Patient coming from: Transfer from [facility]  Chief Complaint: Shortness of breath and cough  I have personally briefly reviewed patient's old medical records in [place]   HPI: April Brewer is a 74 y.o. female with medical history significant of hyperlipidemia, hypothyroidism, GERD, lymphocytic colitis, anxiety, and depression presented with complaints of shortness of breath and cough over the last 2 - 3 days.  She reports that her cough has been productive.  Noted associated symptoms of fever, loss of taste/smell, poor appetite, nausea, generalized weakness, and diarrhea.  She reports that she had not received any COVID-19 vaccines due to concerns that it may not be safe.  Due to the worsening of her symptoms her son brought her to the [facility] last night.  ED Course: Upon admission into the emergency department patient was seen to be afebrile with respirations 19-41, blood pressure 87/44-137/77, and O2 saturations as low as 81% currently maintained around 92% on 10 L nasal cannula oxygen.  COVID-19 screening was positive.  Labs from 9/17 significant for WBC 5.3, hemoglobin 11.1, sodium 126, potassium 3.4, calcium 8.2, albumin 3.2, AST 64, LDH 386, ferritin 624, CRP 16.4, pro calcitonin 0.16, D-dimer 1.04, fibrinogen 472, and lactic acid 1.4.  Chest x-ray showed diffuse bilateral atypical pneumonia.  She had been given 10 mg of Decadron IV and remdesivir.  Patient was accepted to a progressive bed here at [facility].  Review of Systems  Constitutional: Positive for fever and malaise/fatigue.  HENT: Negative for ear discharge and nosebleeds.   Eyes: Negative for photophobia and pain.  Respiratory: Positive for cough, sputum production and shortness of breath.   Cardiovascular: Positive for chest pain (With  coughing). Negative for leg swelling.  Gastrointestinal: Positive for diarrhea and nausea. Negative for abdominal pain and vomiting.  Genitourinary: Negative for dysuria and hematuria.  Musculoskeletal: Negative for falls and myalgias.  Skin: Negative for itching and rash.  Neurological: Positive for weakness. Negative for loss of consciousness.  Endo/Heme/Allergies: Negative for environmental allergies and polydipsia.  Psychiatric/Behavioral: Negative for substance abuse. The patient has insomnia.     Past Medical History:  Diagnosis Date  . ALLERGIC RHINITIS 06/02/2007  . Allergy   . Arthritis    DDD of cervical, thoracic, and lumbar spine  . Blood transfusion without reported diagnosis   . Cataract    Bilateral removed cateracts  . Cervical spondylosis    C5-6 and C6-7.  Pain mgmt as per Dr. Nelva Bush.  . Chronic gastritis without bleeding   . Chronic low back pain 02/18/2009   s/p fusion L3-4 through L5-S1.  Pain meds per Dr. Nelva Bush  . Chronic renal insufficiency, stage 3 (moderate)    borderline II/III (GFR about 55 ml/min) as of 2021  . DEPRESSION 06/02/2007  . Diverticulosis   . GERD (gastroesophageal reflux disease)   . Heart murmur   . Hiatal hernia   . Hypercholesterolemia   . HYPOTHYROIDISM 06/02/2007   GOITER  . IBS (irritable bowel syndrome)   . INSOMNIA 06/02/2007  . Lymphocytic colitis   . Muscle spasms of lower extremity   . Nasal septal perforation    chronic; hx of epistaxis  . Normocytic anemia 04/2015   HEME + in ED 08/07/15--endoscopic eval unrevealing.  Iron studies fine-->?Anemia of chronic dz (lymphocytic colitis?).  Hb 02/2020 11.5.  Marland Kitchen  OSTEOPENIA 02/18/2009   Repeat DEXA 07/2014 showed osteoporosis by T score in radius but spine and hip T scores were normal-continue vit D and calcium and repeat DEXA 08/2016 unchanged.  Repeat DEXA 2 yrs.  . Peripheral edema    LE's, nonpitting  . Renal cyst, acquired, right    1.2 cm, 2021  . Simple hepatic cyst    x 1. 2021     Past Surgical History:  Procedure Laterality Date  . ABDOMINAL HYSTERECTOMY     for DUB (ovaries are still in)  . BACK SURGERY     X 5: fusion of L3-L4 through L5-S1  . CATARACT EXTRACTION Bilateral   . CHOLECYSTECTOMY  2012  . COLONOSCOPY  06/2007; 01/2015   2008 Diverticulosis, o/w normal.  2016 showed lymphocytic colitis with mild diverticulosis: pt was started on oral budesonide at that time.  . CT SCAN  09/03/2017   ABDOMEN, HEAD  . DEXA  08/16/2014; 09/14/16   Hip and spine ok; radius osteoporotic---but meds not indicated.  Repeat 08/2018  . ESOPHAGOGASTRODUODENOSCOPY  10/2007; 11/27/15; 04/26/20   03/2020: chronic gastritis, H pylori neg, celiac neg.  +Barretts.  Hyperplastic gastric polyp.  Marland Kitchen HIP CLOSED REDUCTION Right 09/20/2018   Procedure: CLOSED REDUCTION HIP;  Surgeon: Paralee Cancel, MD;  Location: [facility];  Service: Orthopedics;  Laterality: Right;  . REVERSE SHOULDER ARTHROPLASTY Left 06/09/2019   Procedure: REVERSE SHOULDER ARTHROPLASTY;  Surgeon: Netta Cedars, MD;  Location: [facility];  Service: Orthopedics;  Laterality: Left;  . SHOULDER SURGERY Right    \  . TOTAL HIP ARTHROPLASTY  05/31/2012   Procedure: TOTAL HIP ARTHROPLASTY ANTERIOR APPROACH;  Surgeon: Mauri Pole, MD;  Location: [facility];  Service: Orthopedics;  Laterality: Left;  . TOTAL HIP ARTHROPLASTY Right 08/30/2018   Procedure: RIGHT TOTAL HIP ARTHROPLASTY ANTERIOR APPROACH;  Surgeon: Paralee Cancel, MD;  Location: [facility];  Service: Orthopedics;  Laterality: Right;  70 mins     reports that she has never smoked. She has never used smokeless tobacco. She reports that she does not drink alcohol and does not use drugs.  Allergies  Allergen Reactions  . Latex Rash    Family History  Problem Relation Age of Onset  . Arthritis Mother   . Stroke Mother   . Breast cancer Mother   . Diabetes Mother   . Colon cancer Father   . Heart disease Father   . Heart disease Sister   . Breast cancer Sister   . Lung  cancer Brother        smoked  . Uterine cancer Sister   . Esophageal cancer Neg Hx   . Rectal cancer Neg Hx   . Stomach cancer Neg Hx     Prior to Admission medications   Medication Sig Start Date End Date Taking? Authorizing Provider  alprazolam Duanne Moron) 2 MG tablet TAKE 1 TABLET BY MOUTH TWICE A DAY 09/06/20   McGowen, Adrian Blackwater, MD  ACETAMINOPHEN PO Take by mouth as needed. Patient not taking: Reported on 07/24/2020    [provider]  ammonium lactate (LAC-HYDRIN) 12 % lotion  05/01/20   [provider]  atorvastatin (LIPITOR) 10 MG tablet Take 10 mg by mouth daily.  06/12/18   [provider]  furosemide (LASIX) 20 MG tablet Take 1 tablet (20 mg total) by mouth daily as needed for fluid or edema. 09/20/18 09/04/20  Danae Orleans, PA-C  lamoTRIgine (LAMICTAL) 25 MG tablet 2 tabs po qd 09/04/20   Shawnie Dapper  H, MD  levothyroxine (SYNTHROID) 75 MCG tablet Take 1.5 tablets by mouth on Mondays and Fridays and 1 tab all other days. 08/27/20   McGowen, Adrian Blackwater, MD  methocarbamol (ROBAXIN) 750 MG tablet Take 750 mg by mouth every 8 (eight) hours as needed for muscle spasms.    [provider]  naloxone [facility]) 4 MG/0.1ML LIQD nasal spray kit Narcan 4 mg/actuation nasal spray  Take by nasal route every 3 minutes until patient awakes or EMS arrives. Patient not taking: Reported on 06/21/2020    [provider]  Oxycodone HCl 10 MG TABS  06/05/20   [provider]  polyethylene glycol (MIRALAX / GLYCOLAX) packet Take 17 g by mouth 2 (two) times daily. Patient not taking: Reported on 09/04/2020 08/31/18   Danae Orleans, PA-C  sertraline (ZOLOFT) 100 MG tablet Take 1 tablet (100 mg total) by mouth 2 (two) times daily. 09/04/20   McGowen, Adrian Blackwater, MD  triamcinolone cream (KENALOG) 0.1 %  05/20/20   [provider]  Wheat Dextrin (BENEFIBER PO) Take by mouth daily.    [provider]    Physical Exam:  Constitutional: Elderly female  who appears to be acutely ill Vitals:   09/14/20 0330 09/14/20 0400 09/14/20 0430 09/14/20 0642  BP: 120/71 137/77 122/81 133/73  Pulse: 67 76 70 78  Resp: (!) 27 (!) _0 Temp:  98.4 F (36.9 C)  97.9 F (36.6 C)  TempSrc:  Oral  Oral  SpO2: 99% 91% 92% 90%  Weight:      Height:       Eyes: PERRL, lids and conjunctivae normal ENMT: Mucous membranes are moist. Posterior pharynx clear of any exudate or lesions.Neck: normal, supple, no masses, no thyromegaly Respiratory: Tachypneic with fair aeration and intermittent crackles appreciated.  Currently on 15 L high flow nasal cannula oxygen with O2 saturations hovering around 90%. Cardiovascular: Regular rate and rhythm, no murmurs / rubs / gallops. No extremity edema. 2+ pedal pulses. No carotid bruits.  Abdomen: no tenderness, no masses palpated. No hepatosplenomegaly. Bowel sounds positive.  Musculoskeletal: no clubbing / cyanosis. No joint deformity upper and lower extremities. Good ROM, no contractures. Normal muscle tone.  Skin: no rashes, lesions, ulcers. No induration Neurologic: CN 2-12 grossly intact. Sensation intact, DTR normal. Strength 5/5 in all 4.  Psychiatric: Normal judgment and insight. Alert and oriented x 3. Normal mood.     Labs on Admission: I have personally reviewed following labs and imaging studies  CBC: Recent Labs  Lab 09/14/2020 2208  WBC 5.3  NEUTROABS 4.1  HGB 11.1*  HCT 32.1*  MCV 88.9  PLT 094   Basic Metabolic Panel: Recent Labs  Lab 09/10/2020 2208  NA 126*  K 3.4*  CL 92*  CO2 23  GLUCOSE 124*  BUN 11  CREATININE 0.70  CALCIUM 8.2*   GFR: Estimated Creatinine Clearance: 56.9 mL/min (by C-G formula based on SCr of 0.7 mg/dL). Liver Function Tests: Recent Labs  Lab 09/15/2020 2208  AST 64*  ALT 27  ALKPHOS 107  BILITOT 0.4  PROT 6.5  ALBUMIN 3.2*   No results for input(s): LIPASE, AMYLASE in the last 168 hours. No results for input(s): AMMONIA in the last 168  hours. Coagulation Profile: No results for input(s): INR, PROTIME in the last 168 hours. Cardiac Enzymes: No results for input(s): CKTOTAL, CKMB, CKMBINDEX, TROPONINI in the last 168 hours. BNP (last 3 results) No results for input(s): PROBNP in the last 8760 hours. HbA1C:  No results for input(s): HGBA1C in the last 72 hours. CBG: No results for input(s): GLUCAP in the last 168 hours. Lipid Profile: Recent Labs    09/22/2020 2208  TRIG 73   Thyroid Function Tests: No results for input(s): TSH, T4TOTAL, FREET4, T3FREE, THYROIDAB in the last 72 hours. Anemia Panel: Recent Labs    09/02/2020 2208  FERRITIN 624*   Urine analysis:    Component Value Date/Time   COLORURINE YELLOW 02/03/2015 0255   APPEARANCEUR CLEAR 02/03/2015 0255   LABSPEC 1.006 02/03/2015 0255   PHURINE 5.5 02/03/2015 0255   GLUCOSEU NEGATIVE 02/03/2015 0255   HGBUR NEGATIVE 02/03/2015 0255   BILIRUBINUR negative 03/18/2017 0924   KETONESUR NEGATIVE 02/03/2015 0255   PROTEINUR negative 03/18/2017 0924   PROTEINUR NEGATIVE 02/03/2015 0255   UROBILINOGEN 0.2 03/18/2017 0924   UROBILINOGEN 0.2 02/03/2015 0255   NITRITE positive 03/18/2017 0924   NITRITE NEGATIVE 02/03/2015 0255   LEUKOCYTESUR small (1+) (A) 03/18/2017 0924   Sepsis Labs: Recent Results (from the past 240 hour(s))  SARS Coronavirus 2 by RT PCR (hospital order, performed in [place] hospital lab) Nasopharyngeal Nasopharyngeal Swab     Status: Abnormal   Collection Time: 09/18/2020 10:07 PM   Specimen: Nasopharyngeal Swab  Result Value Ref Range Status   SARS Coronavirus 2 POSITIVE (A) NEGATIVE Final    Comment: RESULT CALLED TO, READ BACK BY AND VERIFIED WITH: NEAL,K, RN @ 0017 09/05/2020 BY GWYN,P (NOTE) SARS-CoV-2 target nucleic acids are DETECTED  SARS-CoV-2 RNA is generally detectable in upper respiratory specimens  during the acute phase of infection.  Positive results are indicative  of the presence of the identified virus, but do  not rule out bacterial infection or co-infection with other pathogens not detected by the test.  Clinical correlation with patient history and  other diagnostic information is necessary to determine patient infection status.  The expected result is negative.  Fact Sheet for Patients:   StrictlyIdeas.no   Fact Sheet for Healthcare Providers:   BankingDealers.co.za    This test is not yet approved or cleared by the [place] FDA and  has been authorized for detection and/or diagnosis of SARS-CoV-2 by FDA under an Emergency Use Authorization (EUA).  This EUA will remain in effect (meaning this t est can be used) for the duration of  the COVID-19 declaration under Section 564(b)(1) of the Act, 21 U.S.C. section 360-bbb-3(b)(1), unless the authorization is terminated or revoked sooner.  Performed at [facility], [address] [place], [place] [address]      Radiological Exams on Admission: DG Chest Portable 1 View  Result Date: 09/01/2020 CLINICAL DATA:  Cough, congestion, fever, loss of taste and smell for 1 week EXAM: PORTABLE CHEST 1 VIEW COMPARISON:  01/11/2014 FINDINGS: Single frontal view of the chest demonstrates stable enlarged cardiac silhouette. There is diffuse increased interstitial prominence with bilateral ground-glass consolidation. No effusion or pneumothorax. No acute bony abnormalities. Bilateral shoulder arthroplasties. IMPRESSION: 1. Diffuse interstitial and ground-glass opacities, consistent with bilateral atypical pneumonia given clinical history. Electronically Signed   By: Randa Ngo M.D.   On: 09/15/2020 23:33    EKG: Independently reviewed.  Sinus rhythm at 79 bpm with left atrial abnormality  Assessment/Plan Acute respiratory failure with hypoxia secondary to pneumonia due to COVID-19: Patient presented with complaints of cough, shortness of breath, fevers, poor appetite, and generalized  malaise.  O2 saturations noted as low as 81% requiring 10L nasal cannula oxygen and maintain O2 saturations greater  than 90%.  COVID-19 screening was positive.  Chest x-ray noting bilateral pneumonia.  She had been started on remdesivir and Decadron IV.  Inflammatory markers elevated LDH 386, ferritin 624, CRP 16.4, pro calcitonin 0.16, D-dimer 1.04.  Patient appears to have a severe case COVID-19 for which patient has progressive -Admit to a progressive bed -COVID-19 admission order set utilized -Continuous pulse oximetry with oxygen to maintain O2 saturations greater than 90% -Follow-up blood and sputum cultures/studies -Remdesivir per pharmacy day 2 of 5 -Change Decadron to Solu-Medrol IV -Barcitinib started due to the severity of patient's symptoms and oxygen requirements after discussing this with the patient's family over the phone. -Albuterol inhaler -Empirically started Rocephin and azithromycin -Vitamin C and zinc  -Antitussives as needed -Tylenol as needed for fever -Self prone if able  Hyponatremia hypokalemia hypocalcemia: Acute.  Labs from 9/17 revealed sodium 126, potassium 3.4, and calcium 8.2.  Review of records shows electrolytes have been intermittently low in the past suspect likely related with acute infection and poor p.o. intake.  -Give 20 mEq of potassium chloride p.o. -Give 1 g of calcium gluconate IV -Check BMP with magnesium, and continue to monitor electrolytes replacing as needed  Generalized weakness: Suspect symptoms related with pneumonia due toCOVID-19 and acute respiratory failure. -PT consulted  Normocytic anemia: Chronic.  On admission hemoglobin initially noted to be 11.1 which appears to be near patient's baseline.  Denied any reports of bleeding. -Continue to monitor  Suspected protein calorie malnutrition: Acute.  On admission albumin noted to be just mildly low at 3.2. -Check prealbumin -Ensure shakes inbetween meals  Elevated AST: Acute.  AST  was mildly elevated at 64. -Continue to monitor  Hypothyroidism: TSH noted to be within normal limits at 1.86 on 08/26/2020.  Home regimen includes levothyroxine 75 mcg daily. -Continue levothyroxine     GI prophylaxis: Pepcid DVT prophylaxis: Lovenox Code Status: Full Family Communication: Patient's son updated over the phone Disposition Plan: To be determined Consults called: None Admission status: Inpatient requiring more than 2 midnight stay due to new oxygen requirement in the setting of COVID-19.  Norval Morton MD [facility] Pager [phone]   If 7PM-7AM, please contact night-coverage [email or web address] Password [facility]  09/14/2020, 7:05 AM

## 2020-09-14 NOTE — Progress Notes (Signed)
   09/14/20 0642  Vitals  Temp 97.9 F (36.6 C)  Temp Source Oral  BP 133/73  MAP (mmHg) 91  BP Location Left Arm  BP Method Automatic  Patient Position (if appropriate) Lying  Pulse Rate 78  Pulse Rate Source Monitor  ECG Heart Rate 78  Resp 20  Level of Consciousness  Level of Consciousness Alert  MEWS COLOR  MEWS Score Color Green  Oxygen Therapy  SpO2 90 %  O2 Device Nasal Cannula  O2 Flow Rate (L/min) 10 L/min  Pain Assessment  Pain Scale 0-10  Pain Score 0  POSS Scale ([place]ero Opioid Sedation Scale)  POSS *See Group Information* 1-Acceptable,Awake and alert  PCA/Epidural/Spinal Assessment  Respiratory Pattern Regular;Unlabored  MEWS Score  MEWS Temp 0  MEWS Systolic 0  MEWS Pulse 0  MEWS RR 0  MEWS LOC 0  MEWS Score 0    Received pt from [place] around 0630~; per report pt was on 4L before being transported, but upon admission pt had to be increased to 10L O2 via [employer] (pt does appear only to have some minor dyspnea at rest). Pt was explained reason why they are in the hospital and pt understands; no c/o pain or any other signs and symptoms currently except for wanting something to drink, but informed pt that they currently have an NPO order and this RN will contact admitting team for further orders.  Pt was cleaned and skin check was done; placed pt on Purewick and bed alarm turned on. Pt had no further questions or concerns.

## 2020-09-14 NOTE — Progress Notes (Signed)
   09/14/20 2003  Assess: MEWS Score  Temp 98.1 F (36.7 C)  BP 128/68  Pulse Rate 98  ECG Heart Rate 99  Resp (!) 23  Level of Consciousness Alert  SpO2 (!) 86 %  O2 Device HFNC  Patient Activity (if Appropriate) In chair  O2 Flow Rate (L/min) 15 L/min  Assess: MEWS Score  MEWS Temp 0  MEWS Systolic 0  MEWS Pulse 0  MEWS RR 1  MEWS LOC 0  MEWS Score 1  MEWS Score Color Green  Assess: if the MEWS score is Yellow or Red  Were vital signs taken at a resting state? Yes  Focused Assessment No change from prior assessment  Early Detection of Sepsis Score *See Row Information* Low  MEWS guidelines implemented *See Row Information* No, other (Comment) (no acute changes)  Treat  Pain Scale 0-10  Pain Score 6  Pain Type Acute pain  Pain Location Abdomen  Pain Radiating Towards back  Pain Descriptors / Indicators Aching;Discomfort  Pain Frequency Intermittent  Pain Onset Gradual  Patients Stated Pain Goal 0  Pain Intervention(s) Medication (See eMAR)  Document  Progress note created (see row info) Yes

## 2020-09-15 LAB — COMPREHENSIVE METABOLIC PANEL
ALT: 24 U/L (ref 0–44)
AST: 45 U/L — ABNORMAL HIGH (ref 15–41)
Albumin: 2.5 g/dL — ABNORMAL LOW (ref 3.5–5.0)
Alkaline Phosphatase: 95 U/L (ref 38–126)
Anion gap: 9 (ref 5–15)
BUN: 12 mg/dL (ref 8–23)
CO2: 22 mmol/L (ref 22–32)
Calcium: 8.5 mg/dL — ABNORMAL LOW (ref 8.9–10.3)
Chloride: 99 mmol/L (ref 98–111)
Creatinine, Ser: 0.76 mg/dL (ref 0.44–1.00)
GFR calc Af Amer: >60 mL/min (ref >60)
GFR calc non Af Amer: >60 mL/min (ref >60)
Glucose, Bld: 154 mg/dL — ABNORMAL HIGH (ref 70–99)
Potassium: 4.2 mmol/L (ref 3.5–5.1)
Sodium: 130 mmol/L — ABNORMAL LOW (ref 135–145)
Total Bilirubin: 0.4 mg/dL (ref 0.3–1.2)
Total Protein: 5.8 g/dL — ABNORMAL LOW (ref 6.5–8.1)

## 2020-09-15 LAB — CBC WITH DIFFERENTIAL/PLATELET
Abs Immature Granulocytes: 0.05 10*3/uL (ref 0.00–0.07)
Basophils Absolute: 0 10*3/uL (ref 0.0–0.1)
Basophils Relative: 0 %
Eosinophils Absolute: 0 10*3/uL (ref 0.0–0.5)
Eosinophils Relative: 0 %
HCT: 30.2 % — ABNORMAL LOW (ref 36.0–46.0)
Hemoglobin: 10.2 g/dL — ABNORMAL LOW (ref 12.0–15.0)
Immature Granulocytes: 1 %
Lymphocytes Relative: 19 %
Lymphs Abs: 1.1 10*3/uL (ref 0.7–4.0)
MCH: 30.4 pg (ref 26.0–34.0)
MCHC: 33.8 g/dL (ref 30.0–36.0)
MCV: 89.9 fL (ref 80.0–100.0)
Monocytes Absolute: 0.4 10*3/uL (ref 0.1–1.0)
Monocytes Relative: 7 %
Neutro Abs: 4 10*3/uL (ref 1.7–7.7)
Neutrophils Relative %: 73 %
Platelets: 274 10*3/uL (ref 150–400)
RBC: 3.36 MIL/uL — ABNORMAL LOW (ref 3.87–5.11)
RDW: 14.6 % (ref 11.5–15.5)
WBC: 5.5 10*3/uL (ref 4.0–10.5)
nRBC: 0 % (ref 0.0–0.2)

## 2020-09-15 LAB — MAGNESIUM: Magnesium: 1.9 mg/dL (ref 1.7–2.4)

## 2020-09-15 LAB — FERRITIN: Ferritin: 424 ng/mL — ABNORMAL HIGH (ref 11–307)

## 2020-09-15 LAB — PHOSPHORUS: Phosphorus: 2.4 mg/dL — ABNORMAL LOW (ref 2.5–4.6)

## 2020-09-15 LAB — MRSA PCR SCREENING: MRSA by PCR: NEGATIVE

## 2020-09-15 LAB — D-DIMER, QUANTITATIVE: D-Dimer, Quant: 0.85 ug/mL-FEU — ABNORMAL HIGH (ref 0.00–0.50)

## 2020-09-15 LAB — C-REACTIVE PROTEIN: CRP: 12.1 mg/dL — ABNORMAL HIGH (ref <1.0)

## 2020-09-15 MED ORDER — LEVOTHYROXINE SODIUM 75 MCG PO TABS
112.5000 ug | ORAL_TABLET | ORAL | Status: DC
  Administered 2020-09-16 – 2020-09-20 (×2): 112.5 ug via ORAL
  Filled 2020-09-15 (×2): qty 2

## 2020-09-15 MED ORDER — ALPRAZOLAM 0.5 MG PO TABS
2.0000 mg | ORAL_TABLET | Freq: Two times a day (BID) | ORAL | Status: DC
  Administered 2020-09-15 – 2020-09-17 (×5): 2 mg via ORAL
  Filled 2020-09-15 (×5): qty 4

## 2020-09-15 MED ORDER — SERTRALINE HCL 100 MG PO TABS
100.0000 mg | ORAL_TABLET | Freq: Two times a day (BID) | ORAL | Status: DC
  Administered 2020-09-15 – 2020-10-07 (×33): 100 mg via ORAL
  Filled 2020-09-15 (×38): qty 1

## 2020-09-15 MED ORDER — METHOCARBAMOL 750 MG PO TABS: 750.0000 mg | ORAL_TABLET | Freq: Three times a day (TID) | ORAL | Status: DC | PRN

## 2020-09-15 MED ORDER — LEVOTHYROXINE SODIUM 75 MCG PO TABS
75.0000 ug | ORAL_TABLET | ORAL | Status: DC
  Administered 2020-09-17 – 2020-09-21 (×4): 75 ug via ORAL
  Filled 2020-09-15 (×4): qty 1

## 2020-09-15 MED ORDER — ATORVASTATIN CALCIUM 10 MG PO TABS
10.0000 mg | ORAL_TABLET | Freq: Every day | ORAL | Status: DC
  Administered 2020-09-15 – 2020-09-28 (×8): 10 mg via ORAL
  Filled 2020-09-15 (×10): qty 1

## 2020-09-15 MED ORDER — PHENOL 1.4 % MT LIQD
1.0000 | OROMUCOSAL | Status: DC | PRN
  Administered 2020-09-15 – 2020-09-24 (×2): 1 via OROMUCOSAL
  Filled 2020-09-15: qty 177

## 2020-09-15 MED ORDER — LAMOTRIGINE 100 MG PO TABS
100.0000 mg | ORAL_TABLET | Freq: Every day | ORAL | Status: DC
  Administered 2020-09-15 – 2020-09-28 (×8): 100 mg via ORAL
  Filled 2020-09-15 (×10): qty 1

## 2020-09-15 MED ORDER — OXYCODONE HCL 5 MG PO TABS
10.0000 mg | ORAL_TABLET | Freq: Two times a day (BID) | ORAL | Status: DC | PRN
  Administered 2020-09-20: 10 mg via ORAL
  Filled 2020-09-15: qty 2

## 2020-09-15 NOTE — Progress Notes (Signed)
   09/15/20 2035  Assess: MEWS Score  Temp 98.1 F (36.7 C)  BP 123/68  Pulse Rate 83  ECG Heart Rate 84  Resp (!) 36  Level of Consciousness Alert  SpO2 (!) 89 %  O2 Device HFNC  Patient Activity (if Appropriate) In bed  O2 Flow Rate (L/min) 10 L/min  Assess: MEWS Score  MEWS Temp 0  MEWS Systolic 0  MEWS Pulse 0  MEWS RR 3  MEWS LOC 0  MEWS Score 3  MEWS Score Color Yellow  Assess: if the MEWS score is Yellow or Red  Were vital signs taken at a resting state? Yes  Focused Assessment No change from prior assessment  Early Detection of Sepsis Score *See Row Information* Low  MEWS guidelines implemented *See Row Information* Yes  Treat  Pain Scale 0-10  Pain Score 0  Take Vital Signs  Increase Vital Sign Frequency  Yellow: Q 2hr X 2 then Q 4hr X 2, if remains yellow, continue Q 4hrs  Escalate  MEWS: Escalate Yellow: discuss with charge nurse/RN and consider discussing with provider and RRT  Notify: Charge Nurse/RN  Name of Charge Nurse/RN Notified Carrie Mew, RN  Date Charge Nurse/RN Notified 09/15/20  Time Charge Nurse/RN Notified 2041  Notify: Provider  Provider Name/Title n/a  Notify: Rapid Response  Name of Rapid Response RN Notified n/a  Document  Progress note created (see row info) Yes   Pt fired yellow MEWS d/t elevated RR.  Pt does endorse mild SOB but is otherwise in no acute distress.  O2 increased to 10L after sats in the low 80's noted.  Will monitor per yellow MEWS protocol.

## 2020-09-15 NOTE — Plan of Care (Signed)
  Problem: Education: Goal: Knowledge of risk factors and measures for prevention of condition will improve Outcome: Progressing   Problem: Respiratory: Goal: Will maintain a patent airway Outcome: Progressing   Problem: Education: Goal: Knowledge of General Education information will improve Description: Including pain rating scale, medication(s)/side effects and non-pharmacologic comfort measures Outcome: Progressing   Problem: Health Behavior/Discharge Planning: Goal: Ability to manage health-related needs will improve Outcome: Progressing   Problem: Clinical Measurements: Goal: Respiratory complications will improve Outcome: Progressing   Problem: Activity: Goal: Risk for activity intolerance will decrease Outcome: Progressing   Problem: Nutrition: Goal: Adequate nutrition will be maintained Outcome: Progressing   

## 2020-09-15 NOTE — Progress Notes (Signed)
PROGRESS NOTE                                                                                                                                                                                                             Patient Demographics:    April Brewer, is a 74 y.o. female, DOB - 04/29/1946, DEY:[id number]  Outpatient Primary MD for the patient is McGowen, Adrian Blackwater, MD   Admit date - 09/19/2020   LOS - 1  Chief Complaint  Patient presents with  . Shortness of Breath       Brief Narrative: Patient is a 74 y.o. female with PMHx of HLD, hypothyroidism, GERD, lymphocytic colitis, anxiety/depression, chronic back pain on narcotics-presented with several days history of shortness of breath-found to have acute hypoxic respiratory failure secondary to COVID-19 pneumonia.  COVID-19 vaccinated status: Vaccinated  Significant Events: 9/18>> Admit to [facility] for severe hypoxia due to COVID-19 pneumonia  Significant studies: 9/17>>Chest x-ray: Diffuse interstitial and groundglass opacities  COVID-19 medications: Steroids:9/17>> Remdesivir: 9/17>> Baricitinib: 9/18>>  Antibiotics: Rocephin: 9/18 x 1 Zithromax: 9/18 x 1  Microbiology data: 9/17 >>blood culture: No growth  Procedures: None  Consults: None  DVT prophylaxis: enoxaparin (LOVENOX) injection 40 mg Start: 09/14/20 1000   Subjective:    Geradine Girt today remains stable at rest-on 10 L of HFNC.   Assessment  & Plan :   Acute Hypoxic Resp Failure due to Covid 19 Viral pneumonia: Appears comfortable-on 10 L of HFNC-plans are to continue with steroids/remdesivir and baricitinib.  Do not think patient has bacterial pneumonia-we'll go and stop a Rocephin/Zithromax..  Although with severe hypoxemia-she appears comfortable-stable for close monitoring.  Note-use of baricitinib on the EUA by FDA discussed with patient.  Rationale/adverse effects/benefits  discussed with patient.  She understands risk of opportunistic infections, VTE.  She consents to the continued use of baricitinib.  Fever: afebrile O2 requirements:  SpO2: 92 % O2 Flow Rate (L/min): 10 L/min   COVID-19 Labs: Recent Labs    09/26/2020 2208 09/15/20 0324  DDIMER 1.04* 0.85*  FERRITIN 624* 424*  LDH 386*  --   CRP 16.4* 12.1*       Component Value Date/Time   BNP 229.9 (H) 09/14/2020 1305    Recent Labs  Lab 09/19/2020 2208  PROCALCITON 0.16    Lab Results  Component Value Date  SARSCOV2NAA POSITIVE (A) 09/09/2020   SARSCOV2NAA NOT DETECTED 06/06/2019     Prone/Incentive Spirometry: encouraged patient to lie prone for 3-4 hours at a time for a total of 16 hours a day, and to encourage incentive spirometry use 3-4/hour.  Transaminitis: Secondary to COVID-19-mild-stable for further work-up.  Hyponatremia: Better-no signs of dehydration excess volume-could have underlying SIADH due to pneumonia.  Continue to monitor-since mild-we could monitor without performing any further work-up.  HLD: Continue statin  Hypothyroidism: Continue Synthroid  Anxiety/depression: Appears stable-resume Xanax, Zoloft and lamotrigine.  Chronic back pain: Resume as needed narcotics  GI prophylaxis:H2 Blocker  ABG:    Component Value Date/Time   TCO2 22 02/03/2015 0326    Vent Settings: N/A  Condition - Extremely Guarded  Family Communication  :  Son [name] [phone]) updated over the phone 9/19  Code Status :  Full Code  Diet :  Diet Order            Diet regular Room service appropriate? Yes; Fluid consistency: Thin  Diet effective now                  Disposition Plan  :   Status is: Inpatient  Remains inpatient appropriate because:Inpatient level of care appropriate due to severity of illness   Dispo: The patient is from: Home              Anticipated d/c is to: Home              Anticipated d/c date is: > 3 days              Patient  currently is not medically stable to d/c.  Barriers to discharge: Hypoxia requiring O2 supplementation/complete 5 days of IV Remdesivir  Antimicorbials  :    Anti-infectives (From admission, onward)   Start     Dose/Rate Route Frequency Ordered Stop   09/14/20 2200  cefTRIAXone (ROCEPHIN) 2 g in sodium chloride 0.9 % 100 mL IVPB        2 g 200 mL/hr over 30 Minutes Intravenous Every 24 hours 09/14/20 2043     09/14/20 2200  azithromycin (ZITHROMAX) 500 mg in sodium chloride 0.9 % 250 mL IVPB        500 mg 250 mL/hr over 60 Minutes Intravenous Every 24 hours 09/14/20 2043     09/14/20 1000  remdesivir 100 mg in sodium chloride 0.9 % 100 mL IVPB       "Followed by" Linked Group Details   100 mg 200 mL/hr over 30 Minutes Intravenous Daily 09/14/2020 2306 09/18/20 0959   09/26/2020 2330  remdesivir 100 mg in sodium chloride 0.9 % 100 mL IVPB       "Followed by" Linked Group Details   100 mg 200 mL/hr over 30 Minutes Intravenous Every 30 min 09/08/2020 2306 09/14/20 0033      Inpatient Medications  Scheduled Meds: . acidophilus  1 capsule Oral Daily  . albuterol  2 puff Inhalation Q6H  . vitamin C  500 mg Oral Daily  . baricitinib  4 mg Oral Q24H  . enoxaparin (LOVENOX) injection  40 mg Subcutaneous Q24H  . famotidine  20 mg Oral BID  . feeding supplement (ENSURE ENLIVE)  237 mL Oral BID BM  . mouth rinse  15 mL Mouth Rinse BID  . methylPREDNISolone (SOLU-MEDROL) injection  0.5 mg/kg Intravenous Q12H   Followed by  . [START ON 09/17/2020] predniSONE  50 mg Oral Daily  . sodium  chloride flush  3 mL Intravenous Q12H  . zinc sulfate  220 mg Oral Daily   Continuous Infusions: . sodium chloride Stopped (09/15/20 0004)  . sodium chloride Stopped (09/14/20 2233)  . azithromycin Stopped (09/14/20 2336)  . cefTRIAXone (ROCEPHIN)  IV Stopped (09/14/20 2157)  . remdesivir 100 mg in NS 100 mL Stopped (09/14/20 0905)   PRN Meds:.sodium chloride, sodium chloride, acetaminophen,  chlorpheniramine-HYDROcodone, guaiFENesin-dextromethorphan, ondansetron **OR** ondansetron (ZOFRAN) IV, phenol   Time Spent in minutes  35    See all Orders from today for further details   Oren Binet M.D on 09/15/2020 at 10:52 AM  To page go to [email or web address] - use universal password  [facility] -  Office  [phone]    Objective:   Vitals:   09/15/20 0200 09/15/20 0400 09/15/20 0600 09/15/20 0733  BP: 107/66 107/67 114/69 115/73  Pulse: 71 68 69 71  Resp: 18 (!) 28 20 18   Temp: 98.2 F (36.8 C) 97.9 F (36.6 C) 98 F (36.7 C) 98.4 F (36.9 C)  TempSrc: Oral Oral Oral Oral  SpO2: 95% 98% 97% 92%  Weight:      Height:        Wt Readings from Last 3 Encounters:  09/01/2020 66.2 kg  09/04/20 69.2 kg  07/24/20 69.7 kg     Intake/Output Summary (Last 24 hours) at 09/15/2020 1052 Last data filed at 09/15/2020 0426 Gross per 24 hour  Intake 505.7 ml  Output --  Net 505.7 ml     Physical Exam Gen Exam:Alert awake-not in any distress HEENT:atraumatic, normocephalic Chest: B/L clear to auscultation anteriorly CVS:S1S2 regular Abdomen:soft non tender, non distended Extremities:no edema Neurology: Non focal Skin: no rash   Data Review:    CBC Recent Labs  Lab 09/11/2020 2208 09/15/20 0324  WBC 5.3 5.5  HGB 11.1* 10.2*  HCT 32.1* 30.2*  PLT 247 274  MCV 88.9 89.9  MCH 30.7 30.4  MCHC 34.6 33.8  RDW 14.2 14.6  LYMPHSABS 0.8 1.1  MONOABS 0.4 0.4  EOSABS 0.0 0.0  BASOSABS 0.0 0.0    Chemistries  Recent Labs  Lab 09/12/2020 2208 09/14/20 1305 09/15/20 0324  NA 126* 132* 130*  K 3.4* 3.7 4.2  CL 92* 100 99  CO2 23 22 22   GLUCOSE 124* 172* 154*  BUN 11 8 12   CREATININE 0.70 0.84 0.76  CALCIUM 8.2* 8.5* 8.5*  MG  --  1.6* 1.9  AST 64*  --  45*  ALT 27  --  24  ALKPHOS 107  --  95  BILITOT 0.4  --  0.4   ------------------------------------------------------------------------------------------------------------------ Recent Labs     09/25/2020 2208  TRIG 73    No results found for: HGBA1C ------------------------------------------------------------------------------------------------------------------ No results for input(s): TSH, T4TOTAL, T3FREE, THYROIDAB in the last 72 hours.  Invalid input(s): FREET3 ------------------------------------------------------------------------------------------------------------------ Recent Labs    09/23/2020 2208 09/15/20 0324  FERRITIN 624* 424*    Coagulation profile No results for input(s): INR, PROTIME in the last 168 hours.  Recent Labs    09/07/2020 2208 09/15/20 0324  DDIMER 1.04* 0.85*    Cardiac Enzymes No results for input(s): CKMB, TROPONINI, MYOGLOBIN in the last 168 hours.  Invalid input(s): CK ------------------------------------------------------------------------------------------------------------------    Component Value Date/Time   BNP 229.9 (H) 09/14/2020 1305    Micro Results Recent Results (from the past 240 hour(s))  Blood Culture (routine x 2)     Status: None (Preliminary result)   Collection Time: 09/06/2020 10:00 PM   Specimen:  BLOOD  Result Value Ref Range Status   Specimen Description   Final    BLOOD LEFT ANTECUBITAL Performed at [facility], [place] [address], [place], [place] [address]    Special Requests   Final    BOTTLES DRAWN AEROBIC AND ANAEROBIC Blood Culture adequate volume Performed at [facility], [address], [place], [place] [address]    Culture   Final    NO GROWTH < 24 HOURS Performed at [facility], [place] [address], [place], [place] [address]    Report Status PENDING  Incomplete  SARS Coronavirus 2 by RT PCR (hospital order, performed in [facility] hospital lab) Nasopharyngeal Nasopharyngeal Swab     Status: Abnormal   Collection Time: 09/01/2020 10:07 PM   Specimen: Nasopharyngeal Swab  Result Value Ref Range Status   SARS Coronavirus 2 POSITIVE (A) NEGATIVE Final    Comment: RESULT  CALLED TO, READ BACK BY AND VERIFIED WITH: NEAL,K, RN @ 1975 09/04/2020 BY GWYN,P (NOTE) SARS-CoV-2 target nucleic acids are DETECTED  SARS-CoV-2 RNA is generally detectable in upper respiratory specimens  during the acute phase of infection.  Positive results are indicative  of the presence of the identified virus, but do not rule out bacterial infection or co-infection with other pathogens not detected by the test.  Clinical correlation with patient history and  other diagnostic information is necessary to determine patient infection status.  The expected result is negative.  Fact Sheet for Patients:   StrictlyIdeas.no   Fact Sheet for Healthcare Providers:   BankingDealers.co.za    This test is not yet approved or cleared by the [place] FDA and  has been authorized for detection and/or diagnosis of SARS-CoV-2 by FDA under an Emergency Use Authorization (EUA).  This EUA will remain in effect (meaning this t est can be used) for the duration of  the COVID-19 declaration under Section 564(b)(1) of the Act, 21 U.S.C. section 360-bbb-3(b)(1), unless the authorization is terminated or revoked sooner.  Performed at [facility], [address] [place], [place] [address]   MRSA PCR Screening     Status: None   Collection Time: 09/15/20 12:14 AM   Specimen: Nasal Mucosa; Nasopharyngeal  Result Value Ref Range Status   MRSA by PCR NEGATIVE NEGATIVE Final    Comment:        The GeneXpert MRSA Assay (FDA approved for NASAL specimens only), is one component of a comprehensive MRSA colonization surveillance program. It is not intended to diagnose MRSA infection nor to guide or monitor treatment for MRSA infections. Performed at [facility], [place] [address] [place], [place] [address]     Radiology Reports DG Chest Portable 1 View  Result Date: 09/04/2020 CLINICAL DATA:  Cough, congestion, fever, loss of taste  and smell for 1 week EXAM: PORTABLE CHEST 1 VIEW COMPARISON:  01/11/2014 FINDINGS: Single frontal view of the chest demonstrates stable enlarged cardiac silhouette. There is diffuse increased interstitial prominence with bilateral ground-glass consolidation. No effusion or pneumothorax. No acute bony abnormalities. Bilateral shoulder arthroplasties. IMPRESSION: 1. Diffuse interstitial and ground-glass opacities, consistent with bilateral atypical pneumonia given clinical history. Electronically Signed   By: Randa Ngo M.D.   On: 09/06/2020 23:33

## 2020-09-15 NOTE — Evaluation (Signed)
Physical Therapy Evaluation Patient Details Name: April Brewer MRN: [id number] DOB: [birth year] Today's Date: 09/15/2020   History of Present Illness  74 y.o. female with medical history significant of hyperlipidemia, hypothyroidism, GERD, lymphocytic colitis, anxiety, and depression presented with complaints of shortness of breath and cough over the last 2 - 3 days. Noted associated symptoms of fever, loss of taste/smell, poor appetite, nausea, generalized weakness, and diarrhea. COVID-19 screening was positive.   Clinical Impression   Pt admitted with above diagnosis. Patient seen after recent return to bed after up in chair since breakfast and she did not want to get OOB again today. Educated on importance of and correct use of flutter valve and IS. Educated in role of increased activity (including positioning in upright sitting, ROM exercises, and eventually walking). She reports she was modified independent with her walking with straight cane (outdoors and sometimes uses indoors as well). She has an aide that comes for several hours per day, 5 days/week to assist with "chores."  Pt currently with functional limitations due to the deficits listed below (see PT Problem List). Pt will benefit from skilled PT to increase their independence and safety with mobility to allow discharge to the venue listed below.       Follow Up Recommendations Other (comment) (TBA with mobility assessment) Discussed potential need for SNF as she does not have 24/7 care.     Equipment Recommendations  None recommended by PT    Recommendations for Other Services OT consult     Precautions / Restrictions Precautions Precautions: Fall Precaution Comments: syncope with fall several weeks ago      Mobility  Bed Mobility Overal bed mobility: Needs Assistance Bed Mobility: Supine to Sit     Supine to sit: Min assist;HOB elevated     General bed mobility comments: pt wanted to come to long-sitting to sip  her drink and then scoot her hips up in bed  Transfers                 General transfer comment: pt just back to bed after up since breakfast (per pt); deferred at pt request due to fatigue  Ambulation/Gait                Stairs            Wheelchair Mobility    Modified Rankin (Stroke Patients Only)       Balance                                             Pertinent Vitals/Pain Pain Assessment: No/denies pain    Home Living Family/patient expects to be discharged to:: Private residence Living Arrangements: Children Available Help at Discharge: Family;Personal care attendant;Available PRN/intermittently (aide Mon-Fri several hours each afternoon) Type of Home: House Home Access: Level entry     Home Layout: Two level Home Equipment: Walker - 2 wheels;Cane - single point;Bedside commode      Prior Function Level of Independence: Needs assistance   Gait / Transfers Assistance Needed: sometimes uses cane inside, always uses to go to mailbox  ADL's / Homemaking Assistance Needed: aide or son does laundry,cleaning        Hand Dominance        Extremity/Trunk Assessment   Upper Extremity Assessment Upper Extremity Assessment: Defer to OT evaluation    Lower Extremity Assessment Lower Extremity  Assessment: Generalized weakness (rt knee extension 3+ 2/2 pain from recent fall)       Communication   Communication: No difficulties  Cognition Arousal/Alertness: Awake/alert Behavior During Therapy: WFL for tasks assessed/performed Overall Cognitive Status: Within Functional Limits for tasks assessed                                        General Comments General comments (skin integrity, edema, etc.): On arrival on 10L HFNC with sats 91%; noted [place] not fully in her nose with sats increasing to 97% once corrected. Lowest sats 90% with ROM exercises, breathing exercises and pull to long-sitting.      Exercises Other Exercises Other Exercises: Educated on use of flutter valve and IS. Patient with difficulty coordinating correct breath with each device. With max cues only able to pull 500 ml with IS. Multiple reps of each attempted for education.  Other Exercises: ankle pumps x 20; heelslides x 5 each leg   Assessment/Plan    PT Assessment Patient needs continued PT services  PT Problem List Decreased strength;Decreased activity tolerance;Decreased balance;Decreased mobility;Decreased knowledge of use of DME;Decreased knowledge of precautions;Cardiopulmonary status limiting activity       PT Treatment Interventions DME instruction;Gait training;Functional mobility training;Therapeutic activities;Therapeutic exercise;Patient/family education    PT Goals (Current goals can be found in the Care Plan section)  Acute Rehab PT Goals Patient Stated Goal: be able to go home from hospital PT Goal Formulation: With patient Time For Goal Achievement: 09/29/20 Potential to Achieve Goals: Good    Frequency Min 3X/week   Barriers to discharge Decreased caregiver support son works 1:00 am to 2-3:00 pm;    Co-evaluation               AM-PAC PT "6 Clicks" Mobility  Outcome Measure Help needed turning from your back to your side while in a flat bed without using bedrails?: A Little Help needed moving from lying on your back to sitting on the side of a flat bed without using bedrails?: A Little Help needed moving to and from a bed to a chair (including a wheelchair)?: A Lot Help needed standing up from a chair using your arms (e.g., wheelchair or bedside chair)?: A Lot Help needed to walk in hospital room?: Total Help needed climbing 3-5 steps with a railing? : Total 6 Click Score: 12    End of Session Equipment Utilized During Treatment: Oxygen Activity Tolerance: Patient tolerated treatment well Patient left: in bed;with call bell/phone within reach;with bed alarm set   PT Visit  Diagnosis: Muscle weakness (generalized) (M62.81);Difficulty in walking, not elsewhere classified (R26.2)    Time: 1610-9604 PT Time Calculation (min) (ACUTE ONLY): 31 min   Charges:   PT Evaluation $PT Eval Low Complexity: 1 Low PT Treatments $Therapeutic Exercise: 8-22 mins         Arby Barrette, PT Pager [phone]   Rexanne Mano 09/15/2020, 5:28 PM

## 2020-09-15 NOTE — Progress Notes (Signed)
   09/15/20 0004  Assess: MEWS Score  Temp 98 F (36.7 C)  BP 119/69  Pulse Rate 71  ECG Heart Rate 71  Resp (!) 28  Level of Consciousness Alert  SpO2 99 %  O2 Device HFNC  Patient Activity (if Appropriate) In bed  O2 Flow Rate (L/min) 15 L/min  Assess: MEWS Score  MEWS Temp 0  MEWS Systolic 0  MEWS Pulse 0  MEWS RR 2  MEWS LOC 0  MEWS Score 2  MEWS Score Color Yellow  Assess: if the MEWS score is Yellow or Red  Were vital signs taken at a resting state? Yes  Focused Assessment No change from prior assessment  Early Detection of Sepsis Score *See Row Information* Low  MEWS guidelines implemented *See Row Information* Yes  Treat  MEWS Interventions Other (Comment) (initiated yellow protocol)  Take Vital Signs  Increase Vital Sign Frequency  Yellow: Q 2hr X 2 then Q 4hr X 2, if remains yellow, continue Q 4hrs  Escalate  MEWS: Escalate Yellow: discuss with charge nurse/RN and consider discussing with provider and RRT  Notify: Charge Nurse/RN  Name of Charge Nurse/RN Notified Carrie Mew, RN  Date Charge Nurse/RN Notified 09/15/20  Time Charge Nurse/RN Notified 0019  Notify: Provider  Provider Name/Title n/a  Notify: Rapid Response  Name of Rapid Response RN Notified n/a  Document  Progress note created (see row info) Yes   Pt scored yellow MEWS d/t elevated RR.  On assessment, pt is comfortable and in no acute distress.  Charge RN notified, yellow MEWS protocol initiated.

## 2020-09-16 LAB — FERRITIN: Ferritin: 384 ng/mL — ABNORMAL HIGH (ref 11–307)

## 2020-09-16 LAB — COMPREHENSIVE METABOLIC PANEL
ALT: 23 U/L (ref 0–44)
AST: 39 U/L (ref 15–41)
Albumin: 2.6 g/dL — ABNORMAL LOW (ref 3.5–5.0)
Alkaline Phosphatase: 102 U/L (ref 38–126)
Anion gap: 8 (ref 5–15)
BUN: 13 mg/dL (ref 8–23)
CO2: 24 mmol/L (ref 22–32)
Calcium: 8.7 mg/dL — ABNORMAL LOW (ref 8.9–10.3)
Chloride: 101 mmol/L (ref 98–111)
Creatinine, Ser: 0.89 mg/dL (ref 0.44–1.00)
GFR calc Af Amer: >60 mL/min (ref >60)
GFR calc non Af Amer: >60 mL/min (ref >60)
Glucose, Bld: 119 mg/dL — ABNORMAL HIGH (ref 70–99)
Potassium: 4.6 mmol/L (ref 3.5–5.1)
Sodium: 133 mmol/L — ABNORMAL LOW (ref 135–145)
Total Bilirubin: 0.5 mg/dL (ref 0.3–1.2)
Total Protein: 6 g/dL — ABNORMAL LOW (ref 6.5–8.1)

## 2020-09-16 LAB — CBC WITH DIFFERENTIAL/PLATELET
Abs Immature Granulocytes: 0.05 10*3/uL (ref 0.00–0.07)
Basophils Absolute: 0 10*3/uL (ref 0.0–0.1)
Basophils Relative: 0 %
Eosinophils Absolute: 0 10*3/uL (ref 0.0–0.5)
Eosinophils Relative: 0 %
HCT: 31.5 % — ABNORMAL LOW (ref 36.0–46.0)
Hemoglobin: 10.6 g/dL — ABNORMAL LOW (ref 12.0–15.0)
Immature Granulocytes: 1 %
Lymphocytes Relative: 18 %
Lymphs Abs: 1.5 10*3/uL (ref 0.7–4.0)
MCH: 30.8 pg (ref 26.0–34.0)
MCHC: 33.7 g/dL (ref 30.0–36.0)
MCV: 91.6 fL (ref 80.0–100.0)
Monocytes Absolute: 0.7 10*3/uL (ref 0.1–1.0)
Monocytes Relative: 8 %
Neutro Abs: 6 10*3/uL (ref 1.7–7.7)
Neutrophils Relative %: 73 %
Platelets: 357 10*3/uL (ref 150–400)
RBC: 3.44 MIL/uL — ABNORMAL LOW (ref 3.87–5.11)
RDW: 15 % (ref 11.5–15.5)
WBC: 8.2 10*3/uL (ref 4.0–10.5)
nRBC: 0 % (ref 0.0–0.2)

## 2020-09-16 LAB — MAGNESIUM: Magnesium: 1.8 mg/dL (ref 1.7–2.4)

## 2020-09-16 LAB — D-DIMER, QUANTITATIVE: D-Dimer, Quant: 1.28 ug/mL-FEU — ABNORMAL HIGH (ref 0.00–0.50)

## 2020-09-16 LAB — C-REACTIVE PROTEIN: CRP: 7.5 mg/dL — ABNORMAL HIGH (ref <1.0)

## 2020-09-16 MED ORDER — METHYLPREDNISOLONE SODIUM SUCC 125 MG IJ SOLR
70.0000 mg | Freq: Two times a day (BID) | INTRAMUSCULAR | Status: DC
  Administered 2020-09-16 – 2020-09-23 (×14): 70 mg via INTRAVENOUS
  Filled 2020-09-16 (×14): qty 2

## 2020-09-16 NOTE — Progress Notes (Signed)
Physical Therapy Treatment Patient Details Name: April Brewer MRN: [id number] DOB: 03-Dec-1946 Today's Date: 09/16/2020    History of Present Illness Pt is 74 y.o. female with medical history significant of hyperlipidemia, hypothyroidism, GERD, lymphocytic colitis, anxiety, and depression presented with complaints of shortness of breath and cough over the last 2 - 3 days. Noted associated symptoms of fever, loss of taste/smell, poor appetite, nausea, generalized weakness, and diarrhea. COVID-19 screening was positive.  Admitted with hypoxic resp failure due to COVID PNE.    PT Comments    Pt demonstrating good progress today.  She was on 15 L HFNC but was able to stand and march in place for 1 min for 3 reps with 3 min rest break.  Sats dropped to 84% but recovered in 1.5 minutes.  Pt is very pleasant and likes to talk - cues to focus on breathing with activity and recovery.  She was cued/educated on breathing techniques and posture to promote improve lung aeration.    Follow Up Recommendations  Home health PT;Supervision - Intermittent     Equipment Recommendations  None recommended by PT    Recommendations for Other Services       Precautions / Restrictions Precautions Precautions: Fall Precaution Comments: syncope with fall several weeks ago    Mobility  Bed Mobility               General bed mobility comments: In recliner upon arrival  Transfers Overall transfer level: Needs assistance Equipment used: Rolling walker (2 wheeled) Transfers: Sit to/from Stand Sit to Stand: Min guard         General transfer comment: Min guard for safety; cues to lean forward and push up with hands; performed x 3  Ambulation/Gait Ambulation/Gait assistance: Min guard   Assistive device: Rolling walker (2 wheeled)   Gait velocity: decreased   General Gait Details: Marched in place 1 min 3 x with 3 minute rest breaks.  Min guard for safety and cues to focus on breathing with  activity and for posture   Stairs             Wheelchair Mobility    Modified Rankin (Stroke Patients Only)       Balance Overall balance assessment: Needs assistance Sitting-balance support: Feet supported;No upper extremity supported Sitting balance-Leahy Scale: Good     Standing balance support: During functional activity;Bilateral upper extremity supported;No upper extremity supported Standing balance-Leahy Scale: Fair Standing balance comment: Used RW for marching but was able to lift hands for short time                            Cognition Arousal/Alertness: Awake/alert Behavior During Therapy: WFL for tasks assessed/performed Overall Cognitive Status: Within Functional Limits for tasks assessed                                        Exercises      General Comments General comments (skin integrity, edema, etc.): Pt on 15 L HFNC with O2 sats 96% rest; dropped to 89% marching, 84%during recovery, took 1.5 mins to recover to 90% and 3 Mins to recover to 96%.  Pt educated on breathing technique and posture throughout session.  She tends to hike rounded shoulders.  Had pt perform scapular retraction with tactile cues x 10.  Also did incentive spirometer and flutter valve x  10.      Pertinent Vitals/Pain Pain Assessment: No/denies pain    Home Living                      Prior Function            PT Goals (current goals can now be found in the care plan section) Acute Rehab PT Goals Patient Stated Goal: be able to go home from hospital PT Goal Formulation: With patient Time For Goal Achievement: 09/29/20 Potential to Achieve Goals: Good Progress towards PT goals: Progressing toward goals    Frequency    Min 3X/week      PT Plan Current plan remains appropriate    Co-evaluation              AM-PAC PT "6 Clicks" Mobility   Outcome Measure  Help needed turning from your back to your side while in a flat  bed without using bedrails?: A Little Help needed moving from lying on your back to sitting on the side of a flat bed without using bedrails?: A Little Help needed moving to and from a bed to a chair (including a wheelchair)?: A Little Help needed standing up from a chair using your arms (e.g., wheelchair or bedside chair)?: A Little Help needed to walk in hospital room?: A Little Help needed climbing 3-5 steps with a railing? : A Little 6 Click Score: 18    End of Session Equipment Utilized During Treatment: Oxygen Activity Tolerance: Patient tolerated treatment well Patient left: with call bell/phone within reach;in chair Nurse Communication: Mobility status PT Visit Diagnosis: Muscle weakness (generalized) (M62.81);Difficulty in walking, not elsewhere classified (R26.2)     Time: 0354-6568 PT Time Calculation (min) (ACUTE ONLY): 20 min  Charges:  $Therapeutic Activity: 8-22 mins                     Abran Richard, PT Acute Rehab Services Pager [phone] [facility] 09/16/2020, 1:36 PM

## 2020-09-16 NOTE — Evaluation (Signed)
Occupational Therapy Evaluation Patient Details Name: April Brewer MRN: [id number] DOB: 1946/07/29 Today's Date: 09/16/2020    History of Present Illness 74 y.o. female with medical history significant of hyperlipidemia, hypothyroidism, GERD, lymphocytic colitis, anxiety, and depression presented with complaints of shortness of breath and cough over the last 2 - 3 days. Noted associated symptoms of fever, loss of taste/smell, poor appetite, nausea, generalized weakness, and diarrhea. COVID-19 screening was positive.    Clinical Impression   PTA, pt was living with her son and was independent with ADLs and simple IADLs; using cane as needed. Pt currently requiring Min A for LB ADLs and functional mobility with RW. Pt presenting with decreased activity tolerance requiring seated rest breaks. Pt performing oral care at sink with sitting rest breaks. SpO2 dropping to 73% during activity.  Cues for purse lip breathing and able to elevate back to >87% on 15L via HFNC. Pt would benefit from further acute OT to facilitate safe dc. Recommend dc to home with HHOT for further OT to optimize safety, independence with ADLs, and return to PLOF.   SpO2 87 on 15L via HFNC. HR 90s. BP 120/70. SpO2 dropping to 73% during mobility.    Follow Up Recommendations  Home health OT;Supervision/Assistance - 24 hour    Equipment Recommendations  None recommended by OT    Recommendations for Other Services PT consult     Precautions / Restrictions Precautions Precautions: Fall Precaution Comments: syncope with fall several weeks ago      Mobility Bed Mobility               General bed mobility comments: In recliner upon arrival  Transfers Overall transfer level: Needs assistance Equipment used: Rolling walker (2 wheeled) Transfers: Sit to/from Stand Sit to Stand: Min assist         General transfer comment: Min A to power up into standing and weight shift forward    Balance Overall balance  assessment: Needs assistance Sitting-balance support: Feet supported;No upper extremity supported Sitting balance-Leahy Scale: Good     Standing balance support: No upper extremity supported;During functional activity Standing balance-Leahy Scale: Fair Standing balance comment: Used RW for marching but was able to lift hands for short time                           ADL either performed or assessed with clinical judgement   ADL Overall ADL's : Needs assistance/impaired Eating/Feeding: Set up;Sitting   Grooming: Oral care;Min guard;Standing;Sitting Grooming Details (indicate cue type and reason): Performing oral care at sink. SpO2 86-82%. Sitting down to recover O2.  Upper Body Bathing: Supervision/ safety;Set up;Sitting   Lower Body Bathing: Minimal assistance;Sit to/from stand   Upper Body Dressing : Supervision/safety;Set up;Sitting   Lower Body Dressing: Minimal assistance;Sit to/from stand   Toilet Transfer: Minimal assistance;Ambulation;RW (simulated to recliner)   Toileting- Clothing Manipulation and Hygiene: Minimal assistance;Sit to/from stand       Functional mobility during ADLs: Min guard;Minimal assistance;Rolling walker General ADL Comments: Pt presenting with decrerased activity tolerance     Vision Baseline Vision/History: Wears glasses Wears Glasses: Reading only Patient Visual Report: No change from baseline       Perception     Praxis      Pertinent Vitals/Pain Pain Assessment: No/denies pain     Hand Dominance Right   Extremity/Trunk Assessment Upper Extremity Assessment Upper Extremity Assessment: Overall WFL for tasks assessed   Lower Extremity Assessment Lower Extremity  Assessment: Defer to PT evaluation   Cervical / Trunk Assessment Cervical / Trunk Assessment: Normal   Communication Communication Communication: No difficulties   Cognition Arousal/Alertness: Awake/alert Behavior During Therapy: WFL for tasks  assessed/performed Overall Cognitive Status: Within Functional Limits for tasks assessed                                     General Comments  SpO2 87 on 15L via HFNC. HR 90s. BP 120/70. SpO2 dropping to 73% during mobility. Seated rest break    Exercises     Shoulder Instructions      Home Living Family/patient expects to be discharged to:: Private residence Living Arrangements: Children (Son) Available Help at Discharge: Family;Personal care attendant;Available PRN/intermittently Type of Home: House Home Access: Level entry     Home Layout: Two level Alternate Level Stairs-Number of Steps: 3 Alternate Level Stairs-Rails: Right Bathroom Shower/Tub: Tub/shower unit   Bathroom Toilet: Handicapped height     Home Equipment: Environmental consultant - 2 wheels;Cane - single point;Bedside commode          Prior Functioning/Environment Level of Independence: Needs assistance  Gait / Transfers Assistance Needed: sometimes uses cane inside, always uses to go to mailbox ADL's / Homemaking Assistance Needed: aide or son does laundry,cleaning            OT Problem List: Decreased strength;Decreased range of motion;Decreased activity tolerance;Impaired balance (sitting and/or standing);Decreased knowledge of use of DME or AE;Decreased knowledge of precautions;Cardiopulmonary status limiting activity      OT Treatment/Interventions: Self-care/ADL training;Therapeutic exercise;Energy conservation;DME and/or AE instruction;Therapeutic activities;Patient/family education    OT Goals(Current goals can be found in the care plan section) Acute Rehab OT Goals Patient Stated Goal: be able to go home from hospital OT Goal Formulation: With patient Time For Goal Achievement: 09/30/20 Potential to Achieve Goals: Good  OT Frequency: Min 3X/week   Barriers to D/C:            Co-evaluation              AM-PAC OT "6 Clicks" Daily Activity     Outcome Measure Help from another  person eating meals?: A Little Help from another person taking care of personal grooming?: A Little Help from another person toileting, which includes using toliet, bedpan, or urinal?: A Little Help from another person bathing (including washing, rinsing, drying)?: A Little Help from another person to put on and taking off regular upper body clothing?: A Little Help from another person to put on and taking off regular lower body clothing?: A Little 6 Click Score: 18   End of Session Equipment Utilized During Treatment: Gait belt;Rolling walker;Oxygen (15L via hfnc) Nurse Communication: Mobility status  Activity Tolerance: Patient tolerated treatment well Patient left: in chair;with call bell/phone within reach  OT Visit Diagnosis: Unsteadiness on feet (R26.81);Other abnormalities of gait and mobility (R26.89);Muscle weakness (generalized) (M62.81)                Time: 2952-8413 OT Time Calculation (min): 43 min Charges:  OT General Charges $OT Visit: 1 Visit OT Evaluation $OT Eval Moderate Complexity: 1 Mod OT Treatments $Self Care/Home Management : 23-37 mins  Serenidy Waltz MSOT, OTR/L Acute Rehab Pager: [phone] Office: [place] 09/16/2020, 3:47 PM

## 2020-09-16 NOTE — Plan of Care (Signed)
  Problem: Education: Goal: Knowledge of risk factors and measures for prevention of condition will improve Outcome: Progressing   Problem: Respiratory: Goal: Will maintain a patent airway Outcome: Progressing   Problem: Education: Goal: Knowledge of General Education information will improve Description: Including pain rating scale, medication(s)/side effects and non-pharmacologic comfort measures Outcome: Progressing   Problem: Health Behavior/Discharge Planning: Goal: Ability to manage health-related needs will improve Outcome: Progressing   Problem: Clinical Measurements: Goal: Respiratory complications will improve Outcome: Progressing   Problem: Nutrition: Goal: Adequate nutrition will be maintained Outcome: Progressing

## 2020-09-16 NOTE — Progress Notes (Signed)
PROGRESS NOTE                                                                                                                                                                                                             Patient Demographics:    April Brewer, is a 74 y.o. female, DOB - 1946/12/08, HUT:[id number]  Outpatient Primary MD for the patient is McGowen, Adrian Blackwater, MD   Admit date - 09/08/2020   LOS - 2  Chief Complaint  Patient presents with  . Shortness of Breath       Brief Narrative: Patient is a 74 y.o. female with PMHx of HLD, hypothyroidism, GERD, lymphocytic colitis, anxiety/depression, chronic back pain on narcotics-presented with several days history of shortness of breath-found to have acute hypoxic respiratory failure secondary to COVID-19 pneumonia.  COVID-19 vaccinated status: Vaccinated  Significant Events: 9/18>> Admit to [facility] for severe hypoxia due to COVID-19 pneumonia  Significant studies: 9/17>>Chest x-ray: Diffuse interstitial and groundglass opacities  COVID-19 medications: Steroids:9/17>> Remdesivir: 9/17>> Baricitinib: 9/18>>  Antibiotics: Rocephin: 9/18 x 1 Zithromax: 9/18 x 1  Microbiology data: 9/17 >>blood culture: No growth  Procedures: None  Consults: None  DVT prophylaxis: enoxaparin (LOVENOX) injection 40 mg Start: 09/14/20 1000   Subjective:   Appears stable at rest-but does acknowledge shortness of breath with minimal activity.  On 15 L of oxygen via HFNC.   Assessment  & Plan :   Acute Hypoxic Resp Failure due to Covid 19 Viral pneumonia: Has severe hypoxemia-on 15 L of HFNC-but appears comfortable.  Was on 10 L of HFNC yesterday-continue steroids but will escalate dosing to 1 mg/kilogram twice daily dosing-remains on Remdesivir and baricitinib.   No evidence of volume overload-does not require diuretics today.  Continue to monitor closely-if she deteriorates  significantly-we will require transfer to the ICU.    Fever: afebrile O2 requirements:  SpO2: 96 % O2 Flow Rate (L/min): 10 L/min   COVID-19 Labs: Recent Labs    09/14/2020 2208 09/15/20 0324 09/16/20 0500  DDIMER 1.04* 0.85* 1.28*  FERRITIN 624* 424* 384*  LDH 386*  --   --   CRP 16.4* 12.1* 7.5*       Component Value Date/Time   BNP 229.9 (H) 09/14/2020 1305    Recent Labs  Lab 09/02/2020 2208  PROCALCITON 0.16    Lab Results  Component Value Date   SARSCOV2NAA POSITIVE (A) 09/17/2020   SARSCOV2NAA NOT DETECTED 06/06/2019     Prone/Incentive Spirometry: encouraged patient to lie prone for 3-4 hours at a time for a total of 16 hours a day, and to encourage incentive spirometry use 3-4/hour.  Transaminitis: Secondary to COVID-19-mild-stable for further work-up.  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  Avoid excessive free water intake.  Continue to follow closely.    HLD: Continue statin  Hypothyroidism: Continue Synthroid  Anxiety/depression: Appears stable-resume Xanax, Zoloft and lamotrigine.  Chronic back pain: Stable-continue as needed narcotics.  Goals of care discussion: Continue full scope of treatment-but short of intubation.  Given her tenuous situation-after extensive discussion with family/patient-we all agree that if she were to deteriorate significantly where she would need intubation/mechanical ventilation-she would likely not do well-all agree that she would best served by a DNR status.   GI prophylaxis:H2 Blocker  ABG:    Component Value Date/Time   TCO2 22 02/03/2015 0326    Vent Settings: N/A  Condition - Extremely Guarded  Family Communication  :  Son [name] [phone]) updated over the phone 9/20  Code Status :  Full Code>> DNR-after extensive discussion with patient's son over the phone on 9/20.   Diet :  Diet Order            Diet regular Room service appropriate? Yes; Fluid consistency: Thin  Diet  effective now                  Disposition Plan  :   Status is: Inpatient  Remains inpatient appropriate because:Inpatient level of care appropriate due to severity of illness   Dispo: The patient is from: Home              Anticipated d/c is to: Home              Anticipated d/c date is: > 3 days              Patient currently is not medically stable to d/c.  Barriers to discharge: Hypoxia requiring O2 supplementation/complete 5 days of IV Remdesivir  Antimicorbials  :    Anti-infectives (From admission, onward)   Start     Dose/Rate Route Frequency Ordered Stop   09/14/20 2200  cefTRIAXone (ROCEPHIN) 2 g in sodium chloride 0.9 % 100 mL IVPB  Status:  Discontinued        2 g 200 mL/hr over 30 Minutes Intravenous Every 24 hours 09/14/20 2043 09/15/20 1102   09/14/20 2200  azithromycin (ZITHROMAX) 500 mg in sodium chloride 0.9 % 250 mL IVPB  Status:  Discontinued        500 mg 250 mL/hr over 60 Minutes Intravenous Every 24 hours 09/14/20 2043 09/15/20 1102   09/14/20 1000  remdesivir 100 mg in sodium chloride 0.9 % 100 mL IVPB       "Followed by" Linked Group Details   100 mg 200 mL/hr over 30 Minutes Intravenous Daily 09/20/2020 2306 09/18/20 0959   09/21/2020 2330  remdesivir 100 mg in sodium chloride 0.9 % 100 mL IVPB       "Followed by" Linked Group Details   100 mg 200 mL/hr over 30 Minutes Intravenous Every 30 min 09/11/2020 2306 09/14/20 0033      Inpatient Medications  Scheduled Meds: . acidophilus  1 capsule Oral Daily  . albuterol  2 puff Inhalation Q6H  . alprazolam  2 mg Oral BID  . vitamin C  500 mg Oral Daily  . atorvastatin  10 mg Oral Daily  . baricitinib  4 mg Oral Q24H  . enoxaparin (LOVENOX) injection  40 mg Subcutaneous Q24H  . famotidine  20 mg Oral BID  . feeding supplement (ENSURE ENLIVE)  237 mL Oral BID BM  . lamoTRIgine  100 mg Oral Daily  . levothyroxine  112.5 mcg Oral Once per day on Mon Fri  . [START ON 09/17/2020] levothyroxine  75 mcg  Oral Once per day on Sun Tue Wed Thu Sat  . mouth rinse  15 mL Mouth Rinse BID  . methylPREDNISolone (SOLU-MEDROL) injection  0.5 mg/kg Intravenous Q12H   Followed by  . [START ON 09/17/2020] predniSONE  50 mg Oral Daily  . sertraline  100 mg Oral BID  . sodium chloride flush  3 mL Intravenous Q12H  . zinc sulfate  220 mg Oral Daily   Continuous Infusions: . sodium chloride Stopped (09/15/20 0004)  . sodium chloride Stopped (09/14/20 2233)  . remdesivir 100 mg in NS 100 mL 100 mg (09/16/20 0822)   PRN Meds:.sodium chloride, sodium chloride, acetaminophen, chlorpheniramine-HYDROcodone, guaiFENesin-dextromethorphan, methocarbamol, ondansetron **OR** ondansetron (ZOFRAN) IV, oxyCODONE, phenol   Time Spent in minutes  35    See all Orders from today for further details   Oren Binet M.D on 09/16/2020 at 2:28 PM  To page go to [email or web address] - use universal password  [facility] -  Office  [phone]    Objective:   Vitals:   09/16/20 0605 09/16/20 0751 09/16/20 1000 09/16/20 1235  BP: 107/65 128/72 111/68 102/64  Pulse: 71 78 85 82  Resp: (!) 28 20 18 17   Temp: 98.8 F (37.1 C) 97.8 F (36.6 C) 98.2 F (36.8 C) 98.1 F (36.7 C)  TempSrc: Oral Oral Oral Oral  SpO2: 91% (!) 84% 93% 96%  Weight:      Height:        Wt Readings from Last 3 Encounters:  09/10/2020 66.2 kg  09/04/20 69.2 kg  07/24/20 69.7 kg     Intake/Output Summary (Last 24 hours) at 09/16/2020 1428 Last data filed at 09/16/2020 1341 Gross per 24 hour  Intake 363 ml  Output --  Net 363 ml     Physical Exam Gen Exam:Alert awake-not in any distress HEENT:atraumatic, normocephalic Chest: B/L clear to auscultation anteriorly CVS:S1S2 regular Abdomen:soft non tender, non distended Extremities:no edema Neurology: Non focal Skin: no rash   Data Review:    CBC Recent Labs  Lab 09/07/2020 2208 09/15/20 0324 09/16/20 0500  WBC 5.3 5.5 8.2  HGB 11.1* 10.2* 10.6*  HCT 32.1* 30.2*  31.5*  PLT 247 274 357  MCV 88.9 89.9 91.6  MCH 30.7 30.4 30.8  MCHC 34.6 33.8 33.7  RDW 14.2 14.6 15.0  LYMPHSABS 0.8 1.1 1.5  MONOABS 0.4 0.4 0.7  EOSABS 0.0 0.0 0.0  BASOSABS 0.0 0.0 0.0    Chemistries  Recent Labs  Lab 09/11/2020 2208 09/14/20 1305 09/15/20 0324 09/16/20 0500  NA 126* 132* 130* 133*  K 3.4* 3.7 4.2 4.6  CL 92* 100 99 101  CO2 23 22 22 24   GLUCOSE 124* 172* 154* 119*  BUN 11 8 12 13   CREATININE 0.70 0.84 0.76 0.89  CALCIUM 8.2* 8.5* 8.5* 8.7*  MG  --  1.6* 1.9 1.8  AST 64*  --  45* 39  ALT 27  --  24 23  ALKPHOS 107  --  95 102  BILITOT 0.4  --  0.4 0.5   ------------------------------------------------------------------------------------------------------------------  Recent Labs    09/26/2020 2208  TRIG 73    No results found for: HGBA1C ------------------------------------------------------------------------------------------------------------------ No results for input(s): TSH, T4TOTAL, T3FREE, THYROIDAB in the last 72 hours.  Invalid input(s): FREET3 ------------------------------------------------------------------------------------------------------------------ Recent Labs    09/15/20 0324 09/16/20 0500  FERRITIN 424* 384*    Coagulation profile No results for input(s): INR, PROTIME in the last 168 hours.  Recent Labs    09/15/20 0324 09/16/20 0500  DDIMER 0.85* 1.28*    Cardiac Enzymes No results for input(s): CKMB, TROPONINI, MYOGLOBIN in the last 168 hours.  Invalid input(s): CK ------------------------------------------------------------------------------------------------------------------    Component Value Date/Time   BNP 229.9 (H) 09/14/2020 1305    Micro Results Recent Results (from the past 240 hour(s))  Blood Culture (routine x 2)     Status: None (Preliminary result)   Collection Time: 09/24/2020 10:00 PM   Specimen: BLOOD  Result Value Ref Range Status   Specimen Description   Final    BLOOD LEFT  ANTECUBITAL Performed at [facility], [place] [address], [place], [place] [address]    Special Requests   Final    BOTTLES DRAWN AEROBIC AND ANAEROBIC Blood Culture adequate volume Performed at [facility], [address] [place], [place] [address]    Culture   Final    NO GROWTH 1 DAY Performed at [facility], [place] [address] [place],  [address]    Report Status PENDING  Incomplete  SARS Coronavirus 2 by RT PCR (hospital order, performed in [facility] hospital lab) Nasopharyngeal Nasopharyngeal Swab     Status: Abnormal   Collection Time: 09/25/2020 10:07 PM   Specimen: Nasopharyngeal Swab  Result Value Ref Range Status   SARS Coronavirus 2 POSITIVE (A) NEGATIVE Final    Comment: RESULT CALLED TO, READ BACK BY AND VERIFIED WITH: NEAL,K, RN @ 4128 09/12/2020 BY GWYN,P (NOTE) SARS-CoV-2 target nucleic acids are DETECTED  SARS-CoV-2 RNA is generally detectable in upper respiratory specimens  during the acute phase of infection.  Positive results are indicative  of the presence of the identified virus, but do not rule out bacterial infection or co-infection with other pathogens not detected by the test.  Clinical correlation with patient history and  other diagnostic information is necessary to determine patient infection status.  The expected result is negative.  Fact Sheet for Patients:   StrictlyIdeas.no   Fact Sheet for Healthcare Providers:   BankingDealers.co.za    This test is not yet approved or cleared by the [place] FDA and  has been authorized for detection and/or diagnosis of SARS-CoV-2 by FDA under an Emergency Use Authorization (EUA).  This EUA will remain in effect (meaning this t est can be used) for the duration of  the COVID-19 declaration under Section 564(b)(1) of the Act, 21 U.S.C. section 360-bbb-3(b)(1), unless the authorization is terminated or revoked sooner.  Performed at  [facility], [address] [place], [place] [address]   MRSA PCR Screening     Status: None   Collection Time: 09/15/20 12:14 AM   Specimen: Nasal Mucosa; Nasopharyngeal  Result Value Ref Range Status   MRSA by PCR NEGATIVE NEGATIVE Final    Comment:        The GeneXpert MRSA Assay (FDA approved for NASAL specimens only), is one component of a comprehensive MRSA colonization surveillance program. It is not intended to diagnose MRSA infection nor to guide or monitor treatment for MRSA infections. Performed at [facility], [place] [address] [place], [place] [address]     Radiology Reports DG Chest Portable 1 View  Result Date: 09/03/2020 CLINICAL DATA:  Cough, congestion, fever, loss of taste and smell for 1 week EXAM: PORTABLE CHEST 1 VIEW COMPARISON:  01/11/2014 FINDINGS: Single frontal view of the chest demonstrates stable enlarged cardiac silhouette. There is diffuse increased interstitial prominence with bilateral ground-glass consolidation. No effusion or pneumothorax. No acute bony abnormalities. Bilateral shoulder arthroplasties. IMPRESSION: 1. Diffuse interstitial and ground-glass opacities, consistent with bilateral atypical pneumonia given clinical history. Electronically Signed   By: Randa Ngo M.D.   On: 09/22/2020 23:33

## 2020-09-17 LAB — COMPREHENSIVE METABOLIC PANEL
ALT: 23 U/L (ref 0–44)
AST: 39 U/L (ref 15–41)
Albumin: 2.5 g/dL — ABNORMAL LOW (ref 3.5–5.0)
Alkaline Phosphatase: 98 U/L (ref 38–126)
Anion gap: 9 (ref 5–15)
BUN: 12 mg/dL (ref 8–23)
CO2: 23 mmol/L (ref 22–32)
Calcium: 8.3 mg/dL — ABNORMAL LOW (ref 8.9–10.3)
Chloride: 97 mmol/L — ABNORMAL LOW (ref 98–111)
Creatinine, Ser: 0.91 mg/dL (ref 0.44–1.00)
GFR calc Af Amer: >60 mL/min (ref >60)
GFR calc non Af Amer: >60 mL/min (ref >60)
Glucose, Bld: 146 mg/dL — ABNORMAL HIGH (ref 70–99)
Potassium: 4.5 mmol/L (ref 3.5–5.1)
Sodium: 129 mmol/L — ABNORMAL LOW (ref 135–145)
Total Bilirubin: 0.7 mg/dL (ref 0.3–1.2)
Total Protein: 5.9 g/dL — ABNORMAL LOW (ref 6.5–8.1)

## 2020-09-17 LAB — CBC WITH DIFFERENTIAL/PLATELET
Abs Immature Granulocytes: 0.03 10*3/uL (ref 0.00–0.07)
Basophils Absolute: 0 10*3/uL (ref 0.0–0.1)
Basophils Relative: 0 %
Eosinophils Absolute: 0 10*3/uL (ref 0.0–0.5)
Eosinophils Relative: 0 %
HCT: 28.7 % — ABNORMAL LOW (ref 36.0–46.0)
Hemoglobin: 9.7 g/dL — ABNORMAL LOW (ref 12.0–15.0)
Immature Granulocytes: 0 %
Lymphocytes Relative: 10 %
Lymphs Abs: 0.7 10*3/uL (ref 0.7–4.0)
MCH: 30.7 pg (ref 26.0–34.0)
MCHC: 33.8 g/dL (ref 30.0–36.0)
MCV: 90.8 fL (ref 80.0–100.0)
Monocytes Absolute: 0.2 10*3/uL (ref 0.1–1.0)
Monocytes Relative: 3 %
Neutro Abs: 5.8 10*3/uL (ref 1.7–7.7)
Neutrophils Relative %: 87 %
Platelets: 317 10*3/uL (ref 150–400)
RBC: 3.16 MIL/uL — ABNORMAL LOW (ref 3.87–5.11)
RDW: 14.7 % (ref 11.5–15.5)
WBC: 6.7 10*3/uL (ref 4.0–10.5)
nRBC: 0 % (ref 0.0–0.2)

## 2020-09-17 LAB — MAGNESIUM: Magnesium: 1.7 mg/dL (ref 1.7–2.4)

## 2020-09-17 LAB — FERRITIN: Ferritin: 335 ng/mL — ABNORMAL HIGH (ref 11–307)

## 2020-09-17 LAB — C-REACTIVE PROTEIN: CRP: 9.1 mg/dL — ABNORMAL HIGH (ref <1.0)

## 2020-09-17 LAB — D-DIMER, QUANTITATIVE: D-Dimer, Quant: 1.41 ug/mL-FEU — ABNORMAL HIGH (ref 0.00–0.50)

## 2020-09-17 MED ORDER — PANTOPRAZOLE SODIUM 40 MG PO TBEC
40.0000 mg | DELAYED_RELEASE_TABLET | Freq: Every day | ORAL | Status: DC
  Administered 2020-09-17 – 2020-09-20 (×4): 40 mg via ORAL
  Filled 2020-09-17 (×4): qty 1

## 2020-09-17 MED ORDER — FUROSEMIDE 10 MG/ML IJ SOLN
40.0000 mg | Freq: Once | INTRAMUSCULAR | Status: AC
  Administered 2020-09-17: 40 mg via INTRAVENOUS
  Filled 2020-09-17: qty 4

## 2020-09-17 MED ORDER — MAGNESIUM SULFATE 2 GM/50ML IV SOLN
2.0000 g | Freq: Once | INTRAVENOUS | Status: AC
  Administered 2020-09-17: 2 g via INTRAVENOUS

## 2020-09-17 MED ORDER — ALPRAZOLAM 0.5 MG PO TABS
1.0000 mg | ORAL_TABLET | Freq: Two times a day (BID) | ORAL | Status: DC
  Administered 2020-09-18 – 2020-09-20 (×4): 1 mg via ORAL
  Filled 2020-09-17 (×4): qty 2

## 2020-09-17 NOTE — Progress Notes (Signed)
Physical Therapy Treatment Patient Details Name: April Brewer MRN: [id number] DOB: [birth year] Today's Date: 09/17/2020    History of Present Illness Pt is 74 y.o. female with medical history significant of hyperlipidemia, hypothyroidism, GERD, lymphocytic colitis, anxiety, and depression presented with complaints of shortness of breath and cough over the last 2 - 3 days. Noted associated symptoms of fever, loss of taste/smell, poor appetite, nausea, generalized weakness, and diarrhea. COVID-19 screening was positive.    PT Comments    Pt with decline in performance today that seemed to be mostly limited by lethargy.  Concern for orthostatic hypotension due to c/o weakness and lethargy but BP stable.  HR stable.  Did have decrease in O2 sats, but no significant change since yesterday.  In standing, pt kept having LOB to R and closing eyes.  Performed neuro screen and other than LOB no symptoms.  Appears to just be limited by lethargy.  Requiring mod A for transfers and multimodal cues for exercise. Required frequent cues to keep eyes open. RN is aware.  Did update POC to SNF due to pt not having 24 hr support and with decline in function.     Follow Up Recommendations  SNF     Equipment Recommendations  None recommended by PT    Recommendations for Other Services       Precautions / Restrictions Precautions Precautions: Fall Precaution Comments: syncope with fall several weeks ago    Mobility  Bed Mobility               General bed mobility comments: In recliner upon arrival  Transfers Overall transfer level: Needs assistance Equipment used: Rolling walker (2 wheeled) Transfers: Sit to/from Stand Sit to Stand: Mod assist         General transfer comment: Min A to power up but mod A for balance upon initial stand.  Pt leaning to R requiring mod A to regain balance and then min guard.  On second attempt, required min A.  Ambulation/Gait Ambulation/Gait assistance:  Mod assist Gait Distance (Feet): 2 Feet Assistive device: Rolling walker (2 wheeled) Gait Pattern/deviations: Step-to pattern;Decreased stride length;Shuffle;Staggering right Gait velocity: decreased   General Gait Details: Attempted to have pt march in place due to lethargic and unsteady to walk away from chair, but she took 2-4 steps forward with mod A for balance and required mod A to return to chair with increasing unsteadiness.   Stairs             Wheelchair Mobility    Modified Rankin (Stroke Patients Only)       Balance Overall balance assessment: Needs assistance Sitting-balance support: Feet supported;No upper extremity supported Sitting balance-Leahy Scale: Poor Sitting balance - Comments: leaning posterior and R; limited by lethargy   Standing balance support: During functional activity;Bilateral upper extremity supported Standing balance-Leahy Scale: Poor Standing balance comment: Stood x 2; requiring mod A with initial standing and leaning R; on second attempt required min A; limited by lethargy                            Cognition Arousal/Alertness: Lethargic Behavior During Therapy: WFL for tasks assessed/performed Overall Cognitive Status: Within Functional Limits for tasks assessed                                 General Comments: Pt was extremely lethargic, needed multiple cues to  stay alert and keep eyes open - even in standing      Exercises Other Exercises Other Exercises: Flutter valve and incentative spirometer x 10 with max cues.  Also, scapular retraction with deep breathing with max verbal and tactle/facilitation cues for form    General Comments General comments (skin integrity, edema, etc.): Pt on 10 L HFNC with sats 93% rest and dropped to 82% with activity requiring 2-3 mins to recover.  HR 60-73 bpm thoughout session.  Thought pt may have been hypotensive due to lethargy, c/o weakness, loss of balance but BP  139/86 sitting and 122/72 standing.  Pt leaning to R in standing.  Performed neuro screen and pt with no other neurological signs other than LOB.      Pertinent Vitals/Pain Pain Assessment: No/denies pain    Home Living Family/patient expects to be discharged to:: Private residence                    Prior Function            PT Goals (current goals can now be found in the care plan section) Acute Rehab PT Goals Patient Stated Goal: be able to go home from hospital PT Goal Formulation: With patient Time For Goal Achievement: 09/29/20 Potential to Achieve Goals: Good Progress towards PT goals: Not progressing toward goals - comment (extreme lethargy limited today)    Frequency    Min 3X/week      PT Plan Discharge plan needs to be updated    Co-evaluation              AM-PAC PT "6 Clicks" Mobility   Outcome Measure  Help needed turning from your back to your side while in a flat bed without using bedrails?: A Little Help needed moving from lying on your back to sitting on the side of a flat bed without using bedrails?: A Little Help needed moving to and from a bed to a chair (including a wheelchair)?: A Lot Help needed standing up from a chair using your arms (e.g., wheelchair or bedside chair)?: A Lot Help needed to walk in hospital room?: A Lot Help needed climbing 3-5 steps with a railing? : Total 6 Click Score: 13    End of Session Equipment Utilized During Treatment: Oxygen;Gait belt Activity Tolerance: Patient limited by lethargy Patient left: with call bell/phone within reach;in chair;Other (comment) (feet elevated)   PT Visit Diagnosis: Muscle weakness (generalized) (M62.81);Difficulty in walking, not elsewhere classified (R26.2)     Time: 7106-2694 PT Time Calculation (min) (ACUTE ONLY): 23 min  Charges:  $Therapeutic Exercise: 8-22 mins $Therapeutic Activity: 8-22 mins                     Abran Richard, PT Acute Rehab Services Pager  [phone] [facility] [place] 09/17/2020, 4:46 PM

## 2020-09-17 NOTE — Progress Notes (Addendum)
PROGRESS NOTE                                                                                                                                                                                                             Patient Demographics:    April Brewer, is a 74 y.o. female, DOB - [birth year], VEH:[id number]  Outpatient Primary MD for the patient is McGowen, Adrian Blackwater, MD   Admit date - 09/12/2020   LOS - 3  Chief Complaint  Patient presents with  . Shortness of Breath       Brief Narrative: Patient is a 74 y.o. female with PMHx of HLD, hypothyroidism, GERD, lymphocytic colitis, anxiety/depression, chronic back pain on narcotics-presented with several days history of shortness of breath-found to have acute hypoxic respiratory failure secondary to COVID-19 pneumonia.  COVID-19 vaccinated status: Vaccinated  Significant Events: 9/18>> Admit to [facility] for severe hypoxia due to COVID-19 pneumonia  Significant studies: 9/17>>Chest x-ray: Diffuse interstitial and groundglass opacities  COVID-19 medications: Steroids:9/17>> Remdesivir: 9/17>> Baricitinib: 9/18>>  Antibiotics: Rocephin: 9/18 x 1 Zithromax: 9/18 x 1  Microbiology data: 9/17 >>blood culture: No growth  Procedures: None  Consults: None  DVT prophylaxis: enoxaparin (LOVENOX) injection 40 mg Start: 09/14/20 1000   Subjective:   Appears comfortable-no complaints-some shortness of breath with minimal movement-on 10 L of HFNC this morning.   Assessment  & Plan :   Acute Hypoxic Resp Failure due to Covid 19 Viral pneumonia: Continues to have severe hypoxemia-oxygen requirements continued to fluctuate between 10-15 L of HFNC.  Appears comfortable-CRP slowly downtrending-continue high-dose Solu-Medrol for a few more days before tapering down.  Remains on baricitinib and Remdesivir.  Although no obvious signs of volume overload-we will give 1 dose of IV  Lasix and attempt to keep in negative balance.  Continue to monitor closely.  Fever: afebrile O2 requirements:  SpO2: 92 % O2 Flow Rate (L/min): 10 L/min   COVID-19 Labs: Recent Labs    09/15/20 0324 09/16/20 0500 09/17/20 0123  DDIMER 0.85* 1.28* 1.41*  FERRITIN 424* 384* 335*  CRP 12.1* 7.5* 9.1*       Component Value Date/Time   BNP 229.9 (H) 09/14/2020 1305    Recent Labs  Lab 09/23/2020 2208  PROCALCITON 0.16    Lab Results  Component Value Date   SARSCOV2NAA POSITIVE (A) 09/15/2020  SARSCOV2NAA NOT DETECTED 06/06/2019     Prone/Incentive Spirometry: encouraged patient to lie prone for 3-4 hours at a time for a total of 16 hours a day, and to encourage incentive spirometry use 3-4/hour.  Transaminitis: Secondary to COVID-19-mild-continue to monitor.  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  Follow chemistries-if sodium levels decreased significantly-may need to stop Zoloft  HLD: Continue statin  Hypothyroidism: Continue Synthroid-most recent TSH within normal limits.  Anxiety/depression: Appears stable-resume Xanax, Zoloft and lamotrigine.  Chronic back pain: Stable-continue as needed narcotics.  Goals of care discussion: Continue full scope of treatment- short of intubation.  Given her tenuous situation-after extensive discussion with son/patient-we all agree that if patient were to deteriorate significantly-we will not escalate care to include intubation/mechanical ventilation-DNR order placed on 9/20  GI prophylaxis:H2 Blocker  ABG:    Component Value Date/Time   TCO2 22 02/03/2015 0326    Vent Settings: N/A  Condition - Extremely Guarded  Family Communication  :  Son (Thomas [phone]) called on 9/21-no answer-unable to leave a voicemail as mailbox full.  Addendum: Son called back later-I spoke to him this afternoon.  Code  Status :  Full Code>> DNR-after extensive discussion with patient's son over the phone on 9/20.   Diet :  Diet Order            Diet regular Room service appropriate? Yes; Fluid consistency: Thin  Diet effective now                  Disposition Plan  :   Status is: Inpatient  Remains inpatient appropriate because:Inpatient level of care appropriate due to severity of illness   Dispo: The patient is from: Home              Anticipated d/c is to: Home              Anticipated d/c date is: > 3 days              Patient currently is not medically stable to d/c.  Barriers to discharge: Hypoxia requiring O2 supplementation/complete 5 days of IV Remdesivir  Antimicorbials  :    Anti-infectives (From admission, onward)   Start     Dose/Rate Route Frequency Ordered Stop   09/14/20 2200  cefTRIAXone (ROCEPHIN) 2 g in sodium chloride 0.9 % 100 mL IVPB  Status:  Discontinued        2 g 200 mL/hr over 30 Minutes Intravenous Every 24 hours 09/14/20 2043 09/15/20 1102   09/14/20 2200  azithromycin (ZITHROMAX) 500 mg in sodium chloride 0.9 % 250 mL IVPB  Status:  Discontinued        500 mg 250 mL/hr over 60 Minutes Intravenous Every 24 hours 09/14/20 2043 09/15/20 1102   09/14/20 1000  remdesivir 100 mg in sodium chloride 0.9 % 100 mL IVPB       "Followed by" Linked Group Details   100 mg 200 mL/hr over 30 Minutes Intravenous Daily 08/31/2020 2306 09/17/20 0907   09/17/2020 2330  remdesivir 100 mg in sodium chloride 0.9 % 100 mL IVPB       "Followed by" Linked Group Details   100 mg 200 mL/hr over 30 Minutes Intravenous Every 30 min 09/19/2020 2306 09/14/20 0033      Inpatient Medications  Scheduled Meds: . acidophilus  1 capsule Oral Daily  . albuterol  2 puff Inhalation  Q6H  . alprazolam  2 mg Oral BID  . vitamin C  500 mg Oral Daily  . atorvastatin  10 mg Oral Daily  . baricitinib  4 mg Oral Q24H  . enoxaparin (LOVENOX) injection  40 mg Subcutaneous Q24H  . famotidine  20 mg Oral  BID  . feeding supplement (ENSURE ENLIVE)  237 mL Oral BID BM  . lamoTRIgine  100 mg Oral Daily  . levothyroxine  112.5 mcg Oral Once per day on Mon Fri  . levothyroxine  75 mcg Oral Once per day on Sun Tue Wed Thu Sat  . mouth rinse  15 mL Mouth Rinse BID  . methylPREDNISolone (SOLU-MEDROL) injection  70 mg Intravenous Q12H  . pantoprazole  40 mg Oral Q1200  . sertraline  100 mg Oral BID  . sodium chloride flush  3 mL Intravenous Q12H  . zinc sulfate  220 mg Oral Daily   Continuous Infusions: . sodium chloride Stopped (09/15/20 0004)  . sodium chloride Stopped (09/14/20 2233)   PRN Meds:.sodium chloride, sodium chloride, acetaminophen, chlorpheniramine-HYDROcodone, guaiFENesin-dextromethorphan, methocarbamol, ondansetron **OR** ondansetron (ZOFRAN) IV, oxyCODONE, phenol   Time Spent in minutes  35    See all Orders from today for further details   Oren Binet M.D on 09/17/2020 at 1:02 PM  To page go to [email or web address] - use universal password  [facility] -  Office  [phone]    Objective:   Vitals:   09/17/20 0018 09/17/20 0400 09/17/20 0731 09/17/20 1155  BP: 130/67 99/61 112/72 132/71  Pulse: 87 71 73 60  Resp: 20 20 19 18   Temp: 98 F (36.7 C) 98.2 F (36.8 C) 97.6 F (36.4 C) 97.6 F (36.4 C)  TempSrc: Axillary Axillary Oral Oral  SpO2: 90% 91% (!) 87% 92%  Weight:      Height:        Wt Readings from Last 3 Encounters:  09/15/2020 66.2 kg  09/04/20 69.2 kg  07/24/20 69.7 kg     Intake/Output Summary (Last 24 hours) at 09/17/2020 1302 Last data filed at 09/17/2020 7026 Gross per 24 hour  Intake 580 ml  Output 1 ml  Net 579 ml     Physical Exam Gen Exam:Alert awake-not in any distress HEENT:atraumatic, normocephalic Chest: B/L clear to auscultation anteriorly CVS:S1S2 regular Abdomen:soft non tender, non distended Extremities:no edema Neurology: Non focal Skin: no rash   Data Review:    CBC Recent Labs  Lab 09/06/2020 2208  09/15/20 0324 09/16/20 0500 09/17/20 0123  WBC 5.3 5.5 8.2 6.7  HGB 11.1* 10.2* 10.6* 9.7*  HCT 32.1* 30.2* 31.5* 28.7*  PLT 247 274 357 317  MCV 88.9 89.9 91.6 90.8  MCH 30.7 30.4 30.8 30.7  MCHC 34.6 33.8 33.7 33.8  RDW 14.2 14.6 15.0 14.7  LYMPHSABS 0.8 1.1 1.5 0.7  MONOABS 0.4 0.4 0.7 0.2  EOSABS 0.0 0.0 0.0 0.0  BASOSABS 0.0 0.0 0.0 0.0    Chemistries  Recent Labs  Lab 09/21/2020 2208 09/14/20 1305 09/15/20 0324 09/16/20 0500 09/17/20 0123  NA 126* 132* 130* 133* 129*  K 3.4* 3.7 4.2 4.6 4.5  CL 92* 100 99 101 97*  CO2 23 22 22 24 23   GLUCOSE 124* 172* 154* 119* 146*  BUN 11 8 12 13 12   CREATININE 0.70 0.84 0.76 0.89 0.91  CALCIUM 8.2* 8.5* 8.5* 8.7* 8.3*  MG  --  1.6* 1.9 1.8 1.7  AST 64*  --  45* 39 39  ALT 27  --  24  23 23  ALKPHOS 107  --  95 102 98  BILITOT 0.4  --  0.4 0.5 0.7   ------------------------------------------------------------------------------------------------------------------ No results for input(s): CHOL, HDL, LDLCALC, TRIG, CHOLHDL, LDLDIRECT in the last 72 hours.  No results found for: HGBA1C ------------------------------------------------------------------------------------------------------------------ No results for input(s): TSH, T4TOTAL, T3FREE, THYROIDAB in the last 72 hours.  Invalid input(s): FREET3 ------------------------------------------------------------------------------------------------------------------ Recent Labs    09/16/20 0500 09/17/20 0123  FERRITIN 384* 335*    Coagulation profile No results for input(s): INR, PROTIME in the last 168 hours.  Recent Labs    09/16/20 0500 09/17/20 0123  DDIMER 1.28* 1.41*    Cardiac Enzymes No results for input(s): CKMB, TROPONINI, MYOGLOBIN in the last 168 hours.  Invalid input(s): CK ------------------------------------------------------------------------------------------------------------------    Component Value Date/Time   BNP 229.9 (H) 09/14/2020 1305     Micro Results Recent Results (from the past 240 hour(s))  Blood Culture (routine x 2)     Status: None (Preliminary result)   Collection Time: 09/15/2020 10:00 PM   Specimen: BLOOD  Result Value Ref Range Status   Specimen Description   Final    BLOOD LEFT ANTECUBITAL Performed at [facility], [place] [address], [place], [place] [address]    Special Requests   Final    BOTTLES DRAWN AEROBIC AND ANAEROBIC Blood Culture adequate volume Performed at [facility], [address], [place], [place] [address]    Culture   Final    NO GROWTH 3 DAYS Performed at [facility], [place] [address], [place], [place] [address]    Report Status PENDING  Incomplete  SARS Coronavirus 2 by RT PCR (hospital order, performed in [facility] hospital lab) Nasopharyngeal Nasopharyngeal Swab     Status: Abnormal   Collection Time: 09/05/2020 10:07 PM   Specimen: Nasopharyngeal Swab  Result Value Ref Range Status   SARS Coronavirus 2 POSITIVE (A) NEGATIVE Final    Comment: RESULT CALLED TO, READ BACK BY AND VERIFIED WITH: NEAL,K, RN @ 0109 09/21/2020 BY GWYN,P (NOTE) SARS-CoV-2 target nucleic acids are DETECTED  SARS-CoV-2 RNA is generally detectable in upper respiratory specimens  during the acute phase of infection.  Positive results are indicative  of the presence of the identified virus, but do not rule out bacterial infection or co-infection with other pathogens not detected by the test.  Clinical correlation with patient history and  other diagnostic information is necessary to determine patient infection status.  The expected result is negative.  Fact Sheet for Patients:   StrictlyIdeas.no   Fact Sheet for Healthcare Providers:   BankingDealers.co.za    This test is not yet approved or cleared by the [place] FDA and  has been authorized for detection and/or diagnosis of SARS-CoV-2 by FDA under an Emergency Use Authorization  (EUA).  This EUA will remain in effect (meaning this t est can be used) for the duration of  the COVID-19 declaration under Section 564(b)(1) of the Act, 21 U.S.C. section 360-bbb-3(b)(1), unless the authorization is terminated or revoked sooner.  Performed at [facility], [address] [place], [place] [address]   MRSA PCR Screening     Status: None   Collection Time: 09/15/20 12:14 AM   Specimen: Nasal Mucosa; Nasopharyngeal  Result Value Ref Range Status   MRSA by PCR NEGATIVE NEGATIVE Final    Comment:        The GeneXpert MRSA Assay (FDA approved for NASAL specimens only), is one component of a  comprehensive MRSA colonization surveillance program. It is not intended to diagnose MRSA infection nor to guide or monitor treatment for MRSA infections. Performed at [facility], [place] [address], [place], [place] [address]     Radiology Reports DG Chest Portable 1 View  Result Date: 09/22/2020 CLINICAL DATA:  Cough, congestion, fever, loss of taste and smell for 1 week EXAM: PORTABLE CHEST 1 VIEW COMPARISON:  01/11/2014 FINDINGS: Single frontal view of the chest demonstrates stable enlarged cardiac silhouette. There is diffuse increased interstitial prominence with bilateral ground-glass consolidation. No effusion or pneumothorax. No acute bony abnormalities. Bilateral shoulder arthroplasties. IMPRESSION: 1. Diffuse interstitial and ground-glass opacities, consistent with bilateral atypical pneumonia given clinical history. Electronically Signed   By: Randa Ngo M.D.   On: 09/17/2020 23:33

## 2020-09-18 LAB — D-DIMER, QUANTITATIVE: D-Dimer, Quant: 1.89 ug/mL-FEU — ABNORMAL HIGH (ref 0.00–0.50)

## 2020-09-18 LAB — CBC WITH DIFFERENTIAL/PLATELET
Abs Immature Granulocytes: 0.1 10*3/uL — ABNORMAL HIGH (ref 0.00–0.07)
Basophils Absolute: 0 10*3/uL (ref 0.0–0.1)
Basophils Relative: 0 %
Eosinophils Absolute: 0 10*3/uL (ref 0.0–0.5)
Eosinophils Relative: 0 %
HCT: 33.6 % — ABNORMAL LOW (ref 36.0–46.0)
Hemoglobin: 11.4 g/dL — ABNORMAL LOW (ref 12.0–15.0)
Immature Granulocytes: 1 %
Lymphocytes Relative: 17 %
Lymphs Abs: 1.6 10*3/uL (ref 0.7–4.0)
MCH: 30.2 pg (ref 26.0–34.0)
MCHC: 33.9 g/dL (ref 30.0–36.0)
MCV: 89.1 fL (ref 80.0–100.0)
Monocytes Absolute: 0.8 10*3/uL (ref 0.1–1.0)
Monocytes Relative: 9 %
Neutro Abs: 7 10*3/uL (ref 1.7–7.7)
Neutrophils Relative %: 73 %
Platelets: 419 10*3/uL — ABNORMAL HIGH (ref 150–400)
RBC: 3.77 MIL/uL — ABNORMAL LOW (ref 3.87–5.11)
RDW: 14.6 % (ref 11.5–15.5)
WBC: 9.5 10*3/uL (ref 4.0–10.5)
nRBC: 0 % (ref 0.0–0.2)

## 2020-09-18 LAB — COMPREHENSIVE METABOLIC PANEL
ALT: 29 U/L (ref 0–44)
AST: 43 U/L — ABNORMAL HIGH (ref 15–41)
Albumin: 2.9 g/dL — ABNORMAL LOW (ref 3.5–5.0)
Alkaline Phosphatase: 110 U/L (ref 38–126)
Anion gap: 11 (ref 5–15)
BUN: 16 mg/dL (ref 8–23)
CO2: 27 mmol/L (ref 22–32)
Calcium: 9 mg/dL (ref 8.9–10.3)
Chloride: 92 mmol/L — ABNORMAL LOW (ref 98–111)
Creatinine, Ser: 0.87 mg/dL (ref 0.44–1.00)
GFR calc Af Amer: >60 mL/min (ref >60)
GFR calc non Af Amer: >60 mL/min (ref >60)
Glucose, Bld: 121 mg/dL — ABNORMAL HIGH (ref 70–99)
Potassium: 4.3 mmol/L (ref 3.5–5.1)
Sodium: 130 mmol/L — ABNORMAL LOW (ref 135–145)
Total Bilirubin: 0.6 mg/dL (ref 0.3–1.2)
Total Protein: 6.9 g/dL (ref 6.5–8.1)

## 2020-09-18 LAB — FERRITIN: Ferritin: 414 ng/mL — ABNORMAL HIGH (ref 11–307)

## 2020-09-18 LAB — C-REACTIVE PROTEIN: CRP: 11.3 mg/dL — ABNORMAL HIGH (ref <1.0)

## 2020-09-18 LAB — MAGNESIUM: Magnesium: 2.3 mg/dL (ref 1.7–2.4)

## 2020-09-18 MED ORDER — INFLUENZA VAC A&B SA ADJ QUAD 0.5 ML IM PRSY
0.5000 mL | PREFILLED_SYRINGE | INTRAMUSCULAR | Status: DC
  Filled 2020-09-18: qty 0.5

## 2020-09-18 NOTE — Progress Notes (Signed)
Occupational Therapy Treatment Patient Details Name: April Brewer MRN: [id number] DOB: 08/21/1946 Today's Date: 09/18/2020    History of present illness Pt is 74 y.o. female with medical history significant of hyperlipidemia, hypothyroidism, GERD, lymphocytic colitis, anxiety, and depression presented with complaints of shortness of breath and cough over the last 2 - 3 days. Noted associated symptoms of fever, loss of taste/smell, poor appetite, nausea, generalized weakness, and diarrhea. COVID-19 screening was positive.   OT comments  Pt seen for OT follow up session with focus on ADL mobility progression to improve activity tolerance. Pt on 13L HFNC during session with VSS at start of session. Pt completed BSC transfer with min A for balance. During transfer, pt SpO2 dropped to 74%. Cues needed for pursed lip breathing while sitting on commode. 3-5 mins then needed to recover O2 sats once pt back in recliner from [facility] transfer. Pt seems to be asymptomatic with sat drop. She then completed x3 grooming tasks in seated position with SpO2 84-86%. Noted cognitive deficits in orientation, memory, attention, and problem solving. Given current status, updating recs to SNF at this time to promote safety and independence in ADL. Will continue to follow per POC below.   Follow Up Recommendations  SNF;Supervision/Assistance - 24 hour    Equipment Recommendations  3 in 1 bedside commode    Recommendations for Other Services      Precautions / Restrictions Precautions Precautions: Fall Precaution Comments: syncope with fall several weeks ago Restrictions Weight Bearing Restrictions: No       Mobility Bed Mobility               General bed mobility comments: up in chair, returned to chair  Transfers Overall transfer level: Needs assistance Equipment used: Rolling walker (2 wheeled) Transfers: Sit to/from [facility] Sit to Stand: Min assist Stand pivot transfers: Min  assist       General transfer comment: assist to rise and steady for balance. Cues for safe hand placement and direction from [facility] > recliner    Balance Overall balance assessment: Needs assistance Sitting-balance support: Feet supported;No upper extremity supported Sitting balance-Leahy Scale: Fair     Standing balance support: During functional activity;Bilateral upper extremity supported Standing balance-Leahy Scale: Poor Standing balance comment: reliant on external support from OT                           ADL either performed or assessed with clinical judgement   ADL Overall ADL's : Needs assistance/impaired     Grooming: Set up;Sitting;Oral care;Brushing hair;Wash/dry face Grooming Details (indicate cue type and reason): pt sitting at sink to perform x3 grooming tasks. requires min-mod cues for attention. SpO2 84-86% with oral care- cues for breaks and breathing.                 Toilet Transfer: Minimal Production assistant, radio Details (indicate cue type and reason): assist to rise and steady for transfer. SpO2 down to 74% with transfer, requiring increased time to recover. Pt asymptomatic/unaware of this Toileting- Clothing Manipulation and Hygiene: Set up;Sit to/from stand Toileting - Clothing Manipulation Details (indicate cue type and reason): able to perform anterior peri care in standing after voiding     Functional mobility during ADLs: Minimal assistance;Rolling walker General ADL Comments: pt having sat drop with standing activity and unaware/asymptomatic     Vision Baseline Vision/History: Wears glasses Wears Glasses: Reading only Patient Visual Report: No change from baseline  Perception     Praxis      Cognition Arousal/Alertness: Awake/alert Behavior During Therapy: WFL for tasks assessed/performed Overall Cognitive Status: Impaired/Different from baseline Area of Impairment:  Orientation;Attention;Memory;Safety/judgement;Problem solving                 Orientation Level: Disoriented to;Time;Situation (OT stated it was fall weather, pt stated "is it January or March") Current Attention Level: Sustained Memory: Decreased short-term memory (states she has not at all worked with therapy when she did the day prior)   Safety/Judgement: Decreased awareness of safety;Decreased awareness of deficits   Problem Solving: Slow processing;Decreased initiation;Requires verbal cues General Comments: pt having difficult time orienting self. Often agrees to statements staff says to mask deficits. Requires increased time to problem solve (esp with initiation/termination) ADLs. Needed VC to stop/start brushing teeth        Exercises     Shoulder Instructions       General Comments      Pertinent Vitals/ Pain       Pain Assessment: No/denies pain  Home Living                                          Prior Functioning/Environment              Frequency  Min 3X/week        Progress Toward Goals  OT Goals(current goals can now be found in the care plan section)  Progress towards OT goals: Not progressing toward goals - comment (noted more cognitive deficits from original eval)  Acute Rehab OT Goals Patient Stated Goal: be able to go home from hospital OT Goal Formulation: With patient Time For Goal Achievement: 09/30/20 Potential to Achieve Goals: Good  Plan Discharge plan needs to be updated    Co-evaluation                 AM-PAC OT "6 Clicks" Daily Activity     Outcome Measure   Help from another person eating meals?: A Little Help from another person taking care of personal grooming?: A Little Help from another person toileting, which includes using toliet, bedpan, or urinal?: A Lot Help from another person bathing (including washing, rinsing, drying)?: A Lot Help from another person to put on and taking off  regular upper body clothing?: A Little Help from another person to put on and taking off regular lower body clothing?: A Lot 6 Click Score: 15    End of Session Equipment Utilized During Treatment: Gait belt;Rolling walker;Oxygen  OT Visit Diagnosis: Unsteadiness on feet (R26.81);Other abnormalities of gait and mobility (R26.89);Muscle weakness (generalized) (M62.81);Other symptoms and signs involving cognitive function   Activity Tolerance Patient tolerated treatment well   Patient Left in chair;with call bell/phone within reach   Nurse Communication Mobility status        Time: 1625-1700 OT Time Calculation (min): 35 min  Charges: OT General Charges $OT Visit: 1 Visit OT Treatments $Self Care/Home Management : 23-37 mins  Zenovia Jarred, MSOT, OTR/L [facility] Office Number: [phone] Pager: [phone]  Zenovia Jarred 09/18/2020, 5:55 PM

## 2020-09-18 NOTE — TOC Initial Note (Addendum)
Transition of Care [facility]) - Initial/Assessment Note    Patient Details  Name: April Brewer MRN: [id number] Date of Birth: 09/20/1946  Transition of Care [facility]) CM/SW Contact:    Bethann Berkshire, Price Phone Number: 09/18/2020, 9:49 AM  Clinical Narrative:                  CSW spoke with pt over phone regarding PT recommendation for SNF. Pt okay with SNF workup to facilities accepting COVID positive pts. She reports she lives with her son Severina, [place] in [place] and consents to [place] speaking with family.   CSW called [facility] and discussed PT recommendation for SNF. Son is okay with recommendation and referrals to COVID positive facilities. Son requests that [place] email details of discussion to [email or web address]   SNF workup completed. Details emailed to pt son.   Expected Discharge Plan: Skilled Nursing Facility Barriers to Discharge: SNF Pending bed offer   Patient Goals and CMS Choice Patient states their goals for this hospitalization and ongoing recovery are:: Okay with SNF recomendation   Choice offered to / list presented to : Patient  Expected Discharge Plan and Services Expected Discharge Plan: [place] Choice: [place] Living arrangements for the past 2 months: Single Family Home                                      Prior Living Arrangements/Services Living arrangements for the past 2 months: Single Family Home Lives with:: Adult Children Patient language and need for interpreter reviewed:: Yes        Need for Family Participation in Patient Care: Yes (Comment) Care giver support system in place?: No (comment)   Criminal Activity/Legal Involvement Pertinent to Current Situation/Hospitalization: No - Comment as needed  Activities of Daily Living Home Assistive Devices/Equipment: None ADL Screening (condition at time of admission) Patient's cognitive ability adequate to safely complete  daily activities?: No Is the patient deaf or have difficulty hearing?: Yes Does the patient have difficulty seeing, even when wearing glasses/contacts?: Yes Does the patient have difficulty concentrating, remembering, or making decisions?: Yes Patient able to express need for assistance with ADLs?: No Does the patient have difficulty dressing or bathing?: No Independently performs ADLs?: No Communication: Independent Dressing (OT): Needs assistance Is this a change from baseline?: Change from baseline, expected to last <3days Grooming: Needs assistance Is this a change from baseline?: Change from baseline, expected to last <3 days Feeding: Independent Bathing: Needs assistance Is this a change from baseline?: Change from baseline, expected to last <3 days Toileting: Needs assistance Is this a change from baseline?: Change from baseline, expected to last <3 days In/Out Bed: Needs assistance Is this a change from baseline?: Change from baseline, expected to last <3 days Walks in Home: Needs assistance Is this a change from baseline?: Change from baseline, expected to last <3 days Does the patient have difficulty walking or climbing stairs?: Yes Weakness of Legs: Both Weakness of Arms/Hands: None  Permission Sought/Granted Permission sought to share information with : Family Supports Permission granted to share information with : Yes, Verbal Permission Granted  Share Information with NAME: Namrata, Dangler (Son) [phone] (Mobile)           Emotional Assessment   Attitude/Demeanor/Rapport: Engaged   Orientation: : Oriented to Self, Oriented to Place, Oriented to Situation, Oriented to  Time Alcohol /  Substance Use: Not Applicable Psych Involvement: No (comment)  Admission diagnosis:  COVID-19 virus infection [U07.1] COVID-19 [U07.1] Patient Active Problem List   Diagnosis Date Noted  . Acute respiratory failure with hypoxia ([place]) 09/14/2020  . Elevated AST (SGOT)  09/14/2020  . Hypocalcemia 09/14/2020  . Pneumonia due to COVID-19 virus 09/11/2020  . Degeneration of lumbar intervertebral disc 11/16/2019  . Long-term current use of opiate analgesic 11/16/2019  . Pain in right knee 07/03/2019  . H/O total shoulder replacement, left 06/09/2019  . Hip dislocation, right, initial encounter ([place]) 09/20/2018  . 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 Osteoarthritis of right hip 02/17/2018  . Chronic neck pain 01/03/2018  . Normocytic anemia 02/13/2016  . Lymphocytic colitis 03/14/2015  . Dyspnea 01/11/2014  . Venous insufficiency 08/23/2013  . Peripheral edema 08/23/2013  . Anxiety and depression 08/23/2013  . Hyponatremia 05/19/2013  . Cough, persistent 08/31/2012  . 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 OSTEOPENIA 02/18/2009  . Osteochondropathy 02/18/2009  . GOITER 06/02/2007  . Hypothyroidism 06/02/2007  . DEPRESSION 06/02/2007  . HYPERTENSION 06/02/2007  . ALLERGIC RHINITIS 06/02/2007  . GERD 06/02/2007  . INSOMNIA 06/02/2007   PCP:  Tammi Sou, MD Pharmacy:   [facility], [place] [address] [place] [address] Phone: [phone] Fax: [phone]  [place], [address], [address] [place], [place] [address] Phone: [phone] Fax: [phone]     Social Determinants of Health (SDOH) Interventions    Readmission Risk Interventions No flowsheet data found.

## 2020-09-18 NOTE — Progress Notes (Signed)
PROGRESS NOTE                                                                                                                                                                                                             Patient Demographics:    April Brewer, is a 74 y.o. female, DOB - 05-Dec-1946, TKW:[id number]  Outpatient Primary MD for the patient is McGowen, Adrian Blackwater, MD   Admit date - 09/25/2020   LOS - 4  Chief Complaint  Patient presents with  . Shortness of Breath       Brief Narrative: Patient is a 74 y.o. female with PMHx of HLD, hypothyroidism, GERD, lymphocytic colitis, anxiety/depression, chronic back pain on narcotics-presented with several days history of shortness of breath-found to have acute hypoxic respiratory failure secondary to COVID-19 pneumonia.  COVID-19 vaccinated status: Unvaccinated, she confirmed this today.  Significant Events: 9/18>> Admit to [facility] for severe hypoxia due to COVID-19 pneumonia  Significant studies: 9/17>>Chest x-ray: Diffuse interstitial and groundglass opacities  COVID-19 medications: Steroids:9/17>> Remdesivir: 9/17>> Baricitinib: 9/18>>  Antibiotics: Rocephin: 9/18 x 1 Zithromax: 9/18 x 1  Microbiology data: 9/17 >>blood culture: No growth  Procedures: None  Consults: None  DVT prophylaxis: enoxaparin (LOVENOX) injection 40 mg Start: 09/14/20 1000   Subjective:   She is sitting in recliner, reports some dyspnea with activity, remains on 12 L high flow nasal cannula.     Assessment  & Plan :   Acute Hypoxic Resp Failure due to Covid 19 Viral pneumonia:  - Continues to have severe hypoxemia-oxygen requirements continued to fluctuate between 10-15 L of HFNC.  Appears comfortable -Continue with IV Solu-Medrol. -With remdesivir. -Continue with baricitinib. -Lasix as needed, will hold today. -Continue  to trend inflammatory markers.  Fever:  afebrile O2 requirements:  SpO2: 96 % O2 Flow Rate (L/min): 13 L/min   COVID-19 Labs: Recent Labs    09/16/20 0500 09/17/20 0123 09/18/20 0730  DDIMER 1.28* 1.41* 1.89*  FERRITIN 384* 335* 414*  CRP 7.5* 9.1* 11.3*       Component Value Date/Time   BNP 229.9 (H) 09/14/2020 1305    Recent Labs  Lab 09/07/2020 2208  PROCALCITON 0.16    Lab Results  Component Value Date   SARSCOV2NAA POSITIVE (A) 09/25/2020   SARSCOV2NAA NOT DETECTED 06/06/2019     Prone/Incentive Spirometry: encouraged patient  to lie prone for 3-4 hours at a time for a total of 16 hours a day, and to encourage incentive spirometry use 3-4/hour.  Transaminitis: Secondary to COVID-19-mild-continue to monitor.  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  Follow chemistries-if sodium levels decreased significantly-may need to stop Zoloft  HLD: Continue statin  Hypothyroidism: Continue Synthroid-most recent TSH within normal limits.  Anxiety/depression: Appears stable-resume Xanax, Zoloft and lamotrigine.  Chronic back pain: Stable-continue as needed narcotics.  Goals of care discussion: Continue full scope of treatment- short of intubation.  Given her tenuous situation-after extensive discussion with son/patient-we all agree that if patient were to deteriorate significantly-we will not escalate care to include intubation/mechanical ventilation-DNR order placed on 9/20  GI prophylaxis:H2 Blocker  ABG:    Component Value Date/Time   TCO2 22 02/03/2015 0326    Vent Settings: N/A  Condition - Extremely Guarded  Family Communication  :  Son (Thomas [phone]) updated on 9/21   Code Status :  Full Code>> DNR-after extensive discussion with patient's son over the phone on 9/20.   Diet :  Diet Order            Diet regular Room service appropriate? Yes; Fluid consistency: Thin; Fluid  restriction: 1500 mL Fluid  Diet effective now                  Disposition Plan  :   Status is: Inpatient  Remains inpatient appropriate because:Inpatient level of care appropriate due to severity of illness   Dispo: The patient is from: Home              Anticipated d/c is to: Home              Anticipated d/c date is: > 3 days              Patient currently is not medically stable to d/c.  Barriers to discharge: Hypoxia requiring O2 supplementation/complete 5 days of IV Remdesivir  Antimicorbials  :    Anti-infectives (From admission, onward)   Start     Dose/Rate Route Frequency Ordered Stop   09/14/20 2200  cefTRIAXone (ROCEPHIN) 2 g in sodium chloride 0.9 % 100 mL IVPB  Status:  Discontinued        2 g 200 mL/hr over 30 Minutes Intravenous Every 24 hours 09/14/20 2043 09/15/20 1102   09/14/20 2200  azithromycin (ZITHROMAX) 500 mg in sodium chloride 0.9 % 250 mL IVPB  Status:  Discontinued        500 mg 250 mL/hr over 60 Minutes Intravenous Every 24 hours 09/14/20 2043 09/15/20 1102   09/14/20 1000  remdesivir 100 mg in sodium chloride 0.9 % 100 mL IVPB       "Followed by" Linked Group Details   100 mg 200 mL/hr over 30 Minutes Intravenous Daily 09/22/2020 2306 09/17/20 0907   09/17/2020 2330  remdesivir 100 mg in sodium chloride 0.9 % 100 mL IVPB       "Followed by" Linked Group Details   100 mg 200 mL/hr over 30 Minutes Intravenous Every 30 min 09/09/2020 2306 09/14/20 0033      Inpatient Medications  Scheduled Meds: . acidophilus  1 capsule Oral Daily  . albuterol  2 puff Inhalation Q6H  . alprazolam  1 mg Oral BID  . vitamin C  500 mg Oral Daily  . atorvastatin  10 mg Oral  Daily  . baricitinib  4 mg Oral Q24H  . enoxaparin (LOVENOX) injection  40 mg Subcutaneous Q24H  . famotidine  20 mg Oral BID  . feeding supplement (ENSURE ENLIVE)  237 mL Oral BID BM  . lamoTRIgine  100 mg Oral Daily  . levothyroxine  112.5 mcg Oral Once per day on Mon Fri  . levothyroxine   75 mcg Oral Once per day on Sun Tue Wed Thu Sat  . mouth rinse  15 mL Mouth Rinse BID  . methylPREDNISolone (SOLU-MEDROL) injection  70 mg Intravenous Q12H  . pantoprazole  40 mg Oral Q1200  . sertraline  100 mg Oral BID  . sodium chloride flush  3 mL Intravenous Q12H  . zinc sulfate  220 mg Oral Daily   Continuous Infusions: . sodium chloride Stopped (09/15/20 0004)  . sodium chloride Stopped (09/14/20 2233)   PRN Meds:.sodium chloride, sodium chloride, acetaminophen, chlorpheniramine-HYDROcodone, guaiFENesin-dextromethorphan, methocarbamol, ondansetron **OR** ondansetron (ZOFRAN) IV, oxyCODONE, phenol    See all Orders from today for further details   Phillips Climes M.D on 09/18/2020 at 4:31 PM  To page go to [email or web address] - use universal password  [facility] -  Office  [phone]    Objective:   Vitals:   09/18/20 0441 09/18/20 0800 09/18/20 1153 09/18/20 1600  BP: 108/69 107/64 (!) 97/56 110/68  Pulse: 64 73 81 76  Resp: 17 17 20  (!) 22  Temp: 98 F (36.7 C) 97.8 F (36.6 C) 97.6 F (36.4 C) 98.4 F (36.9 C)  TempSrc: Oral Oral Oral Oral  SpO2: 95% 90% 92% 96%  Weight:      Height:        Wt Readings from Last 3 Encounters:  09/24/2020 66.2 kg  09/04/20 69.2 kg  07/24/20 69.7 kg     Intake/Output Summary (Last 24 hours) at 09/18/2020 1631 Last data filed at 09/18/2020 1500 Gross per 24 hour  Intake 1010 ml  Output 1 ml  Net 1009 ml     Physical Exam Awake Alert, Oriented X 3, No new F.N deficits, Normal affect Symmetrical Chest wall movement, Good air movement bilaterally, CTAB RRR,No Gallops,Rubs or new Murmurs, No Parasternal Heave +ve B.Sounds, Abd Soft, No tenderness, No rebound - guarding or rigidity. No Cyanosis, Clubbing or edema, No new Rash or bruise      Data Review:    CBC Recent Labs  Lab 09/07/2020 2208 09/15/20 0324 09/16/20 0500 09/17/20 0123 09/18/20 0730  WBC 5.3 5.5 8.2 6.7 9.5  HGB 11.1* 10.2* 10.6* 9.7*  11.4*  HCT 32.1* 30.2* 31.5* 28.7* 33.6*  PLT 247 274 357 317 419*  MCV 88.9 89.9 91.6 90.8 89.1  MCH 30.7 30.4 30.8 30.7 30.2  MCHC 34.6 33.8 33.7 33.8 33.9  RDW 14.2 14.6 15.0 14.7 14.6  LYMPHSABS 0.8 1.1 1.5 0.7 1.6  MONOABS 0.4 0.4 0.7 0.2 0.8  EOSABS 0.0 0.0 0.0 0.0 0.0  BASOSABS 0.0 0.0 0.0 0.0 0.0    Chemistries  Recent Labs  Lab 09/03/2020 2208 09/14/2020 2208 09/14/20 1305 09/15/20 0324 09/16/20 0500 09/17/20 0123 09/18/20 0730  NA 126*   < > 132* 130* 133* 129* 130*  K 3.4*   < > 3.7 4.2 4.6 4.5 4.3  CL 92*   < > 100 99 101 97* 92*  CO2 23   < > 22 22 24 23 27   GLUCOSE 124*   < > 172* 154* 119* 146* 121*  BUN 11   < > 8 12 13  12  16  CREATININE 0.70   < > 0.84 0.76 0.89 0.91 0.87  CALCIUM 8.2*   < > 8.5* 8.5* 8.7* 8.3* 9.0  MG  --   --  1.6* 1.9 1.8 1.7 2.3  AST 64*  --   --  45* 39 39 43*  ALT 27  --   --  24 23 23 29   ALKPHOS 107  --   --  95 102 98 110  BILITOT 0.4  --   --  0.4 0.5 0.7 0.6   < > = values in this interval not displayed.   ------------------------------------------------------------------------------------------------------------------ No results for input(s): CHOL, HDL, LDLCALC, TRIG, CHOLHDL, LDLDIRECT in the last 72 hours.  No results found for: HGBA1C ------------------------------------------------------------------------------------------------------------------ No results for input(s): TSH, T4TOTAL, T3FREE, THYROIDAB in the last 72 hours.  Invalid input(s): FREET3 ------------------------------------------------------------------------------------------------------------------ Recent Labs    09/17/20 0123 09/18/20 0730  FERRITIN 335* 414*    Coagulation profile No results for input(s): INR, PROTIME in the last 168 hours.  Recent Labs    09/17/20 0123 09/18/20 0730  DDIMER 1.41* 1.89*    Cardiac Enzymes No results for input(s): CKMB, TROPONINI, MYOGLOBIN in the last 168 hours.  Invalid input(s):  CK ------------------------------------------------------------------------------------------------------------------    Component Value Date/Time   BNP 229.9 (H) 09/14/2020 1305    Micro Results Recent Results (from the past 240 hour(s))  Blood Culture (routine x 2)     Status: None (Preliminary result)   Collection Time: 09/19/2020 10:00 PM   Specimen: BLOOD  Result Value Ref Range Status   Specimen Description   Final    BLOOD LEFT ANTECUBITAL Performed at [facility], [place] [address] [place], [place] [address]    Special Requests   Final    BOTTLES DRAWN AEROBIC AND ANAEROBIC Blood Culture adequate volume Performed at [facility], [address], [place], [place] [address]    Culture   Final    NO GROWTH 4 DAYS Performed at [facility], [place] [address] [place], [place] [address]    Report Status PENDING  Incomplete  SARS Coronavirus 2 by RT PCR (hospital order, performed in [facility] hospital lab) Nasopharyngeal Nasopharyngeal Swab     Status: Abnormal   Collection Time: 09/07/2020 10:07 PM   Specimen: Nasopharyngeal Swab  Result Value Ref Range Status   SARS Coronavirus 2 POSITIVE (A) NEGATIVE Final    Comment: RESULT CALLED TO, READ BACK BY AND VERIFIED WITH: NEAL,K, RN @ 4132 09/10/2020 BY GWYN,P (NOTE) SARS-CoV-2 target nucleic acids are DETECTED  SARS-CoV-2 RNA is generally detectable in upper respiratory specimens  during the acute phase of infection.  Positive results are indicative  of the presence of the identified virus, but do not rule out bacterial infection or co-infection with other pathogens not detected by the test.  Clinical correlation with patient history and  other diagnostic information is necessary to determine patient infection status.  The expected result is negative.  Fact Sheet for Patients:   StrictlyIdeas.no   Fact Sheet for Healthcare Providers:   BankingDealers.co.za     This test is not yet approved or cleared by the [place] FDA and  has been authorized for detection and/or diagnosis of SARS-CoV-2 by FDA under an Emergency Use Authorization (EUA).  This EUA will remain in effect (meaning this t est can be used) for the duration of  the COVID-19 declaration under Section 564(b)(1) of the Act, 21 U.S.C. section 360-bbb-3(b)(1), unless the authorization is terminated or  revoked sooner.  Performed at [facility], [address] [place], [place] [address]   MRSA PCR Screening     Status: None   Collection Time: 09/15/20 12:14 AM   Specimen: Nasal Mucosa; Nasopharyngeal  Result Value Ref Range Status   MRSA by PCR NEGATIVE NEGATIVE Final    Comment:        The GeneXpert MRSA Assay (FDA approved for NASAL specimens only), is one component of a comprehensive MRSA colonization surveillance program. It is not intended to diagnose MRSA infection nor to guide or monitor treatment for MRSA infections. Performed at [facility], [place] [address], [place], [place] [address]     Radiology Reports DG Chest Portable 1 View  Result Date: 09/26/2020 CLINICAL DATA:  Cough, congestion, fever, loss of taste and smell for 1 week EXAM: PORTABLE CHEST 1 VIEW COMPARISON:  01/11/2014 FINDINGS: Single frontal view of the chest demonstrates stable enlarged cardiac silhouette. There is diffuse increased interstitial prominence with bilateral ground-glass consolidation. No effusion or pneumothorax. No acute bony abnormalities. Bilateral shoulder arthroplasties. IMPRESSION: 1. Diffuse interstitial and ground-glass opacities, consistent with bilateral atypical pneumonia given clinical history. Electronically Signed   By: Randa Ngo M.D.   On: 09/14/2020 23:33

## 2020-09-18 NOTE — NC FL2 (Addendum)
[place] LEVEL OF CARE SCREENING TOOL     IDENTIFICATION  Patient Name: April Brewer Birthdate: 11-Jun-1946 Sex: female Admission Date (Current Location): 09/01/2020  [facility] and [place] Number:  Herbalist and Address:         [facility]  [place], [address] Provider Number: [id number]  Attending Physician Name and Address:  Elgergawy, Silver Huguenin, MD  Relative Name and Phone Number:  Kaleen, Rochette) [phone] [facility])    Current Level of Care: Hospital Recommended Level of Care: [place] Prior Approval Number:    Date Approved/Denied:   PASRR Number:    Discharge Plan: SNF    Current Diagnoses: Patient Active Problem List   Diagnosis Date Noted  . Acute respiratory failure with hypoxia ([place]) 09/14/2020  . Elevated AST (SGOT) 09/14/2020  . Hypocalcemia 09/14/2020  . Pneumonia due to COVID-19 virus 09/20/2020  . Degeneration of lumbar intervertebral disc 11/16/2019  . Long-term current use of opiate analgesic 11/16/2019  . Pain in right knee 07/03/2019  . H/O total shoulder replacement, left 06/09/2019  . Hip dislocation, right, initial encounter ([place]) 09/20/2018  . 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 Osteoarthritis of right hip 02/17/2018  . Chronic neck pain 01/03/2018  . Normocytic anemia 02/13/2016  . Lymphocytic colitis 03/14/2015  . Dyspnea 01/11/2014  . Venous insufficiency 08/23/2013  . Peripheral edema 08/23/2013  . Anxiety and depression 08/23/2013  . Hyponatremia 05/19/2013  . Cough, persistent 08/31/2012  . 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 OSTEOPENIA 02/18/2009  . Osteochondropathy 02/18/2009  . GOITER 06/02/2007  . Hypothyroidism 06/02/2007  . DEPRESSION 06/02/2007  . HYPERTENSION 06/02/2007  . ALLERGIC RHINITIS 06/02/2007  . GERD 06/02/2007  . INSOMNIA 06/02/2007    Orientation RESPIRATION BLADDER Height & Weight     Self, Time, Situation, Place  O2 (HFNC  10L) External catheter Weight: 66.2 kg Height:  5\' 4"  (162.6 cm)  BEHAVIORAL SYMPTOMS/MOOD NEUROLOGICAL BOWEL NUTRITION STATUS   (none)  (none) Continent Diet (see d/c summary)  AMBULATORY STATUS COMMUNICATION OF NEEDS Skin   Extensive Assist Verbally Normal                       Personal Care Assistance Level of Assistance  Dressing, Feeding, Bathing Bathing Assistance: Maximum assistance Feeding assistance: Independent Dressing Assistance: Maximum assistance     Functional Limitations Info  Sight, Hearing, Speech Sight Info: Adequate Hearing Info: Adequate Speech Info: Adequate    SPECIAL CARE FACTORS FREQUENCY  PT (By licensed PT), OT (By licensed OT)     PT Frequency: 5x/week OT Frequency: 5x/week            Contractures Contractures Info: Not present    Additional Factors Info  Code Status, Allergies Code Status Info: DNR Allergies Info: Latex           Current Medications (09/18/2020):  This is the current hospital active medication list Current Facility-Administered Medications  Medication Dose Route Frequency Provider Last Rate Last Admin  . 0.9 %  sodium chloride infusion   Intravenous PRN Norval Morton, MD   Stopped at 09/15/20 0004  . 0.9 %  sodium chloride infusion   Intravenous PRN Norval Morton, MD   Stopped at 09/14/20 2233  . acetaminophen (TYLENOL) tablet 650 mg  650 mg Oral Q6H PRN Fuller Plan A, MD   650 mg at 09/14/20 2005  . acidophilus (RISAQUAD) capsule 1 capsule  1 capsule Oral Daily Fuller Plan A, MD   1 capsule at 09/18/20 0908  . albuterol (VENTOLIN HFA) 108 (90 Base) MCG/ACT inhaler 2 puff  2 puff Inhalation Q6H Fuller Plan A, MD   2 puff at 09/18/20 0906  . ALPRAZolam Duanne Moron) tablet 1 mg  1 mg Oral BID Jonetta Osgood, MD      . ascorbic acid (VITAMIN C) tablet 500 mg  500 mg Oral Daily Tamala Julian, Rondell A, MD   500 mg at 09/18/20 0906  . atorvastatin (LIPITOR) tablet 10 mg  10 mg Oral Daily Jonetta Osgood, MD    10 mg at 09/18/20 0907  . baricitinib (OLUMIANT) tablet 4 mg  4 mg Oral Q24H Fuller Plan A, MD   4 mg at 09/17/20 2110  . chlorpheniramine-HYDROcodone (TUSSIONEX) 10-8 MG/5ML suspension 5 mL  5 mL Oral Q12H PRN Fuller Plan A, MD   5 mL at 09/16/20 0034  . enoxaparin (LOVENOX) injection 40 mg  40 mg Subcutaneous Q24H Fuller Plan A, MD   40 mg at 09/18/20 0907  . famotidine (PEPCID) tablet 20 mg  20 mg Oral BID Fuller Plan A, MD   20 mg at 09/18/20 0907  . feeding supplement (ENSURE ENLIVE) (ENSURE ENLIVE) liquid 237 mL  237 mL Oral BID BM Smith, Rondell A, MD   237 mL at 09/18/20 0908  . guaiFENesin-dextromethorphan (ROBITUSSIN DM) 100-10 MG/5ML syrup 10 mL  10 mL Oral Q4H PRN Fuller Plan A, MD   10 mL at 09/15/20 2122  . lamoTRIgine (LAMICTAL) tablet 100 mg  100 mg Oral Daily Jonetta Osgood, MD   100 mg at 09/18/20 0907  . levothyroxine (SYNTHROID) tablet 112.5 mcg  112.5 mcg Oral Once per day on Mon Fri Joselyn Glassman A, RPH   112.5 mcg at 09/16/20 9470  . levothyroxine (SYNTHROID) tablet 75 mcg  75 mcg Oral Once per day on Sun Tue Wed Thu Sat Jonetta Osgood, MD   75 mcg at 09/18/20 0517  . MEDLINE mouth rinse  15 mL Mouth Rinse BID Fuller Plan A, MD   15 mL at 09/18/20 0908  . methocarbamol (ROBAXIN) tablet 750 mg  750 mg Oral Q8H PRN Ghimire, Henreitta Leber, MD      . methylPREDNISolone sodium succinate (SOLU-MEDROL) 125 mg/2 mL injection 70 mg  70 mg Intravenous Q12H Jonetta Osgood, MD   70 mg at 09/18/20 0907  . ondansetron (ZOFRAN) tablet 4 mg  4 mg Oral Q6H PRN Fuller Plan A, MD   4 mg at 09/14/20 2011   Or  . ondansetron (ZOFRAN) injection 4 mg  4 mg Intravenous Q6H PRN Fuller Plan A, MD   4 mg at 09/17/20 1022  . oxyCODONE (Oxy IR/ROXICODONE) immediate release tablet 10 mg  10 mg Oral BID PRN Jonetta Osgood, MD      . pantoprazole (PROTONIX) EC tablet 40 mg  40 mg Oral Q1200 Jonetta Osgood, MD   40 mg at 09/17/20 1111  . phenol (CHLORASEPTIC) mouth  spray 1 spray  1 spray Mouth/Throat PRN Norval Morton, MD   1 spray at 09/15/20 0313  . sertraline (ZOLOFT) tablet 100 mg  100 mg Oral BID Jonetta Osgood, MD   100 mg at 09/18/20 0906  . sodium chloride flush (NS) 0.9 % injection 3 mL  3 mL Intravenous Q12H Smith, Rondell A, MD   3 mL at 09/18/20 0908  . zinc sulfate capsule 220 mg  220 mg Oral  Daily Norval Morton, MD   220 mg at 09/18/20 7867     Discharge Medications: Please see discharge summary for a list of discharge medications.  Relevant Imaging Results:  Relevant Lab Results:   Additional Information SSN [id number]  Bethann Berkshire, LCSW

## 2020-09-18 NOTE — Plan of Care (Signed)
  Problem: Education: Goal: Knowledge of risk factors and measures for prevention of condition will improve Outcome: Progressing   Problem: Respiratory: Goal: Will maintain a patent airway Outcome: Progressing   Problem: Education: Goal: Knowledge of General Education information will improve Description: Including pain rating scale, medication(s)/side effects and non-pharmacologic comfort measures Outcome: Progressing   Problem: Health Behavior/Discharge Planning: Goal: Ability to manage health-related needs will improve Outcome: Progressing   Problem: Clinical Measurements: Goal: Respiratory complications will improve Outcome: Progressing   Problem: Activity: Goal: Risk for activity intolerance will decrease Outcome: Progressing   Problem: Nutrition: Goal: Adequate nutrition will be maintained Outcome: Progressing   

## 2020-09-18 NOTE — Progress Notes (Signed)
       RE:  April Brewer       Date of Birth:  12-13-2046     Date:  09/18/20        To Whom It May Concern:  Please be advised that the above-named patient will require a short-term nursing home stay - anticipated 30 days or less for rehabilitation and strengthening.  The plan is for return home.                 MD signature             09/18/20   Date

## 2020-09-19 LAB — CBC WITH DIFFERENTIAL/PLATELET
Abs Immature Granulocytes: 0.07 10*3/uL (ref 0.00–0.07)
Basophils Absolute: 0 10*3/uL (ref 0.0–0.1)
Basophils Relative: 0 %
Eosinophils Absolute: 0 10*3/uL (ref 0.0–0.5)
Eosinophils Relative: 0 %
HCT: 30.8 % — ABNORMAL LOW (ref 36.0–46.0)
Hemoglobin: 10.4 g/dL — ABNORMAL LOW (ref 12.0–15.0)
Immature Granulocytes: 1 %
Lymphocytes Relative: 7 %
Lymphs Abs: 0.8 10*3/uL (ref 0.7–4.0)
MCH: 30.1 pg (ref 26.0–34.0)
MCHC: 33.8 g/dL (ref 30.0–36.0)
MCV: 89.3 fL (ref 80.0–100.0)
Monocytes Absolute: 0.5 10*3/uL (ref 0.1–1.0)
Monocytes Relative: 4 %
Neutro Abs: 10.3 10*3/uL — ABNORMAL HIGH (ref 1.7–7.7)
Neutrophils Relative %: 88 %
Platelets: 355 10*3/uL (ref 150–400)
RBC: 3.45 MIL/uL — ABNORMAL LOW (ref 3.87–5.11)
RDW: 14.5 % (ref 11.5–15.5)
WBC: 11.7 10*3/uL — ABNORMAL HIGH (ref 4.0–10.5)
nRBC: 0 % (ref 0.0–0.2)

## 2020-09-19 LAB — COMPREHENSIVE METABOLIC PANEL
ALT: 33 U/L (ref 0–44)
AST: 43 U/L — ABNORMAL HIGH (ref 15–41)
Albumin: 2.4 g/dL — ABNORMAL LOW (ref 3.5–5.0)
Alkaline Phosphatase: 91 U/L (ref 38–126)
Anion gap: 10 (ref 5–15)
BUN: 23 mg/dL (ref 8–23)
CO2: 25 mmol/L (ref 22–32)
Calcium: 8.6 mg/dL — ABNORMAL LOW (ref 8.9–10.3)
Chloride: 93 mmol/L — ABNORMAL LOW (ref 98–111)
Creatinine, Ser: 0.91 mg/dL (ref 0.44–1.00)
GFR calc Af Amer: >60 mL/min (ref >60)
GFR calc non Af Amer: >60 mL/min (ref >60)
Glucose, Bld: 109 mg/dL — ABNORMAL HIGH (ref 70–99)
Potassium: 4.8 mmol/L (ref 3.5–5.1)
Sodium: 128 mmol/L — ABNORMAL LOW (ref 135–145)
Total Bilirubin: 0.7 mg/dL (ref 0.3–1.2)
Total Protein: 6.1 g/dL — ABNORMAL LOW (ref 6.5–8.1)

## 2020-09-19 LAB — CULTURE, BLOOD (ROUTINE X 2)
Culture: NO GROWTH
Special Requests: ADEQUATE

## 2020-09-19 LAB — D-DIMER, QUANTITATIVE: D-Dimer, Quant: 2.32 ug/mL-FEU — ABNORMAL HIGH (ref 0.00–0.50)

## 2020-09-19 LAB — FERRITIN: Ferritin: 288 ng/mL (ref 11–307)

## 2020-09-19 LAB — MAGNESIUM: Magnesium: 2 mg/dL (ref 1.7–2.4)

## 2020-09-19 LAB — C-REACTIVE PROTEIN: CRP: 5.6 mg/dL — ABNORMAL HIGH (ref <1.0)

## 2020-09-19 MED ORDER — FUROSEMIDE 10 MG/ML IJ SOLN
40.0000 mg | Freq: Once | INTRAMUSCULAR | Status: AC
  Administered 2020-09-19: 40 mg via INTRAVENOUS
  Filled 2020-09-19: qty 4

## 2020-09-19 NOTE — Progress Notes (Signed)
PROGRESS NOTE                                                                                                                                                                                                             Patient Demographics:    April Brewer, is a 74 y.o. female, DOB - 1946/04/06, FOY:[id number]  Outpatient Primary MD for the patient is McGowen, Adrian [place], MD   Admit date - 09/07/2020   LOS - 5  Chief Complaint  Patient presents with  . Shortness of Breath       Brief Narrative: Patient is a 74 y.o. female with PMHx of HLD, hypothyroidism, GERD, lymphocytic colitis, anxiety/depression, chronic back pain on narcotics-presented with several days history of shortness of breath-found to have acute hypoxic respiratory failure secondary to COVID-19 pneumonia.  COVID-19 vaccinated status: Unvaccinated, she confirmed this today.  Significant Events: 9/18>> Admit to [facility] for severe hypoxia due to COVID-19 pneumonia  Significant studies: 9/17>>Chest x-ray: Diffuse interstitial and groundglass opacities  COVID-19 medications: Steroids:9/17>> Remdesivir: 9/17>> Baricitinib: 9/18>>  Antibiotics: Rocephin: 9/18 x 1 Zithromax: 9/18 x 1  Microbiology data: 9/17 >>blood culture: No growth  Procedures: None  Consults: None  DVT prophylaxis: enoxaparin (LOVENOX) injection 40 mg Start: 09/14/20 1000   Subjective:   Patient reports dyspnea at baseline, reports some mild cough, reports appetite is improved.   Assessment  & Plan :   Acute Hypoxic Resp Failure due to Covid 19 Viral pneumonia:  - Continues to have severe hypoxemia-oxygen requirements continued to fluctuate between 10-15 L of HFNC.  Appears comfortable.  This morning she is down to 8 L. -Continue with IV Solu-Medrol. -treated With remdesivir. -Continue with baricitinib. -Lasix as needed, he has some trace edema, will give 40 mg of IV Lasix  today. -Continue  to trend inflammatory markers.   D-dimers trending up, will continue to monitor closely  Fever: afebrile O2 requirements:  SpO2: 97 % O2 Flow Rate (L/min): 8 L/min   COVID-19 Labs: Recent Labs    09/17/20 0123 09/18/20 0730 09/19/20 0616  DDIMER 1.41* 1.89* 2.32*  FERRITIN 335* 414* 288  CRP 9.1* 11.3* 5.6*       Component Value Date/Time   BNP 229.9 (H) 09/14/2020 1305    Recent Labs  Lab 09/18/2020 2208  PROCALCITON 0.16    Lab Results  Component Value Date   SARSCOV2NAA POSITIVE (A) 09/24/2020   SARSCOV2NAA NOT DETECTED 06/06/2019     Prone/Incentive Spirometry: encouraged patient to lie prone for 3-4 hours at a time for a total of 16 hours a day, and to encourage incentive spirometry use 3-4/hour.  Transaminitis: Secondary to COVID-19-mild-continue to monitor.  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 -  Follow chemistries-if sodium levels decreased significantly-may need to stop Zoloft  HLD: Continue statin  Hypothyroidism: Continue Synthroid-most recent TSH within normal limits.  Anxiety/depression: Appears stable-resume Xanax, Zoloft and lamotrigine.  Chronic back pain: Stable-continue as needed narcotics.  Goals of care discussion: Continue full scope of treatment- short of intubation.  Given her tenuous situation-after extensive discussion with son/patient-we all agree that if patient were to deteriorate significantly-we will not escalate care to include intubation/mechanical ventilation-DNR order placed on 9/20  GI prophylaxis:H2 Blocker  ABG:    Component Value Date/Time   TCO2 22 02/03/2015 0326    Vent Settings: N/A  Condition - Extremely Guarded  Family Communication  :  Son (Thomas [phone]) updated on 9/23   Code Status :  Full Code>> DNR-after extensive discussion with patient's son over the phone on 9/20.   Diet :  Diet Order              Diet regular Room service appropriate? Yes; Fluid consistency: Thin; Fluid restriction: 1500 mL Fluid  Diet effective now                  Disposition Plan  :   Status is: Inpatient  Remains inpatient appropriate because:Inpatient level of care appropriate due to severity of illness   Dispo: The patient is from: Home              Anticipated d/c is to: Home              Anticipated d/c date is: > 3 days              Patient currently is not medically stable to d/c.  Barriers to discharge: Hypoxia requiring O2 supplementation/complete 5 days of IV Remdesivir  Antimicorbials  :    Anti-infectives (From admission, onward)   Start     Dose/Rate Route Frequency Ordered Stop   09/14/20 2200  cefTRIAXone (ROCEPHIN) 2 g in sodium chloride 0.9 % 100 mL IVPB  Status:  Discontinued        2 g 200 mL/hr over 30 Minutes Intravenous Every 24 hours 09/14/20 2043 09/15/20 1102   09/14/20 2200  azithromycin (ZITHROMAX) 500 mg in sodium chloride 0.9 % 250 mL IVPB  Status:  Discontinued        500 mg 250 mL/hr over 60 Minutes Intravenous Every 24 hours 09/14/20 2043 09/15/20 1102   09/14/20 1000  remdesivir 100 mg in sodium chloride 0.9 % 100 mL IVPB       "Followed by" Linked Group Details   100 mg 200 mL/hr over 30 Minutes Intravenous Daily 09/24/2020 2306 09/17/20 0907   09/05/2020 2330  remdesivir 100 mg in sodium chloride 0.9 % 100 mL IVPB       "Followed by" Linked Group Details   100 mg 200 mL/hr over 30 Minutes Intravenous Every 30 min 09/12/2020 2306 09/14/20 0033      Inpatient Medications  Scheduled Meds: . acidophilus  1 capsule Oral Daily  . albuterol  2 puff Inhalation Q6H  . alprazolam  1 mg Oral BID  . vitamin C  500 mg Oral Daily  . atorvastatin  10 mg Oral Daily  . baricitinib  4 mg Oral Q24H  . enoxaparin (LOVENOX) injection  40 mg Subcutaneous Q24H  . famotidine  20 mg Oral BID  . feeding supplement (ENSURE ENLIVE)  237 mL Oral BID BM  . influenza vaccine  adjuvanted  0.5 mL Intramuscular Tomorrow-1000  . lamoTRIgine  100 mg Oral Daily  . levothyroxine  112.5 mcg Oral Once per day on Mon Fri  . levothyroxine  75 mcg Oral Once per day on Sun Tue Wed Thu Sat  . mouth rinse  15 mL Mouth Rinse BID  . methylPREDNISolone (SOLU-MEDROL) injection  70 mg Intravenous Q12H  . pantoprazole  40 mg Oral Q1200  . sertraline  100 mg Oral BID  . sodium chloride flush  3 mL Intravenous Q12H  . zinc sulfate  220 mg Oral Daily   Continuous Infusions: . sodium chloride Stopped (09/15/20 0004)  . sodium chloride Stopped (09/14/20 2233)   PRN Meds:.sodium chloride, sodium chloride, acetaminophen, chlorpheniramine-HYDROcodone, guaiFENesin-dextromethorphan, methocarbamol, ondansetron **OR** ondansetron (ZOFRAN) IV, oxyCODONE, phenol    See all Orders from today for further details   Phillips Climes M.D on 09/19/2020 at 11:52 AM  To page go to [email or web address] - use universal password  [facility] -  Office  [phone]    Objective:   Vitals:   09/18/20 2012 09/18/20 2350 09/19/20 0415 09/19/20 0744  BP: 117/70 118/76 127/74 117/67  Pulse: 78 81 76 77  Resp: 19 (!) 21 18 (!) 24  Temp: 98.1 F (36.7 C) 98.4 F (36.9 C) 98.1 F (36.7 C) 98.7 F (37.1 C)  TempSrc: Oral Oral Oral Oral  SpO2: 94% 91% 92% 97%  Weight:      Height:        Wt Readings from Last 3 Encounters:  09/17/2020 66.2 kg  09/04/20 69.2 kg  07/24/20 69.7 kg     Intake/Output Summary (Last 24 hours) at 09/19/2020 1152 Last data filed at 09/19/2020 0844 Gross per 24 hour  Intake 960 ml  Output --  Net 960 ml     Physical Exam Awake Alert, Oriented X 3, No new F.N deficits, Normal affect Symmetrical Chest wall movement, Good air movement bilaterally, CTAB RRR,No Gallops,Rubs or new Murmurs, No Parasternal Heave +ve B.Sounds, Abd Soft, No tenderness, No rebound - guarding or rigidity. No Cyanosis, Clubbing or edema, No new Rash or bruise        Data Review:     CBC Recent Labs  Lab 09/15/20 0324 09/16/20 0500 09/17/20 0123 09/18/20 0730 09/19/20 0616  WBC 5.5 8.2 6.7 9.5 11.7*  HGB 10.2* 10.6* 9.7* 11.4* 10.4*  HCT 30.2* 31.5* 28.7* 33.6* 30.8*  PLT 274 357 317 419* 355  MCV 89.9 91.6 90.8 89.1 89.3  MCH 30.4 30.8 30.7 30.2 30.1  MCHC 33.8 33.7 33.8 33.9 33.8  RDW 14.6 15.0 14.7 14.6 14.5  LYMPHSABS 1.1 1.5 0.7 1.6 0.8  MONOABS 0.4 0.7 0.2 0.8 0.5  EOSABS 0.0 0.0 0.0 0.0 0.0  BASOSABS 0.0 0.0 0.0 0.0 0.0    Chemistries  Recent Labs  Lab 09/15/20 0324 09/16/20 0500 09/17/20 0123 09/18/20 0730 09/19/20 0616  NA 130* 133* 129* 130* 128*  K 4.2 4.6 4.5 4.3 4.8  CL 99 101 97* 92* 93*  CO2 22 24 23 27 25   GLUCOSE 154* 119* 146* 121* 109*  BUN 12 13 12 16 23   CREATININE  0.76 0.89 0.91 0.87 0.91  CALCIUM 8.5* 8.7* 8.3* 9.0 8.6*  MG 1.9 1.8 1.7 2.3 2.0  AST 45* 39 39 43* 43*  ALT 24 23 23 29  33  ALKPHOS 95 102 98 110 91  BILITOT 0.4 0.5 0.7 0.6 0.7   ------------------------------------------------------------------------------------------------------------------ No results for input(s): CHOL, HDL, LDLCALC, TRIG, CHOLHDL, LDLDIRECT in the last 72 hours.  No results found for: HGBA1C ------------------------------------------------------------------------------------------------------------------ No results for input(s): TSH, T4TOTAL, T3FREE, THYROIDAB in the last 72 hours.  Invalid input(s): FREET3 ------------------------------------------------------------------------------------------------------------------ Recent Labs    09/18/20 0730 09/19/20 0616  FERRITIN 414* 288    Coagulation profile No results for input(s): INR, PROTIME in the last 168 hours.  Recent Labs    09/18/20 0730 09/19/20 0616  DDIMER 1.89* 2.32*    Cardiac Enzymes No results for input(s): CKMB, TROPONINI, MYOGLOBIN in the last 168 hours.  Invalid input(s):  CK ------------------------------------------------------------------------------------------------------------------    Component Value Date/Time   BNP 229.9 (H) 09/14/2020 1305    Micro Results Recent Results (from the past 240 hour(s))  Blood Culture (routine x 2)     Status: None (Preliminary result)   Collection Time: 08/30/2020 10:00 PM   Specimen: BLOOD  Result Value Ref Range Status   Specimen Description   Final    BLOOD LEFT ANTECUBITAL Performed at [facility], [place] [address] [place], [place] [address]    Special Requests   Final    BOTTLES DRAWN AEROBIC AND ANAEROBIC Blood Culture adequate volume Performed at [facility], [address], [place], [place] [address]    Culture   Final    NO GROWTH 4 DAYS Performed at [facility], [place] [address] [place], [place] [address]    Report Status PENDING  Incomplete  SARS Coronavirus 2 by RT PCR (hospital order, performed in [facility] hospital lab) Nasopharyngeal Nasopharyngeal Swab     Status: Abnormal   Collection Time: 09/06/2020 10:07 PM   Specimen: Nasopharyngeal Swab  Result Value Ref Range Status   SARS Coronavirus 2 POSITIVE (A) NEGATIVE Final    Comment: RESULT CALLED TO, READ BACK BY AND VERIFIED WITH: NEAL,K, RN @ 6010 09/22/2020 BY GWYN,P (NOTE) SARS-CoV-2 target nucleic acids are DETECTED  SARS-CoV-2 RNA is generally detectable in upper respiratory specimens  during the acute phase of infection.  Positive results are indicative  of the presence of the identified virus, but do not rule out bacterial infection or co-infection with other pathogens not detected by the test.  Clinical correlation with patient history and  other diagnostic information is necessary to determine patient infection status.  The expected result is negative.  Fact Sheet for Patients:   StrictlyIdeas.no   Fact Sheet for Healthcare Providers:   BankingDealers.co.za     This test is not yet approved or cleared by the [place] FDA and  has been authorized for detection and/or diagnosis of SARS-CoV-2 by FDA under an Emergency Use Authorization (EUA).  This EUA will remain in effect (meaning this t est can be used) for the duration of  the COVID-19 declaration under Section 564(b)(1) of the Act, 21 U.S.C. section 360-bbb-3(b)(1), unless the authorization is terminated or revoked sooner.  Performed at [facility], [address] [place], [place] [address]   MRSA PCR Screening     Status: None   Collection Time: 09/15/20 12:14 AM   Specimen: Nasal Mucosa; Nasopharyngeal  Result Value Ref Range Status   MRSA by PCR NEGATIVE NEGATIVE  Final    Comment:        The GeneXpert MRSA Assay (FDA approved for NASAL specimens only), is one component of a comprehensive MRSA colonization surveillance program. It is not intended to diagnose MRSA infection nor to guide or monitor treatment for MRSA infections. Performed at [facility], [place] [address] [place], [place] [address]     Radiology Reports DG Chest Portable 1 View  Result Date: 09/09/2020 CLINICAL DATA:  Cough, congestion, fever, loss of taste and smell for 1 week EXAM: PORTABLE CHEST 1 VIEW COMPARISON:  01/11/2014 FINDINGS: Single frontal view of the chest demonstrates stable enlarged cardiac silhouette. There is diffuse increased interstitial prominence with bilateral ground-glass consolidation. No effusion or pneumothorax. No acute bony abnormalities. Bilateral shoulder arthroplasties. IMPRESSION: 1. Diffuse interstitial and ground-glass opacities, consistent with bilateral atypical pneumonia given clinical history. Electronically Signed   By: Randa Ngo M.D.   On: 09/18/2020 23:33

## 2020-09-19 NOTE — TOC Progression Note (Signed)
Transition of Care [facility]) - Progression Note    Patient Details  Name: April Brewer MRN: [id number] Date of Birth: 17-Dec-1946  Transition of Care [facility]) CM/SW [place], RN Phone Number: 09/19/2020, 3:33 PM  Clinical Narrative:    Patient fluctuating 10-15 liters of oxygen. Patient a DNR, continue with escalation of care, but no intubation. Continue to monitor for needs.   Expected Discharge Plan: Skilled Nursing Facility Barriers to Discharge: SNF Pending bed offer  Expected Discharge Plan and Services Expected Discharge Plan: [place] Choice: [place] arrangements for the past 2 months: Single Family Home                                       Social Determinants of Health (SDOH) Interventions    Readmission Risk Interventions No flowsheet data found.

## 2020-09-20 LAB — CBC
HCT: 32.9 % — ABNORMAL LOW (ref 36.0–46.0)
Hemoglobin: 11.3 g/dL — ABNORMAL LOW (ref 12.0–15.0)
MCH: 30.5 pg (ref 26.0–34.0)
MCHC: 34.3 g/dL (ref 30.0–36.0)
MCV: 88.9 fL (ref 80.0–100.0)
Platelets: 404 10*3/uL — ABNORMAL HIGH (ref 150–400)
RBC: 3.7 MIL/uL — ABNORMAL LOW (ref 3.87–5.11)
RDW: 14.5 % (ref 11.5–15.5)
WBC: 11.8 10*3/uL — ABNORMAL HIGH (ref 4.0–10.5)
nRBC: 0 % (ref 0.0–0.2)

## 2020-09-20 LAB — COMPREHENSIVE METABOLIC PANEL
ALT: 35 U/L (ref 0–44)
AST: 40 U/L (ref 15–41)
Albumin: 2.7 g/dL — ABNORMAL LOW (ref 3.5–5.0)
Alkaline Phosphatase: 91 U/L (ref 38–126)
Anion gap: 11 (ref 5–15)
BUN: 20 mg/dL (ref 8–23)
CO2: 28 mmol/L (ref 22–32)
Calcium: 8.8 mg/dL — ABNORMAL LOW (ref 8.9–10.3)
Chloride: 90 mmol/L — ABNORMAL LOW (ref 98–111)
Creatinine, Ser: 0.89 mg/dL (ref 0.44–1.00)
GFR calc Af Amer: >60 mL/min (ref >60)
GFR calc non Af Amer: >60 mL/min (ref >60)
Glucose, Bld: 114 mg/dL — ABNORMAL HIGH (ref 70–99)
Potassium: 4.3 mmol/L (ref 3.5–5.1)
Sodium: 129 mmol/L — ABNORMAL LOW (ref 135–145)
Total Bilirubin: 0.6 mg/dL (ref 0.3–1.2)
Total Protein: 6.4 g/dL — ABNORMAL LOW (ref 6.5–8.1)

## 2020-09-20 LAB — C-REACTIVE PROTEIN: CRP: 4.7 mg/dL — ABNORMAL HIGH (ref <1.0)

## 2020-09-20 LAB — D-DIMER, QUANTITATIVE: D-Dimer, Quant: 2.28 ug/mL-FEU — ABNORMAL HIGH (ref 0.00–0.50)

## 2020-09-20 MED ORDER — ALPRAZOLAM 0.5 MG PO TABS: 0.5000 mg | ORAL_TABLET | Freq: Two times a day (BID) | ORAL | Status: DC

## 2020-09-20 NOTE — Progress Notes (Signed)
Physical Therapy Treatment Patient Details Name: April Brewer MRN: [id number] DOB: May 10, 1946 Today's Date: 09/20/2020    History of Present Illness Pt is 74 y.o. female with medical history significant of hyperlipidemia, hypothyroidism, GERD, lymphocytic colitis, anxiety, and depression presented with complaints of shortness of breath and cough over the last 2 - 3 days. Noted associated symptoms of fever, loss of taste/smell, poor appetite, nausea, generalized weakness, and diarrhea. COVID-19 screening was positive. Pt admitted with COVID 19 PNE resp failure and on high flow oxygen    PT Comments    Pt with very limited ability to participate today.  She was extremely lethargic and weak - had difficulty staying awake. She was on 15 L HFNC with sats 88-89% rest and dropping quickly to 80% with minimal activity.  Spoke with RN who reports pt became lethargic after receiving Xanax and she has notified MD.  PT session focused on some AROM exercises and sitting balance/posture to promote increased lung capacity.    Follow Up Recommendations  SNF     Equipment Recommendations  None recommended by PT    Recommendations for Other Services       Precautions / Restrictions Precautions Precautions: Fall Precaution Comments: watch sats    Mobility  Bed Mobility               General bed mobility comments: Pt in chair - she was too lethargic to participate in standing or transfers  Transfers                    Ambulation/Gait                 Stairs             Wheelchair Mobility    Modified Rankin (Stroke Patients Only)       Balance Overall balance assessment: Needs assistance Sitting-balance support: Feet supported;Bilateral upper extremity supported;Single extremity supported Sitting balance-Leahy Scale: Poor Sitting balance - Comments: requiring at least single UE support;  Had pt sit upright edge of chair 5 times for 20-45 seconds at a time.   Required tactile/verbal cues for posture (shoulders back, head up) and deep breathing       Standing balance comment: unable                            Cognition Arousal/Alertness: Lethargic Behavior During Therapy: Flat affect Overall Cognitive Status: Impaired/Different from baseline Area of Impairment: Orientation;Attention;Memory;Safety/judgement;Problem solving;Following commands;Awareness                 Orientation Level: Disoriented to;Time;Situation;Place Current Attention Level: Sustained   Following Commands: Follows one step commands inconsistently Safety/Judgement: Decreased awareness of safety;Decreased awareness of deficits   Problem Solving: Slow processing;Decreased initiation;Requires verbal cues;Difficulty sequencing General Comments: Pt extremely lethargic and difficulty focusing on task.      Exercises General Exercises - Lower Extremity Ankle Circles/Pumps: AROM;10 reps;Seated Long Arc Quad: AROM;10 reps;Seated Hip Flexion/Marching: AAROM;5 reps;Seated    General Comments General comments (skin integrity, edema, etc.): Pt was on 15 L HFNC.  O2 sats 88-89% rest and 80% lowest with activity.  Pt was extremely lethargic and required frequent cues to stay awake.  Additionally, O2 sats decreased significantly with minimal activity (LE AROM or sitting edge of chair).  Notified RN who reports pt more alert this morning but has since received xanax and became lethargic (she had let MD know and xanax has been decreased)  Pertinent Vitals/Pain Pain Assessment: No/denies pain    Home Living                      Prior Function            PT Goals (current goals can now be found in the care plan section) Progress towards PT goals: Not progressing toward goals - comment (limited due to lethargy)    Frequency    Min 3X/week      PT Plan Current plan remains appropriate    Co-evaluation              AM-PAC PT "6  Clicks" Mobility   Outcome Measure  Help needed turning from your back to your side while in a flat bed without using bedrails?: A Lot Help needed moving from lying on your back to sitting on the side of a flat bed without using bedrails?: A Lot Help needed moving to and from a bed to a chair (including a wheelchair)?: Total Help needed standing up from a chair using your arms (e.g., wheelchair or bedside chair)?: Total Help needed to walk in hospital room?: Total Help needed climbing 3-5 steps with a railing? : Total 6 Click Score: 8    End of Session Equipment Utilized During Treatment: Oxygen Activity Tolerance: Patient limited by lethargy Patient left: with call bell/phone within reach;in chair Nurse Communication: Mobility status (lethargy/O2 requirement) PT Visit Diagnosis: Muscle weakness (generalized) (M62.81);Difficulty in walking, not elsewhere classified (R26.2)     Time: 3559-7416 PT Time Calculation (min) (ACUTE ONLY): 13 min  Charges:  $Therapeutic Exercise: 8-22 mins                     Abran Richard, PT Acute Rehab Services Pager [phone] [facility] 09/20/2020, 5:11 PM

## 2020-09-20 NOTE — Progress Notes (Signed)
PROGRESS NOTE                                                                                                                                                                                                             Patient Demographics:    April Brewer, is a 74 y.o. female, DOB - [birth year], QQI:[id number]  Outpatient Primary MD for the patient is McGowen, Adrian Blackwater, MD   Admit date - 09/12/2020   LOS - 6  Chief Complaint  Patient presents with  . Shortness of Breath       Brief Narrative: Patient is a 74 y.o. female with PMHx of HLD, hypothyroidism, GERD, lymphocytic colitis, anxiety/depression, chronic back pain on narcotics-presented with several days history of shortness of breath-found to have acute hypoxic respiratory failure secondary to COVID-19 pneumonia.  COVID-19 vaccinated status: Unvaccinated, she confirmed this today.  Significant Events: 9/18>> Admit to [facility] for severe hypoxia due to COVID-19 pneumonia  Significant studies: 9/17>>Chest x-ray: Diffuse interstitial and groundglass opacities  COVID-19 medications: Steroids:9/17>> Remdesivir: 9/17>> Baricitinib: 9/18>>  Antibiotics: Rocephin: 9/18 x 1 Zithromax: 9/18 x 1  Microbiology data: 9/17 >>blood culture: No growth  Procedures: None  Consults: None  DVT prophylaxis: enoxaparin (LOVENOX) injection 40 mg Start: 09/14/20 1000   Subjective:   Patient reports dyspnea at baseline, she did report cough, poor generalized weakness .    Assessment  & Plan :   Acute Hypoxic Resp Failure due to Covid 19 Viral pneumonia:  - Continues to have severe hypoxemia-oxygen requirements continued to fluctuate between 10-15 L of HFNC.  This morning she is 10 L at rest, but had to go up to 15 L after she was moved from bed to chair. -Continue with IV Solu-Medrol. -treated With remdesivir. -Continue with baricitinib. -Lasix as needed, will hold on  Lasix today, when she has improved leg edema, will follow on BMP -Continue  to trend inflammatory markers.   D-dimers trending up, will continue to monitor closely  Fever: afebrile O2 requirements:  SpO2: 92 % O2 Flow Rate (L/min): 15 L/min   COVID-19 Labs: Recent Labs    09/18/20 0730 09/19/20 0616  DDIMER 1.89* 2.32*  FERRITIN 414* 288  CRP 11.3* 5.6*       Component Value Date/Time   BNP 229.9 (H) 09/14/2020 1305    Recent Labs  Lab  09/07/2020 2208  PROCALCITON 0.16    Lab Results  Component Value Date   SARSCOV2NAA POSITIVE (A) 09/05/2020   SARSCOV2NAA NOT DETECTED 06/06/2019     Prone/Incentive Spirometry: encouraged patient to lie prone for 3-4 hours at a time for a total of 16 hours a day, and to encourage incentive spirometry use 3-4/hour.  Transaminitis: Secondary to COVID-19-mild-continue to monitor.  Hyponatremia: - patient sodium was 128, she received Lasix yesterday, and she is on fluid restriction, will follow on sodium level today.   -  Follow chemistries-if sodium levels decreased significantly-may need to stop Zoloft  HLD: Continue statin  Hypothyroidism: Continue Synthroid-most recent TSH within normal limits.  Anxiety/depression:  - Appears stable-resume Xanax, Zoloft and lamotrigine. -Patient appears to be more sleepy today, will hold evening dose of Xanax, and will decrease her dose to 0.5 mg twice daily.  Chronic back pain: Stable-continue as needed narcotics.  Goals of care discussion: Continue full scope of treatment- short of intubation.  Given her tenuous situation-after extensive discussion with son/patient-we all agree that if patient were to deteriorate significantly-we will not escalate care to include intubation/mechanical ventilation-DNR order placed on 9/20  GI prophylaxis:H2 Blocker  ABG:    Component Value Date/Time   TCO2 22 02/03/2015 0326    Vent Settings: N/A  Condition - Extremely Guarded  Family Communication  :   Son (Thomas [phone]) updated daily   Code Status :  Full Code>> DNR-after extensive discussion with patient's son over the phone on 9/20.   Diet :  Diet Order            Diet regular Room service appropriate? Yes; Fluid consistency: Thin; Fluid restriction: 1500 mL Fluid  Diet effective now                  Disposition Plan  :   Status is: Inpatient  Remains inpatient appropriate because:Inpatient level of care appropriate due to severity of illness   Dispo: The patient is from: Home              Anticipated d/c is to: Home              Anticipated d/c date is: > 3 days              Patient currently is not medically stable to d/c.  Barriers to discharge: Hypoxia requiring O2 supplementation/complete 5 days of IV Remdesivir  Antimicorbials  :    Anti-infectives (From admission, onward)   Start     Dose/Rate Route Frequency Ordered Stop   09/14/20 2200  cefTRIAXone (ROCEPHIN) 2 g in sodium chloride 0.9 % 100 mL IVPB  Status:  Discontinued        2 g 200 mL/hr over 30 Minutes Intravenous Every 24 hours 09/14/20 2043 09/15/20 1102   09/14/20 2200  azithromycin (ZITHROMAX) 500 mg in sodium chloride 0.9 % 250 mL IVPB  Status:  Discontinued        500 mg 250 mL/hr over 60 Minutes Intravenous Every 24 hours 09/14/20 2043 09/15/20 1102   09/14/20 1000  remdesivir 100 mg in sodium chloride 0.9 % 100 mL IVPB       "Followed by" Linked Group Details   100 mg 200 mL/hr over 30 Minutes Intravenous Daily 09/21/2020 2306 09/17/20 0907   09/01/2020 2330  remdesivir 100 mg in sodium chloride 0.9 % 100 mL IVPB       "Followed by" Linked Group Details   100 mg 200  mL/hr over 30 Minutes Intravenous Every 30 min 09/26/2020 2306 09/14/20 0033      Inpatient Medications  Scheduled Meds: . acidophilus  1 capsule Oral Daily  . albuterol  2 puff Inhalation Q6H  . [START ON 09/21/2020] alprazolam  0.5 mg Oral BID  . vitamin C  500 mg Oral Daily  . atorvastatin  10 mg Oral Daily  .  baricitinib  4 mg Oral Q24H  . enoxaparin (LOVENOX) injection  40 mg Subcutaneous Q24H  . famotidine  20 mg Oral BID  . feeding supplement (ENSURE ENLIVE)  237 mL Oral BID BM  . influenza vaccine adjuvanted  0.5 mL Intramuscular Tomorrow-1000  . lamoTRIgine  100 mg Oral Daily  . levothyroxine  112.5 mcg Oral Once per day on Mon Fri  . levothyroxine  75 mcg Oral Once per day on Sun Tue Wed Thu Sat  . mouth rinse  15 mL Mouth Rinse BID  . methylPREDNISolone (SOLU-MEDROL) injection  70 mg Intravenous Q12H  . pantoprazole  40 mg Oral Q1200  . sertraline  100 mg Oral BID  . sodium chloride flush  3 mL Intravenous Q12H  . zinc sulfate  220 mg Oral Daily   Continuous Infusions: . sodium chloride Stopped (09/15/20 0004)  . sodium chloride Stopped (09/14/20 2233)   PRN Meds:.sodium chloride, sodium chloride, acetaminophen, chlorpheniramine-HYDROcodone, guaiFENesin-dextromethorphan, methocarbamol, ondansetron **OR** ondansetron (ZOFRAN) IV, oxyCODONE, phenol    See all Orders from today for further details   Phillips Climes M.D on 09/20/2020 at 12:23 PM  To page go to [email or web address] - use universal password  [facility] -  Office  [phone]    Objective:   Vitals:   09/20/20 0500 09/20/20 0555 09/20/20 0748 09/20/20 0800  BP:   105/71   Pulse:   75   Resp:   (!) 24 20  Temp:   98.2 F (36.8 C)   TempSrc:   Oral   SpO2: 91% 93% (!) 88% 92%  Weight:      Height:        Wt Readings from Last 3 Encounters:  09/12/2020 66.2 kg  09/04/20 69.2 kg  07/24/20 69.7 kg     Intake/Output Summary (Last 24 hours) at 09/20/2020 1223 Last data filed at 09/20/2020 1025 Gross per 24 hour  Intake 360 ml  Output 601 ml  Net -241 ml     Physical Exam  She Awake Alert, Oriented X 3, she has been dozing off more frequently today, but waking up and answering questions appropriately . Symmetrical Chest wall movement, Good air movement bilaterally, CTAB RRR,No Gallops,Rubs or new  Murmurs, No Parasternal Heave +ve B.Sounds, Abd Soft, No tenderness, No rebound - guarding or rigidity. No Cyanosis, Clubbing or edema, No new Rash or bruise         Data Review:    CBC Recent Labs  Lab 09/15/20 0324 09/16/20 0500 09/17/20 0123 09/18/20 0730 09/19/20 0616  WBC 5.5 8.2 6.7 9.5 11.7*  HGB 10.2* 10.6* 9.7* 11.4* 10.4*  HCT 30.2* 31.5* 28.7* 33.6* 30.8*  PLT 274 357 317 419* 355  MCV 89.9 91.6 90.8 89.1 89.3  MCH 30.4 30.8 30.7 30.2 30.1  MCHC 33.8 33.7 33.8 33.9 33.8  RDW 14.6 15.0 14.7 14.6 14.5  LYMPHSABS 1.1 1.5 0.7 1.6 0.8  MONOABS 0.4 0.7 0.2 0.8 0.5  EOSABS 0.0 0.0 0.0 0.0 0.0  BASOSABS 0.0 0.0 0.0 0.0 0.0    Chemistries  Recent Labs  Lab 09/15/20 0324 09/16/20 0500 09/17/20  0123 09/18/20 0730 09/19/20 0616  NA 130* 133* 129* 130* 128*  K 4.2 4.6 4.5 4.3 4.8  CL 99 101 97* 92* 93*  CO2 22 24 23 27 25   GLUCOSE 154* 119* 146* 121* 109*  BUN 12 13 12 16 23   CREATININE 6.06 0.89 0.91 0.87 0.91  CALCIUM 8.5* 8.7* 8.3* 9.0 8.6*  MG 1.9 1.8 1.7 2.3 2.0  AST 45* 39 39 43* 43*  ALT 24 23 23 29  33  ALKPHOS 95 102 98 110 91  BILITOT 0.4 0.5 0.7 0.6 0.7   ------------------------------------------------------------------------------------------------------------------ No results for input(s): CHOL, HDL, LDLCALC, TRIG, CHOLHDL, LDLDIRECT in the last 72 hours.  No results found for: HGBA1C ------------------------------------------------------------------------------------------------------------------ No results for input(s): TSH, T4TOTAL, T3FREE, THYROIDAB in the last 72 hours.  Invalid input(s): FREET3 ------------------------------------------------------------------------------------------------------------------ Recent Labs    09/18/20 0730 09/19/20 0616  FERRITIN 414* 288    Coagulation profile No results for input(s): INR, PROTIME in the last 168 hours.  Recent Labs    09/18/20 0730 09/19/20 0616  DDIMER 1.89* 2.32*     Cardiac Enzymes No results for input(s): CKMB, TROPONINI, MYOGLOBIN in the last 168 hours.  Invalid input(s): CK ------------------------------------------------------------------------------------------------------------------    Component Value Date/Time   BNP 229.9 (H) 09/14/2020 1305    Micro Results Recent Results (from the past 240 hour(s))  Blood Culture (routine x 2)     Status: None   Collection Time: 09/12/2020 10:00 PM   Specimen: BLOOD  Result Value Ref Range Status   Specimen Description   Final    BLOOD LEFT ANTECUBITAL Performed at [facility], [place] [address], [place], [place] [address]    Special Requests   Final    BOTTLES DRAWN AEROBIC AND ANAEROBIC Blood Culture adequate volume Performed at [facility], [address], [place], [place] [address]    Culture   Final    NO GROWTH 5 DAYS Performed at [facility], [place] [address], [place], [place] [address]    Report Status 09/19/2020 FINAL  Final  SARS Coronavirus 2 by RT PCR (hospital order, performed in [facility] hospital lab) Nasopharyngeal Nasopharyngeal Swab     Status: Abnormal   Collection Time: 08/30/2020 10:07 PM   Specimen: Nasopharyngeal Swab  Result Value Ref Range Status   SARS Coronavirus 2 POSITIVE (A) NEGATIVE Final    Comment: RESULT CALLED TO, READ BACK BY AND VERIFIED WITH: NEAL,K, RN @ 2202 09/04/2020 BY GWYN,P (NOTE) SARS-CoV-2 target nucleic acids are DETECTED  SARS-CoV-2 RNA is generally detectable in upper respiratory specimens  during the acute phase of infection.  Positive results are indicative  of the presence of the identified virus, but do not rule out bacterial infection or co-infection with other pathogens not detected by the test.  Clinical correlation with patient history and  other diagnostic information is necessary to determine patient infection status.  The expected result is negative.  Fact Sheet for Patients:    StrictlyIdeas.no   Fact Sheet for Healthcare Providers:   BankingDealers.co.za    This test is not yet approved or cleared by the [place] FDA and  has been authorized for detection and/or diagnosis of SARS-CoV-2 by FDA under an Emergency Use Authorization (EUA).  This EUA will remain in effect (meaning this t est can be used) for the duration of  the COVID-19 declaration under Section 564(b)(1) of the Act, 21 U.S.C. section 360-bbb-3(b)(1), unless the authorization is terminated or revoked sooner.  Performed at [facility], [address] [place], [place] [address]   MRSA PCR Screening     Status: None   Collection Time: 09/15/20 12:14 AM   Specimen: Nasal Mucosa; Nasopharyngeal  Result Value Ref Range Status   MRSA by PCR NEGATIVE NEGATIVE Final    Comment:        The GeneXpert MRSA Assay (FDA approved for NASAL specimens only), is one component of a comprehensive MRSA colonization surveillance program. It is not intended to diagnose MRSA infection nor to guide or monitor treatment for MRSA infections. Performed at [facility], [place] [address] [place], [place] [address]     Radiology Reports DG Chest Portable 1 View  Result Date: 09/10/2020 CLINICAL DATA:  Cough, congestion, fever, loss of taste and smell for 1 week EXAM: PORTABLE CHEST 1 VIEW COMPARISON:  01/11/2014 FINDINGS: Single frontal view of the chest demonstrates stable enlarged cardiac silhouette. There is diffuse increased interstitial prominence with bilateral ground-glass consolidation. No effusion or pneumothorax. No acute bony abnormalities. Bilateral shoulder arthroplasties. IMPRESSION: 1. Diffuse interstitial and ground-glass opacities, consistent with bilateral atypical pneumonia given clinical history. Electronically Signed   By: Randa Ngo M.D.   On: 09/20/2020 23:33

## 2020-09-20 NOTE — Plan of Care (Signed)
Patient is currently resting in bed. 2 assist pivot to BSC. VSS. O2 down to 6L HFNC. Denies pain. Bed alarm on. Call bell within reach.   Problem: Education: Goal: Knowledge of risk factors and measures for prevention of condition will improve Outcome: Progressing   Problem: Respiratory: Goal: Will maintain a patent airway Outcome: Progressing   Problem: Education: Goal: Knowledge of General Education information will improve Description: Including pain rating scale, medication(s)/side effects and non-pharmacologic comfort measures Outcome: Progressing   Problem: Health Behavior/Discharge Planning: Goal: Ability to manage health-related needs will improve Outcome: Progressing   Problem: Clinical Measurements: Goal: Respiratory complications will improve Outcome: Progressing   Problem: Activity: Goal: Risk for activity intolerance will decrease Outcome: Progressing   Problem: Nutrition: Goal: Adequate nutrition will be maintained Outcome: Progressing

## 2020-09-21 LAB — BLOOD GAS, ARTERIAL
Acid-Base Excess: 5.1 mmol/L — ABNORMAL HIGH (ref 0.0–2.0)
Bicarbonate: 28.9 mmol/L — ABNORMAL HIGH (ref 20.0–28.0)
Drawn by: 252031
FIO2: 72
O2 Saturation: 93.9 %
Patient temperature: 37
pCO2 arterial: 40.8 mmHg (ref 32.0–48.0)
pH, Arterial: 7.464 — ABNORMAL HIGH (ref 7.350–7.450)
pO2, Arterial: 68.2 mmHg — ABNORMAL LOW (ref 83.0–108.0)

## 2020-09-21 LAB — COMPREHENSIVE METABOLIC PANEL
ALT: 34 U/L (ref 0–44)
AST: 61 U/L — ABNORMAL HIGH (ref 15–41)
Albumin: 2.7 g/dL — ABNORMAL LOW (ref 3.5–5.0)
Alkaline Phosphatase: 101 U/L (ref 38–126)
Anion gap: 13 (ref 5–15)
BUN: 22 mg/dL (ref 8–23)
CO2: 23 mmol/L (ref 22–32)
Calcium: 8.5 mg/dL — ABNORMAL LOW (ref 8.9–10.3)
Chloride: 93 mmol/L — ABNORMAL LOW (ref 98–111)
Creatinine, Ser: 0.95 mg/dL (ref 0.44–1.00)
GFR calc Af Amer: >60 mL/min (ref >60)
GFR calc non Af Amer: 59 mL/min — ABNORMAL LOW (ref >60)
Glucose, Bld: 127 mg/dL — ABNORMAL HIGH (ref 70–99)
Potassium: 5.1 mmol/L (ref 3.5–5.1)
Sodium: 129 mmol/L — ABNORMAL LOW (ref 135–145)
Total Bilirubin: 1.2 mg/dL (ref 0.3–1.2)
Total Protein: 6.5 g/dL (ref 6.5–8.1)

## 2020-09-21 LAB — CBC
HCT: 33 % — ABNORMAL LOW (ref 36.0–46.0)
Hemoglobin: 11.1 g/dL — ABNORMAL LOW (ref 12.0–15.0)
MCH: 29.8 pg (ref 26.0–34.0)
MCHC: 33.6 g/dL (ref 30.0–36.0)
MCV: 88.5 fL (ref 80.0–100.0)
Platelets: 389 10*3/uL (ref 150–400)
RBC: 3.73 MIL/uL — ABNORMAL LOW (ref 3.87–5.11)
RDW: 14.3 % (ref 11.5–15.5)
WBC: 11.7 10*3/uL — ABNORMAL HIGH (ref 4.0–10.5)
nRBC: 0 % (ref 0.0–0.2)

## 2020-09-21 LAB — URINALYSIS, ROUTINE W REFLEX MICROSCOPIC
Bilirubin Urine: NEGATIVE
Glucose, UA: NEGATIVE mg/dL
Hgb urine dipstick: NEGATIVE
Ketones, ur: 5 mg/dL — AB
Leukocytes,Ua: NEGATIVE
Nitrite: NEGATIVE
Protein, ur: NEGATIVE mg/dL
Specific Gravity, Urine: 1.017 (ref 1.005–1.030)
pH: 6 (ref 5.0–8.0)

## 2020-09-21 LAB — C-REACTIVE PROTEIN: CRP: 4.2 mg/dL — ABNORMAL HIGH (ref <1.0)

## 2020-09-21 LAB — D-DIMER, QUANTITATIVE: D-Dimer, Quant: 3.03 ug/mL-FEU — ABNORMAL HIGH (ref 0.00–0.50)

## 2020-09-21 MED ORDER — BARICITINIB 2 MG PO TABS: 2.0000 mg | ORAL_TABLET | ORAL | Status: DC

## 2020-09-21 MED ORDER — SODIUM CHLORIDE 0.9 % IV SOLN: INTRAVENOUS | Status: AC

## 2020-09-21 MED ORDER — SODIUM CHLORIDE 0.9% FLUSH
10.0000 mL | Freq: Two times a day (BID) | INTRAVENOUS | Status: DC
  Administered 2020-09-21 – 2020-10-07 (×22): 10 mL

## 2020-09-21 MED ORDER — SODIUM CHLORIDE 0.9% FLUSH: 10.0000 mL | INTRAVENOUS | Status: DC | PRN

## 2020-09-21 NOTE — Progress Notes (Signed)
PROGRESS NOTE                                                                                                                                                                                                             Patient Demographics:    April Brewer, is a 74 y.o. female, DOB - 11-12-1946, QBV:[id number]  Outpatient Primary MD for the patient is McGowen, Adrian Blackwater, MD   Admit date - 09/05/2020   LOS - 7  Chief Complaint  Patient presents with  . Shortness of Breath       Brief Narrative: Patient is a 74 y.o. female with PMHx of HLD, hypothyroidism, GERD, lymphocytic colitis, anxiety/depression, chronic back pain on narcotics-presented with several days history of shortness of breath-found to have acute hypoxic respiratory failure secondary to COVID-19 pneumonia.  COVID-19 vaccinated status: Unvaccinated, she confirmed this today.  Significant Events: 9/18>> Admit to [facility] for severe hypoxia due to COVID-19 pneumonia  Significant studies: 9/17>>Chest x-ray: Diffuse interstitial and groundglass opacities  COVID-19 medications: Steroids:9/17>> Remdesivir: 9/17>> Baricitinib: 9/18>>  Antibiotics: Rocephin: 9/18 x 1 Zithromax: 9/18 x 1  Microbiology data: 9/17 >>blood culture: No growth  Procedures: None  Consults: None  DVT prophylaxis: enoxaparin (LOVENOX) injection 40 mg Start: 09/14/20 1000   Subjective:   Patient is more lethargic than baseline as discussed with staff, more confused, and she is urine incontinent.    Assessment  & Plan :   Acute Hypoxic Resp Failure due to Covid 19 Viral pneumonia:  - Continues to have severe hypoxemia-oxygen requirements continued to fluctuate between 10-15 L of HFNC.  This morning she is 10 L at rest, but had to go up to 15 L after she was moved from bed to chair. -Continue with IV Solu-Medrol. -treated With remdesivir. -Continue with baricitinib. -Lasix  as needed, will hold today given poor oral intake. -Continue  to trend inflammatory markers.   D-dimers trending up, will continue to monitor closely  Fever: afebrile O2 requirements:  SpO2: 95 % O2 Flow Rate (L/min): 10 L/min   COVID-19 Labs: Recent Labs    09/19/20 0616 09/20/20 1129 09/21/20 0145  DDIMER 2.32* 2.28* 3.03*  FERRITIN 288  --   --   CRP 5.6* 4.7* 4.2*       Component Value Date/Time   BNP 229.9 (H) 09/14/2020 1305  No results for input(s): PROCALCITON in the last 168 hours.  Lab Results  Component Value Date   SARSCOV2NAA POSITIVE (A) 09/26/2020   SARSCOV2NAA NOT DETECTED 06/06/2019     Prone/Incentive Spirometry: encouraged patient to lie prone for 3-4 hours at a time for a total of 16 hours a day, and to encourage incentive spirometry use 3-4/hour.  Transaminitis: Secondary to COVID-19-mild-continue to monitor.  Hyponatremia: - patient sodium was 129, hold Lasix, will start on IV normal saline .  Acute metabolic encephalopathy -Patient is more lethargic, confused and altered today, I will stop her Xanax, she did receive oxycodone yesterday which I will stop as well, she has no focal deficits on my exam, I will start her on IV fluids because of poor oral intake, will check procalcitonin and UA.  HLD: Continue statin  Hypothyroidism: Continue Synthroid-most recent TSH within normal limits.  Anxiety/depression:  - Appears stable-resume Xanax, Zoloft and lamotrigine. -Stopped her Xanax due to altered mental status.  Chronic back pain: -Stopped her oxycodone due to altered mental status.  Goals of care discussion: Continue full scope of treatment- short of intubation.  Given her tenuous situation-after extensive discussion with son/patient-we all agree that if patient were to deteriorate significantly-we will not escalate care to include intubation/mechanical ventilation-DNR order placed on 9/20  GI prophylaxis:H2 Blocker  ABG:    Component  Value Date/Time   PHART 7.464 (H) 09/20/2020 2359   PCO2ART 40.8 09/20/2020 2359   PO2ART 68.2 (L) 09/20/2020 2359   HCO3 28.9 (H) 09/20/2020 2359   TCO2 22 02/03/2015 0326   O2SAT 93.9 09/20/2020 2359    Vent Settings: N/A  Condition - Extremely Guarded  Family Communication  :  Son (Thomas [phone]) updated daily   Code Status :   DNR  Diet :  Diet Order            Diet regular Room service appropriate? Yes; Fluid consistency: Thin; Fluid restriction: 1500 mL Fluid  Diet effective now                  Disposition Plan  :   Status is: Inpatient  Remains inpatient appropriate because:Inpatient level of care appropriate due to severity of illness   Dispo: The patient is from: Home              Anticipated d/c is to: Home              Anticipated d/c date is: > 3 days              Patient currently is not medically stable to d/c.  Barriers to discharge: Hypoxia requiring O2 supplementation/complete 5 days of IV Remdesivir  Antimicorbials  :    Anti-infectives (From admission, onward)   Start     Dose/Rate Route Frequency Ordered Stop   09/14/20 2200  cefTRIAXone (ROCEPHIN) 2 g in sodium chloride 0.9 % 100 mL IVPB  Status:  Discontinued        2 g 200 mL/hr over 30 Minutes Intravenous Every 24 hours 09/14/20 2043 09/15/20 1102   09/14/20 2200  azithromycin (ZITHROMAX) 500 mg in sodium chloride 0.9 % 250 mL IVPB  Status:  Discontinued        500 mg 250 mL/hr over 60 Minutes Intravenous Every 24 hours 09/14/20 2043 09/15/20 1102   09/14/20 1000  remdesivir 100 mg in sodium chloride 0.9 % 100 mL IVPB       "Followed by" Linked Group Details  100 mg 200 mL/hr over 30 Minutes Intravenous Daily 09/22/2020 2306 09/17/20 0907   09/19/2020 2330  remdesivir 100 mg in sodium chloride 0.9 % 100 mL IVPB       "Followed by" Linked Group Details   100 mg 200 mL/hr over 30 Minutes Intravenous Every 30 min 09/08/2020 2306 09/14/20 0033      Inpatient Medications  Scheduled  Meds: . acidophilus  1 capsule Oral Daily  . albuterol  2 puff Inhalation Q6H  . vitamin C  500 mg Oral Daily  . atorvastatin  10 mg Oral Daily  . baricitinib  4 mg Oral Q24H  . enoxaparin (LOVENOX) injection  40 mg Subcutaneous Q24H  . famotidine  20 mg Oral BID  . feeding supplement (ENSURE ENLIVE)  237 mL Oral BID BM  . influenza vaccine adjuvanted  0.5 mL Intramuscular Tomorrow-1000  . lamoTRIgine  100 mg Oral Daily  . levothyroxine  112.5 mcg Oral Once per day on Mon Fri  . levothyroxine  75 mcg Oral Once per day on Sun Tue Wed Thu Sat  . mouth rinse  15 mL Mouth Rinse BID  . methylPREDNISolone (SOLU-MEDROL) injection  70 mg Intravenous Q12H  . pantoprazole  40 mg Oral Q1200  . sertraline  100 mg Oral BID  . sodium chloride flush  3 mL Intravenous Q12H  . zinc sulfate  220 mg Oral Daily   Continuous Infusions: . sodium chloride Stopped (09/15/20 0004)  . sodium chloride Stopped (09/14/20 2233)  . sodium chloride     PRN Meds:.sodium chloride, sodium chloride, acetaminophen, chlorpheniramine-HYDROcodone, guaiFENesin-dextromethorphan, methocarbamol, ondansetron **OR** ondansetron (ZOFRAN) IV, phenol    See all Orders from today for further details   Phillips Climes M.D on 09/21/2020 at 1:11 PM  To page go to [email or web address] - use universal password  [facility] -  Office  [phone]    Objective:   Vitals:   09/21/20 0400 09/21/20 0533 09/21/20 1103 09/21/20 1200  BP:   137/75 (!) 157/73  Pulse:   94 91  Resp:   (!) 21 19  Temp:   98.4 F (36.9 C) 98.2 F (36.8 C)  TempSrc:   Oral Oral  SpO2: 98% 95% 93% 95%  Weight:      Height:        Wt Readings from Last 3 Encounters:  09/05/2020 66.2 kg  09/04/20 69.2 kg  07/24/20 69.7 kg     Intake/Output Summary (Last 24 hours) at 09/21/2020 1311 Last data filed at 09/21/2020 0900 Gross per 24 hour  Intake 160 ml  Output --  Net 160 ml     Physical Exam  She is lethargic today, but open eyes to  verbal stimuli, extremely frail, she is alert x2. Symmetrical Chest wall movement, Good air movement bilaterally, CTAB RRR,No Gallops,Rubs or new Murmurs, No Parasternal Heave +ve B.Sounds, Abd Soft, No tenderness, No rebound - guarding or rigidity. No Cyanosis, Clubbing or edema, No new Rash or bruise        Data Review:    CBC Recent Labs  Lab 09/15/20 0324 09/15/20 0324 09/16/20 0500 09/16/20 0500 09/17/20 0123 09/18/20 0730 09/19/20 0616 09/20/20 1129 09/21/20 0304  WBC 5.5   < > 8.2   < > 6.7 9.5 11.7* 11.8* 11.7*  HGB 10.2*   < > 10.6*   < > 9.7* 11.4* 10.4* 11.3* 11.1*  HCT 30.2*   < > 31.5*   < > 28.7* 33.6* 30.8* 32.9* 33.0*  PLT 274   < >  357   < > 317 419* 355 404* 389  MCV 89.9   < > 91.6   < > 90.8 89.1 89.3 88.9 88.5  MCH 30.4   < > 30.8   < > 30.7 30.2 30.1 30.5 29.8  MCHC 33.8   < > 33.7   < > 33.8 33.9 33.8 34.3 33.6  RDW 14.6   < > 15.0   < > 14.7 14.6 14.5 14.5 14.3  LYMPHSABS 1.1  --  1.5  --  0.7 1.6 0.8  --   --   MONOABS 0.4  --  0.7  --  0.2 0.8 0.5  --   --   EOSABS 0.0  --  0.0  --  0.0 0.0 0.0  --   --   BASOSABS 0.0  --  0.0  --  0.0 0.0 0.0  --   --    < > = values in this interval not displayed.    Chemistries  Recent Labs  Lab 09/15/20 0324 09/15/20 0324 09/16/20 0500 09/16/20 0500 09/17/20 0123 09/18/20 0730 09/19/20 0616 09/20/20 1129 09/21/20 0145  NA 130*   < > 133*   < > 129* 130* 128* 129* 129*  K 4.2   < > 4.6   < > 4.5 4.3 4.8 4.3 5.1  CL 99   < > 101   < > 97* 92* 93* 90* 93*  CO2 22   < > 24   < > 23 27 25 28 23   GLUCOSE 154*   < > 119*   < > 146* 121* 109* 114* 127*  BUN 12   < > 13   < > 12 16 23 20 22   CREATININE 0.76   < > 0.89   < > 0.91 0.87 0.91 0.89 0.95  CALCIUM 8.5*   < > 8.7*   < > 8.3* 9.0 8.6* 8.8* 8.5*  MG 1.9  --  1.8  --  1.7 2.3 2.0  --   --   AST 45*   < > 39   < > 39 43* 43* 40 61*  ALT 24   < > 23   < > 23 29 33 35 34  ALKPHOS 95   < > 102   < > 98 110 91 91 101  BILITOT 0.4   < > 0.5   < > 0.7  0.6 0.7 0.6 1.2   < > = values in this interval not displayed.   ------------------------------------------------------------------------------------------------------------------ No results for input(s): CHOL, HDL, LDLCALC, TRIG, CHOLHDL, LDLDIRECT in the last 72 hours.  No results found for: HGBA1C ------------------------------------------------------------------------------------------------------------------ No results for input(s): TSH, T4TOTAL, T3FREE, THYROIDAB in the last 72 hours.  Invalid input(s): FREET3 ------------------------------------------------------------------------------------------------------------------ Recent Labs    09/19/20 0616  FERRITIN 288    Coagulation profile No results for input(s): INR, PROTIME in the last 168 hours.  Recent Labs    09/20/20 1129 09/21/20 0145  DDIMER 2.28* 3.03*    Cardiac Enzymes No results for input(s): CKMB, TROPONINI, MYOGLOBIN in the last 168 hours.  Invalid input(s): CK ------------------------------------------------------------------------------------------------------------------    Component Value Date/Time   BNP 229.9 (H) 09/14/2020 1305    Micro Results Recent Results (from the past 240 hour(s))  Blood Culture (routine x 2)     Status: None   Collection Time: 09/05/2020 10:00 PM   Specimen: BLOOD  Result Value Ref Range Status   Specimen Description   Final    BLOOD LEFT ANTECUBITAL Performed  at [facility], [place] [address], [place], [place] [address]    Special Requests   Final    BOTTLES DRAWN AEROBIC AND ANAEROBIC Blood Culture adequate volume Performed at [facility], [address], [place], [place] [address]    Culture   Final    NO GROWTH 5 DAYS Performed at [facility], [place] [address] [place], [place] [address]    Report Status 09/19/2020 FINAL  Final  SARS Coronavirus 2 by RT PCR (hospital order, performed in [facility] hospital lab) Nasopharyngeal  Nasopharyngeal Swab     Status: Abnormal   Collection Time: 09/08/2020 10:07 PM   Specimen: Nasopharyngeal Swab  Result Value Ref Range Status   SARS Coronavirus 2 POSITIVE (A) NEGATIVE Final    Comment: RESULT CALLED TO, READ BACK BY AND VERIFIED WITH: NEAL,K, RN @ 1027 09/09/2020 BY GWYN,P (NOTE) SARS-CoV-2 target nucleic acids are DETECTED  SARS-CoV-2 RNA is generally detectable in upper respiratory specimens  during the acute phase of infection.  Positive results are indicative  of the presence of the identified virus, but do not rule out bacterial infection or co-infection with other pathogens not detected by the test.  Clinical correlation with patient history and  other diagnostic information is necessary to determine patient infection status.  The expected result is negative.  Fact Sheet for Patients:   StrictlyIdeas.no   Fact Sheet for Healthcare Providers:   BankingDealers.co.za    This test is not yet approved or cleared by the [place] FDA and  has been authorized for detection and/or diagnosis of SARS-CoV-2 by FDA under an Emergency Use Authorization (EUA).  This EUA will remain in effect (meaning this t est can be used) for the duration of  the COVID-19 declaration under Section 564(b)(1) of the Act, 21 U.S.C. section 360-bbb-3(b)(1), unless the authorization is terminated or revoked sooner.  Performed at [facility], [place] [place], [place] [address]   MRSA PCR Screening     Status: None   Collection Time: 09/15/20 12:14 AM   Specimen: Nasal Mucosa; Nasopharyngeal  Result Value Ref Range Status   MRSA by PCR NEGATIVE NEGATIVE Final    Comment:        The GeneXpert MRSA Assay (FDA approved for NASAL specimens only), is one component of a comprehensive MRSA colonization surveillance program. It is not intended to diagnose MRSA infection nor to guide or monitor treatment for MRSA  infections. Performed at [facility], [place] [address] [place], [place] [address]     Radiology Reports DG Chest Portable 1 View  Result Date: 09/17/2020 CLINICAL DATA:  Cough, congestion, fever, loss of taste and smell for 1 week EXAM: PORTABLE CHEST 1 VIEW COMPARISON:  01/11/2014 FINDINGS: Single frontal view of the chest demonstrates stable enlarged cardiac silhouette. There is diffuse increased interstitial prominence with bilateral ground-glass consolidation. No effusion or pneumothorax. No acute bony abnormalities. Bilateral shoulder arthroplasties. IMPRESSION: 1. Diffuse interstitial and ground-glass opacities, consistent with bilateral atypical pneumonia given clinical history. Electronically Signed   By: Randa Ngo M.D.   On: 09/08/2020 23:33

## 2020-09-21 NOTE — NC FL2 (Signed)
[place] LEVEL OF CARE SCREENING TOOL     IDENTIFICATION  Patient Name: April Brewer Birthdate: March 30, 1946 Sex: female Admission Date (Current Location): 08/29/2020  [facility] and [place] Number:  Herbalist and Address:  [facility] [place], [place] [address], [place], [place] [address]      Provider Number: [id number]  Attending Physician Name and Address:  Elgergawy, Silver Huguenin, MD  Relative Name and Phone Number:  Faigy, Stretch [phone]) [phone] [facility])    Current Level of Care: Hospital Recommended Level of Care: [place] Prior Approval Number:    Date Approved/Denied:   PASRR Number: [id number]  Discharge Plan: SNF    Current Diagnoses: Patient Active Problem List   Diagnosis Date Noted  . Acute respiratory failure with hypoxia ([employer]) 09/14/2020  . Elevated AST (SGOT) 09/14/2020  . Hypocalcemia 09/14/2020  . Pneumonia due to COVID-19 virus 09/26/2020  . Degeneration of lumbar intervertebral disc 11/16/2019  . Long-term current use of opiate analgesic 11/16/2019  . Pain in right knee 07/03/2019  . H/O total shoulder replacement, left 06/09/2019  . Hip dislocation, right, initial encounter ([place]) 09/20/2018  . 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 Osteoarthritis of right hip 02/17/2018  . Chronic neck pain 01/03/2018  . Normocytic anemia 02/13/2016  . Lymphocytic colitis 03/14/2015  . Dyspnea 01/11/2014  . Venous insufficiency 08/23/2013  . Peripheral edema 08/23/2013  . Anxiety and depression 08/23/2013  . Hyponatremia 05/19/2013  . Cough, persistent 08/31/2012  . 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 OSTEOPENIA 02/18/2009  . Osteochondropathy 02/18/2009  . GOITER 06/02/2007  . Hypothyroidism 06/02/2007  . DEPRESSION 06/02/2007  . HYPERTENSION 06/02/2007  . ALLERGIC RHINITIS 06/02/2007  . GERD 06/02/2007  . INSOMNIA 06/02/2007    Orientation RESPIRATION BLADDER Height & Weight     Self, Time, Situation,  Place  O2 (HFNC 10L) External catheter Weight: 146 lb (66.2 kg) Height:  5\' 4"  (162.6 cm)  BEHAVIORAL SYMPTOMS/MOOD NEUROLOGICAL BOWEL NUTRITION STATUS   (none)  (none) Continent Diet (see d/c summary)  AMBULATORY STATUS COMMUNICATION OF NEEDS Skin   Extensive Assist Verbally Normal                       Personal Care Assistance Level of Assistance  Dressing, Feeding, Bathing Bathing Assistance: Maximum assistance Feeding assistance: Independent Dressing Assistance: Maximum assistance     Functional Limitations Info  Sight, Hearing, Speech Sight Info: Adequate Hearing Info: Adequate Speech Info: Adequate    SPECIAL CARE FACTORS FREQUENCY  PT (By licensed PT), OT (By licensed OT)     PT Frequency: 5x/week OT Frequency: 5x/week            Contractures Contractures Info: Not present    Additional Factors Info  Code Status, Allergies, Isolation Precautions Code Status Info: DNR Allergies Info: Latex     Isolation Precautions Info: COVID+     Current Medications (09/21/2020):  This is the current hospital active medication list Current Facility-Administered Medications  Medication Dose Route Frequency Provider Last Rate Last Admin  . 0.9 %  sodium chloride infusion   Intravenous PRN Norval Morton, MD   Stopped at 09/15/20 0004  . 0.9 %  sodium chloride infusion   Intravenous PRN Norval Morton, MD   Stopped at 09/14/20 2233  . acetaminophen (TYLENOL) tablet 650 mg  650 mg Oral Q6H PRN Fuller Plan A, MD   650 mg at 09/14/20 2005  . acidophilus (RISAQUAD)  capsule 1 capsule  1 capsule Oral Daily Fuller Plan A, MD   1 capsule at 09/20/20 0850  . albuterol (VENTOLIN HFA) 108 (90 Base) MCG/ACT inhaler 2 puff  2 puff Inhalation Q6H Fuller Plan A, MD   2 puff at 09/20/20 2024  . ascorbic acid (VITAMIN C) tablet 500 mg  500 mg Oral Daily Tamala Julian, Rondell A, MD   500 mg at 09/20/20 0851  . atorvastatin (LIPITOR) tablet 10 mg  10 mg Oral Daily Jonetta Osgood,  MD   10 mg at 09/20/20 0850  . baricitinib (OLUMIANT) tablet 4 mg  4 mg Oral Q24H Fuller Plan A, MD   4 mg at 09/20/20 2024  . chlorpheniramine-HYDROcodone (TUSSIONEX) 10-8 MG/5ML suspension 5 mL  5 mL Oral Q12H PRN Fuller Plan A, MD   5 mL at 09/16/20 0034  . enoxaparin (LOVENOX) injection 40 mg  40 mg Subcutaneous Q24H Fuller Plan A, MD   40 mg at 09/20/20 0902  . famotidine (PEPCID) tablet 20 mg  20 mg Oral BID Fuller Plan A, MD   20 mg at 09/20/20 2141  . feeding supplement (ENSURE ENLIVE) (ENSURE ENLIVE) liquid 237 mL  237 mL Oral BID BM Smith, Rondell A, MD   237 mL at 09/20/20 1222  . guaiFENesin-dextromethorphan (ROBITUSSIN DM) 100-10 MG/5ML syrup 10 mL  10 mL Oral Q4H PRN Fuller Plan A, MD   10 mL at 09/15/20 2122  . influenza vaccine adjuvanted (FLUAD) injection 0.5 mL  0.5 mL Intramuscular Tomorrow-1000 Elgergawy, Silver Huguenin, MD      . lamoTRIgine (LAMICTAL) tablet 100 mg  100 mg Oral Daily Jonetta Osgood, MD   100 mg at 09/20/20 0850  . levothyroxine (SYNTHROID) tablet 112.5 mcg  112.5 mcg Oral Once per day on Mon Fri Joselyn Glassman A, RPH   112.5 mcg at 09/20/20 0556  . levothyroxine (SYNTHROID) tablet 75 mcg  75 mcg Oral Once per day on Sun Tue Wed Thu Sat Jonetta Osgood, MD   75 mcg at 09/21/20 0531  . MEDLINE mouth rinse  15 mL Mouth Rinse BID Fuller Plan A, MD   15 mL at 09/20/20 2141  . methocarbamol (ROBAXIN) tablet 750 mg  750 mg Oral Q8H PRN Ghimire, Henreitta Leber, MD      . methylPREDNISolone sodium succinate (SOLU-MEDROL) 125 mg/2 mL injection 70 mg  70 mg Intravenous Q12H Jonetta Osgood, MD   70 mg at 09/20/20 2024  . ondansetron (ZOFRAN) tablet 4 mg  4 mg Oral Q6H PRN Fuller Plan A, MD   4 mg at 09/14/20 2011   Or  . ondansetron (ZOFRAN) injection 4 mg  4 mg Intravenous Q6H PRN Fuller Plan A, MD   4 mg at 09/17/20 1022  . pantoprazole (PROTONIX) EC tablet 40 mg  40 mg Oral Q1200 Jonetta Osgood, MD   40 mg at 09/20/20 1222  . phenol  (CHLORASEPTIC) mouth spray 1 spray  1 spray Mouth/Throat PRN Norval Morton, MD   1 spray at 09/15/20 0313  . sertraline (ZOLOFT) tablet 100 mg  100 mg Oral BID Jonetta Osgood, MD   100 mg at 09/20/20 2141  . sodium chloride flush (NS) 0.9 % injection 3 mL  3 mL Intravenous Q12H Smith, Rondell A, MD   3 mL at 09/20/20 2142  . zinc sulfate capsule 220 mg  220 mg Oral Daily Fuller Plan A, MD   220 mg at 09/20/20 0850     Discharge Medications:  Please see discharge summary for a list of discharge medications.  Relevant Imaging Results:  Relevant Lab Results:   Additional Information SSN [id number]  Benard Halsted, LCSW

## 2020-09-21 NOTE — Plan of Care (Signed)
Patient is currently resting in bed. Aox2. Responses to voice. Will follow commands and answer some questions. Increase in oxygen overnight d/t patient continuously trying to remove her oxygen. Incont of urine. Purewick placed. Provider made aware about increase in oxygen requirement and confusion which was different from the other night. ABG ordered. No acute changes. Currently on 13L HFNC. Will page provider about possibly ordering a Oncologist.   Problem: Education: Goal: Knowledge of risk factors and measures for prevention of condition will improve Outcome: Progressing   Problem: Respiratory: Goal: Will maintain a patent airway Outcome: Progressing   Problem: Education: Goal: Knowledge of General Education information will improve Description: Including pain rating scale, medication(s)/side effects and non-pharmacologic comfort measures Outcome: Progressing   Problem: Health Behavior/Discharge Planning: Goal: Ability to manage health-related needs will improve Outcome: Progressing   Problem: Clinical Measurements: Goal: Respiratory complications will improve Outcome: Progressing   Problem: Activity: Goal: Risk for activity intolerance will decrease Outcome: Progressing   Problem: Nutrition: Goal: Adequate nutrition will be maintained Outcome: Progressing

## 2020-09-21 NOTE — Progress Notes (Signed)
Resp paged to draw ABG.

## 2020-09-22 ENCOUNTER — Inpatient Hospital Stay (HOSPITAL_COMMUNITY): Payer: Medicare HMO

## 2020-09-22 DIAGNOSIS — I639 Cerebral infarction, unspecified: Secondary | ICD-10-CM

## 2020-09-22 DIAGNOSIS — I6330 Cerebral infarction due to thrombosis of unspecified cerebral artery: Secondary | ICD-10-CM | POA: Insufficient documentation

## 2020-09-22 LAB — COMPREHENSIVE METABOLIC PANEL
ALT: 38 U/L (ref 0–44)
AST: 44 U/L — ABNORMAL HIGH (ref 15–41)
Albumin: 2.8 g/dL — ABNORMAL LOW (ref 3.5–5.0)
Alkaline Phosphatase: 97 U/L (ref 38–126)
Anion gap: 11 (ref 5–15)
BUN: 22 mg/dL (ref 8–23)
CO2: 24 mmol/L (ref 22–32)
Calcium: 8.9 mg/dL (ref 8.9–10.3)
Chloride: 103 mmol/L (ref 98–111)
Creatinine, Ser: 1.02 mg/dL — ABNORMAL HIGH (ref 0.44–1.00)
GFR calc Af Amer: >60 mL/min (ref >60)
GFR calc non Af Amer: 54 mL/min — ABNORMAL LOW (ref >60)
Glucose, Bld: 110 mg/dL — ABNORMAL HIGH (ref 70–99)
Potassium: 4.2 mmol/L (ref 3.5–5.1)
Sodium: 138 mmol/L (ref 135–145)
Total Bilirubin: 1 mg/dL (ref 0.3–1.2)
Total Protein: 6.8 g/dL (ref 6.5–8.1)

## 2020-09-22 LAB — BRAIN NATRIURETIC PEPTIDE: B Natriuretic Peptide: 173.5 pg/mL — ABNORMAL HIGH (ref 0.0–100.0)

## 2020-09-22 LAB — CBC
HCT: 34 % — ABNORMAL LOW (ref 36.0–46.0)
Hemoglobin: 11 g/dL — ABNORMAL LOW (ref 12.0–15.0)
MCH: 29.8 pg (ref 26.0–34.0)
MCHC: 32.4 g/dL (ref 30.0–36.0)
MCV: 92.1 fL (ref 80.0–100.0)
Platelets: 434 10*3/uL — ABNORMAL HIGH (ref 150–400)
RBC: 3.69 MIL/uL — ABNORMAL LOW (ref 3.87–5.11)
RDW: 14.6 % (ref 11.5–15.5)
WBC: 14.8 10*3/uL — ABNORMAL HIGH (ref 4.0–10.5)
nRBC: 0 % (ref 0.0–0.2)

## 2020-09-22 LAB — LIPID PANEL
Cholesterol: 142 mg/dL (ref 0–200)
HDL: 49 mg/dL (ref >40)
LDL Cholesterol: 72 mg/dL (ref 0–99)
Total CHOL/HDL Ratio: 2.9 RATIO
Triglycerides: 104 mg/dL (ref <150)
VLDL: 21 mg/dL (ref 0–40)

## 2020-09-22 LAB — HEMOGLOBIN A1C
Hgb A1c MFr Bld: 6.3 % — ABNORMAL HIGH (ref 4.8–5.6)
Mean Plasma Glucose: 134.11 mg/dL

## 2020-09-22 LAB — PROCALCITONIN: Procalcitonin: <0.1 ng/mL

## 2020-09-22 LAB — C-REACTIVE PROTEIN: CRP: 5.3 mg/dL — ABNORMAL HIGH (ref <1.0)

## 2020-09-22 LAB — D-DIMER, QUANTITATIVE: D-Dimer, Quant: 2.15 ug/mL-FEU — ABNORMAL HIGH (ref 0.00–0.50)

## 2020-09-22 MED ORDER — ASPIRIN 300 MG RE SUPP
300.0000 mg | Freq: Every day | RECTAL | Status: DC
  Administered 2020-09-22 – 2020-09-27 (×6): 300 mg via RECTAL
  Filled 2020-09-22 (×7): qty 1

## 2020-09-22 MED ORDER — SODIUM CHLORIDE 0.9 % IV SOLN: INTRAVENOUS | Status: DC

## 2020-09-22 MED ORDER — LORAZEPAM 2 MG/ML IJ SOLN
1.0000 mg | Freq: Once | INTRAMUSCULAR | Status: AC
  Administered 2020-09-22: 1 mg via INTRAVENOUS
  Filled 2020-09-22: qty 1

## 2020-09-22 NOTE — Progress Notes (Signed)
PROGRESS NOTE                                                                                                                                                                                                             Patient Demographics:    April Brewer, is a 74 y.o. female, DOB - November 21, 1946, VZC:[id number]  Outpatient Primary MD for the patient is McGowen, Adrian Blackwater, MD   Admit date - 09/08/2020   LOS - 8  Chief Complaint  Patient presents with  . Shortness of Breath       Brief Narrative: Patient is a 74 y.o. female with PMHx of HLD, hypothyroidism, GERD, lymphocytic colitis, anxiety/depression, chronic back pain on narcotics-presented with several days history of shortness of breath-found to have acute hypoxic respiratory failure secondary to COVID-19 pneumonia.  Patient started to develop some lethargy 9/24, progressive, with no improvement despite holding Xanax and oxycodone, MRI brain significant for acute CVA 9/26 .  COVID-19 vaccinated status: Unvaccinated  Significant Events: 9/18>> Admit to [facility] for severe hypoxia due to COVID-19 pneumonia  Significant studies: 9/17>>Chest x-ray: Diffuse interstitial and groundglass opacities  COVID-19 medications: Steroids:9/17>> Remdesivir: 9/17>> Baricitinib: 9/18>>  Antibiotics: Rocephin: 9/18 x 1 Zithromax: 9/18 x 1  Microbiology data: 9/17 >>blood culture: No growth  Procedures: None  Consults: None  DVT prophylaxis: enoxaparin (LOVENOX) injection 40 mg Start: 09/14/20 1000   Subjective:   Patient is more lethargic and obtunded today, has been unable to take any oral medications this morning.  .    Assessment  & Plan :   Acute Hypoxic Resp Failure due to Covid 19 Viral pneumonia:  -Patient to severe COVID-19 pneumonia, severe hypoxia with significant oxygen requirement initially, this has been gradually improving, this morning she is on 3 L nasal  cannula . -Continue with IV Solu-Medrol. -treated With remdesivir. -Treated  with baricitinib. -Lasix as needed, holding given poor oral intake. -Continue  to trend inflammatory markers.   D-dimers trending up, will continue to monitor closely  Fever: afebrile O2 requirements:  SpO2: 91 % O2 Flow Rate (L/min): 3 L/min   COVID-19 Labs: Recent Labs    09/20/20 1129 09/21/20 0145 09/22/20 0418  DDIMER 2.28* 3.03* 2.15*  CRP 4.7* 4.2* 5.3*       Component Value Date/Time   BNP 173.5 (H) 09/22/2020 1287  Recent Labs  Lab 09/22/20 0418  PROCALCITON <0.10    Lab Results  Component Value Date   SARSCOV2NAA POSITIVE (A) 09/05/2020   SARSCOV2NAA NOT DETECTED 06/06/2019     Prone/Incentive Spirometry: encouraged patient to lie prone for 3-4 hours at a time for a total of 16 hours a day, and to encourage incentive spirometry use 3-4/hour.  Acute metabolic encephalopathy>> acute ischemic CVA -Patient is obtunded this morning, significantly more confused, no improvement of mental status despite stopping Xanax and oxycodone, no evidence of infectious process. -MRI was obtained this morning, showing a thickened posterior CVA, this is most likely in the setting of COVID-19 infection, and increased risk of thrombosis, will continue with Lovenox DVT prophylaxis dose, and I will start on aspirin suppository. -Neurology consulted, will await further recommendation. -Will keep n.p.o. as she is significantly lethargic and unsafe to swallow.   Transaminitis: Secondary to COVID-19-mild-continue to monitor.  Hyponatremia: - stable  HLD: Continue statin  Hypothyroidism: Continue Synthroid-most recent TSH within normal limits.  Anxiety/depression:  - Appears stable-resume Xanax, Zoloft and lamotrigine. -Stopped her Xanax due to altered mental status.  Chronic back pain: -Stopped her oxycodone due to altered mental status.  Goals of care discussion: Continue full scope of  treatment- short of intubation.  Given her tenuous situation-after extensive discussion with son/patient-we all agree that if patient were to deteriorate significantly-we will not escalate care to include intubation/mechanical ventilation-DNR order placed on 9/20  GI prophylaxis:H2 Blocker  ABG:    Component Value Date/Time   PHART 7.464 (H) 09/20/2020 2359   PCO2ART 40.8 09/20/2020 2359   PO2ART 68.2 (L) 09/20/2020 2359   HCO3 28.9 (H) 09/20/2020 2359   TCO2 22 02/03/2015 0326   O2SAT 93.9 09/20/2020 2359    Vent Settings: N/A  Consults: Neurology  Condition - Extremely Guarded  Family Communication  :  Son (Thomas [phone]) updated daily, he was updated about findings of acute CVA.   Code Status :   DNR  Diet :  Diet Order            Diet regular Room service appropriate? Yes; Fluid consistency: Thin; Fluid restriction: 1500 mL Fluid  Diet effective now                  Disposition Plan  :   Status is: Inpatient  Remains inpatient appropriate because:Inpatient level of care appropriate due to severity of illness   Dispo: The patient is from: Home              Anticipated d/c is to: Home              Anticipated d/c date is: > 3 days              Patient currently is not medically stable to d/c.  Barriers to discharge: Hypoxia requiring O2 supplementation/complete 5 days of IV Remdesivir  Antimicorbials  :    Anti-infectives (From admission, onward)   Start     Dose/Rate Route Frequency Ordered Stop   09/14/20 2200  cefTRIAXone (ROCEPHIN) 2 g in sodium chloride 0.9 % 100 mL IVPB  Status:  Discontinued        2 g 200 mL/hr over 30 Minutes Intravenous Every 24 hours 09/14/20 2043 09/15/20 1102   09/14/20 2200  azithromycin (ZITHROMAX) 500 mg in sodium chloride 0.9 % 250 mL IVPB  Status:  Discontinued        500 mg 250 mL/hr over 60 Minutes Intravenous Every  24 hours 09/14/20 2043 09/15/20 1102   09/14/20 1000  remdesivir 100 mg in sodium chloride 0.9 %  100 mL IVPB       "Followed by" Linked Group Details   100 mg 200 mL/hr over 30 Minutes Intravenous Daily 09/08/2020 2306 09/17/20 0907   09/25/2020 2330  remdesivir 100 mg in sodium chloride 0.9 % 100 mL IVPB       "Followed by" Linked Group Details   100 mg 200 mL/hr over 30 Minutes Intravenous Every 30 min 09/26/2020 2306 09/14/20 0033      Inpatient Medications  Scheduled Meds: . acidophilus  1 capsule Oral Daily  . albuterol  2 puff Inhalation Q6H  . vitamin C  500 mg Oral Daily  . aspirin  300 mg Rectal Daily  . atorvastatin  10 mg Oral Daily  . baricitinib  2 mg Oral Q24H  . enoxaparin (LOVENOX) injection  40 mg Subcutaneous Q24H  . famotidine  20 mg Oral BID  . feeding supplement (ENSURE ENLIVE)  237 mL Oral BID BM  . influenza vaccine adjuvanted  0.5 mL Intramuscular Tomorrow-1000  . lamoTRIgine  100 mg Oral Daily  . levothyroxine  112.5 mcg Oral Once per day on Mon Fri  . levothyroxine  75 mcg Oral Once per day on Sun Tue Wed Thu Sat  . mouth rinse  15 mL Mouth Rinse BID  . methylPREDNISolone (SOLU-MEDROL) injection  70 mg Intravenous Q12H  . pantoprazole  40 mg Oral Q1200  . sertraline  100 mg Oral BID  . sodium chloride flush  10-40 mL Intracatheter Q12H  . sodium chloride flush  3 mL Intravenous Q12H  . zinc sulfate  220 mg Oral Daily   Continuous Infusions: . sodium chloride Stopped (09/15/20 0004)  . sodium chloride Stopped (09/14/20 2233)   PRN Meds:.sodium chloride, sodium chloride, acetaminophen, chlorpheniramine-HYDROcodone, guaiFENesin-dextromethorphan, methocarbamol, ondansetron **OR** ondansetron (ZOFRAN) IV, phenol, sodium chloride flush    See all Orders from today for further details   Phillips Climes M.D on 09/22/2020 at 12:38 PM  To page go to [email or web address] - use universal password  [facility] -  Office  [phone]    Objective:   Vitals:   09/21/20 2351 09/22/20 0353 09/22/20 0721 09/22/20 1232  BP: (!) 142/75 (!) 142/87 136/85  125/79  Pulse: 76 87 84 93  Resp: 20 20 20 17   Temp: 98.5 F (36.9 C) 98.3 F (36.8 C) 97.6 F (36.4 C) 98.6 F (37 C)  TempSrc: Axillary Axillary Oral Oral  SpO2: 99% 97% 92% 91%  Weight:      Height:        Wt Readings from Last 3 Encounters:  09/22/2020 66.2 kg  09/22/20 66.2 kg  09/04/20 69.2 kg     Intake/Output Summary (Last 24 hours) at 09/22/2020 1238 Last data filed at 09/22/2020 0840 Gross per 24 hour  Intake 838.36 ml  Output 300 ml  Net 538.36 ml     Physical Exam  Patient is obtunded today, nonverbal Symmetrical Chest wall movement, Good air movement bilaterally, no wheezing RRR,No Gallops,Rubs or new Murmurs, No Parasternal Heave +ve B.Sounds, Abd Soft, No tenderness, No rebound - guarding or rigidity. No Cyanosis, Clubbing or edema, No new Rash or bruise         Data Review:    CBC Recent Labs  Lab 09/16/20 0500 09/16/20 0500 09/17/20 0123 09/17/20 0123 09/18/20 0730 09/19/20 0616 09/20/20 1129 09/21/20 0304 09/22/20 0418  WBC 8.2   < > 6.7   < >  9.5 11.7* 11.8* 11.7* 14.8*  HGB 10.6*   < > 9.7*   < > 11.4* 10.4* 11.3* 11.1* 11.0*  HCT 31.5*   < > 28.7*   < > 33.6* 30.8* 32.9* 33.0* 34.0*  PLT 357   < > 317   < > 419* 355 404* 389 434*  MCV 91.6   < > 90.8   < > 89.1 89.3 88.9 88.5 92.1  MCH 30.8   < > 30.7   < > 30.2 30.1 30.5 29.8 29.8  MCHC 33.7   < > 33.8   < > 33.9 33.8 34.3 33.6 32.4  RDW 15.0   < > 14.7   < > 14.6 14.5 14.5 14.3 14.6  LYMPHSABS 1.5  --  0.7  --  1.6 0.8  --   --   --   MONOABS 0.7  --  0.2  --  0.8 0.5  --   --   --   EOSABS 0.0  --  0.0  --  0.0 0.0  --   --   --   BASOSABS 0.0  --  0.0  --  0.0 0.0  --   --   --    < > = values in this interval not displayed.    Chemistries  Recent Labs  Lab 09/16/20 0500 09/16/20 0500 09/17/20 0123 09/17/20 0123 09/18/20 0730 09/19/20 0616 09/20/20 1129 09/21/20 0145 09/22/20 0418  NA 133*   < > 129*   < > 130* 128* 129* 129* 138  K 4.6   < > 4.5   < > 4.3 4.8  4.3 5.1 4.2  CL 101   < > 97*   < > 92* 93* 90* 93* 103  CO2 24   < > 23   < > 27 25 28 23 24   GLUCOSE 119*   < > 146*   < > 121* 109* 114* 127* 110*  BUN 13   < > 12   < > 16 23 20 22 22   CREATININE 0.89   < > 0.91   < > 0.87 0.91 0.89 0.95 1.02*  CALCIUM 8.7*   < > 8.3*   < > 9.0 8.6* 8.8* 8.5* 8.9  MG 1.8  --  1.7  --  2.3 2.0  --   --   --   AST 39   < > 39   < > 43* 43* 40 61* 44*  ALT 23   < > 23   < > 29 33 35 34 38  ALKPHOS 102   < > 98   < > 110 91 91 101 97  BILITOT 0.5   < > 0.7   < > 0.6 0.7 0.6 1.2 1.0   < > = values in this interval not displayed.   ------------------------------------------------------------------------------------------------------------------ No results for input(s): CHOL, HDL, LDLCALC, TRIG, CHOLHDL, LDLDIRECT in the last 72 hours.  No results found for: HGBA1C ------------------------------------------------------------------------------------------------------------------ No results for input(s): TSH, T4TOTAL, T3FREE, THYROIDAB in the last 72 hours.  Invalid input(s): FREET3 ------------------------------------------------------------------------------------------------------------------ No results for input(s): VITAMINB12, FOLATE, FERRITIN, TIBC, IRON, RETICCTPCT in the last 72 hours.  Coagulation profile No results for input(s): INR, PROTIME in the last 168 hours.  Recent Labs    09/21/20 0145 09/22/20 0418  DDIMER 3.03* 2.15*    Cardiac Enzymes No results for input(s): CKMB, TROPONINI, MYOGLOBIN in the last 168 hours.  Invalid input(s): CK ------------------------------------------------------------------------------------------------------------------    Component Value Date/Time   BNP  173.5 (H) 09/22/2020 1751    Micro Results Recent Results (from the past 240 hour(s))  Blood Culture (routine x 2)     Status: None   Collection Time: 08/29/2020 10:00 PM   Specimen: BLOOD  Result Value Ref Range Status   Specimen Description    Final    BLOOD LEFT ANTECUBITAL Performed at [facility], [place] [address], [place], [place] [address]    Special Requests   Final    BOTTLES DRAWN AEROBIC AND ANAEROBIC Blood Culture adequate volume Performed at [facility], [address] [place], [place] [address]    Culture   Final    NO GROWTH 5 DAYS Performed at [facility], [place] [address] [place], [place] [address]    Report Status 09/19/2020 FINAL  Final  SARS Coronavirus 2 by RT PCR (hospital order, performed in [facility] hospital lab) Nasopharyngeal Nasopharyngeal Swab     Status: Abnormal   Collection Time: 09/10/2020 10:07 PM   Specimen: Nasopharyngeal Swab  Result Value Ref Range Status   SARS Coronavirus 2 POSITIVE (A) NEGATIVE Final    Comment: RESULT CALLED TO, READ BACK BY AND VERIFIED WITH: NEAL,K, RN @ 6144 09/19/2020 BY GWYN,P (NOTE) SARS-CoV-2 target nucleic acids are DETECTED  SARS-CoV-2 RNA is generally detectable in upper respiratory specimens  during the acute phase of infection.  Positive results are indicative  of the presence of the identified virus, but do not rule out bacterial infection or co-infection with other pathogens not detected by the test.  Clinical correlation with patient history and  other diagnostic information is necessary to determine patient infection status.  The expected result is negative.  Fact Sheet for Patients:   StrictlyIdeas.no   Fact Sheet for Healthcare Providers:   BankingDealers.co.za    This test is not yet approved or cleared by the [place] FDA and  has been authorized for detection and/or diagnosis of SARS-CoV-2 by FDA under an Emergency Use Authorization (EUA).  This EUA will remain in effect (meaning this t est can be used) for the duration of  the COVID-19 declaration under Section 564(b)(1) of the Act, 21 U.S.C. section 360-bbb-3(b)(1), unless the authorization is terminated or revoked  sooner.  Performed at [facility], [address] [place], [place] [address]   MRSA PCR Screening     Status: None   Collection Time: 09/15/20 12:14 AM   Specimen: Nasal Mucosa; Nasopharyngeal  Result Value Ref Range Status   MRSA by PCR NEGATIVE NEGATIVE Final    Comment:        The GeneXpert MRSA Assay (FDA approved for NASAL specimens only), is one component of a comprehensive MRSA colonization surveillance program. It is not intended to diagnose MRSA infection nor to guide or monitor treatment for MRSA infections. Performed at [facility], [place] [address] [place], [place] [address]     Radiology Reports MR BRAIN WO CONTRAST  Result Date: 09/22/2020 CLINICAL DATA:  Delirium.  COVID positive EXAM: MRI HEAD WITHOUT CONTRAST TECHNIQUE: Multiplanar, multiecho pulse sequences of the brain and surrounding structures were obtained without intravenous contrast. COMPARISON:  None. FINDINGS: Brain: Extensive acute infarction in the posterior circulation, patchily present throughout the left more than right cerebellum with right cerebellar involvement mainly at the superior cerebellar distribution and left-sided involvement both at the PICA and superior cerebellar arteries. Right more than left PCA involvement involving the bilateral thalamus and right more than left occipital cortex. Right more  than left hippocampal region involvement. No anterior circulation infarct is seen. No hemorrhage, hydrocephalus, or masslike finding. Mild chronic small vessel disease. Cerebral volume loss. Vascular: A right vertebral and basilar flow void or visible. The left vertebral artery could be hypoplastic or diseased. Skull and upper cervical spine: Normal marrow signal. Cervical spine degeneration with C3-4 and C4-5 anterolisthesis Sinuses/Orbits: Bilateral cataract resection. Nasal septal perforation. Other: Progressively motion degraded study ASAP these results will be called to the  ordering clinician or representative by the Radiologist Assistant, and communication documented in the PACS or [employer]. 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 Electronically Signed   By: Monte Fantasia M.D.   On: 09/22/2020 11:45   DG Chest Portable 1 View  Result Date: 09/24/2020 CLINICAL DATA:  Cough, congestion, fever, loss of taste and smell for 1 week EXAM: PORTABLE CHEST 1 VIEW COMPARISON:  01/11/2014 FINDINGS: Single frontal view of the chest demonstrates stable enlarged cardiac silhouette. There is diffuse increased interstitial prominence with bilateral ground-glass consolidation. No effusion or pneumothorax. No acute bony abnormalities. Bilateral shoulder arthroplasties. IMPRESSION: 1. Diffuse interstitial and ground-glass opacities, consistent with bilateral atypical pneumonia given clinical history. Electronically Signed   By: Randa Ngo M.D.   On: 09/02/2020 23:33

## 2020-09-22 NOTE — Consult Note (Signed)
Referring Physician: Dr Waldron Labs    Reason for Consult:  Stroke  HPI: April Brewer is an 74 y.o. female with a history of chronic kidney disease stage III, hyperlipidemia, hypothyroidism, and depression who presented to [facility] ED on 09/14/20 with dyspnea and hypoxia. She was found to have Covid pneumonia and was admitted for further evaluation and treatment. Today the patient was noted to be more lethargic and confused. 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 A CTA of the Head and Neck is currently pending. She has no prior history of stroke and had not been on anti platelet therapy. She has now been started on aspirin suppositories.  She has remained quite sleepy and hard to arouse since admission and has been agitated requiring restraints.  She seemed a little more interactive during my exam compared to that documented by medical hospitalist  Date last known well: Unable to determine Time last known well: Unable to determine  tPA Given: no - unknown time of onset.  Past Medical History Past Medical History:  Diagnosis Date  . ALLERGIC RHINITIS 06/02/2007  . Allergy   . Arthritis    DDD of cervical, thoracic, and lumbar spine  . Blood transfusion without reported diagnosis   . Cataract    Bilateral removed cateracts  . Cervical spondylosis    C5-6 and C6-7.  Pain mgmt as per Dr. Nelva Bush.  . Chronic gastritis without bleeding   . Chronic low back pain 02/18/2009   s/p fusion L3-4 through L5-S1.  Pain meds per Dr. Nelva Bush  . Chronic renal insufficiency, stage 3 (moderate)    borderline II/III (GFR about 55 ml/min) as of 2021  . DEPRESSION 06/02/2007  . Diverticulosis   . GERD (gastroesophageal reflux disease)   . Heart murmur   . Hiatal hernia   . Hypercholesterolemia   . HYPOTHYROIDISM 06/02/2007   GOITER  . IBS (irritable bowel syndrome)   . INSOMNIA 06/02/2007  . Lymphocytic colitis   . Muscle spasms of lower extremity   . Nasal septal  perforation    chronic; hx of epistaxis  . Normocytic anemia 04/2015   HEME + in ED 08/07/15--endoscopic eval unrevealing.  Iron studies fine-->?Anemia of chronic dz (lymphocytic colitis?).  Hb 02/2020 11.5.  . OSTEOPENIA 02/18/2009   Repeat DEXA 07/2014 showed osteoporosis by T score in radius but spine and hip T scores were normal-continue vit D and calcium and repeat DEXA 08/2016 unchanged.  Repeat DEXA 2 yrs.  . Peripheral edema    LE's, nonpitting  . Renal cyst, acquired, right    1.2 cm, 2021  . Simple hepatic cyst    x 1. 2021    Surgical History Past Surgical History:  Procedure Laterality Date  . ABDOMINAL HYSTERECTOMY     for DUB (ovaries are still in)  . BACK SURGERY     X 5: fusion of L3-L4 through L5-S1  . CATARACT EXTRACTION Bilateral   . CHOLECYSTECTOMY  2012  . COLONOSCOPY  06/2007; 01/2015   2008 Diverticulosis, o/w normal.  2016 showed lymphocytic colitis with mild diverticulosis: pt was started on oral budesonide at that time.  . CT SCAN  09/03/2017   ABDOMEN, HEAD  . DEXA  08/16/2014; 09/14/16   Hip and spine ok; radius osteoporotic---but meds not indicated.  Repeat 08/2018  . ESOPHAGOGASTRODUODENOSCOPY  10/2007; 11/27/15; 04/26/20   03/2020: chronic gastritis, H pylori neg, celiac neg.  +Barretts.  Hyperplastic gastric polyp.  Marland Kitchen HIP CLOSED REDUCTION Right  09/20/2018   Procedure: CLOSED REDUCTION HIP;  Surgeon: Paralee Cancel, MD;  Location: [facility];  Service: Orthopedics;  Laterality: Right;  . REVERSE SHOULDER ARTHROPLASTY Left 06/09/2019   Procedure: REVERSE SHOULDER ARTHROPLASTY;  Surgeon: Netta Cedars, MD;  Location: [facility];  Service: Orthopedics;  Laterality: Left;  . SHOULDER SURGERY Right    \  . TOTAL HIP ARTHROPLASTY  05/31/2012   Procedure: TOTAL HIP ARTHROPLASTY ANTERIOR APPROACH;  Surgeon: Mauri Pole, MD;  Location: [facility];  Service: Orthopedics;  Laterality: Left;  . TOTAL HIP ARTHROPLASTY Right 08/30/2018   Procedure: RIGHT TOTAL HIP ARTHROPLASTY ANTERIOR  APPROACH;  Surgeon: Paralee Cancel, MD;  [facility]: [facility];  Service: Orthopedics;  Laterality: Right;  70 mins    Family History  Family History  Problem Relation Age of Onset  . Arthritis Mother   . Stroke Mother   . Breast cancer Mother   . Diabetes Mother   . Colon cancer Father   . Heart disease Father   . Heart disease Sister   . Breast cancer Sister   . Lung cancer Brother        smoked  . Uterine cancer Sister   . Esophageal cancer Neg Hx   . Rectal cancer Neg Hx   . Stomach cancer Neg Hx     Social History:   reports that she has never smoked. She has never used smokeless tobacco. She reports that she does not drink alcohol and does not use drugs.  Allergies:  Allergies  Allergen Reactions  . Latex Rash    Home Medications:  Medications Prior to Admission  Medication Sig Dispense Refill  . alprazolam (XANAX) 2 MG tablet TAKE 1 TABLET BY MOUTH TWICE A DAY (Patient taking differently: Take 2 mg by mouth 2 (two) times daily. TAKE 1 TABLET BY MOUTH TWICE A DAY) 60 tablet 5  . ammonium lactate (LAC-HYDRIN) 12 % lotion Apply 1 application topically as needed for dry skin.     Marland Kitchen atorvastatin (LIPITOR) 10 MG tablet Take 10 mg by mouth daily.   1  . famotidine (PEPCID) 20 MG tablet Take 20 mg by mouth 2 (two) times daily as needed for heartburn or indigestion.    . furosemide (LASIX) 20 MG tablet Take 20 mg by mouth as needed for edema.    . hydrOXYzine (ATARAX/VISTARIL) 10 MG tablet Take 10 mg by mouth in the morning, at noon, in the evening, and at bedtime.    . lamoTRIgine (LAMICTAL) 100 MG tablet Take 100 mg by mouth daily.    Marland Kitchen levothyroxine (SYNTHROID) 75 MCG tablet Take 1.5 tablets by mouth on Mondays and Fridays and 1 tab all other days. (Patient taking differently: Take 75-112.5 mcg by mouth See admin instructions. Take 1.5 tablets( 112.38mg)  by mouth on Mondays and Fridays and 1 tab (754m) all other days.) 114 tablet 3  . methocarbamol (ROBAXIN) 750 MG tablet Take  750 mg by mouth every 8 (eight) hours as needed for muscle spasms.    . naloxone (NARCAN) 4 MG/0.1ML LIQD nasal spray kit Place 1 spray into the nose once. Repeat every 3 minutes until patient awakes or call EMS    . Oxycodone HCl 10 MG TABS Take 10 mg by mouth 2 (two) times daily as needed (pain).     . Marland Kitchenertraline (ZOLOFT) 100 MG tablet Take 1 tablet (100 mg total) by mouth 2 (two) times daily. 180 tablet 3  . furosemide (LASIX) 20 MG tablet Take 1 tablet (20  mg total) by mouth daily as needed for fluid or edema. 30 tablet 0  . polyethylene glycol (MIRALAX / GLYCOLAX) packet Take 17 g by mouth 2 (two) times daily. (Patient not taking: Reported on 09/04/2020) 14 each 0    Hospital Medications . acidophilus  1 capsule Oral Daily  . albuterol  2 puff Inhalation Q6H  . vitamin C  500 mg Oral Daily  . aspirin  300 mg Rectal Daily  . atorvastatin  10 mg Oral Daily  . enoxaparin (LOVENOX) injection  40 mg Subcutaneous Q24H  . famotidine  20 mg Oral BID  . feeding supplement (ENSURE ENLIVE)  237 mL Oral BID BM  . influenza vaccine adjuvanted  0.5 mL Intramuscular Tomorrow-1000  . lamoTRIgine  100 mg Oral Daily  . levothyroxine  112.5 mcg Oral Once per day on Mon Fri  . levothyroxine  75 mcg Oral Once per day on Sun Tue Wed Thu Sat  . mouth rinse  15 mL Mouth Rinse BID  . methylPREDNISolone (SOLU-MEDROL) injection  70 mg Intravenous Q12H  . pantoprazole  40 mg Oral Q1200  . sertraline  100 mg Oral BID  . sodium chloride flush  10-40 mL Intracatheter Q12H  . sodium chloride flush  3 mL Intravenous Q12H  . zinc sulfate  220 mg Oral Daily    ROS: As documented above in history of presenting illness.  All other systems negative  history obtained from chart review and the patient      Physical Examination:  Vitals:   09/22/20 0353 09/22/20 0721 09/22/20 1232 09/22/20 1530  BP: (!) 142/87 136/85 125/79 (!) 141/88  Pulse: 87 84 93 86  Resp: 20 20 17 19   Temp: 98.3 F (36.8 C) 97.6 F (36.4  C) 98.6 F (37 C) 98.2 F (36.8 C)  TempSrc: Axillary Oral Oral Oral  SpO2: 97% 92% 91% 94%  Weight:      Height:       Frail cachectic malnourished looking elderly Caucasian lady not in distress. . Afebrile. Head is nontraumatic. Neck is supple without bruit.    Cardiac exam no murmur or gallop. Lungs are clear to auscultation. Distal pulses are well felt. Neurological Exam :  Patient is drowsy and is lying with eyes closed.  She does open eyes partially to sternal rub.  She follows simple midline commands and answers to her name.  She speaks short sentences.  Eyes in primary position.  No spontaneous side-to-side eye movements.  She does not follow gaze consistently.  There is no nystagmus.  Pupils are equal reactive.  Fundi not visualized.  She does not blink to threat consistently on either side.  No facial weakness.  Tongue midline.  Motor system exam she is able to move all 4 extremities well against gravity without focal weakness though not cooperative for detailed muscle testing.  Tone is normal.  Deep tendon reflexes are symmetric.  Plantars downgoing.  Gait not tested.  LABORATORY STUDIES:  Basic Metabolic Panel: Recent Labs  Lab 09/16/20 0500 09/16/20 0500 09/17/20 0123 09/17/20 0123 09/18/20 0730 09/18/20 0730 09/19/20 0616 09/19/20 0616 09/20/20 1129 09/21/20 0145 09/22/20 0418  NA 133*   < > 129*   < > 130*  --  128*  --  129* 129* 138  K 4.6   < > 4.5   < > 4.3  --  4.8  --  4.3 5.1 4.2  CL 101   < > 97*   < > 92*  --  93*  --  90* 93* 103  CO2 24   < > 23   < > 27  --  25  --  28 23 24   GLUCOSE 119*   < > 146*   < > 121*  --  109*  --  114* 127* 110*  BUN 13   < > 12   < > 16  --  23  --  20 22 22   CREATININE 0.89   < > 0.91   < > 0.87  --  0.91  --  0.89 0.95 1.02*  CALCIUM 8.7*   < > 8.3*   < > 9.0   < > 8.6*   < > 8.8* 8.5* 8.9  MG 1.8  --  1.7  --  2.3  --  2.0  --   --   --   --    < > = values in this interval not displayed.    Liver Function  Tests: Recent Labs  Lab 09/18/20 0730 09/19/20 0616 09/20/20 1129 09/21/20 0145 09/22/20 0418  AST 43* 43* 40 61* 44*  ALT 29 33 35 34 38  ALKPHOS 110 91 91 101 97  BILITOT 0.6 0.7 0.6 1.2 1.0  PROT 6.9 6.1* 6.4* 6.5 6.8  ALBUMIN 2.9* 2.4* 2.7* 2.7* 2.8*   No results for input(s): LIPASE, AMYLASE in the last 168 hours. No results for input(s): AMMONIA in the last 168 hours.  CBC: Recent Labs  Lab 09/16/20 0500 09/16/20 0500 09/17/20 0123 09/17/20 0123 09/18/20 0730 09/19/20 0616 09/20/20 1129 09/21/20 0304 09/22/20 0418  WBC 8.2   < > 6.7   < > 9.5 11.7* 11.8* 11.7* 14.8*  NEUTROABS 6.0  --  5.8  --  7.0 10.3*  --   --   --   HGB 10.6*   < > 9.7*   < > 11.4* 10.4* 11.3* 11.1* 11.0*  HCT 31.5*   < > 28.7*   < > 33.6* 30.8* 32.9* 33.0* 34.0*  MCV 91.6   < > 90.8   < > 89.1 89.3 88.9 88.5 92.1  PLT 357   < > 317   < > 419* 355 404* 389 434*   < > = values in this interval not displayed.    Cardiac Enzymes: No results for input(s): CKTOTAL, CKMB, CKMBINDEX, TROPONINI in the last 168 hours.  BNP: Invalid input(s): POCBNP  CBG: No results for input(s): GLUCAP in the last 168 hours.  Microbiology:   Coagulation Studies: No results for input(s): LABPROT, INR in the last 72 hours.  Urinalysis:  Recent Labs  Lab 09/21/20 1453  COLORURINE YELLOW  LABSPEC 1.017  PHURINE 6.0  GLUCOSEU NEGATIVE  HGBUR NEGATIVE  BILIRUBINUR NEGATIVE  KETONESUR 5*  PROTEINUR NEGATIVE  NITRITE NEGATIVE  LEUKOCYTESUR NEGATIVE    Lipid Panel:     Component Value Date/Time   CHOL 166 02/28/2020 0000   TRIG 73 08/29/2020 2208   HDL 81 (A) 02/28/2020 0000   CHOLHDL 4 05/23/2015 1400   VLDL 20.4 05/23/2015 1400   LDLCALC 72 02/28/2020 0000    HgbA1C:  No results found for: HGBA1C  Urine Drug Screen:  No results found for: LABOPIA, COCAINSCRNUR, LABBENZ, AMPHETMU, THCU, LABBARB   Alcohol Level:  No results for input(s): ETH in the last 168 hours.   IMAGING:  MR  BRAIN WO CONTRAST  Result Date: 09/22/2020 CLINICAL DATA:  Delirium.  COVID positive EXAM: MRI HEAD WITHOUT CONTRAST TECHNIQUE: Multiplanar, multiecho pulse sequences of  the brain and surrounding structures were obtained without intravenous contrast. COMPARISON:  None. FINDINGS: Brain: Extensive acute infarction in the posterior circulation, patchily present throughout the left more than right cerebellum with right cerebellar involvement mainly at the superior cerebellar distribution and left-sided involvement both at the PICA and superior cerebellar arteries. Right more than left PCA involvement involving the bilateral thalamus and right more than left occipital cortex. Right more than left hippocampal region involvement. No anterior circulation infarct is seen. No hemorrhage, hydrocephalus, or masslike finding. Mild chronic small vessel disease. Cerebral volume loss. Vascular: A right vertebral and basilar flow void or visible. The left vertebral artery could be hypoplastic or diseased. Skull and upper cervical spine: Normal marrow signal. Cervical spine degeneration with C3-4 and C4-5 anterolisthesis Sinuses/Orbits: Bilateral cataract resection. Nasal septal perforation. Other: Progressively motion degraded study ASAP these results will be called to the ordering clinician or representative by the Radiologist Assistant, and communication documented in the PACS or [employer]. 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 Electronically Signed   By: Monte Fantasia M.D.   On: 09/22/2020 11:45    Transthoracic Echocardiogram - pending  ECG - 09/02/2020 - SR rate 79 BPM. (See cardiology reading for complete details)  Assessment:  Ms. April Brewer is a 74 y.o. female with history of  chronic kidney disease stage III, hyperlipidemia, hypothyroidism, and depression who presented to [facility] ED on 09/14/20 with dyspnea and hypoxia. She was found to have Covid pneumonia and was  admitted for further evaluation and treatment. Today the patient was noted to be more lethargic and confused. 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 She did not receive IV t-PA due to unknown time of onset.  Stroke Risk Factors  Advanced age  Positive for Covid 9  Family hx stroke (mother)  Other Active Problems  Active COVID-19 infection and pneumonia   Plan:  HgbA1c, fasting lipid panel - pending  CT Angiogram Head and Neck - pending  Echocardiogram - pending  Stroke prophylactic therapy - aspirin suppositories  Allow for permissive hypertension for the first 24-48h - only treat PRN if SBP >220 mmHg. Blood pressures can be gradually normalized to SBP<140 upon discharge  Statin Therapy - LDL goal < 70  Risk factor modification  Telemetry monitoring for paroxysmal A. fib  Frequent neuro checks  Fall Precautions  DVT prophelaxis - pt is on Lovenox  NPO until swallowing evaluation has been passed (aspirin suppository if needed)  I have personally obtained history,examined this patient, reviewed notes, independently viewed imaging studies, participated in medical decision making and plan of care.ROS completed by me personally and pertinent positives fully documented  I have made any additions or clarifications directly to the above note. Agree with note above. . Patient's mental status appears to be depressed and out of proportion to the size of her multiple posterior circulation embolic infarcts and she may have a component of Covid encephalopathy superimposed as well.  Continue aspirin for now and check CT angiogram of brain and neck, echocardiogram and telemetry monitoring as well as lipid profile hemoglobin A1c.  May need outpatient loop recorder for paroxysmal A. fib if no obvious source of embolism is found during hospitalization.  Physical occupational speech therapy consults.  Mobilize out of bed.  Long discussion with patient and with  Dr. Emeline Gins and answered questions. Greater than 50% time during this 80-minute consultation visit was spent in counseling and coordination of care about his embolic stroke  and Covid and discussion with care team Antony Contras, MD Medical Director [place] Pager: [phone] 09/22/2020 4:33 PM

## 2020-09-23 ENCOUNTER — Inpatient Hospital Stay (HOSPITAL_COMMUNITY): Payer: Medicare HMO

## 2020-09-23 DIAGNOSIS — I6389 Other cerebral infarction: Secondary | ICD-10-CM

## 2020-09-23 DIAGNOSIS — J1282 Pneumonia due to Coronavirus disease 2019: Secondary | ICD-10-CM | POA: Diagnosis not present

## 2020-09-23 DIAGNOSIS — U071 COVID-19: Secondary | ICD-10-CM | POA: Diagnosis not present

## 2020-09-23 DIAGNOSIS — I361 Nonrheumatic tricuspid (valve) insufficiency: Secondary | ICD-10-CM

## 2020-09-23 DIAGNOSIS — J9601 Acute respiratory failure with hypoxia: Secondary | ICD-10-CM | POA: Diagnosis not present

## 2020-09-23 DIAGNOSIS — I6330 Cerebral infarction due to thrombosis of unspecified cerebral artery: Secondary | ICD-10-CM | POA: Diagnosis not present

## 2020-09-23 DIAGNOSIS — I340 Nonrheumatic mitral (valve) insufficiency: Secondary | ICD-10-CM

## 2020-09-23 LAB — CBC
HCT: 33 % — ABNORMAL LOW (ref 36.0–46.0)
Hemoglobin: 10.6 g/dL — ABNORMAL LOW (ref 12.0–15.0)
MCH: 29.6 pg (ref 26.0–34.0)
MCHC: 32.1 g/dL (ref 30.0–36.0)
MCV: 92.2 fL (ref 80.0–100.0)
Platelets: 381 10*3/uL (ref 150–400)
RBC: 3.58 MIL/uL — ABNORMAL LOW (ref 3.87–5.11)
RDW: 14.9 % (ref 11.5–15.5)
WBC: 10.3 10*3/uL (ref 4.0–10.5)
nRBC: 0 % (ref 0.0–0.2)

## 2020-09-23 LAB — COMPREHENSIVE METABOLIC PANEL
ALT: 32 U/L (ref 0–44)
AST: 42 U/L — ABNORMAL HIGH (ref 15–41)
Albumin: 2.6 g/dL — ABNORMAL LOW (ref 3.5–5.0)
Alkaline Phosphatase: 87 U/L (ref 38–126)
Anion gap: 9 (ref 5–15)
BUN: 23 mg/dL (ref 8–23)
CO2: 24 mmol/L (ref 22–32)
Calcium: 9 mg/dL (ref 8.9–10.3)
Chloride: 108 mmol/L (ref 98–111)
Creatinine, Ser: 0.96 mg/dL (ref 0.44–1.00)
GFR calc Af Amer: >60 mL/min (ref >60)
GFR calc non Af Amer: 58 mL/min — ABNORMAL LOW (ref >60)
Glucose, Bld: 129 mg/dL — ABNORMAL HIGH (ref 70–99)
Potassium: 4.3 mmol/L (ref 3.5–5.1)
Sodium: 141 mmol/L (ref 135–145)
Total Bilirubin: 0.8 mg/dL (ref 0.3–1.2)
Total Protein: 6.4 g/dL — ABNORMAL LOW (ref 6.5–8.1)

## 2020-09-23 LAB — ECHOCARDIOGRAM COMPLETE
Area-P 1/2: 2.53 cm2
Height: 64 in
S' Lateral: 2.4 cm
Weight: 2335.11 oz

## 2020-09-23 LAB — PROCALCITONIN: Procalcitonin: <0.1 ng/mL

## 2020-09-23 LAB — D-DIMER, QUANTITATIVE: D-Dimer, Quant: 2.26 ug/mL-FEU — ABNORMAL HIGH (ref 0.00–0.50)

## 2020-09-23 LAB — C-REACTIVE PROTEIN: CRP: 9.4 mg/dL — ABNORMAL HIGH (ref <1.0)

## 2020-09-23 MED ORDER — LEVOTHYROXINE SODIUM 100 MCG/5ML IV SOLN
40.0000 ug | Freq: Every day | INTRAVENOUS | Status: DC
  Administered 2020-09-26 – 2020-09-28 (×2): 40 ug via INTRAVENOUS
  Filled 2020-09-23 (×5): qty 5

## 2020-09-23 MED ORDER — LEVOTHYROXINE SODIUM 100 MCG/5ML IV SOLN: 25.0000 ug | Freq: Every day | INTRAVENOUS | Status: DC

## 2020-09-23 MED ORDER — METHYLPREDNISOLONE SODIUM SUCC 40 MG IJ SOLR
40.0000 mg | Freq: Two times a day (BID) | INTRAMUSCULAR | Status: DC
  Administered 2020-09-23 – 2020-09-26 (×6): 40 mg via INTRAVENOUS
  Filled 2020-09-23 (×6): qty 1

## 2020-09-23 MED ORDER — PANTOPRAZOLE SODIUM 40 MG IV SOLR
40.0000 mg | INTRAVENOUS | Status: DC
  Administered 2020-09-24 – 2020-09-28 (×5): 40 mg via INTRAVENOUS
  Filled 2020-09-23 (×6): qty 40

## 2020-09-23 NOTE — Evaluation (Signed)
Clinical/Bedside Swallow Evaluation Patient Details  Name: April Brewer MRN: [id number] Date of Birth: June 20, 1946  Today's Date: 09/23/2020 Time: SLP Start Time (ACUTE ONLY): 0840 SLP Stop Time (ACUTE ONLY): 0920 SLP Time Calculation (min) (ACUTE ONLY): 40 min  Past Medical History:  Past Medical History:  Diagnosis Date  . ALLERGIC RHINITIS 06/02/2007  . Allergy   . Arthritis    DDD of cervical, thoracic, and lumbar spine  . Blood transfusion without reported diagnosis   . Cataract    Bilateral removed cateracts  . Cervical spondylosis    C5-6 and C6-7.  Pain mgmt as per Dr. Nelva Bush.  . Chronic gastritis without bleeding   . Chronic low back pain 02/18/2009   s/p fusion L3-4 through L5-S1.  Pain meds per Dr. Nelva Bush  . Chronic renal insufficiency, stage 3 (moderate)    borderline II/III (GFR about 55 ml/min) as of 2021  . DEPRESSION 06/02/2007  . Diverticulosis   . GERD (gastroesophageal reflux disease)   . Heart murmur   . Hiatal hernia   . Hypercholesterolemia   . HYPOTHYROIDISM 06/02/2007   GOITER  . IBS (irritable bowel syndrome)   . INSOMNIA 06/02/2007  . Lymphocytic colitis   . Muscle spasms of lower extremity   . Nasal septal perforation    chronic; hx of epistaxis  . Normocytic anemia 04/2015   HEME + in ED 08/07/15--endoscopic eval unrevealing.  Iron studies fine-->?Anemia of chronic dz (lymphocytic colitis?).  Hb 02/2020 11.5.  . OSTEOPENIA 02/18/2009   Repeat DEXA 07/2014 showed osteoporosis by T score in radius but spine and hip T scores were normal-continue vit D and calcium and repeat DEXA 08/2016 unchanged.  Repeat DEXA 2 yrs.  . Peripheral edema    LE's, nonpitting  . Renal cyst, acquired, right    1.2 cm, 2021  . Simple hepatic cyst    x 1. 2021   Past Surgical History:  Past Surgical History:  Procedure Laterality Date  . ABDOMINAL HYSTERECTOMY     for DUB (ovaries are still in)  . BACK SURGERY     X 5: fusion of L3-L4 through L5-S1  . CATARACT EXTRACTION  Bilateral   . CHOLECYSTECTOMY  2012  . COLONOSCOPY  06/2007; 01/2015   2008 Diverticulosis, o/w normal.  2016 showed lymphocytic colitis with mild diverticulosis: pt was started on oral budesonide at that time.  . CT SCAN  09/03/2017   ABDOMEN, HEAD  . DEXA  08/16/2014; 09/14/16   Hip and spine ok; radius osteoporotic---but meds not indicated.  Repeat 08/2018  . ESOPHAGOGASTRODUODENOSCOPY  10/2007; 11/27/15; 04/26/20   03/2020: chronic gastritis, H pylori neg, celiac neg.  +Barretts.  Hyperplastic gastric polyp.  Marland Kitchen HIP CLOSED REDUCTION Right 09/20/2018   Procedure: CLOSED REDUCTION HIP;  Surgeon: Paralee Cancel, MD;  [facility]: [facility];  Service: Orthopedics;  Laterality: Right;  . REVERSE SHOULDER ARTHROPLASTY Left 06/09/2019   Procedure: REVERSE SHOULDER ARTHROPLASTY;  Surgeon: Netta Cedars, MD;  Location: [facility];  Service: Orthopedics;  Laterality: Left;  . SHOULDER SURGERY Right    _0  . TOTAL HIP ARTHROPLASTY  05/31/2012   Procedure: TOTAL HIP ARTHROPLASTY ANTERIOR APPROACH;  Surgeon: Mauri Pole, MD;  Location: [facility];  Service: Orthopedics;  Laterality: Left;  . TOTAL HIP ARTHROPLASTY Right 08/30/2018   Procedure: RIGHT TOTAL HIP ARTHROPLASTY ANTERIOR APPROACH;  Surgeon: Paralee Cancel, MD;  Location: [facility];  Service: Orthopedics;  Laterality: Right;  70 mins   HPI:  Patient is a 74 y.o. female  with PMHx of HLD, hypothyroidism, GERD, lymphocytic colitis, anxiety/depression, chronic back pain on narcotics-presented with several days history of shortness of breath-found to have acute hypoxic respiratory failure secondary to COVID-19 pneumonia.  Patient started to develop some lethargy 9/24, progressive, with no improvement despite holding Xanax and oxycodone, MRI brain significant for acute CVA 9/26 .desat to 83 on oxygen. 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  Swallow eval ordered.   Assessment / Plan / Recommendation Clinical Impression  Pt currently presenting with severe xerostomia,  SLP provided oral care with use of oral suction, toothettes and toothbrush kit.  Pt assisted to brush her teeth hand over hand.  RR noted to increase to 44 with oxygen saturation down to 83.  Minimal intake provided including ice chips, tsp water - which pt did not swallow - allow it to pool in oral cavity.  Pt swished with water and SLP provided oral suction.  SLP then provideed pt with 1/2 tsp of nectar thick juice mixed with applesauce with cue for pt to swallow.  Attempts to swallow immediately followed by coughing and desaturation to low 80's.  Max verbal/visual cues to slow RR and breath in throught the mouth and out via oral cavity did not assist to slow rate.  Pt was oriented to self and location and being"sick" later recalled having COVID. At this time, largest barrier for po intake is her RR and appears with motor planning issues.  Pt educated to findings/recommendations using teach back.  Recommend oral care - at least QID. SLP Visit Diagnosis: Dysphagia, oropharyngeal phase (R13.12)    Aspiration Risk  Moderate aspiration risk;Severe aspiration risk;Risk for inadequate nutrition/hydration    Diet Recommendation NPO;Ice chips PRN after oral care   Medication Administration: Via alternative means Postural Changes: Seated upright at 90 degrees;Remain upright for at least 30 minutes after po intake    Other  Recommendations Oral Care Recommendations: Oral care QID Other Recommendations: Have oral suction available   Follow up Recommendations Skilled Nursing facility      Frequency and Duration min 2x/week  2 weeks       Prognosis Prognosis for Safe Diet Advancement: [place] Date of Onset: 09/23/20 HPI: Patient is a 74 y.o. female with PMHx of HLD, hypothyroidism, GERD, lymphocytic colitis, anxiety/depression, chronic back pain on narcotics-presented with several days history of shortness of breath-found to have acute hypoxic respiratory failure secondary  to COVID-19 pneumonia.  Patient started to develop some lethargy 9/24, progressive, with no improvement despite holding Xanax and oxycodone, MRI brain significant for acute CVA 9/26 .desat to 83 on oxygen. 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  Swallow eval ordered. Type of Study: Bedside Swallow Evaluation Diet Prior to this Study: NPO Temperature Spikes Noted: No Respiratory Status: Nasal cannula History of Recent Intubation: No Behavior/Cognition: Alert;Cooperative Oral Cavity Assessment: Dry;Dried secretions Oral Care Completed by SLP: Yes (extensive oral care) Oral Cavity - Dentition: Adequate natural dentition Vision: Functional for self-feeding Self-Feeding Abilities: Total assist Patient Positioning: Upright in bed Baseline Vocal Quality: Hoarse Volitional Cough: Weak Volitional Swallow: Unable to elicit    Oral/Motor/Sensory Function Overall Oral Motor/Sensory Function: Moderate impairment (motor planning difficulties, pt with increased facial fullness on right vs left, RR up to 44 at rest after oral care, oxygen desaturation to 83 with effort, even after recovered RR remained 44)   Ice Chips Ice chips: Impaired Presentation: Spoon Oral Phase Functional Implications: Oral holding;Prolonged oral transit  Other Comments: pt did not elicit swallow   Thin Liquid Thin Liquid: Impaired Presentation: Spoon Other Comments: pt did not elicit swallow, SLP orally suctioned boluses after pt swishes    Nectar Thick Nectar Thick Liquid: Not tested   Honey Thick Honey Thick Liquid: Impaired Presentation: Spoon Oral Phase Impairments: Reduced labial seal;Reduced lingual movement/coordination Oral Phase Functional Implications: Prolonged oral transit Pharyngeal Phase Impairments: Suspected delayed Swallow;Cough - Immediate   Puree Puree: Not tested   Solid     Solid: Not tested      Macario Golds 09/23/2020,9:29 AM  Kathleen Lime, MS [place] Office  [phone]

## 2020-09-23 NOTE — Progress Notes (Signed)
PROGRESS NOTE                                                                                                                                                                                                             Patient Demographics:    April Brewer, is a 74 y.o. female, DOB - [birth year], LHT:[id number]  Outpatient Primary MD for the patient is McGowen, Adrian Blackwater, MD   Admit date - 09/21/2020   LOS - 9  Chief Complaint  Patient presents with  . Shortness of Breath       Brief Narrative: Patient is a 74 y.o. female with PMHx of HLD, hypothyroidism, GERD, lymphocytic colitis, anxiety/depression, chronic back pain on narcotics-presented with several days history of shortness of breath-found to have acute hypoxic respiratory failure secondary to COVID-19 pneumonia.  Patient started to develop some lethargy 9/26, progressive, with no improvement despite holding Xanax and oxycodone, MRI brain significant for acute CVA 9/26 .  COVID-19 vaccinated status: Unvaccinated  Significant Events: 9/18>> Admit to [facility] for severe hypoxia due to COVID-19 pneumonia 9/26>> MRI brain significant for acute CVA  Significant studies: 9/17>>Chest x-ray: Diffuse interstitial and groundglass opacities  COVID-19 medications: Steroids:9/17>> Remdesivir: 9/17>> Baricitinib: 9/18>>  Antibiotics: Rocephin: 9/18 x 1 Zithromax: 9/18 x 1  Microbiology data: 9/17 >>blood culture: No growth  Procedures: None  Consults: Neurology  DVT prophylaxis: enoxaparin (LOVENOX) injection 40 mg Start: 09/14/20 1000   Subjective:   Patient is more awake and interactive today, she failed swallow evaluation this morning .   Assessment  & Plan :   Acute Hypoxic Resp Failure due to Covid 19 Viral pneumonia:  -Patient with  severe COVID-19 pneumonia, severe hypoxia with significant oxygen requirement initially, oxygen requirement peaked at 10 to  15 L high flow nasal cannula, this has been gradually improving, this morning she is on 5 L -Continue with IV Solu-Medrol. -treated With remdesivir. -Treated  with baricitinib(hold currently given n.p.o. status) -Continue  to trend inflammatory markers.     Fever: afebrile O2 requirements:  SpO2: 95 % O2 Flow Rate (L/min): 5 L/min   COVID-19 Labs: Recent Labs    09/21/20 0145 09/22/20 0418 09/23/20 0500  DDIMER 3.03* 2.15* 2.26*  CRP 4.2* 5.3* 9.4*       Component Value Date/Time   BNP 173.5 (H) 09/22/2020 5726  Recent Labs  Lab 09/22/20 0418 09/23/20 0500  PROCALCITON <0.10 <0.10    Lab Results  Component Value Date   SARSCOV2NAA POSITIVE (A) 09/05/2020   SARSCOV2NAA NOT DETECTED 06/06/2019     Prone/Incentive Spirometry: encouraged patient to lie prone for 3-4 hours at a time for a total of 16 hours a day, and to encourage incentive spirometry use 3-4/hour.   acute ischemic CVA -Patient is obtunded 9/26, despite holding Xanax and oxycodone, her MRI brain significant for PICA territory -Agement.  Neurology, CTA head and neck pending, 2D echo pending, she is on telemetry monitoring, continue with aspirin suppository for now. -PT/OT/SLP following, she remains n.p.o. for now, keep on IV fluids.  Transaminitis:  - Secondary to COVID-19-mild-continue to monitor.  Hyponatremia: - resolved with NS  HLD:  -Continue statin  Hypothyroidism:  - Continue Synthroid, will change to IV as she is n.p.o.  Anxiety/depression:  -on  Xanax, Zoloft and lamotrigine. -Stopped her Xanax due to altered mental status.  Chronic back pain: -Stopped her oxycodone due to altered mental status.  Goals of care discussion: Continue full scope of treatment- short of intubation.  Given her tenuous situation-after extensive discussion with son/patient-we all agree that if patient were to deteriorate significantly-we will not escalate care to include intubation/mechanical  ventilation-DNR order placed on 9/20  GI prophylaxis:PPI  ABG:    Component Value Date/Time   PHART 7.464 (H) 09/20/2020 2359   PCO2ART 40.8 09/20/2020 2359   PO2ART 68.2 (L) 09/20/2020 2359   HCO3 28.9 (H) 09/20/2020 2359   TCO2 22 02/03/2015 0326   O2SAT 93.9 09/20/2020 2359    Vent Settings: N/A  Consults: Neurology  Condition - Extremely Guarded  Family Communication  :  Son [name] [phone]) updated on a daily basis  Code Status :   DNR  Diet :  Diet Order    None       Disposition Plan  :   Status is: Inpatient  Remains inpatient appropriate because:Inpatient level of care appropriate due to severity of illness   Dispo: The patient is from: Home              Anticipated d/c is to: Home              Anticipated d/c date is: > 3 days              Patient currently is not medically stable to d/c.  Barriers to discharge: Hypoxia requiring O2 supplementation/complete 5 days of IV Remdesivir  Antimicorbials  :    Anti-infectives (From admission, onward)   Start     Dose/Rate Route Frequency Ordered Stop   09/14/20 2200  cefTRIAXone (ROCEPHIN) 2 g in sodium chloride 0.9 % 100 mL IVPB  Status:  Discontinued        2 g 200 mL/hr over 30 Minutes Intravenous Every 24 hours 09/14/20 2043 09/15/20 1102   09/14/20 2200  azithromycin (ZITHROMAX) 500 mg in sodium chloride 0.9 % 250 mL IVPB  Status:  Discontinued        500 mg 250 mL/hr over 60 Minutes Intravenous Every 24 hours 09/14/20 2043 09/15/20 1102   09/14/20 1000  remdesivir 100 mg in sodium chloride 0.9 % 100 mL IVPB       "Followed by" Linked Group Details   100 mg 200 mL/hr over 30 Minutes Intravenous Daily 09/17/2020 2306 09/17/20 0907   09/08/2020 2330  remdesivir 100 mg in sodium chloride 0.9 % 100 mL IVPB       "  Followed by" Linked Group Details   100 mg 200 mL/hr over 30 Minutes Intravenous Every 30 min 09/12/2020 2306 09/14/20 0033      Inpatient Medications  Scheduled Meds: . acidophilus  1  capsule Oral Daily  . albuterol  2 puff Inhalation Q6H  . vitamin C  500 mg Oral Daily  . aspirin  300 mg Rectal Daily  . atorvastatin  10 mg Oral Daily  . enoxaparin (LOVENOX) injection  40 mg Subcutaneous Q24H  . famotidine  20 mg Oral BID  . feeding supplement (ENSURE ENLIVE)  237 mL Oral BID BM  . influenza vaccine adjuvanted  0.5 mL Intramuscular Tomorrow-1000  . lamoTRIgine  100 mg Oral Daily  . levothyroxine  112.5 mcg Oral Once per day on Mon Fri  . levothyroxine  75 mcg Oral Once per day on Sun Tue Wed Thu Sat  . mouth rinse  15 mL Mouth Rinse BID  . methylPREDNISolone (SOLU-MEDROL) injection  70 mg Intravenous Q12H  . pantoprazole  40 mg Oral Q1200  . sertraline  100 mg Oral BID  . sodium chloride flush  10-40 mL Intracatheter Q12H  . sodium chloride flush  3 mL Intravenous Q12H  . zinc sulfate  220 mg Oral Daily   Continuous Infusions: . sodium chloride Stopped (09/15/20 0004)  . sodium chloride Stopped (09/14/20 2233)  . sodium chloride 75 mL/hr at 09/23/20 0714   PRN Meds:.sodium chloride, sodium chloride, acetaminophen, chlorpheniramine-HYDROcodone, guaiFENesin-dextromethorphan, methocarbamol, ondansetron **OR** ondansetron (ZOFRAN) IV, phenol, sodium chloride flush    See all Orders from today for further details   Phillips Climes M.D on 09/23/2020 at 11:50 AM  To page go to [email or web address] - use universal password  [facility] -  Office  [phone]    Objective:   Vitals:   09/22/20 2355 09/23/20 0358 09/23/20 0720 09/23/20 0746  BP: (!) 148/89   139/82  Pulse: 72   75  Resp: 20 20 16 17   Temp: 97.7 F (36.5 C) 98.5 F (36.9 C)  98.4 F (36.9 C)  TempSrc: Axillary Axillary  Axillary  SpO2: 94%   95%  Weight:      Height:        Wt Readings from Last 3 Encounters:  09/10/2020 66.2 kg  09/22/20 66.2 kg  09/04/20 69.2 kg     Intake/Output Summary (Last 24 hours) at 09/23/2020 1150 Last data filed at 09/23/2020 0545 Gross per 24 hour    Intake 770.73 ml  Output 401 ml  Net 369.73 ml     Physical Exam  Patient is awake today, more appropriate, follows simple commands, answering yes and no questions, more interactive, with severe aphasia . Symmetrical Chest wall movement, Good air movement bilaterally, CTAB RRR,No Gallops,Rubs or new Murmurs, No Parasternal Heave +ve B.Sounds, Abd Soft, No tenderness, No rebound - guarding or rigidity. No Cyanosis, Clubbing or edema, No new Rash or bruise         Data Review:    CBC Recent Labs  Lab 09/17/20 0123 09/17/20 0123 09/18/20 0730 09/18/20 0730 09/19/20 0616 09/20/20 1129 09/21/20 0304 09/22/20 0418 09/23/20 0500  WBC 6.7   < > 9.5   < > 11.7* 11.8* 11.7* 14.8* 10.3  HGB 9.7*   < > 11.4*   < > 10.4* 11.3* 11.1* 11.0* 10.6*  HCT 28.7*   < > 33.6*   < > 30.8* 32.9* 33.0* 34.0* 33.0*  PLT 317   < > 419*   < > 355 404* 389  434* 381  MCV 90.8   < > 89.1   < > 89.3 88.9 88.5 92.1 92.2  MCH 30.7   < > 30.2   < > 30.1 30.5 29.8 29.8 29.6  MCHC 33.8   < > 33.9   < > 33.8 34.3 33.6 32.4 32.1  RDW 14.7   < > 14.6   < > 14.5 14.5 14.3 14.6 14.9  LYMPHSABS 0.7  --  1.6  --  0.8  --   --   --   --   MONOABS 0.2  --  0.8  --  0.5  --   --   --   --   EOSABS 0.0  --  0.0  --  0.0  --   --   --   --   BASOSABS 0.0  --  0.0  --  0.0  --   --   --   --    < > = values in this interval not displayed.    Chemistries  Recent Labs  Lab 09/17/20 0123 09/17/20 0123 09/18/20 0730 09/18/20 0730 09/19/20 0616 09/20/20 1129 09/21/20 0145 09/22/20 0418 09/23/20 0500  NA 129*   < > 130*   < > 128* 129* 129* 138 141  K 4.5   < > 4.3   < > 4.8 4.3 5.1 4.2 4.3  CL 97*   < > 92*   < > 93* 90* 93* 103 108  CO2 23   < > 27   < > 25 28 23 24 24   GLUCOSE 146*   < > 121*   < > 109* 114* 127* 110* 129*  BUN 12   < > 16   < > 23 20 22 22 23   CREATININE 0.91   < > 0.87   < > 0.91 0.89 0.95 1.02* 0.96  CALCIUM 8.3*   < > 9.0   < > 8.6* 8.8* 8.5* 8.9 9.0  MG 1.7  --  2.3  --  2.0   --   --   --   --   AST 39   < > 43*   < > 43* 40 61* 44* 42*  ALT 23   < > 29   < > 33 35 34 38 32  ALKPHOS 98   < > 110   < > 91 91 101 97 87  BILITOT 0.7   < > 0.6   < > 0.7 0.6 1.2 1.0 0.8   < > = values in this interval not displayed.   ------------------------------------------------------------------------------------------------------------------ Recent Labs    09/22/20 0418  CHOL 142  HDL 49  LDLCALC 72  TRIG 104  CHOLHDL 2.9    Lab Results  Component Value Date   HGBA1C 6.3 (H) 09/22/2020   ------------------------------------------------------------------------------------------------------------------ No results for input(s): TSH, T4TOTAL, T3FREE, THYROIDAB in the last 72 hours.  Invalid input(s): FREET3 ------------------------------------------------------------------------------------------------------------------ No results for input(s): VITAMINB12, FOLATE, FERRITIN, TIBC, IRON, RETICCTPCT in the last 72 hours.  Coagulation profile No results for input(s): INR, PROTIME in the last 168 hours.  Recent Labs    09/22/20 0418 09/23/20 0500  DDIMER 2.15* 2.26*    Cardiac Enzymes No results for input(s): CKMB, TROPONINI, MYOGLOBIN in the last 168 hours.  Invalid input(s): CK ------------------------------------------------------------------------------------------------------------------    Component Value Date/Time   BNP 173.5 (H) 09/22/2020 7893    Micro Results Recent Results (from the past 240 hour(s))  Blood Culture (routine x 2)  Status: None   Collection Time: 09/14/2020 10:00 PM   Specimen: BLOOD  Result Value Ref Range Status   Specimen Description   Final    BLOOD LEFT ANTECUBITAL Performed at [facility], [place] [address] [place], [place] [address]    Special Requests   Final    BOTTLES DRAWN AEROBIC AND ANAEROBIC Blood Culture adequate volume Performed at [facility], [address], [place], [place] [address]     Culture   Final    NO GROWTH 5 DAYS Performed at [facility], [place] [address], [place], [place] [address]    Report Status 09/19/2020 FINAL  Final  SARS Coronavirus 2 by RT PCR (hospital order, performed in [facility] hospital lab) Nasopharyngeal Nasopharyngeal Swab     Status: Abnormal   Collection Time: 09/10/2020 10:07 PM   Specimen: Nasopharyngeal Swab  Result Value Ref Range Status   SARS Coronavirus 2 POSITIVE (A) NEGATIVE Final    Comment: RESULT CALLED TO, READ BACK BY AND VERIFIED WITH: NEAL,K, RN @ 5631 09/21/2020 BY GWYN,P (NOTE) SARS-CoV-2 target nucleic acids are DETECTED  SARS-CoV-2 RNA is generally detectable in upper respiratory specimens  during the acute phase of infection.  Positive results are indicative  of the presence of the identified virus, but do not rule out bacterial infection or co-infection with other pathogens not detected by the test.  Clinical correlation with patient history and  other diagnostic information is necessary to determine patient infection status.  The expected result is negative.  Fact Sheet for Patients:   StrictlyIdeas.no   Fact Sheet for Healthcare Providers:   BankingDealers.co.za    This test is not yet approved or cleared by the [place] FDA and  has been authorized for detection and/or diagnosis of SARS-CoV-2 by FDA under an Emergency Use Authorization (EUA).  This EUA will remain in effect (meaning this t est can be used) for the duration of  the COVID-19 declaration under Section 564(b)(1) of the Act, 21 U.S.C. section 360-bbb-3(b)(1), unless the authorization is terminated or revoked sooner.  Performed at [facility], [address] [place], [place] [address]   MRSA PCR Screening     Status: None   Collection Time: 09/15/20 12:14 AM   Specimen: Nasal Mucosa; Nasopharyngeal  Result Value Ref Range Status   MRSA by PCR NEGATIVE NEGATIVE Final     Comment:        The GeneXpert MRSA Assay (FDA approved for NASAL specimens only), is one component of a comprehensive MRSA colonization surveillance program. It is not intended to diagnose MRSA infection nor to guide or monitor treatment for MRSA infections. Performed at [facility], [place] [address], [place], [place] [address]     Radiology Reports MR BRAIN WO CONTRAST  Result Date: 09/22/2020 CLINICAL DATA:  Delirium.  COVID positive EXAM: MRI HEAD WITHOUT CONTRAST TECHNIQUE: Multiplanar, multiecho pulse sequences of the brain and surrounding structures were obtained without intravenous contrast. COMPARISON:  None. FINDINGS: Brain: Extensive acute infarction in the posterior circulation, patchily present throughout the left more than right cerebellum with right cerebellar involvement mainly at the superior cerebellar distribution and left-sided involvement both at the PICA and superior cerebellar arteries. Right more than left PCA involvement involving the bilateral thalamus and right more than left occipital cortex. Right more than left hippocampal region involvement. No anterior circulation infarct is seen. No hemorrhage, hydrocephalus, or masslike finding. Mild chronic small vessel disease. Cerebral volume loss. Vascular:  A right vertebral and basilar flow void or visible. The left vertebral artery could be hypoplastic or diseased. Skull and upper cervical spine: Normal marrow signal. Cervical spine degeneration with C3-4 and C4-5 anterolisthesis Sinuses/Orbits: Bilateral cataract resection. Nasal septal perforation. Other: Progressively motion degraded study ASAP these results will be called to the ordering clinician or representative by the Radiologist Assistant, and communication documented in the PACS or [employer]. 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 Electronically Signed   By: Monte Fantasia M.D.   On: 09/22/2020 11:45   DG Chest  Portable 1 View  Result Date: 08/30/2020 CLINICAL DATA:  Cough, congestion, fever, loss of taste and smell for 1 week EXAM: PORTABLE CHEST 1 VIEW COMPARISON:  01/11/2014 FINDINGS: Single frontal view of the chest demonstrates stable enlarged cardiac silhouette. There is diffuse increased interstitial prominence with bilateral ground-glass consolidation. No effusion or pneumothorax. No acute bony abnormalities. Bilateral shoulder arthroplasties. IMPRESSION: 1. Diffuse interstitial and ground-glass opacities, consistent with bilateral atypical pneumonia given clinical history. Electronically Signed   By: Randa Ngo M.D.   On: 09/06/2020 23:33

## 2020-09-23 NOTE — Progress Notes (Signed)
PT Cancellation Note  Patient Details Name: April Brewer MRN: [id number] DOB: June 25, 1946   Cancelled Treatment:    Reason Eval/Treat Not Completed: Patient at procedure or test/unavailable  Noted pt found to have new CVA.  Attempted to see pt in pm but pt having ECHO and lethargic.  Will f/u as able. Abran Richard, PT Acute Rehab Services Pager [phone] [facility] [phone]    Karlton Lemon 09/23/2020, 5:11 PM

## 2020-09-23 NOTE — Progress Notes (Signed)
  Echocardiogram 2D Echocardiogram has been performed.  April Brewer 09/23/2020, 5:00 PM

## 2020-09-24 ENCOUNTER — Other Ambulatory Visit (HOSPITAL_COMMUNITY): Payer: Medicare HMO

## 2020-09-24 ENCOUNTER — Inpatient Hospital Stay (HOSPITAL_COMMUNITY): Payer: Medicare HMO

## 2020-09-24 DIAGNOSIS — I6330 Cerebral infarction due to thrombosis of unspecified cerebral artery: Secondary | ICD-10-CM | POA: Diagnosis not present

## 2020-09-24 DIAGNOSIS — J9601 Acute respiratory failure with hypoxia: Secondary | ICD-10-CM | POA: Diagnosis not present

## 2020-09-24 DIAGNOSIS — U071 COVID-19: Secondary | ICD-10-CM | POA: Diagnosis not present

## 2020-09-24 DIAGNOSIS — J1282 Pneumonia due to Coronavirus disease 2019: Secondary | ICD-10-CM | POA: Diagnosis not present

## 2020-09-24 LAB — PROCALCITONIN: Procalcitonin: <0.1 ng/mL

## 2020-09-24 LAB — C-REACTIVE PROTEIN: CRP: 8.5 mg/dL — ABNORMAL HIGH (ref <1.0)

## 2020-09-24 LAB — COMPREHENSIVE METABOLIC PANEL
ALT: 31 U/L (ref 0–44)
AST: 42 U/L — ABNORMAL HIGH (ref 15–41)
Albumin: 2.8 g/dL — ABNORMAL LOW (ref 3.5–5.0)
Alkaline Phosphatase: 86 U/L (ref 38–126)
Anion gap: 14 (ref 5–15)
BUN: 21 mg/dL (ref 8–23)
CO2: 19 mmol/L — ABNORMAL LOW (ref 22–32)
Calcium: 9.2 mg/dL (ref 8.9–10.3)
Chloride: 110 mmol/L (ref 98–111)
Creatinine, Ser: 0.84 mg/dL (ref 0.44–1.00)
GFR calc Af Amer: >60 mL/min (ref >60)
GFR calc non Af Amer: >60 mL/min (ref >60)
Glucose, Bld: 103 mg/dL — ABNORMAL HIGH (ref 70–99)
Potassium: 4.3 mmol/L (ref 3.5–5.1)
Sodium: 143 mmol/L (ref 135–145)
Total Bilirubin: 1.4 mg/dL — ABNORMAL HIGH (ref 0.3–1.2)
Total Protein: 6.6 g/dL (ref 6.5–8.1)

## 2020-09-24 LAB — CBC
HCT: 35.4 % — ABNORMAL LOW (ref 36.0–46.0)
Hemoglobin: 11.6 g/dL — ABNORMAL LOW (ref 12.0–15.0)
MCH: 30.6 pg (ref 26.0–34.0)
MCHC: 32.8 g/dL (ref 30.0–36.0)
MCV: 93.4 fL (ref 80.0–100.0)
Platelets: 430 10*3/uL — ABNORMAL HIGH (ref 150–400)
RBC: 3.79 MIL/uL — ABNORMAL LOW (ref 3.87–5.11)
RDW: 15.3 % (ref 11.5–15.5)
WBC: 16.1 10*3/uL — ABNORMAL HIGH (ref 4.0–10.5)
nRBC: 0 % (ref 0.0–0.2)

## 2020-09-24 LAB — D-DIMER, QUANTITATIVE: D-Dimer, Quant: 1.91 ug/mL-FEU — ABNORMAL HIGH (ref 0.00–0.50)

## 2020-09-24 MED ORDER — IOHEXOL 350 MG/ML SOLN
75.0000 mL | Freq: Once | INTRAVENOUS | Status: AC | PRN
  Administered 2020-09-24: 75 mL via INTRAVENOUS

## 2020-09-24 NOTE — Progress Notes (Signed)
OT Cancellation Note  Patient Details Name: April Brewer MRN: [id number] DOB: 1946/05/05   Cancelled Treatment:    Reason Eval/Treat Not Completed: Medical issues which prohibited therapy;Other (comment). Hold per RN and awaiting MD follow up after CT. Will return as schedule allows.   [place], OTR/L Acute Rehab Pager: [phone] Office: [phone] 09/24/2020, 11:54 AM

## 2020-09-24 NOTE — Progress Notes (Addendum)
  Speech Language Pathology Treatment: Dysphagia  Patient Details Name: April Brewer MRN: [id number] DOB: 02-May-1946 Today's Date: 09/24/2020 Time: 7939-0300 SLP Time Calculation (min) (ACUTE ONLY): 25 min  Assessment / Plan / Recommendation Clinical Impression  Pt seem today to determne readiness for po diet, instrumental swallow evaluation. Pt is alert and very xerostomic.  She was willing to participate in oral care but SlP provided as to not tax her system.  AT baseline pt's oxygen saturation was 90 with no evidence of increased work of breathing.    Pt able to swish and expectorate with water after oral care - recommend continue aggressive oral care for pulmonary health.    PO trials of ice chips offered with pt needing max verbal/visual cues to swallow after melted. Anterior loss of portion of solid ice chip noted x1 due to decreased labial closure.  Pt admits to fear to eat/drink due to coughing.  Each bolus swallowed x2 was followed by strong cough that was not productive - concerning for airway infiltration of secretions and/or water from ice.  Pt's RR increased to 48 and 52 with suspected aspiration episodes as well as oxygen saturation declining to 83.  Pt's respiratory rate and gross weakness continue to be barriers to po allowance.    Pt will benefit from MBS when she is medically ready due to indications of dysphagia/aspiration- In the interim, rec continue ice chips/tsps water for oral care/moisture and leave suction within pt's hand please.    If MD indicates, it may be beneficial to place small bore feeding tube for nutrition at this time. Discussed with MD and plan will be MBS tomorrow 9/29.    Pt agreeable to plan.     HPI HPI: Patient is a 74 y.o. female with PMHx of HLD, hypothyroidism, GERD, lymphocytic colitis, anxiety/depression, chronic back pain on narcotics-presented with several days history of shortness of breath-found to have acute hypoxic respiratory failure  secondary to COVID-19 pneumonia.  Patient started to develop some lethargy 9/24, progressive, with no improvement despite holding Xanax and oxycodone, MRI brain significant for acute CVA 9/26 .desat to 83 on oxygen. 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  Swallow eval ordered.      SLP Plan  Continue with current plan of care       Recommendations  Diet recommendations: NPO (ice chips, swish and expectorat water) Medication Administration: Via alternative means Postural Changes and/or Swallow Maneuvers: Seated upright 90 degrees;Upright 30-60 min after meal                Oral Care Recommendations: Oral care QID Follow up Recommendations: Skilled Nursing facility SLP Visit Diagnosis: Dysphagia, oropharyngeal phase (R13.12) Plan: Continue with current plan of care       GO                Macario Golds 09/24/2020, 8:27 AM  Kathleen Lime, MS [place] Office [phone]

## 2020-09-24 NOTE — Progress Notes (Signed)
PROGRESS NOTE                                                                                                                                                                                                             Patient Demographics:    April Brewer, is a 74 y.o. female, DOB - [birth year], QDI:[id number]  Outpatient Primary MD for the patient is McGowen, Adrian Blackwater, MD   Admit date - 08/30/2020   LOS - 10  Chief Complaint  Patient presents with  . Shortness of Breath       Brief Narrative: Patient is a 74 y.o. female with PMHx of HLD, hypothyroidism, GERD, lymphocytic colitis, anxiety/depression, chronic back pain on narcotics-presented with several days history of shortness of breath-found to have acute hypoxic respiratory failure secondary to COVID-19 pneumonia.  Patient started to develop some lethargy 9/26, progressive, with no improvement despite holding Xanax and oxycodone, MRI brain significant for acute CVA 9/26 .  COVID-19 vaccinated status: Unvaccinated  Significant Events: 9/18>> Admit to [facility] for severe hypoxia due to COVID-19 pneumonia 9/26>> MRI brain significant for acute CVA  Significant studies: 9/17>>Chest x-ray: Diffuse interstitial and groundglass opacities  COVID-19 medications: Steroids:9/17>> Remdesivir: 9/17>> Baricitinib: 9/18>>  Antibiotics: Rocephin: 9/18 x 1 Zithromax: 9/18 x 1  Microbiology data: 9/17 >>blood culture: No growth  Procedures: None  Consults: Neurology  DVT prophylaxis: enoxaparin (LOVENOX) injection 40 mg Start: 09/14/20 1000   Subjective:   No significant events as discussed with staff, she failed swallow evaluation again this morning, patient herself denies any complaints .   Assessment  & Plan :   Acute Hypoxic Resp Failure due to Covid 19 Viral pneumonia:  -Patient with  severe COVID-19 pneumonia, severe hypoxia with significant oxygen requirement  initially, oxygen requirement peaked at 10 to 15 L high flow nasal cannula, this has been gradually improving, he remains on 5 L nasal cannula this morning.   -Continue with IV Solu-Medrol. -treated With remdesivir. -Treated  with baricitinib(hold currently given n.p.o. status) -Continue  to trend inflammatory markers.     Fever: afebrile O2 requirements:  SpO2: 92 % O2 Flow Rate (L/min): 5 L/min   COVID-19 Labs: Recent Labs    09/22/20 0418 09/23/20 0500 09/24/20 0109  DDIMER 2.15* 2.26* 1.91*  CRP 5.3* 9.4* 8.5*       Component Value  Date/Time   BNP 173.5 (H) 09/22/2020 0418    Recent Labs  Lab 09/22/20 0418 09/23/20 0500 09/24/20 0743  PROCALCITON <0.10 <0.10 <0.10    Lab Results  Component Value Date   SARSCOV2NAA POSITIVE (A) 09/10/2020   SARSCOV2NAA NOT DETECTED 06/06/2019     Prone/Incentive Spirometry: encouraged patient to lie prone for 3-4 hours at a time for a total of 16 hours a day, and to encourage incentive spirometry use 3-4/hour.   acute ischemic CVA - Patient is obtunded 9/26, despite holding Xanax and oxycodone. 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 -2D echo with a preserved EF, no embolic source -She remains n.p.o., plan for MBS tomorrow,  if she fails MBS then likely will need Panda tube for feeding. -Discussed with neurology, plan for dual antiplatelet therapy aspirin and Plavix, but for now given she is n.p.o. we will continue with aspirin suppository. -He is being followed by PT/OT/SLP.  Transaminitis:  - Secondary to COVID-19-mild-continue to monitor.  Hyponatremia: - resolved with NS  HLD:  -LDL 72, continue statin  Hypothyroidism:  - Continue Synthroid, changed to IV when she is n.p.o.  Anxiety/depression:  -on  Xanax, Zoloft and lamotrigine. -Stopped her  Xanax due to altered mental status.  Chronic back pain: -Stopped her oxycodone due to altered mental status.  Goals of care discussion: Continue full scope of treatment- short of intubation.  Given her tenuous situation-after extensive discussion with son/patient-we all agree that if patient were to deteriorate significantly-we will not escalate care to include intubation/mechanical ventilation-DNR order placed on 9/20  GI prophylaxis:PPI  ABG:    Component Value Date/Time   PHART 7.464 (H) 09/20/2020 2359   PCO2ART 40.8 09/20/2020 2359   PO2ART 68.2 (L) 09/20/2020 2359   HCO3 28.9 (H) 09/20/2020 2359   TCO2 22 02/03/2015 0326   O2SAT 93.9 09/20/2020 2359    Vent Settings: N/A  Consults: Neurology  Condition - Extremely Guarded  Family Communication  :  Son [name] [phone]) updated on a daily basis  Code Status :   DNR  Diet :  Diet Order    None       Disposition Plan  :   Status is: Inpatient  Remains inpatient appropriate because:Inpatient level of care appropriate due to severity of illness   Dispo: The patient is from: Home              Anticipated d/c is to: Home              Anticipated d/c date is: > 3 days              Patient currently is not medically stable to d/c.    Antimicorbials  :    Anti-infectives (From admission, onward)   Start     Dose/Rate Route Frequency Ordered Stop   09/14/20 2200  cefTRIAXone (ROCEPHIN) 2 g in sodium chloride 0.9 % 100 mL IVPB  Status:  Discontinued        2 g 200 mL/hr over 30 Minutes Intravenous Every 24 hours 09/14/20 2043 09/15/20 1102   09/14/20 2200  azithromycin (ZITHROMAX) 500 mg in sodium chloride 0.9 % 250 mL IVPB  Status:  Discontinued        500 mg 250 mL/hr over 60 Minutes Intravenous Every 24 hours 09/14/20 2043 09/15/20 1102  09/14/20 1000  remdesivir 100 mg in sodium chloride 0.9 % 100 mL IVPB       "Followed by" Linked Group Details   100 mg 200 mL/hr over 30 Minutes Intravenous Daily  09/25/2020 2306 09/17/20 0907   09/05/2020 2330  remdesivir 100 mg in sodium chloride 0.9 % 100 mL IVPB       "Followed by" Linked Group Details   100 mg 200 mL/hr over 30 Minutes Intravenous Every 30 min 09/02/2020 2306 09/14/20 0033      Inpatient Medications  Scheduled Meds: . acidophilus  1 capsule Oral Daily  . albuterol  2 puff Inhalation Q6H  . vitamin C  500 mg Oral Daily  . aspirin  300 mg Rectal Daily  . atorvastatin  10 mg Oral Daily  . enoxaparin (LOVENOX) injection  40 mg Subcutaneous Q24H  . feeding supplement (ENSURE ENLIVE)  237 mL Oral BID BM  . influenza vaccine adjuvanted  0.5 mL Intramuscular Tomorrow-1000  . lamoTRIgine  100 mg Oral Daily  . [START ON 09/26/2020] levothyroxine  40 mcg Intravenous Daily  . mouth rinse  15 mL Mouth Rinse BID  . methylPREDNISolone (SOLU-MEDROL) injection  40 mg Intravenous Q12H  . pantoprazole (PROTONIX) IV  40 mg Intravenous Q24H  . sertraline  100 mg Oral BID  . sodium chloride flush  10-40 mL Intracatheter Q12H  . sodium chloride flush  3 mL Intravenous Q12H  . zinc sulfate  220 mg Oral Daily   Continuous Infusions: . sodium chloride Stopped (09/15/20 0004)  . sodium chloride Stopped (09/14/20 2233)  . sodium chloride 75 mL/hr at 09/23/20 0714   PRN Meds:.sodium chloride, sodium chloride, acetaminophen, chlorpheniramine-HYDROcodone, guaiFENesin-dextromethorphan, ondansetron **OR** ondansetron (ZOFRAN) IV, phenol, sodium chloride flush    See all Orders from today for further details   Phillips Climes M.D on 09/24/2020 at 3:11 PM  To page go to [email or web address] - use universal password  [facility] -  Office  [phone]    Objective:   Vitals:   09/24/20 0720 09/24/20 0727 09/24/20 0900 09/24/20 1235  BP:  (!) 147/91 (!) 145/91 (!) 161/95  Pulse:  89 84 91  Resp: (!) 22  20 20   Temp:  98.3 F (36.8 C) 99.1 F (37.3 C) 98.7 F (37.1 C)  TempSrc:  Axillary Axillary Axillary  SpO2:  91% 95% 92%  Weight:       Height:        Wt Readings from Last 3 Encounters:  09/23/2020 66.2 kg  09/22/20 66.2 kg  09/04/20 69.2 kg     Intake/Output Summary (Last 24 hours) at 09/24/2020 1511 Last data filed at 09/24/2020 0735 Gross per 24 hour  Intake --  Output 700 ml  Net -700 ml     Physical Exam  Patient is awake, interactive, appropriate, follow commands, his speech has slightly improved, but remain with significant aphasia. Symmetrical Chest wall movement, Good air movement bilaterally, CTAB RRR,No Gallops,Rubs or new Murmurs, No Parasternal Heave +ve B.Sounds, Abd Soft, No tenderness, No rebound - guarding or rigidity. No Cyanosis, Clubbing or edema, No new Rash or bruise       Data Review:    CBC Recent Labs  Lab 09/18/20 0730 09/18/20 0730 09/19/20 0616 09/19/20 0616 09/20/20 1129 09/21/20 0304 09/22/20 0418 09/23/20 0500 09/24/20 0109  WBC 9.5   < > 11.7*   < > 11.8* 11.7* 14.8* 10.3 16.1*  HGB 11.4*   < > 10.4*   < > 11.3* 11.1* 11.0* 10.6*  11.6*  HCT 33.6*   < > 30.8*   < > 32.9* 33.0* 34.0* 33.0* 35.4*  PLT 419*   < > 355   < > 404* 389 434* 381 430*  MCV 89.1   < > 89.3   < > 88.9 88.5 92.1 92.2 93.4  MCH 30.2   < > 30.1   < > 30.5 29.8 29.8 29.6 30.6  MCHC 33.9   < > 33.8   < > 34.3 33.6 32.4 32.1 32.8  RDW 14.6   < > 14.5   < > 14.5 14.3 14.6 14.9 15.3  LYMPHSABS 1.6  --  0.8  --   --   --   --   --   --   MONOABS 0.8  --  0.5  --   --   --   --   --   --   EOSABS 0.0  --  0.0  --   --   --   --   --   --   BASOSABS 0.0  --  0.0  --   --   --   --   --   --    < > = values in this interval not displayed.    Chemistries  Recent Labs  Lab 09/18/20 0730 09/18/20 0730 09/19/20 0616 09/19/20 0616 09/20/20 1129 09/21/20 0145 09/22/20 0418 09/23/20 0500 09/24/20 0109  NA 130*   < > 128*   < > 129* 129* 138 141 143  K 4.3   < > 4.8   < > 4.3 5.1 4.2 4.3 4.3  CL 92*   < > 93*   < > 90* 93* 103 108 110  CO2 27   < > 25   < > 28 23 24 24  19*  GLUCOSE 121*   < >  109*   < > 114* 127* 110* 129* 103*  BUN 16   < > 23   < > 20 22 22 23 21   CREATININE 0.87   < > 0.91   < > 0.89 0.95 1.02* 0.96 0.84  CALCIUM 9.0   < > 8.6*   < > 8.8* 8.5* 8.9 9.0 9.2  MG 2.3  --  2.0  --   --   --   --   --   --   AST 43*   < > 43*   < > 40 61* 44* 42* 42*  ALT 29   < > 33   < > 35 34 38 32 31  ALKPHOS 110   < > 91   < > 91 101 97 87 86  BILITOT 0.6   < > 0.7   < > 0.6 1.2 1.0 0.8 1.4*   < > = values in this interval not displayed.   ------------------------------------------------------------------------------------------------------------------ Recent Labs    09/22/20 0418  CHOL 142  HDL 49  LDLCALC 72  TRIG 104  CHOLHDL 2.9    Lab Results  Component Value Date   HGBA1C 6.3 (H) 09/22/2020   ------------------------------------------------------------------------------------------------------------------ No results for input(s): TSH, T4TOTAL, T3FREE, THYROIDAB in the last 72 hours.  Invalid input(s): FREET3 ------------------------------------------------------------------------------------------------------------------ No results for input(s): VITAMINB12, FOLATE, FERRITIN, TIBC, IRON, RETICCTPCT in the last 72 hours.  Coagulation profile No results for input(s): INR, PROTIME in the last 168 hours.  Recent Labs    09/23/20 0500 09/24/20 0109  DDIMER 2.26* 1.91*    Cardiac Enzymes No results for input(s): CKMB, TROPONINI, MYOGLOBIN in the  last 168 hours.  Invalid input(s): CK ------------------------------------------------------------------------------------------------------------------    Component Value Date/Time   BNP 173.5 (H) 09/22/2020 0418    Micro Results Recent Results (from the past 240 hour(s))  MRSA PCR Screening     Status: None   Collection Time: 09/15/20 12:14 AM   Specimen: Nasal Mucosa; Nasopharyngeal  Result Value Ref Range Status   MRSA by PCR NEGATIVE NEGATIVE Final    Comment:        The GeneXpert MRSA Assay  (FDA approved for NASAL specimens only), is one component of a comprehensive MRSA colonization surveillance program. It is not intended to diagnose MRSA infection nor to guide or monitor treatment for MRSA infections. Performed at [facility], [place] [address] [place], [place] [address]     Radiology Reports CT ANGIO HEAD W OR WO CONTRAST  Result Date: 09/24/2020 CLINICAL DATA:  Posterior circulation stroke.  COVID positive. EXAM: CT ANGIOGRAPHY HEAD AND NECK TECHNIQUE: Multidetector CT imaging of the head and neck was performed using the standard protocol during bolus administration of intravenous contrast. Multiplanar CT image reconstructions and MIPs were obtained to evaluate the vascular anatomy. Carotid stenosis measurements (when applicable) are obtained utilizing NASCET criteria, using the distal internal carotid diameter as the denominator. CONTRAST:  35mL OMNIPAQUE IOHEXOL 350 MG/ML SOLN COMPARISON:  MRI 09/22/2020 FINDINGS: CTA NECK FINDINGS Aortic arch: Visualized portions demonstrate atherosclerosis without significant stenosis or aneurysm. Right carotid system: No evidence of dissection, stenosis (50% or greater) or occlusion. Left carotid system: No evidence of dissection, stenosis (50% or greater) or occlusion. Vertebral arteries: Right dominant. There is abrupt occlusion of the left vertebral artery at the level of C2. Reconstitution immediately before the vertebral artery courses intradural E, which may be from retrograde flow and/or collaterals. Skeleton: Multilevel severe degenerative disc disease in the cervical spine. Other neck: No mass lesion or adenopathy. Upper chest: Peripheral predominant ground-glass opacities in the imaged lung apices, compatible with COVID pneumonia. Review of the MIP images confirms the above findings CTA HEAD FINDINGS Anterior circulation: No significant stenosis, proximal occlusion, aneurysm, or vascular malformation. Posterior circulation:  Abrupt inclusion of the distal right P2 PCA (series 5, image 448). a Venous sinuses: As permitted by contrast timing, patent. Additional comments: Partially imaged edema associated with multifocal posterior circulation infarcts that were better characterized on recent MRI. Review of the MIP images confirms the above findings IMPRESSION: 1. 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 2. Partially imaged edema associated with multifocal posterior circulation infarcts that were better characterized on recent MRI. Non-contrast head CT could further evaluate the degree of edema/mass effect if clinically indicated. 3. Peripheral predominant ground-glass opacities in the imaged lung apices, compatible with COVID pneumonia given the clinical history. These results will be called to the ordering clinician or representative by the Radiologist Assistant, and communication documented in the PACS or [employer]. Electronically Signed   By: Margaretha Sheffield MD   On: 09/24/2020 09:45   CT ANGIO NECK W OR WO CONTRAST  Result Date: 09/24/2020 CLINICAL DATA:  Posterior circulation stroke.  COVID positive. EXAM: CT ANGIOGRAPHY HEAD AND NECK TECHNIQUE: Multidetector CT imaging of the head and neck was performed using the standard protocol during bolus administration of intravenous contrast. Multiplanar CT image reconstructions and MIPs were obtained to evaluate the vascular anatomy. Carotid stenosis measurements (when applicable) are obtained utilizing NASCET criteria, using the distal internal carotid diameter as the  denominator. CONTRAST:  39mL OMNIPAQUE IOHEXOL 350 MG/ML SOLN COMPARISON:  MRI 09/22/2020 FINDINGS: CTA NECK FINDINGS Aortic arch: Visualized portions demonstrate atherosclerosis without significant stenosis or aneurysm. Right carotid system: No evidence of dissection, stenosis (50% or greater) or occlusion. Left carotid system: No evidence of  dissection, stenosis (50% or greater) or occlusion. Vertebral arteries: Right dominant. There is abrupt occlusion of the left vertebral artery at the level of C2. Reconstitution immediately before the vertebral artery courses intradural E, which may be from retrograde flow and/or collaterals. Skeleton: Multilevel severe degenerative disc disease in the cervical spine. Other neck: No mass lesion or adenopathy. Upper chest: Peripheral predominant ground-glass opacities in the imaged lung apices, compatible with COVID pneumonia. Review of the MIP images confirms the above findings CTA HEAD FINDINGS Anterior circulation: No significant stenosis, proximal occlusion, aneurysm, or vascular malformation. Posterior circulation: Abrupt inclusion of the distal right P2 PCA (series 5, image 448). a Venous sinuses: As permitted by contrast timing, patent. Additional comments: Partially imaged edema associated with multifocal posterior circulation infarcts that were better characterized on recent MRI. Review of the MIP images confirms the above findings IMPRESSION: 1. 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 2. Partially imaged edema associated with multifocal posterior circulation infarcts that were better characterized on recent MRI. Non-contrast head CT could further evaluate the degree of edema/mass effect if clinically indicated. 3. Peripheral predominant ground-glass opacities in the imaged lung apices, compatible with COVID pneumonia given the clinical history. These results will be called to the ordering clinician or representative by the Radiologist Assistant, and communication documented in the PACS or [employer]. Electronically Signed   By: Margaretha Sheffield MD   On: 09/24/2020 09:45   MR BRAIN WO CONTRAST  Result Date: 09/22/2020 CLINICAL DATA:  Delirium.  COVID positive EXAM: MRI HEAD WITHOUT CONTRAST TECHNIQUE: Multiplanar, multiecho pulse  sequences of the brain and surrounding structures were obtained without intravenous contrast. COMPARISON:  None. FINDINGS: Brain: Extensive acute infarction in the posterior circulation, patchily present throughout the left more than right cerebellum with right cerebellar involvement mainly at the superior cerebellar distribution and left-sided involvement both at the PICA and superior cerebellar arteries. Right more than left PCA involvement involving the bilateral thalamus and right more than left occipital cortex. Right more than left hippocampal region involvement. No anterior circulation infarct is seen. No hemorrhage, hydrocephalus, or masslike finding. Mild chronic small vessel disease. Cerebral volume loss. Vascular: A right vertebral and basilar flow void or visible. The left vertebral artery could be hypoplastic or diseased. Skull and upper cervical spine: Normal marrow signal. Cervical spine degeneration with C3-4 and C4-5 anterolisthesis Sinuses/Orbits: Bilateral cataract resection. Nasal septal perforation. Other: Progressively motion degraded study ASAP these results will be called to the ordering clinician or representative by the Radiologist Assistant, and communication documented in the PACS or [employer]. 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 Electronically Signed   By: Monte Fantasia M.D.   On: 09/22/2020 11:45   DG Chest Portable 1 View  Result Date: 09/23/2020 CLINICAL DATA:  Cough, congestion, fever, loss of taste and smell for 1 week EXAM: PORTABLE CHEST 1 VIEW COMPARISON:  01/11/2014 FINDINGS: Single frontal view of the chest demonstrates stable enlarged cardiac silhouette. There is diffuse increased interstitial prominence with bilateral ground-glass consolidation. No effusion or pneumothorax. No acute bony abnormalities. Bilateral shoulder arthroplasties. IMPRESSION: 1. Diffuse interstitial and ground-glass opacities, consistent with  bilateral atypical pneumonia given clinical history. Electronically Signed   By: Randa Ngo M.D.   On: 08/29/2020 23:33   ECHOCARDIOGRAM COMPLETE  Result Date: 09/23/2020    ECHOCARDIOGRAM REPORT   Patient Name:   MICHELINA MEXICANO Date of Exam: 09/23/2020 Medical Rec #:  [id number]       Height:       64.0 in Accession #:    [id number]      Weight:       145.9 lb Date of Birth:  [birth year]       BSA:          28.711 m Patient Age:    34 years        BP:           139/82 mmHg Patient Gender: F               HR:           84 bpm. Exam Location:  Inpatient Procedure: 2D Echo, Cardiac Doppler and Color Doppler Indications:    Stroke 434.91 / I163.9  History:        Patient has no prior history of Echocardiogram examinations.                 Signs/Symptoms:Murmur; Risk Factors:Dyslipidemia. COVID-19                 Positive. CKD. GERD. Hypothyroidism.  Sonographer:    Jonelle Sidle Dance Referring Phys: [id number]  1. Left ventricular ejection fraction, by estimation, is 65 to 70%. The left ventricle has normal function. The left ventricle has no regional wall motion abnormalities. There is mild concentric left ventricular hypertrophy. Left ventricular diastolic parameters are consistent with Grade I diastolic dysfunction (impaired relaxation).  2. Right ventricular systolic function is normal. The right ventricular size is normal.  3. Left atrial size was mildly dilated.  4. The mitral valve is normal in structure. Mild mitral valve regurgitation. No evidence of mitral stenosis. Moderate mitral annular calcification.  5. The aortic valve is normal in structure. Aortic valve regurgitation is not visualized. No aortic stenosis is present.  6. The inferior vena cava is normal in size with greater than 50% respiratory variability, suggesting right atrial pressure of 3 mmHg. Comparison(s): No prior Echocardiogram. Conclusion(s)/Recommendation(s): No intracardiac source of embolism detected on this  transthoracic study. A transesophageal echocardiogram is recommended to exclude cardiac source of embolism if clinically indicated. FINDINGS  Left Ventricle: Left ventricular ejection fraction, by estimation, is 65 to 70%. The left ventricle has normal function. The left ventricle has no regional wall motion abnormalities. The left ventricular internal cavity size was normal in size. There is  mild concentric left ventricular hypertrophy. Left ventricular diastolic parameters are consistent with Grade I diastolic dysfunction (impaired relaxation). Normal left ventricular filling pressure. Right Ventricle: The right ventricular size is normal. No increase in right ventricular wall thickness. Right ventricular systolic function is normal. Left Atrium: Left atrial size was mildly dilated. Right Atrium: Right atrial size was normal in size. Pericardium: There is no evidence of pericardial effusion. Mitral Valve: The mitral valve is normal in structure. There is mild thickening of the mitral valve leaflet(s). There is mild calcification of the mitral valve leaflet(s). Moderate mitral annular calcification. Mild mitral valve regurgitation. No evidence of mitral valve stenosis. Tricuspid Valve: The tricuspid valve is normal in structure. Tricuspid valve regurgitation is mild . No evidence of tricuspid stenosis. Aortic Valve: The aortic valve is normal  in structure. Aortic valve regurgitation is not visualized. No aortic stenosis is present. Pulmonic Valve: The pulmonic valve was normal in structure. Pulmonic valve regurgitation is not visualized. No evidence of pulmonic stenosis. Aorta: The aortic root is normal in size and structure. Venous: The inferior vena cava is normal in size with greater than 50% respiratory variability, suggesting right atrial pressure of 3 mmHg. IAS/Shunts: No atrial level shunt detected by color flow Doppler.  LEFT VENTRICLE PLAX 2D LVIDd:         3.80 cm  Diastology LVIDs:         2.40 cm  LV  e' lateral:   9.46 cm/s LV PW:         1.10 cm  LV E/e' lateral: 8.7 LV IVS:        1.10 cm LVOT diam:     2.10 cm LV SV:         105 LV SV Index:   61 LVOT Area:     3.46 cm  RIGHT VENTRICLE             IVC RV Basal diam:  2.00 cm     IVC diam: 1.20 cm RV S prime:     13.90 cm/s TAPSE (M-mode): 1.5 cm LEFT ATRIUM             Index       RIGHT ATRIUM          Index LA diam:        4.00 cm 2.34 cm/m  RA Area:     8.10 cm LA Vol (A2C):   48.1 ml 28.11 ml/m RA Volume:   13.00 ml 7.60 ml/m LA Vol (A4C):   34.5 ml 20.16 ml/m LA Biplane Vol: 42.3 ml 24.72 ml/m  AORTIC VALVE LVOT Vmax:   148.00 cm/s LVOT Vmean:  98.300 cm/s LVOT VTI:    0.302 m  AORTA Ao Root diam: 3.20 cm Ao Asc diam:  3.10 cm MITRAL VALVE MV Area (PHT): 2.53 cm     SHUNTS MV Decel Time: 300 msec     Systemic VTI:  0.30 m MV E velocity: 81.90 cm/s   Systemic Diam: 2.10 cm MV A velocity: 133.00 cm/s MV E/A ratio:  0.62 Ena Dawley MD Electronically signed by Ena Dawley MD Signature Date/Time: 09/23/2020/5:19:20 PM    Final

## 2020-09-24 NOTE — Plan of Care (Signed)
Stroke care plan created. Stroke education materials given. Pt verbalized understanding.

## 2020-09-24 NOTE — Evaluation (Signed)
Physical Therapy Re-Evaluation Patient Details Name: April Brewer MRN: [id number] DOB: [birth year] Today's Date: 09/24/2020   History of Present Illness  Pt is 74 y.o. female with medical history significant of hyperlipidemia, hypothyroidism, GERD, lymphocytic colitis, anxiety, and depression presented with complaints of shortness of breath and cough over the last 2 - 3 days. Noted associated symptoms of fever, loss of taste/smell, poor appetite, nausea, generalized weakness, and diarrhea. COVID-19 screening was positive. Pt admitted with COVID 19 PNE resp failure and on high flow oxygen.  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  Clinical Impression   Pt seen for PT Reevaluation today due to new CVA.  Spoke with RN prior to treatment - who requested limited treatment to chair only due to new CTA results.  Pt now presenting with ataxia throughout extremities with dysmetria and decreased coordination.  She has generalized weakness with decreased mobility, balance, and endurance.  She was on less O2 than last treatment with sats decreasing to 84% but with quick recovery.  Required multimodal cues for sequencing and transfer techniques.  Due to new CVA and change in status - goals, d/c recommendations, and POC was updated.    Follow Up Recommendations CIR    Equipment Recommendations  None recommended by PT    Recommendations for Other Services Rehab consult     Precautions / Restrictions Precautions Precautions: Fall Precaution Comments: watch sats      Mobility  Bed Mobility Overal bed mobility: Needs Assistance Bed Mobility: Supine to Sit     Supine to sit: Mod assist;HOB elevated     General bed mobility comments: Multimodal cues for sequencing; mod A with bed pad to facilitate  scoot forward  Transfers Overall transfer level: Needs assistance Equipment used: 1 person hand held assist Transfers: Sit to/from [facility] Sit to Stand: Mod assist Stand pivot transfers: Mod assist       General transfer comment: Cues for hand placement leaning forward and mod A to stand.  Mod A to steady for pivot/small steps to chair with pt holding therapist arms  Ambulation/Gait Ambulation/Gait assistance: Mod assist Gait Distance (Feet): 1 Feet Assistive device: 1 person hand held assist Gait Pattern/deviations: Shuffle;Ataxic Gait velocity: decreased   General Gait Details: Small steps to chair during stand pivot  Stairs            Wheelchair Mobility    Modified Rankin (Stroke Patients Only)       Balance Overall balance assessment: Needs assistance Sitting-balance support: Feet supported;Bilateral upper extremity supported Sitting balance-Leahy Scale: Poor Sitting balance - Comments: Requiring bil UE for balance   Standing balance support: Bilateral upper extremity supported Standing balance-Leahy Scale: Poor Standing balance comment: Requiring mod A from therapist                             Pertinent Vitals/Pain Pain Assessment: No/denies pain    Home Living                        Prior Function                 Hand Dominance        Extremity/Trunk Assessment   Upper Extremity Assessment Upper Extremity Assessment:  (  Demonstrating grossly 3/5 strength throughout, did note dysmetria, ataxia, and decreased coordination bil UE; defer to OT for further detail)    Lower Extremity Assessment Lower Extremity Assessment: LLE deficits/detail;RLE deficits/detail RLE Deficits / Details: ROM WFL; MMT 4/5; ataxia with steps RLE Coordination: decreased gross motor (difficulty with heel/shin task) LLE Deficits / Details: ROM WFL: MMT 4/5; ataxia with steps LLE Coordination: decreased gross motor  (difficulty with heel/shin task)    Cervical / Trunk Assessment Cervical / Trunk Assessment: Normal  Communication      Cognition Arousal/Alertness: Awake/alert Behavior During Therapy: WFL for tasks assessed/performed Overall Cognitive Status: Impaired/Different from baseline Area of Impairment: Orientation;Attention;Memory;Safety/judgement;Problem solving;Following commands;Awareness                 Orientation Level: Disoriented to;Time;Situation Current Attention Level: Sustained Memory: Decreased short-term memory Following Commands: Follows one step commands consistently;Follows multi-step commands inconsistently Safety/Judgement: Decreased awareness of safety;Decreased awareness of deficits   Problem Solving: Slow processing;Decreased initiation;Requires verbal cues;Difficulty sequencing General Comments: Pt more alert today but requiring increased cues for task.      General Comments General comments (skin integrity, edema, etc.): Pt on 5 L O2 with sats 92% rest, down to 84% transfer but with quick recovery.  HR and BP stable.    Exercises     Assessment/Plan    PT Assessment Patient needs continued PT services  PT Problem List Decreased strength;Decreased activity tolerance;Decreased balance;Decreased mobility;Decreased knowledge of use of DME;Decreased knowledge of precautions;Cardiopulmonary status limiting activity;Decreased safety awareness;Decreased coordination;Decreased cognition       PT Treatment Interventions DME instruction;Gait training;Therapeutic activities;Therapeutic exercise;Patient/family education;Cognitive remediation;Balance training;Neuromuscular re-education;Functional mobility training    PT Goals (Current goals can be found in the Care Plan section)  Acute Rehab PT Goals Patient Stated Goal: not stated PT Goal Formulation: With patient Time For Goal Achievement: 10/08/20 Potential to Achieve Goals: Good    Frequency Min 4X/week    Barriers to discharge        Co-evaluation               AM-PAC PT "6 Clicks" Mobility  Outcome Measure Help needed turning from your back to your side while in a flat bed without using bedrails?: A Lot Help needed moving from lying on your back to sitting on the side of a flat bed without using bedrails?: A Lot Help needed moving to and from a bed to a chair (including a wheelchair)?: A Lot Help needed standing up from a chair using your arms (e.g., wheelchair or bedside chair)?: A Lot Help needed to walk in hospital room?: Total Help needed climbing 3-5 steps with a railing? : Total 6 Click Score: 10    End of Session Equipment Utilized During Treatment: Oxygen Activity Tolerance: Patient tolerated treatment well Patient left: with call bell/phone within reach;in chair;with chair alarm set Nurse Communication: Mobility status PT Visit Diagnosis: Muscle weakness (generalized) (M62.81);Difficulty in walking, not elsewhere classified (R26.2);Ataxic gait (R26.0);Other symptoms and signs involving the nervous system (R29.898)    Time: 8242-3536 PT Time Calculation (min) (ACUTE ONLY): 24 min   Charges:   PT Evaluation $PT Re-evaluation: 1 Re-eval PT Treatments $Therapeutic Activity: 8-22 mins        Abran Richard, PT Acute Rehab Services Pager [phone] [facility] [phone]    Karlton Lemon 09/24/2020, 5:48 PM

## 2020-09-24 NOTE — Progress Notes (Addendum)
CRITICAL VALUE ALERT  Critical Value:  CTA of head and neck  Date & Time Notied:  09/24/2020 1005  Provider Notified: Dr. Waldron Labs  Orders Received/Actions taken: MD notified.

## 2020-09-24 NOTE — Progress Notes (Signed)
STROKE MD BRIEF PROGRESS NOTE:  Patient not examined due to constraints from Covid pandemic with reviewed chart.  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  2D echo is unremarkable. Recommend swallow eval and if patient is able to swallow aspirin 81 mg and Plavix 75 mg daily for 3 months followed by aspirin alone.  May use aspirin suppository alone until patient is able to swallow or has a feeding tube.  Continue Lovenox for DVT prophylaxis.  Discussed with Dr. Waldron Labs.  Stroke team will sign off.  Kindly call for questions.  Antony Contras, MD

## 2020-09-25 ENCOUNTER — Encounter: Payer: Self-pay | Admitting: Family Medicine

## 2020-09-25 DIAGNOSIS — J1282 Pneumonia due to Coronavirus disease 2019: Secondary | ICD-10-CM | POA: Diagnosis not present

## 2020-09-25 DIAGNOSIS — U071 COVID-19: Secondary | ICD-10-CM | POA: Diagnosis not present

## 2020-09-25 DIAGNOSIS — R7401 Elevation of levels of liver transaminase levels: Secondary | ICD-10-CM | POA: Diagnosis not present

## 2020-09-25 DIAGNOSIS — J9601 Acute respiratory failure with hypoxia: Secondary | ICD-10-CM | POA: Diagnosis not present

## 2020-09-25 MED ORDER — LORAZEPAM 2 MG/ML IJ SOLN
0.5000 mg | Freq: Four times a day (QID) | INTRAMUSCULAR | Status: DC | PRN
  Administered 2020-10-07: 0.5 mg via INTRAVENOUS
  Filled 2020-09-25: qty 1

## 2020-09-25 MED ORDER — MORPHINE SULFATE (PF) 2 MG/ML IV SOLN
1.0000 mg | INTRAVENOUS | Status: DC | PRN
  Administered 2020-10-07 (×2): 1 mg via INTRAVENOUS
  Filled 2020-09-25 (×4): qty 1

## 2020-09-25 NOTE — Progress Notes (Signed)
PROGRESS NOTE                                                                                                                                                                                                             Patient Demographics:    April Brewer, is a 74 y.o. female, DOB - 06/03/1946, POE:[id number]  Outpatient Primary MD for the patient is McGowen, Adrian Blackwater, MD   Admit date - 09/02/2020   LOS - 64  Chief Complaint  Patient presents with  . Shortness of Breath       Brief Narrative: Patient is a 74 y.o. female with PMHx of HLD, hypothyroidism, GERD, lymphocytic colitis, anxiety/depression, chronic back pain on narcotics-presented with several days history of shortness of breath-found to have acute hypoxic respiratory failure secondary to COVID-19 pneumonia.  Patient was admitted to the hospitalist service-started on COVID-19 treatment-unfortunately further hospital course complicated by acute CVA.  See below for further details.    COVID-19 vaccinated status: Unvaccinated  Significant Events: 9/18>> Admit to [facility] for severe hypoxia due to COVID-19 pneumonia 9/26>> worsening lethargy-MRI brain significant for acute CVA  Significant studies: 9/17>>Chest x-ray: Diffuse interstitial and groundglass opacities 9/26>> MRI brain: Multifocal acute infarcts in the posterior circulation. 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  No major issues overnight.   Assessment  & Plan :   Acute Hypoxic Resp Failure due to Covid 19 Viral pneumonia: Had severe hypoxemia on initial presentation requiring 15 L of HFNC-gradually improving-down to 5 L.  Has completed a course of remdesivir-on tapering doses of Solu-Medrol.  On baricitinib-but not given for the past several days as patient n.p.o. following CVA.  Continued attempts to slowly titrate down FiO2.    Fever: afebrile O2 requirements:  SpO2: 97 % O2 Flow Rate (L/min): 5 L/min   COVID-19 Labs: Recent Labs  09/23/20 0500 09/24/20 0109  DDIMER 2.26* 1.91*  CRP 9.4* 8.5*       Component Value Date/Time   BNP 173.5 (H) 09/22/2020 0418    Recent Labs  Lab 09/22/20 0418 09/23/20 0500 09/24/20 0743  PROCALCITON <0.10 <0.10 <0.10    Lab Results  Component Value Date   SARSCOV2NAA POSITIVE (A) 08/28/2020   SARSCOV2NAA NOT DETECTED 06/06/2019     Prone/Incentive Spirometry: encouraged patient to lie prone for 3-4 hours at a time for a total of 16 hours a day, and to encourage incentive spirometry use 3-4/hour.  Acute ischemic CVA: Felt to be due to hypercoagulable state in the setting of COVID-19-telemetry without any arrhythmias-echo without any obvious embolic source.  Currently n.p.o. and on aspirin suppository-neurology recommends once diet established-to transition to aspirin/Plavix for 3 months followed by aspirin alone.  Dysphagia: Secondary to CVA-spoke with SLP-MBS scheduled for 9/30.  Transaminitis: Improved-secondary to COVID-19.  Hyponatremia: Resolved.  HLD: On statin  Hypothyroidism: Continue Synthroid-change back to p.o. route when oral intake established.  Anxiety/depression: Relatively stable-Xanax/lamotrigine/Zoloft unable to be given due to mental status changes/n.p.o. status-placed on as needed IV Ativan as patient has been on chronic benzos as an outpatient.  Chronic back  pain: Unable to give oxycodone is n.p.o.-start as needed IV morphine-patient on chronic narcotics at home.  Goals of care discussion: Continue full scope of treatment- short of intubation.  Given her tenuous situation-after extensive discussion with son/patient-we all agree that if patient were to deteriorate significantly-we will not escalate care to include intubation/mechanical ventilation-DNR order placed on 9/20  GI prophylaxis:PPI  ABG:    Component Value Date/Time   PHART 7.464 (H) 09/20/2020 2359   PCO2ART 40.8 09/20/2020 2359   PO2ART 68.2 (L) 09/20/2020 2359   HCO3 28.9 (H) 09/20/2020 2359   TCO2 22 02/03/2015 0326   O2SAT 93.9 09/20/2020 2359    Vent Settings: N/A  Consults: Neurology  Condition - Extremely Guarded  Family Communication  :  Son [name] [phone]) updated 9/29  Code Status :   DNR  Diet :  Diet Order    None       Disposition Plan  :   Status is: Inpatient  Remains inpatient appropriate because:Inpatient level of care appropriate due to severity of illness   Dispo: The patient is from: Home              Anticipated d/c is to: SNF versus CIR.              Anticipated d/c date is: > 3 days              Patient currently is not medically stable to d/c.    Antimicorbials  :    Anti-infectives (From admission, onward)   Start     Dose/Rate Route Frequency Ordered Stop   09/14/20 2200  cefTRIAXone (ROCEPHIN) 2 g in sodium chloride 0.9 % 100 mL IVPB  Status:  Discontinued        2 g 200 mL/hr over 30 Minutes Intravenous Every 24 hours 09/14/20 2043 09/15/20 1102   09/14/20 2200  azithromycin (ZITHROMAX) 500 mg in sodium chloride 0.9 % 250 mL IVPB  Status:  Discontinued        500 mg 250 mL/hr over 60 Minutes Intravenous Every 24 hours 09/14/20 2043 09/15/20 1102   09/14/20 1000  remdesivir 100 mg in sodium chloride 0.9 % 100 mL IVPB       "Followed by" Linked Group Details  100 mg 200 mL/hr over 30 Minutes Intravenous Daily 09/23/2020  2306 09/17/20 0907   09/18/2020 2330  remdesivir 100 mg in sodium chloride 0.9 % 100 mL IVPB       "Followed by" Linked Group Details   100 mg 200 mL/hr over 30 Minutes Intravenous Every 30 min 09/24/2020 2306 09/14/20 0033      Inpatient Medications  Scheduled Meds: . acidophilus  1 capsule Oral Daily  . albuterol  2 puff Inhalation Q6H  . vitamin C  500 mg Oral Daily  . aspirin  300 mg Rectal Daily  . atorvastatin  10 mg Oral Daily  . enoxaparin (LOVENOX) injection  40 mg Subcutaneous Q24H  . feeding supplement (ENSURE ENLIVE)  237 mL Oral BID BM  . influenza vaccine adjuvanted  0.5 mL Intramuscular Tomorrow-1000  . lamoTRIgine  100 mg Oral Daily  . [START ON 09/26/2020] levothyroxine  40 mcg Intravenous Daily  . mouth rinse  15 mL Mouth Rinse BID  . methylPREDNISolone (SOLU-MEDROL) injection  40 mg Intravenous Q12H  . pantoprazole (PROTONIX) IV  40 mg Intravenous Q24H  . sertraline  100 mg Oral BID  . sodium chloride flush  10-40 mL Intracatheter Q12H  . sodium chloride flush  3 mL Intravenous Q12H  . zinc sulfate  220 mg Oral Daily   Continuous Infusions: . sodium chloride Stopped (09/15/20 0004)  . sodium chloride Stopped (09/14/20 2233)  . sodium chloride 75 mL/hr at 09/25/20 0852   PRN Meds:.sodium chloride, sodium chloride, acetaminophen, chlorpheniramine-HYDROcodone, guaiFENesin-dextromethorphan, ondansetron **OR** ondansetron (ZOFRAN) IV, phenol, sodium chloride flush    See all Orders from today for further details   Oren Binet M.D on 09/25/2020 at 3:04 PM  To page go to [email or web address] - use universal password  [facility] -  Office  [phone]    Objective:   Vitals:   09/25/20 0437 09/25/20 0715 09/25/20 0731 09/25/20 1200  BP: (!) 148/88  (!) 151/99 (!) 137/96  Pulse: 86  98 98  Resp: 20 20 19  (!) 21  Temp: 98.2 F (36.8 C)  98.3 F (36.8 C) 97.8 F (36.6 C)  TempSrc: Axillary  Axillary Oral  SpO2: 93%  93% 97%  Weight:      Height:         Wt Readings from Last 3 Encounters:  09/07/2020 66.2 kg  09/22/20 66.2 kg  09/04/20 69.2 kg    No intake or output data in the 24 hours ending 09/25/20 1504   Physical Exam  Gen Exam:Alert awake-not in any distress-speech is obviously dysarthric. HEENT:atraumatic, normocephalic Chest: B/L clear to auscultation anteriorly CVS:S1S2 regular Abdomen:soft non tender, non distended Extremities:no edema Neurology: Moving all 4 extremities.   Skin: no rash     Data Review:    CBC Recent Labs  Lab 09/19/20 0616 09/19/20 0616 09/20/20 1129 09/21/20 0304 09/22/20 0418 09/23/20 0500 09/24/20 0109  WBC 11.7*   < > 11.8* 11.7* 14.8* 10.3 16.1*  HGB 10.4*   < > 11.3* 11.1* 11.0* 10.6* 11.6*  HCT 30.8*   < > 32.9* 33.0* 34.0* 33.0* 35.4*  PLT 355   < > 404* 389 434* 381 430*  MCV 89.3   < > 88.9 88.5 92.1 92.2 93.4  MCH 30.1   < > 30.5 29.8 29.8 29.6 30.6  MCHC 33.8   < > 34.3 33.6 32.4 32.1 32.8  RDW 14.5   < > 14.5 14.3 14.6 14.9 15.3  LYMPHSABS 0.8  --   --   --   --   --   --  MONOABS 0.5  --   --   --   --   --   --   EOSABS 0.0  --   --   --   --   --   --   BASOSABS 0.0  --   --   --   --   --   --    < > = values in this interval not displayed.    Chemistries  Recent Labs  Lab 09/19/20 0616 09/19/20 0616 09/20/20 1129 09/21/20 0145 09/22/20 0418 09/23/20 0500 09/24/20 0109  NA 128*   < > 129* 129* 138 141 143  K 4.8   < > 4.3 5.1 4.2 4.3 4.3  CL 93*   < > 90* 93* 103 108 110  CO2 25   < > 28 23 24 24  19*  GLUCOSE 109*   < > 114* 127* 110* 129* 103*  BUN 23   < > 20 22 22 23 21   CREATININE 0.91   < > 0.89 0.95 1.02* 0.96 0.84  CALCIUM 8.6*   < > 8.8* 8.5* 8.9 9.0 9.2  MG 2.0  --   --   --   --   --   --   AST 43*   < > 40 61* 44* 42* 42*  ALT 33   < > 35 34 38 32 31  ALKPHOS 91   < > 91 101 97 87 86  BILITOT 0.7   < > 0.6 1.2 1.0 0.8 1.4*   < > = values in this interval not displayed.    ------------------------------------------------------------------------------------------------------------------ No results for input(s): CHOL, HDL, LDLCALC, TRIG, CHOLHDL, LDLDIRECT in the last 72 hours.  Lab Results  Component Value Date   HGBA1C 6.3 (H) 09/22/2020   ------------------------------------------------------------------------------------------------------------------ No results for input(s): TSH, T4TOTAL, T3FREE, THYROIDAB in the last 72 hours.  Invalid input(s): FREET3 ------------------------------------------------------------------------------------------------------------------ No results for input(s): VITAMINB12, FOLATE, FERRITIN, TIBC, IRON, RETICCTPCT in the last 72 hours.  Coagulation profile No results for input(s): INR, PROTIME in the last 168 hours.  Recent Labs    09/23/20 0500 09/24/20 0109  DDIMER 2.26* 1.91*    Cardiac Enzymes No results for input(s): CKMB, TROPONINI, MYOGLOBIN in the last 168 hours.  Invalid input(s): CK ------------------------------------------------------------------------------------------------------------------    Component Value Date/Time   BNP 173.5 (H) 09/22/2020 1448    Micro Results No results found for this or any previous visit (from the past 240 hour(s)).  Radiology Reports CT ANGIO HEAD W OR WO CONTRAST  Result Date: 09/24/2020 CLINICAL DATA:  Posterior circulation stroke.  COVID positive. EXAM: CT ANGIOGRAPHY HEAD AND NECK TECHNIQUE: Multidetector CT imaging of the head and neck was performed using the standard protocol during bolus administration of intravenous contrast. Multiplanar CT image reconstructions and MIPs were obtained to evaluate the vascular anatomy. Carotid stenosis measurements (when applicable) are obtained utilizing NASCET criteria, using the distal internal carotid diameter as the denominator. CONTRAST:  9mL OMNIPAQUE IOHEXOL 350 MG/ML SOLN COMPARISON:  MRI 09/22/2020 FINDINGS: CTA NECK  FINDINGS Aortic arch: Visualized portions demonstrate atherosclerosis without significant stenosis or aneurysm. Right carotid system: No evidence of dissection, stenosis (50% or greater) or occlusion. Left carotid system: No evidence of dissection, stenosis (50% or greater) or occlusion. Vertebral arteries: Right dominant. There is abrupt occlusion of the left vertebral artery at the level of C2. Reconstitution immediately before the vertebral artery courses intradural E, which may be from retrograde flow and/or collaterals. Skeleton: Multilevel severe degenerative disc disease in  the cervical spine. Other neck: No mass lesion or adenopathy. Upper chest: Peripheral predominant ground-glass opacities in the imaged lung apices, compatible with COVID pneumonia. Review of the MIP images confirms the above findings CTA HEAD FINDINGS Anterior circulation: No significant stenosis, proximal occlusion, aneurysm, or vascular malformation. Posterior circulation: Abrupt inclusion of the distal right P2 PCA (series 5, image 448). a Venous sinuses: As permitted by contrast timing, patent. Additional comments: Partially imaged edema associated with multifocal posterior circulation infarcts that were better characterized on recent MRI. Review of the MIP images confirms the above findings IMPRESSION: 1. 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 2. Partially imaged edema associated with multifocal posterior circulation infarcts that were better characterized on recent MRI. Non-contrast head CT could further evaluate the degree of edema/mass effect if clinically indicated. 3. Peripheral predominant ground-glass opacities in the imaged lung apices, compatible with COVID pneumonia given the clinical history. These results will be called to the ordering clinician or representative by the Radiologist Assistant, and communication documented in the PACS or [employer].  Electronically Signed   By: Margaretha Sheffield MD   On: 09/24/2020 09:45   CT ANGIO NECK W OR WO CONTRAST  Result Date: 09/24/2020 CLINICAL DATA:  Posterior circulation stroke.  COVID positive. EXAM: CT ANGIOGRAPHY HEAD AND NECK TECHNIQUE: Multidetector CT imaging of the head and neck was performed using the standard protocol during bolus administration of intravenous contrast. Multiplanar CT image reconstructions and MIPs were obtained to evaluate the vascular anatomy. Carotid stenosis measurements (when applicable) are obtained utilizing NASCET criteria, using the distal internal carotid diameter as the denominator. CONTRAST:  52mL OMNIPAQUE IOHEXOL 350 MG/ML SOLN COMPARISON:  MRI 09/22/2020 FINDINGS: CTA NECK FINDINGS Aortic arch: Visualized portions demonstrate atherosclerosis without significant stenosis or aneurysm. Right carotid system: No evidence of dissection, stenosis (50% or greater) or occlusion. Left carotid system: No evidence of dissection, stenosis (50% or greater) or occlusion. Vertebral arteries: Right dominant. There is abrupt occlusion of the left vertebral artery at the level of C2. Reconstitution immediately before the vertebral artery courses intradural E, which may be from retrograde flow and/or collaterals. Skeleton: Multilevel severe degenerative disc disease in the cervical spine. Other neck: No mass lesion or adenopathy. Upper chest: Peripheral predominant ground-glass opacities in the imaged lung apices, compatible with COVID pneumonia. Review of the MIP images confirms the above findings CTA HEAD FINDINGS Anterior circulation: No significant stenosis, proximal occlusion, aneurysm, or vascular malformation. Posterior circulation: Abrupt inclusion of the distal right P2 PCA (series 5, image 448). a Venous sinuses: As permitted by contrast timing, patent. Additional comments: Partially imaged edema associated with multifocal posterior circulation infarcts that were better characterized  on recent MRI. Review of the MIP images confirms the above findings IMPRESSION: 1. 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 2. Partially imaged edema associated with multifocal posterior circulation infarcts that were better characterized on recent MRI. Non-contrast head CT could further evaluate the degree of edema/mass effect if clinically indicated. 3. Peripheral predominant ground-glass opacities in the imaged lung apices, compatible with COVID pneumonia given the clinical history. These results will be called to the ordering clinician or representative by the Radiologist Assistant, and communication documented in the PACS or [employer]. Electronically Signed   By: Margaretha Sheffield MD   On: 09/24/2020 09:45   MR BRAIN WO CONTRAST  Result  Date: 09/22/2020 CLINICAL DATA:  Delirium.  COVID positive EXAM: MRI HEAD WITHOUT CONTRAST TECHNIQUE: Multiplanar, multiecho pulse sequences of the brain and surrounding structures were obtained without intravenous contrast. COMPARISON:  None. FINDINGS: Brain: Extensive acute infarction in the posterior circulation, patchily present throughout the left more than right cerebellum with right cerebellar involvement mainly at the superior cerebellar distribution and left-sided involvement both at the PICA and superior cerebellar arteries. Right more than left PCA involvement involving the bilateral thalamus and right more than left occipital cortex. Right more than left hippocampal region involvement. No anterior circulation infarct is seen. No hemorrhage, hydrocephalus, or masslike finding. Mild chronic small vessel disease. Cerebral volume loss. Vascular: A right vertebral and basilar flow void or visible. The left vertebral artery could be hypoplastic or diseased. Skull and upper cervical spine: Normal marrow signal. Cervical spine degeneration with C3-4 and C4-5 anterolisthesis Sinuses/Orbits: Bilateral  cataract resection. Nasal septal perforation. Other: Progressively motion degraded study ASAP these results will be called to the ordering clinician or representative by the Radiologist Assistant, and communication documented in the PACS or [employer]. 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 Electronically Signed   By: Monte Fantasia M.D.   On: 09/22/2020 11:45   DG Chest Portable 1 View  Result Date: 09/12/2020 CLINICAL DATA:  Cough, congestion, fever, loss of taste and smell for 1 week EXAM: PORTABLE CHEST 1 VIEW COMPARISON:  01/11/2014 FINDINGS: Single frontal view of the chest demonstrates stable enlarged cardiac silhouette. There is diffuse increased interstitial prominence with bilateral ground-glass consolidation. No effusion or pneumothorax. No acute bony abnormalities. Bilateral shoulder arthroplasties. IMPRESSION: 1. Diffuse interstitial and ground-glass opacities, consistent with bilateral atypical pneumonia given clinical history. Electronically Signed   By: Randa Ngo M.D.   On: 09/05/2020 23:33   ECHOCARDIOGRAM COMPLETE  Result Date: 09/23/2020    ECHOCARDIOGRAM REPORT   Patient Name:   SATONYA LUX Date of Exam: 09/23/2020 Medical Rec #:  [id number]       Height:       64.0 in Accession #:    [id number]      Weight:       145.9 lb Date of Birth:  [birth year]       BSA:          10.711 m Patient Age:    88 years        BP:           139/82 mmHg Patient Gender: F               HR:           84 bpm. Exam Location:  Inpatient Procedure: 2D Echo, Cardiac Doppler and Color Doppler Indications:    Stroke 434.91 / I163.9  History:        Patient has no prior history of Echocardiogram examinations.                 Signs/Symptoms:Murmur; Risk Factors:Dyslipidemia. COVID-19                 Positive. CKD. GERD. Hypothyroidism.  Sonographer:    Jonelle Sidle Dance Referring Phys: [id number] [place]  1. Left ventricular ejection fraction, by  estimation, is 65 to 70%. The left ventricle has normal function. The left ventricle has no regional wall motion abnormalities. There is mild concentric left ventricular hypertrophy. Left ventricular diastolic parameters are consistent with Grade I diastolic dysfunction (impaired relaxation).  2. Right ventricular systolic function is  normal. The right ventricular size is normal.  3. Left atrial size was mildly dilated.  4. The mitral valve is normal in structure. Mild mitral valve regurgitation. No evidence of mitral stenosis. Moderate mitral annular calcification.  5. The aortic valve is normal in structure. Aortic valve regurgitation is not visualized. No aortic stenosis is present.  6. The inferior vena cava is normal in size with greater than 50% respiratory variability, suggesting right atrial pressure of 3 mmHg. Comparison(s): No prior Echocardiogram. Conclusion(s)/Recommendation(s): No intracardiac source of embolism detected on this transthoracic study. A transesophageal echocardiogram is recommended to exclude cardiac source of embolism if clinically indicated. FINDINGS  Left Ventricle: Left ventricular ejection fraction, by estimation, is 65 to 70%. The left ventricle has normal function. The left ventricle has no regional wall motion abnormalities. The left ventricular internal cavity size was normal in size. There is  mild concentric left ventricular hypertrophy. Left ventricular diastolic parameters are consistent with Grade I diastolic dysfunction (impaired relaxation). Normal left ventricular filling pressure. Right Ventricle: The right ventricular size is normal. No increase in right ventricular wall thickness. Right ventricular systolic function is normal. Left Atrium: Left atrial size was mildly dilated. Right Atrium: Right atrial size was normal in size. Pericardium: There is no evidence of pericardial effusion. Mitral Valve: The mitral valve is normal in structure. There is mild thickening of  the mitral valve leaflet(s). There is mild calcification of the mitral valve leaflet(s). Moderate mitral annular calcification. Mild mitral valve regurgitation. No evidence of mitral valve stenosis. Tricuspid Valve: The tricuspid valve is normal in structure. Tricuspid valve regurgitation is mild . No evidence of tricuspid stenosis. Aortic Valve: The aortic valve is normal in structure. Aortic valve regurgitation is not visualized. No aortic stenosis is present. Pulmonic Valve: The pulmonic valve was normal in structure. Pulmonic valve regurgitation is not visualized. No evidence of pulmonic stenosis. Aorta: The aortic root is normal in size and structure. Venous: The inferior vena cava is normal in size with greater than 50% respiratory variability, suggesting right atrial pressure of 3 mmHg. IAS/Shunts: No atrial level shunt detected by color flow Doppler.  LEFT VENTRICLE PLAX 2D LVIDd:         3.80 cm  Diastology LVIDs:         2.40 cm  LV e' lateral:   9.46 cm/s LV PW:         1.10 cm  LV E/e' lateral: 8.7 LV IVS:        1.10 cm LVOT diam:     2.10 cm LV SV:         105 LV SV Index:   61 LVOT Area:     3.46 cm  RIGHT VENTRICLE             IVC RV Basal diam:  2.00 cm     IVC diam: 1.20 cm RV S prime:     13.90 cm/s TAPSE (M-mode): 1.5 cm LEFT ATRIUM             Index       RIGHT ATRIUM          Index LA diam:        4.00 cm 2.34 cm/m  RA Area:     8.10 cm LA Vol (A2C):   48.1 ml 28.11 ml/m RA Volume:   13.00 ml 7.60 ml/m LA Vol (A4C):   34.5 ml 20.16 ml/m LA Biplane Vol: 42.3 ml 24.72 ml/m  AORTIC VALVE LVOT Vmax:  148.00 cm/s LVOT Vmean:  98.300 cm/s LVOT VTI:    0.302 m  AORTA Ao Root diam: 3.20 cm Ao Asc diam:  3.10 cm MITRAL VALVE MV Area (PHT): 2.53 cm     SHUNTS MV Decel Time: 300 msec     Systemic VTI:  0.30 m MV E velocity: 81.90 cm/s   Systemic Diam: 2.10 cm MV A velocity: 133.00 cm/s MV E/A ratio:  0.62 Ena Dawley MD Electronically signed by Ena Dawley MD Signature Date/Time:  09/23/2020/5:19:20 PM    Final

## 2020-09-25 NOTE — Progress Notes (Addendum)
Occupational Therapy Re- eval Patient Details Name: April Brewer MRN: [id number] DOB: November 11, 1946 Today's Date: 09/25/2020    History of present illness Pt is 74 y.o. female with medical history significant of hyperlipidemia, hypothyroidism, GERD, lymphocytic colitis, anxiety, and depression presented with complaints of shortness of breath and cough over the last 2 - 3 days. Noted associated symptoms of fever, loss of taste/smell, poor appetite, nausea, generalized weakness, and diarrhea. COVID-19 screening was positive. Pt admitted with COVID 19 PNE resp failure and on high flow oxygen.  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   OT comments  Pt seen for OT re-evaluation following findings of CVA in posterior circulation distribution. Pt presents with dysarthia, ataxia, increased weakness, and cognitive deficits from last OT session. Pt able to complete bed mobility with mod A and sit <> stand transfers with mod A and RW for support. Pt presents with posterior lean and ataxia with transfers, appearing to have overall poor proprioception/kinesthesia. Pt completed BSC transfer for BM, requiring total A for peri care. She then required mod A to transfer back to recliner. Upon assessment noted poor proprioception in BUEs, ataxia/dysmetria, and generalized weakness. No significant unilateral deficits noted. Pt has cognitive deficits as described below. She is very motivated to work toward [employer]. Updated recs to CIR for continued progression of ADL, safety, and prior level of independence. Will continue to follow.  O2 requirements: Pt on 4L HFNC on OT arrival with O2 sats stable. With [facility] transfer, pt dropping to 76-78% and not recovering. Pt bumped to 6L HFNC with minimal recovery, then 8L HFNC with ~80-82%. Pt  ultimately needing 10 HFNC to maintain sats throughout session. Was able to be titrated back down at end of session.    CIR;Supervision/Assistance - 24 hour    Equipment Recommendations  3 in 1 bedside commode    Recommendations for Other Services      Precautions / Restrictions Precautions Precautions: Fall Precaution Comments: watch sats Restrictions Weight Bearing Restrictions: No       Mobility Bed Mobility Overal bed mobility: Needs Assistance Bed Mobility: Supine to Sit     Supine to sit: Mod assist     General bed mobility comments: max multimodal cues to sequence transfer. Pt able to get legs partially off of bed, but ultimately needing mod A to attain upright sitting  Transfers Overall transfer level: Needs assistance Equipment used: Rolling walker (2 wheeled);None Transfers: Sit to/from [facility] Sit to Stand: Mod assist Stand pivot transfers: Mod assist       General transfer comment: cues for hand placement and for anterior weight shift to manage posterior lean. Pt improved with RW in appropriate upright posture. Mod steadying assist needed for pivotal steps to chair/BSC    Balance Overall balance assessment: Needs assistance Sitting-balance support: Feet supported;Bilateral upper extremity supported Sitting balance-Leahy Scale: Poor Sitting balance - Comments: Requiring bil UE for balance   Standing balance support: Bilateral upper extremity supported Standing balance-Leahy Scale: Poor Standing balance comment: Requiring mod A from therapist                           ADL either performed or assessed with clinical judgement   ADL Overall ADL's : Needs assistance/impaired  Toilet Transfer: Maximal assistance;Stand-pivot;BSC;Moderate assistance Armed forces technical officer Details (indicate cue type and reason): pt initially requiring max A to manage posterior lean. This improved with use of RW as cue  for appropriate posture. Pt was able to stand with mod A and BUE support on RW while OT completed peri care Toileting- Clothing Manipulation and Hygiene: Total assistance;Sit to/from stand Toileting - Clothing Manipulation Details (indicate cue type and reason): pt required total A due to needing UE support to maintain balance     Functional mobility during ADLs: Moderate assistance;Rolling walker General ADL Comments: pt completed BSC transfer, then transfer to chair. Originally on 4L HFNC- ultimately needing 10L HFNC to sustain O2     Vision   Additional Comments: will need to continue to assess   Perception     Praxis      Cognition Arousal/Alertness: Awake/alert Behavior During Therapy: WFL for tasks assessed/performed Overall Cognitive Status: Impaired/Different from baseline Area of Impairment: Orientation;Attention;Memory;Safety/judgement;Problem solving;Following commands;Awareness                 Orientation Level: Disoriented to;Time;Situation (Stated it was november, when prompted to subtract two months she stated "January") Current Attention Level: Sustained Memory: Decreased short-term memory Following Commands: Follows one step commands consistently;Follows multi-step commands inconsistently Safety/Judgement: Decreased awareness of safety;Decreased awareness of deficits Awareness: Emergent (aware she has to go to bathroom, but needs cues to begin implementing task) Problem Solving: Slow processing;Decreased initiation;Requires verbal cues;Difficulty sequencing General Comments: continues to need cues for orientation, unaware that she has had a CVA. Requires increased time and cueing to process basic mobility/ADL tasks        Exercises     Shoulder Instructions       General Comments      Pertinent Vitals/ Pain       Pain Assessment: No/denies pain  Home Living                                          Prior Functioning/Environment               Frequency  Min 3X/week        Progress Toward Goals  OT Goals(current goals can now be found in the care plan section)  Progress towards OT goals: Progressing toward goals  Acute Rehab OT Goals Patient Stated Goal: not stated OT Goal Formulation: With patient Time For Goal Achievement: 09/30/20 Potential to Achieve Goals: Good  Plan Discharge plan remains appropriate    Co-evaluation                 AM-PAC OT "6 Clicks" Daily Activity     Outcome Measure   Help from another person eating meals?: A Little Help from another person taking care of personal grooming?: A Little Help from another person toileting, which includes using toliet, bedpan, or urinal?: A Lot Help from another person bathing (including washing, rinsing, drying)?: A Lot Help from another person to put on and taking off regular upper body clothing?: A Lot Help from another person to put on and taking off regular lower body clothing?: A Lot 6 Click Score: 14    End of Session Equipment Utilized During Treatment: Gait belt;Rolling walker;Oxygen  OT Visit Diagnosis: Unsteadiness on feet (R26.81);Other abnormalities of gait and mobility (R26.89);Muscle weakness (generalized) (M62.81);Other symptoms and signs involving cognitive function   Activity Tolerance Patient tolerated treatment well  Patient Left in chair;with call bell/phone within reach   Nurse Communication Mobility status;Other (comment) (new purewick)        Time: 1010-1051 OT Time Calculation (min): 41 min  Charges: OT General Charges $OT Visit: 1 Visit OT Evaluation $OT Re-eval: 1 Re-eval OT Treatments $Self Care/Home Management : 23-37 mins  Zenovia Jarred, MSOT, OTR/L [facility] Office Number: [phone] Pager: [phone]  Zenovia Jarred 09/25/2020, 12:58 PM

## 2020-09-25 NOTE — Progress Notes (Signed)
Physical Therapy Treatment Patient Details Name: April Brewer MRN: [id number] DOB: [birth year] Today's Date: 09/25/2020    History of Present Illness Pt is 74 y.o. female with medical history significant of hyperlipidemia, hypothyroidism, GERD, lymphocytic colitis, anxiety, and depression presented with complaints of shortness of breath and cough over the last 2 - 3 days. Noted associated symptoms of fever, loss of taste/smell, poor appetite, nausea, generalized weakness, and diarrhea. COVID-19 screening was positive. Pt admitted with COVID 19 PNE resp failure and on high flow oxygen.  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    PT Comments    Patient slowly progressing as quickly fatigued this session with seated therex and one sit to stand.  She was able to recover while on 5L O2 with desaturation to 86% during activity.  She wants water, but coughs each time she gets a teaspoon even with cues for chin tuck.  She may progress to tolerate CIR.  PT to continue to follow.    Follow Up Recommendations  CIR     Equipment Recommendations  None recommended by PT    Recommendations for Other Services       Precautions / Restrictions Precautions Precautions: Fall Precaution Comments: watch sats    Mobility  Bed Mobility               General bed mobility comments: up in chair  Transfers Overall transfer level: Needs assistance Equipment used: Rolling walker (2 wheeled) Transfers: Sit to/from Stand Sit to Stand: Mod assist         General transfer comment: cues for technique, lifting assist from recliner, to sit assist to lower  Ambulation/Gait             General Gait Details: NT, too fatigued   Stairs             Wheelchair Mobility    Modified Rankin  (Stroke Patients Only) Modified Rankin (Stroke Patients Only) Pre-Morbid Rankin Score: Moderately severe disability Modified Rankin: Severe disability     Balance Overall balance assessment: Needs assistance   Sitting balance-Leahy Scale: Poor Sitting balance - Comments: UE support seated on recliner on edge of chair   Standing balance support: Bilateral upper extremity supported Standing balance-Leahy Scale: Poor Standing balance comment: min A for balance with UE support; quickly fatigues                            Cognition Arousal/Alertness: Awake/alert Behavior During Therapy: Flat affect Overall Cognitive Status: Impaired/Different from baseline Area of Impairment: Orientation;Attention;Memory;Safety/judgement;Problem solving;Following commands;Awareness                 Orientation Level: Disoriented to;Time;Situation Current Attention Level: Sustained Memory: Decreased short-term memory Following Commands: Follows one step commands consistently;Follows multi-step commands inconsistently;Follows one step commands with increased time Safety/Judgement: Decreased awareness of deficits   Problem Solving: Slow processing;Decreased initiation;Requires verbal cues;Requires tactile cues General Comments: Stated could not drink water because the doctor said, needed cues to realize she may aspirate      Exercises General Exercises - Lower Extremity Long Arc Quad: AROM;10 reps;Seated Other Exercises Other Exercises: Ant post leaning in chair for trunk x 3 Other Exercises: use of flutter valve for bringing up secretions, but limited and not effective  this session    General Comments General comments (skin integrity, edema, etc.): on 5L O2 92% at rest, down to 86% with seated and standing activity, HR up to 120's, recovered in 2 minutes HR 89 and SpO2 91%      Pertinent Vitals/Pain Pain Assessment: No/denies pain    Home Living                       Prior Function            PT Goals (current goals can now be found in the care plan section) Progress towards PT goals: Progressing toward goals (slowly)    Frequency    Min 4X/week      PT Plan Current plan remains appropriate    Co-evaluation              AM-PAC PT "6 Clicks" Mobility   Outcome Measure  Help needed turning from your back to your side while in a flat bed without using bedrails?: A Lot Help needed moving from lying on your back to sitting on the side of a flat bed without using bedrails?: A Lot Help needed moving to and from a bed to a chair (including a wheelchair)?: A Lot Help needed standing up from a chair using your arms (e.g., wheelchair or bedside chair)?: A Lot Help needed to walk in hospital room?: Total Help needed climbing 3-5 steps with a railing? : Total 6 Click Score: 10    End of Session Equipment Utilized During Treatment: Oxygen Activity Tolerance: Patient limited by fatigue Patient left: in chair;with call bell/phone within reach   PT Visit Diagnosis: Muscle weakness (generalized) (M62.81);Difficulty in walking, not elsewhere classified (R26.2);Ataxic gait (R26.0);Other symptoms and signs involving the nervous system (R29.898)     Time: 1572-6203 PT Time Calculation (min) (ACUTE ONLY): 19 min  Charges:  $Therapeutic Exercise: 8-22 mins                     Magda Kiel, PT Acute Rehabilitation Services Pager:[phone] [phone] 09/25/2020    Reginia Naas 09/25/2020, 8:03 PM

## 2020-09-25 NOTE — Progress Notes (Signed)
Inpatient Rehab Admissions Coordinator Note:   Patients are eligible to be considered for admit to the [facility] when cleared from airborne precautions by Acute MD, or Infectious Disease MD.  Otherwise, they will need to be >20 days (10/8) from their positive test with recovery/improvement in symptoms or 2 negative tests.    Please contact me, or place an IP Rehab MD Consult order if pt meets any of the above criteria.    Shann Medal, PT, DPT [phone] 09/25/20 10:55 AM

## 2020-09-25 NOTE — Progress Notes (Signed)
  Speech Language Pathology Patient Details Name: KEYAUNA GRAEFE MRN: [id number] DOB: [birth year] Today's Date: 09/25/2020 Time:  -      Plan is for MBS to be completed 9/30 afternoon (due to Covid). Continue ice chips/tsp water.              Houston Siren 09/25/2020, 11:45 AM   Orbie Pyo Colvin Caroli.Ed Risk analyst [phone] Office [phone]

## 2020-09-26 ENCOUNTER — Inpatient Hospital Stay (HOSPITAL_COMMUNITY): Payer: Medicare HMO

## 2020-09-26 DIAGNOSIS — J9601 Acute respiratory failure with hypoxia: Secondary | ICD-10-CM | POA: Diagnosis not present

## 2020-09-26 DIAGNOSIS — J1282 Pneumonia due to Coronavirus disease 2019: Secondary | ICD-10-CM | POA: Diagnosis not present

## 2020-09-26 DIAGNOSIS — R7401 Elevation of levels of liver transaminase levels: Secondary | ICD-10-CM | POA: Diagnosis not present

## 2020-09-26 DIAGNOSIS — U071 COVID-19: Secondary | ICD-10-CM | POA: Diagnosis not present

## 2020-09-26 LAB — CBC
HCT: 37.4 % (ref 36.0–46.0)
Hemoglobin: 12.1 g/dL (ref 12.0–15.0)
MCH: 30.8 pg (ref 26.0–34.0)
MCHC: 32.4 g/dL (ref 30.0–36.0)
MCV: 95.2 fL (ref 80.0–100.0)
Platelets: 404 10*3/uL — ABNORMAL HIGH (ref 150–400)
RBC: 3.93 MIL/uL (ref 3.87–5.11)
RDW: 15.7 % — ABNORMAL HIGH (ref 11.5–15.5)
WBC: 13.5 10*3/uL — ABNORMAL HIGH (ref 4.0–10.5)
nRBC: 0 % (ref 0.0–0.2)

## 2020-09-26 LAB — C-REACTIVE PROTEIN: CRP: 6 mg/dL — ABNORMAL HIGH (ref <1.0)

## 2020-09-26 LAB — COMPREHENSIVE METABOLIC PANEL
ALT: 28 U/L (ref 0–44)
AST: 30 U/L (ref 15–41)
Albumin: 2.6 g/dL — ABNORMAL LOW (ref 3.5–5.0)
Alkaline Phosphatase: 72 U/L (ref 38–126)
Anion gap: 13 (ref 5–15)
BUN: 22 mg/dL (ref 8–23)
CO2: 22 mmol/L (ref 22–32)
Calcium: 8.9 mg/dL (ref 8.9–10.3)
Chloride: 117 mmol/L — ABNORMAL HIGH (ref 98–111)
Creatinine, Ser: 0.83 mg/dL (ref 0.44–1.00)
GFR calc Af Amer: >60 mL/min (ref >60)
GFR calc non Af Amer: >60 mL/min (ref >60)
Glucose, Bld: 115 mg/dL — ABNORMAL HIGH (ref 70–99)
Potassium: 3.4 mmol/L — ABNORMAL LOW (ref 3.5–5.1)
Sodium: 152 mmol/L — ABNORMAL HIGH (ref 135–145)
Total Bilirubin: 1.1 mg/dL (ref 0.3–1.2)
Total Protein: 6.2 g/dL — ABNORMAL LOW (ref 6.5–8.1)

## 2020-09-26 LAB — D-DIMER, QUANTITATIVE: D-Dimer, Quant: 1.21 ug/mL-FEU — ABNORMAL HIGH (ref 0.00–0.50)

## 2020-09-26 MED ORDER — POTASSIUM CHLORIDE 10 MEQ/100ML IV SOLN
10.0000 meq | INTRAVENOUS | Status: AC
  Administered 2020-09-26 (×2): 10 meq via INTRAVENOUS
  Filled 2020-09-26 (×2): qty 100

## 2020-09-26 MED ORDER — METHYLPREDNISOLONE SODIUM SUCC 40 MG IJ SOLR
20.0000 mg | Freq: Two times a day (BID) | INTRAMUSCULAR | Status: DC
  Administered 2020-09-26 – 2020-09-28 (×4): 20 mg via INTRAVENOUS
  Filled 2020-09-26 (×4): qty 1

## 2020-09-26 MED ORDER — DEXTROSE 5 % IV SOLN: INTRAVENOUS | Status: DC

## 2020-09-26 NOTE — Progress Notes (Addendum)
PROGRESS NOTE                                                                                                                                                                                                             Patient Demographics:    April Brewer, is a 74 y.o. female, DOB - June 23, 1946, RXV:[id number]  Outpatient Primary MD for the patient is McGowen, Adrian Blackwater, MD   Admit date - 09/23/2020   LOS - 12  Chief Complaint  Patient presents with  . Shortness of Breath       Brief Narrative: Patient is a 74 y.o. female with PMHx of HLD, hypothyroidism, GERD, lymphocytic colitis, anxiety/depression, chronic back pain on narcotics-presented with several days history of shortness of breath-found to have acute hypoxic respiratory failure secondary to COVID-19 pneumonia.  Patient was admitted to the hospitalist service-started on COVID-19 treatment-unfortunately further hospital course complicated by acute CVA.  See below for further details.    COVID-19 vaccinated status: Unvaccinated  Significant Events: 9/18>> Admit to [facility] for severe hypoxia due to COVID-19 pneumonia 9/26>> worsening lethargy-MRI brain +ve for acute CVA  Significant studies: 9/17>>Chest x-ray: Diffuse interstitial and groundglass opacities 9/26>> MRI brain: Multifocal acute infarcts in the posterior circulation. 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  Down to 3-4 L of oxygen.   Assessment  & Plan :   Acute Hypoxic Resp Failure due to Covid 19 Viral pneumonia: Had severe hypoxemia on initial presentation requiring 15 L of HFNC-gradually improving-down to 5 L.  Has completed a course of remdesivir-on tapering doses of Solu-Medrol.  Was on baricitinib-but not given as patient is n.p.o. due to CVA related dysphagia.    Fever: afebrile O2 requirements:  SpO2: 97 % O2 Flow Rate (L/min): 5 L/min   COVID-19 Labs: Recent Labs    09/24/20 0109 09/26/20 1021  DDIMER 1.91* 1.21*  CRP 8.5* 6.0*       Component Value Date/Time   BNP 173.5 (H) 09/22/2020 0418    Recent Labs  Lab 09/22/20 0418 09/23/20 0500 09/24/20 0743  PROCALCITON <0.10 <0.10 <0.10    Lab Results  Component Value Date   SARSCOV2NAA POSITIVE (A) 09/11/2020   SARSCOV2NAA NOT DETECTED 06/06/2019     Prone/Incentive Spirometry: encouraged patient to lie prone for 3-4 hours at a time for a total of 16 hours a day, and to encourage incentive spirometry use 3-4/hour.  Acute ischemic CVA: Felt to be due to hypercoagulable state in the setting of COVID-19-telemetry without any arrhythmias-echo without any obvious embolic source.  Currently n.p.o. and on aspirin suppository-neurology recommends once diet established-to transition to aspirin/Plavix for 3 months followed by aspirin alone.  Dysphagia: Secondary to CVA-spoke with SLP-MBS scheduled for 9/30.  Addendum: Per speech therapy-not yet ready to transition to oral intake per MBS findings-we will go and place consult for Cortak tube.  Hypernatremia: Secondary to poor oral intake/being n.p.o. following CVA-change from normal saline to D5W-we will recheck electrolytes tomorrow.  Awaiting SLP evaluation to see if we can start oral intake.  Hypokalemia: Replete and recheck  Transaminitis: Improved-secondary to COVID-19.  HLD: Resume statin once oral intake is  resumed.  Hypothyroidism: Continue Synthroid-change back to p.o. route when oral intake established.  Anxiety/depression: Relatively stable-Xanax/lamotrigine/Zoloft unable to be given due to mental status changes/n.p.o. status-placed on as needed IV Ativan as patient has been on chronic benzos as an outpatient.  Chronic back pain: Unable to give oxycodone is n.p.o.-start as needed IV morphine-patient on chronic narcotics at home.  Goals of care discussion: Continue full scope of treatment- short of intubation.  Given her tenuous situation-after extensive discussion with son/patient-we all agree that if patient were to deteriorate significantly-we will not escalate care to include intubation/mechanical ventilation-DNR order placed on 9/20  GI prophylaxis:PPI  ABG:    Component Value Date/Time   PHART 7.464 (H) 09/20/2020 2359   PCO2ART 40.8 09/20/2020 2359   PO2ART 68.2 (L) 09/20/2020 2359   HCO3 28.9 (H) 09/20/2020 2359   TCO2 22 02/03/2015 0326   O2SAT 93.9 09/20/2020 2359    Vent Settings: N/A  Consults: Neurology  Condition - Extremely Guarded  Family Communication  :  Son [name] [phone]) called on 9/30-mailbox full-unable to leave a voicemail.  Code Status :   DNR  Diet :  Diet Order    None       Disposition Plan  :   Status is: Inpatient  Remains inpatient appropriate because:Inpatient level of care appropriate due to severity of illness   Dispo: The patient is from: Home              Anticipated d/c is to: SNF versus CIR.              Anticipated d/c date is: > 3 days              Patient currently is not medically stable to d/c.    Antimicorbials  :    Anti-infectives (From admission, onward)   Start     Dose/Rate Route Frequency Ordered Stop   09/14/20 2200  cefTRIAXone (ROCEPHIN) 2 g in sodium chloride 0.9 % 100 mL IVPB  Status:  Discontinued        2 g 200 mL/hr over 30 Minutes Intravenous Every 24 hours 09/14/20 2043 09/15/20 1102    09/14/20 2200  azithromycin (ZITHROMAX)  500 mg in sodium chloride 0.9 % 250 mL IVPB  Status:  Discontinued        500 mg 250 mL/hr over 60 Minutes Intravenous Every 24 hours 09/14/20 2043 09/15/20 1102   09/14/20 1000  remdesivir 100 mg in sodium chloride 0.9 % 100 mL IVPB       "Followed by" Linked Group Details   100 mg 200 mL/hr over 30 Minutes Intravenous Daily 09/22/2020 2306 09/17/20 0907   09/25/2020 2330  remdesivir 100 mg in sodium chloride 0.9 % 100 mL IVPB       "Followed by" Linked Group Details   100 mg 200 mL/hr over 30 Minutes Intravenous Every 30 min 09/08/2020 2306 09/14/20 0033      Inpatient Medications  Scheduled Meds: . acidophilus  1 capsule Oral Daily  . albuterol  2 puff Inhalation Q6H  . vitamin C  500 mg Oral Daily  . aspirin  300 mg Rectal Daily  . atorvastatin  10 mg Oral Daily  . enoxaparin (LOVENOX) injection  40 mg Subcutaneous Q24H  . feeding supplement (ENSURE ENLIVE)  237 mL Oral BID BM  . influenza vaccine adjuvanted  0.5 mL Intramuscular Tomorrow-1000  . lamoTRIgine  100 mg Oral Daily  . levothyroxine  40 mcg Intravenous Daily  . mouth rinse  15 mL Mouth Rinse BID  . methylPREDNISolone (SOLU-MEDROL) injection  40 mg Intravenous Q12H  . pantoprazole (PROTONIX) IV  40 mg Intravenous Q24H  . sertraline  100 mg Oral BID  . sodium chloride flush  10-40 mL Intracatheter Q12H  . sodium chloride flush  3 mL Intravenous Q12H  . zinc sulfate  220 mg Oral Daily   Continuous Infusions: . sodium chloride Stopped (09/15/20 0004)  . sodium chloride Stopped (09/14/20 2233)  . sodium chloride 50 mL/hr at 09/26/20 0803   PRN Meds:.sodium chloride, sodium chloride, acetaminophen, chlorpheniramine-HYDROcodone, guaiFENesin-dextromethorphan, LORazepam, morphine injection, ondansetron **OR** ondansetron (ZOFRAN) IV, phenol, sodium chloride flush    See all Orders from today for further details   Oren Binet M.D on 09/26/2020 at 2:59 PM  To page go to  [email or web address] - use universal password  [facility] -  Office  [phone]    Objective:   Vitals:   09/25/20 2058 09/26/20 0018 09/26/20 0415 09/26/20 0831  BP: (!) 142/100 (!) 148/105 (!) 146/91 (!) 149/92  Pulse: (!) 106 (!) 104 95   Resp: 20 18 (!) 21 18  Temp: 97.6 F (36.4 C) 97.9 F (36.6 C) 97.8 F (36.6 C) 98.1 F (36.7 C)  TempSrc: Oral Tympanic Axillary Axillary  SpO2: 95% 98% 97%   Weight:      Height:        Wt Readings from Last 3 Encounters:  09/12/2020 66.2 kg  09/22/20 66.2 kg  09/04/20 69.2 kg     Intake/Output Summary (Last 24 hours) at 09/26/2020 1459 Last data filed at 09/25/2020 1500 Gross per 24 hour  Intake 894.12 ml  Output --  Net 894.12 ml     Physical Exam  Gen Exam:Alert awake-not in any distress-remains dysarthric. HEENT:atraumatic, normocephalic Chest: B/L clear to auscultation anteriorly CVS:S1S2 regular Abdomen:soft non tender, non distended Extremities:no edema Neurology: Moving all 4 extremities.   Skin: no rash    Data Review:    CBC Recent Labs  Lab 09/21/20 0304 09/22/20 0418 09/23/20 0500 09/24/20 0109 09/26/20 1021  WBC 11.7* 14.8* 10.3 16.1* 13.5*  HGB 11.1* 11.0* 10.6* 11.6* 12.1  HCT 33.0* 34.0* 33.0* 35.4* 37.4  PLT  389 434* 381 430* 404*  MCV 88.5 92.1 92.2 93.4 95.2  MCH 29.8 29.8 29.6 30.6 30.8  MCHC 33.6 32.4 32.1 32.8 32.4  RDW 14.3 14.6 14.9 15.3 15.7*    Chemistries  Recent Labs  Lab 09/21/20 0145 09/22/20 0418 09/23/20 0500 09/24/20 0109 09/26/20 1021  NA 129* 138 141 143 152*  K 5.1 4.2 4.3 4.3 3.4*  CL 93* 103 108 110 117*  CO2 23 24 24  19* 22  GLUCOSE 127* 110* 129* 103* 115*  BUN 22 22 23 21 22   CREATININE 0.95 1.02* 0.96 0.84 0.83  CALCIUM 8.5* 8.9 9.0 9.2 8.9  AST 61* 44* 42* 42* 30  ALT 34 38 32 31 28  ALKPHOS 101 97 87 86 72  BILITOT 1.2 1.0 0.8 1.4* 1.1    ------------------------------------------------------------------------------------------------------------------ No results for input(s): CHOL, HDL, LDLCALC, TRIG, CHOLHDL, LDLDIRECT in the last 72 hours.  Lab Results  Component Value Date   HGBA1C 6.3 (H) 09/22/2020   ------------------------------------------------------------------------------------------------------------------ No results for input(s): TSH, T4TOTAL, T3FREE, THYROIDAB in the last 72 hours.  Invalid input(s): FREET3 ------------------------------------------------------------------------------------------------------------------ No results for input(s): VITAMINB12, FOLATE, FERRITIN, TIBC, IRON, RETICCTPCT in the last 72 hours.  Coagulation profile No results for input(s): INR, PROTIME in the last 168 hours.  Recent Labs    09/24/20 0109 09/26/20 1021  DDIMER 1.91* 1.21*    Cardiac Enzymes No results for input(s): CKMB, TROPONINI, MYOGLOBIN in the last 168 hours.  Invalid input(s): CK ------------------------------------------------------------------------------------------------------------------    Component Value Date/Time   BNP 173.5 (H) 09/22/2020 3299    Micro Results No results found for this or any previous visit (from the past 240 hour(s)).  Radiology Reports CT ANGIO HEAD W OR WO CONTRAST  Result Date: 09/24/2020 CLINICAL DATA:  Posterior circulation stroke.  COVID positive. EXAM: CT ANGIOGRAPHY HEAD AND NECK TECHNIQUE: Multidetector CT imaging of the head and neck was performed using the standard protocol during bolus administration of intravenous contrast. Multiplanar CT image reconstructions and MIPs were obtained to evaluate the vascular anatomy. Carotid stenosis measurements (when applicable) are obtained utilizing NASCET criteria, using the distal internal carotid diameter as the denominator. CONTRAST:  53mL OMNIPAQUE IOHEXOL 350 MG/ML SOLN COMPARISON:  MRI 09/22/2020 FINDINGS: CTA NECK  FINDINGS Aortic arch: Visualized portions demonstrate atherosclerosis without significant stenosis or aneurysm. Right carotid system: No evidence of dissection, stenosis (50% or greater) or occlusion. Left carotid system: No evidence of dissection, stenosis (50% or greater) or occlusion. Vertebral arteries: Right dominant. There is abrupt occlusion of the left vertebral artery at the level of C2. Reconstitution immediately before the vertebral artery courses intradural E, which may be from retrograde flow and/or collaterals. Skeleton: Multilevel severe degenerative disc disease in the cervical spine. Other neck: No mass lesion or adenopathy. Upper chest: Peripheral predominant ground-glass opacities in the imaged lung apices, compatible with COVID pneumonia. Review of the MIP images confirms the above findings CTA HEAD FINDINGS Anterior circulation: No significant stenosis, proximal occlusion, aneurysm, or vascular malformation. Posterior circulation: Abrupt inclusion of the distal right P2 PCA (series 5, image 448). a Venous sinuses: As permitted by contrast timing, patent. Additional comments: Partially imaged edema associated with multifocal posterior circulation infarcts that were better characterized on recent MRI. Review of the MIP images confirms the above findings IMPRESSION: 1. 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 2. Partially imaged edema associated with multifocal posterior circulation infarcts that were better characterized on recent  MRI. Non-contrast head CT could further evaluate the degree of edema/mass effect if clinically indicated. 3. Peripheral predominant ground-glass opacities in the imaged lung apices, compatible with COVID pneumonia given the clinical history. These results will be called to the ordering clinician or representative by the Radiologist Assistant, and communication documented in the PACS or [employer].  Electronically Signed   By: Margaretha Sheffield MD   On: 09/24/2020 09:45   CT ANGIO NECK W OR WO CONTRAST  Result Date: 09/24/2020 CLINICAL DATA:  Posterior circulation stroke.  COVID positive. EXAM: CT ANGIOGRAPHY HEAD AND NECK TECHNIQUE: Multidetector CT imaging of the head and neck was performed using the standard protocol during bolus administration of intravenous contrast. Multiplanar CT image reconstructions and MIPs were obtained to evaluate the vascular anatomy. Carotid stenosis measurements (when applicable) are obtained utilizing NASCET criteria, using the distal internal carotid diameter as the denominator. CONTRAST:  19mL OMNIPAQUE IOHEXOL 350 MG/ML SOLN COMPARISON:  MRI 09/22/2020 FINDINGS: CTA NECK FINDINGS Aortic arch: Visualized portions demonstrate atherosclerosis without significant stenosis or aneurysm. Right carotid system: No evidence of dissection, stenosis (50% or greater) or occlusion. Left carotid system: No evidence of dissection, stenosis (50% or greater) or occlusion. Vertebral arteries: Right dominant. There is abrupt occlusion of the left vertebral artery at the level of C2. Reconstitution immediately before the vertebral artery courses intradural E, which may be from retrograde flow and/or collaterals. Skeleton: Multilevel severe degenerative disc disease in the cervical spine. Other neck: No mass lesion or adenopathy. Upper chest: Peripheral predominant ground-glass opacities in the imaged lung apices, compatible with COVID pneumonia. Review of the MIP images confirms the above findings CTA HEAD FINDINGS Anterior circulation: No significant stenosis, proximal occlusion, aneurysm, or vascular malformation. Posterior circulation: Abrupt inclusion of the distal right P2 PCA (series 5, image 448). a Venous sinuses: As permitted by contrast timing, patent. Additional comments: Partially imaged edema associated with multifocal posterior circulation infarcts that were better characterized  on recent MRI. Review of the MIP images confirms the above findings IMPRESSION: 1. 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 2. Partially imaged edema associated with multifocal posterior circulation infarcts that were better characterized on recent MRI. Non-contrast head CT could further evaluate the degree of edema/mass effect if clinically indicated. 3. Peripheral predominant ground-glass opacities in the imaged lung apices, compatible with COVID pneumonia given the clinical history. These results will be called to the ordering clinician or representative by the Radiologist Assistant, and communication documented in the PACS or [employer]. Electronically Signed   By: Margaretha Sheffield MD   On: 09/24/2020 09:45   MR BRAIN WO CONTRAST  Result Date: 09/22/2020 CLINICAL DATA:  Delirium.  COVID positive EXAM: MRI HEAD WITHOUT CONTRAST TECHNIQUE: Multiplanar, multiecho pulse sequences of the brain and surrounding structures were obtained without intravenous contrast. COMPARISON:  None. FINDINGS: Brain: Extensive acute infarction in the posterior circulation, patchily present throughout the left more than right cerebellum with right cerebellar involvement mainly at the superior cerebellar distribution and left-sided involvement both at the PICA and superior cerebellar arteries. Right more than left PCA involvement involving the bilateral thalamus and right more than left occipital cortex. Right more than left hippocampal region involvement. No anterior circulation infarct is seen. No hemorrhage, hydrocephalus, or masslike finding. Mild chronic small vessel disease. Cerebral volume loss. Vascular: A right vertebral and basilar flow void or visible. The left vertebral artery could be hypoplastic or diseased. Skull  and upper cervical spine: Normal marrow signal. Cervical spine degeneration with C3-4 and C4-5 anterolisthesis Sinuses/Orbits: Bilateral  cataract resection. Nasal septal perforation. Other: Progressively motion degraded study ASAP these results will be called to the ordering clinician or representative by the Radiologist Assistant, and communication documented in the PACS or [employer]. 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 Electronically Signed   By: Monte Fantasia M.D.   On: 09/22/2020 11:45   DG Chest Portable 1 View  Result Date: 09/06/2020 CLINICAL DATA:  Cough, congestion, fever, loss of taste and smell for 1 week EXAM: PORTABLE CHEST 1 VIEW COMPARISON:  01/11/2014 FINDINGS: Single frontal view of the chest demonstrates stable enlarged cardiac silhouette. There is diffuse increased interstitial prominence with bilateral ground-glass consolidation. No effusion or pneumothorax. No acute bony abnormalities. Bilateral shoulder arthroplasties. IMPRESSION: 1. Diffuse interstitial and ground-glass opacities, consistent with bilateral atypical pneumonia given clinical history. Electronically Signed   By: Randa Ngo M.D.   On: 09/23/2020 23:33   ECHOCARDIOGRAM COMPLETE  Result Date: 09/23/2020    ECHOCARDIOGRAM REPORT   Patient Name:   April Brewer Date of Exam: 09/23/2020 Medical Rec #:  [id number]       Height:       64.0 in Accession #:    [id number]      Weight:       145.9 lb Date of Birth:  1946/12/26       BSA:          69.711 m Patient Age:    29 years        BP:           139/82 mmHg Patient Gender: F               HR:           84 bpm. Exam Location:  Inpatient Procedure: 2D Echo, Cardiac Doppler and Color Doppler Indications:    Stroke 434.91 / I163.9  History:        Patient has no prior history of Echocardiogram examinations.                 Signs/Symptoms:Murmur; Risk Factors:Dyslipidemia. COVID-19                 Positive. CKD. GERD. Hypothyroidism.  Sonographer:    Jonelle Sidle Dance Referring Phys: [id number] [place]  1. Left ventricular ejection fraction, by  estimation, is 65 to 70%. The left ventricle has normal function. The left ventricle has no regional wall motion abnormalities. There is mild concentric left ventricular hypertrophy. Left ventricular diastolic parameters are consistent with Grade I diastolic dysfunction (impaired relaxation).  2. Right ventricular systolic function is normal. The right ventricular size is normal.  3. Left atrial size was mildly dilated.  4. The mitral valve is normal in structure. Mild mitral valve regurgitation. No evidence of mitral stenosis. Moderate mitral annular calcification.  5. The aortic valve is normal in structure. Aortic valve regurgitation is not visualized. No aortic stenosis is present.  6. The inferior vena cava is normal in size with greater than 50% respiratory variability, suggesting right atrial pressure of 3 mmHg. Comparison(s): No prior Echocardiogram. Conclusion(s)/Recommendation(s): No intracardiac source of embolism detected on this transthoracic study. A transesophageal echocardiogram is recommended to exclude cardiac source of embolism if clinically indicated. FINDINGS  Left Ventricle: Left ventricular ejection fraction, by estimation, is 65 to 70%. The left ventricle has normal function. The left ventricle has no regional wall  motion abnormalities. The left ventricular internal cavity size was normal in size. There is  mild concentric left ventricular hypertrophy. Left ventricular diastolic parameters are consistent with Grade I diastolic dysfunction (impaired relaxation). Normal left ventricular filling pressure. Right Ventricle: The right ventricular size is normal. No increase in right ventricular wall thickness. Right ventricular systolic function is normal. Left Atrium: Left atrial size was mildly dilated. Right Atrium: Right atrial size was normal in size. Pericardium: There is no evidence of pericardial effusion. Mitral Valve: The mitral valve is normal in structure. There is mild thickening of  the mitral valve leaflet(s). There is mild calcification of the mitral valve leaflet(s). Moderate mitral annular calcification. Mild mitral valve regurgitation. No evidence of mitral valve stenosis. Tricuspid Valve: The tricuspid valve is normal in structure. Tricuspid valve regurgitation is mild . No evidence of tricuspid stenosis. Aortic Valve: The aortic valve is normal in structure. Aortic valve regurgitation is not visualized. No aortic stenosis is present. Pulmonic Valve: The pulmonic valve was normal in structure. Pulmonic valve regurgitation is not visualized. No evidence of pulmonic stenosis. Aorta: The aortic root is normal in size and structure. Venous: The inferior vena cava is normal in size with greater than 50% respiratory variability, suggesting right atrial pressure of 3 mmHg. IAS/Shunts: No atrial level shunt detected by color flow Doppler.  LEFT VENTRICLE PLAX 2D LVIDd:         3.80 cm  Diastology LVIDs:         2.40 cm  LV e' lateral:   9.46 cm/s LV PW:         1.10 cm  LV E/e' lateral: 8.7 LV IVS:        1.10 cm LVOT diam:     2.10 cm LV SV:         105 LV SV Index:   61 LVOT Area:     3.46 cm  RIGHT VENTRICLE             IVC RV Basal diam:  2.00 cm     IVC diam: 1.20 cm RV S prime:     13.90 cm/s TAPSE (M-mode): 1.5 cm LEFT ATRIUM             Index       RIGHT ATRIUM          Index LA diam:        4.00 cm 2.34 cm/m  RA Area:     8.10 cm LA Vol (A2C):   48.1 ml 28.11 ml/m RA Volume:   13.00 ml 7.60 ml/m LA Vol (A4C):   34.5 ml 20.16 ml/m LA Biplane Vol: 42.3 ml 24.72 ml/m  AORTIC VALVE LVOT Vmax:   148.00 cm/s LVOT Vmean:  98.300 cm/s LVOT VTI:    0.302 m  AORTA Ao Root diam: 3.20 cm Ao Asc diam:  3.10 cm MITRAL VALVE MV Area (PHT): 2.53 cm     SHUNTS MV Decel Time: 300 msec     Systemic VTI:  0.30 m MV E velocity: 81.90 cm/s   Systemic Diam: 2.10 cm MV A velocity: 133.00 cm/s MV E/A ratio:  0.62 Ena Dawley MD Electronically signed by Ena Dawley MD Signature Date/Time:  09/23/2020/5:19:20 PM    Final

## 2020-09-26 NOTE — Progress Notes (Signed)
Modified Barium Swallow Progress Note  Patient Details  Name: April Brewer MRN: [id number] Date of Birth: 05-09-1946  Today's Date: 09/26/2020  Modified Barium Swallow completed.  Full report located under Chart Review in the Imaging Section.  Brief recommendations include the following:  Clinical Impression  Severe oropharyngeal dysphagia marked by oral holding, lingual residue, inefficient posterior propulsion, late swallow initiation, decreased pharyngeal contraction. Initially able to propel nectar and first trial of puree but subsequent attempts were unsuccessful. Pt held head in an extended postion (diagnosed with cervical spinal degeneration with C3-4 and C4-5 anterolisthesis; dry spoon presentations and verbal cues were not effective. Barium reached her pyriform sinuses for several minutes and was eventually suctioned. Swallow initiated late therefore nectar thick barium dumped to her small pyriform sinus cavities and immediately fell into trachea with immediate cough. Residue in valleculae was max with puree. Recommend continued NPO and ST in attempts to return to po's. Continue oral care.       Swallow Evaluation Recommendations       SLP Diet Recommendations: NPO       Medication Administration: Via alternative means               Oral Care Recommendations: Oral care QID        April Brewer 09/26/2020,4:49 PM  Orbie Pyo [place].Ed Risk analyst [phone] Office [phone]

## 2020-09-26 NOTE — Progress Notes (Signed)
  Speech Language Pathology Treatment:    Patient Details Name: CHARLIENE INOUE MRN: [id number] DOB: [birth year] Today's Date: 09/26/2020 Time:  -     Pt's MBS is scheduled for today at 2:00             [place] Siren 09/26/2020, 8:12 AM  Orbie Pyo [place].Ed Actor Secure chat if needed or cell [phone] for today Office [phone]  '

## 2020-09-27 DIAGNOSIS — J9601 Acute respiratory failure with hypoxia: Secondary | ICD-10-CM | POA: Diagnosis not present

## 2020-09-27 DIAGNOSIS — J1282 Pneumonia due to Coronavirus disease 2019: Secondary | ICD-10-CM | POA: Diagnosis not present

## 2020-09-27 DIAGNOSIS — U071 COVID-19: Secondary | ICD-10-CM | POA: Diagnosis not present

## 2020-09-27 LAB — CBC
HCT: 39 % (ref 36.0–46.0)
Hemoglobin: 12.3 g/dL (ref 12.0–15.0)
MCH: 29.9 pg (ref 26.0–34.0)
MCHC: 31.5 g/dL (ref 30.0–36.0)
MCV: 94.9 fL (ref 80.0–100.0)
Platelets: 380 10*3/uL (ref 150–400)
RBC: 4.11 MIL/uL (ref 3.87–5.11)
RDW: 15.6 % — ABNORMAL HIGH (ref 11.5–15.5)
WBC: 14.3 10*3/uL — ABNORMAL HIGH (ref 4.0–10.5)
nRBC: 0 % (ref 0.0–0.2)

## 2020-09-27 LAB — COMPREHENSIVE METABOLIC PANEL
ALT: 27 U/L (ref 0–44)
AST: 33 U/L (ref 15–41)
Albumin: 2.7 g/dL — ABNORMAL LOW (ref 3.5–5.0)
Alkaline Phosphatase: 77 U/L (ref 38–126)
Anion gap: 13 (ref 5–15)
BUN: 18 mg/dL (ref 8–23)
CO2: 25 mmol/L (ref 22–32)
Calcium: 9.2 mg/dL (ref 8.9–10.3)
Chloride: 111 mmol/L (ref 98–111)
Creatinine, Ser: 0.85 mg/dL (ref 0.44–1.00)
GFR calc Af Amer: >60 mL/min (ref >60)
GFR calc non Af Amer: >60 mL/min (ref >60)
Glucose, Bld: 128 mg/dL — ABNORMAL HIGH (ref 70–99)
Potassium: 3.8 mmol/L (ref 3.5–5.1)
Sodium: 149 mmol/L — ABNORMAL HIGH (ref 135–145)
Total Bilirubin: 1.2 mg/dL (ref 0.3–1.2)
Total Protein: 6.6 g/dL (ref 6.5–8.1)

## 2020-09-27 LAB — D-DIMER, QUANTITATIVE: D-Dimer, Quant: 0.81 ug/mL-FEU — ABNORMAL HIGH (ref 0.00–0.50)

## 2020-09-27 LAB — C-REACTIVE PROTEIN: CRP: 4.1 mg/dL — ABNORMAL HIGH (ref <1.0)

## 2020-09-27 MED ORDER — POTASSIUM CHLORIDE 20 MEQ/15ML (10%) PO SOLN
40.0000 meq | Freq: Once | ORAL | Status: AC
  Administered 2020-09-27: 40 meq
  Filled 2020-09-27: qty 30

## 2020-09-27 MED ORDER — OSMOLITE 1.5 CAL PO LIQD
1000.0000 mL | ORAL | Status: DC
  Administered 2020-09-27 – 2020-09-30 (×3): 1000 mL
  Administered 2020-10-01: 474 mL
  Administered 2020-10-02 – 2020-10-03 (×2): 1000 mL
  Filled 2020-09-27 (×6): qty 1000

## 2020-09-27 MED ORDER — OSMOLITE 1.2 CAL PO LIQD
1000.0000 mL | ORAL | Status: DC
  Filled 2020-09-27: qty 1000

## 2020-09-27 MED ORDER — PROSOURCE TF PO LIQD
45.0000 mL | Freq: Two times a day (BID) | ORAL | Status: DC
  Administered 2020-09-28 – 2020-10-07 (×18): 45 mL
  Filled 2020-09-27 (×23): qty 45

## 2020-09-27 MED ORDER — METOPROLOL TARTRATE 5 MG/5ML IV SOLN
INTRAVENOUS | Status: AC
  Filled 2020-09-27: qty 5

## 2020-09-27 MED ORDER — METOPROLOL TARTRATE 5 MG/5ML IV SOLN
2.5000 mg | Freq: Three times a day (TID) | INTRAVENOUS | Status: DC
  Administered 2020-09-27 – 2020-09-28 (×3): 2.5 mg via INTRAVENOUS
  Filled 2020-09-27 (×2): qty 5

## 2020-09-27 MED ORDER — MAGNESIUM SULFATE 2 GM/50ML IV SOLN
2.0000 g | Freq: Once | INTRAVENOUS | Status: AC
  Administered 2020-09-27: 2 g via INTRAVENOUS
  Filled 2020-09-27: qty 50

## 2020-09-27 MED ORDER — FREE WATER
170.0000 mL | Status: DC
  Administered 2020-09-28 – 2020-09-29 (×8): 170 mL

## 2020-09-27 NOTE — Progress Notes (Signed)
Initial Nutrition Assessment  DOCUMENTATION CODES:   Not applicable  INTERVENTION:   -D/c Ensure Enlive po BID, each supplement provides 350 kcal and 20 grams of protein -Initiate Osmolite 1.5 @ 25 ml/hr via cortrak tube and increase by 10 ml every 4 hours to goal rate of 5 ml/hr.   45 ml Prosource TF BID.    170 ml free water flush every 4 hours  Tube feeding regimen provides 2060 kcal (100% of needs), 105 grams of protein, and 1006 ml of H2O. Total free water: 2026 ml/ day  NUTRITION DIAGNOSIS:   Increased nutrient needs related to acute illness (COVID-19) as evidenced by estimated needs.  GOAL:   Patient will meet greater than or equal to 90% of their needs  MONITOR:   Diet advancement, Labs, Weight trends, TF tolerance, Skin, I & O's  REASON FOR ASSESSMENT:   Consult Enteral/tube feeding initiation and management  ASSESSMENT:   April Brewer is a 74 y.o. female with medical history significant of hyperlipidemia, hypothyroidism, GERD, lymphocytic colitis, anxiety, and depression presented with complaints of shortness of breath and cough over the last 2 - 3 days.  She reports that her cough has been productive.  Noted associated symptoms of fever, loss of taste/smell, poor appetite, nausea, generalized weakness, and diarrhea.  She reports that she had not received any COVID-19 vaccines due to concerns that it may not be safe.  Pt admitted with acute respiratory failure with hypoxia secondary to COVID-19 pneumonia.   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   Reviewed wt hx; wt has been stable over the past 3 months.  Medications reviewed and include vitamin C, zinc sulfate, and IV solu-medrol.   Albumin has a half-life of 21 days and is strongly affected by stress response and inflammatory process, therefore, do not expect to see an improvement in this lab value during acute hospitalization. When a patient presents with low albumin, it is likely skewed due to the acute inflammatory response. Note that low albumin is no longer used to diagnose malnutrition; [name] uses the new malnutrition guidelines published by the American Society for Parenteral and Enteral Nutrition (A.S.P.E.N.) and the Academy of Nutrition and Dietetics (AND).    Labs reviewed: Na: 152, K: 3.4.   Diet Order:   Diet Order    None      EDUCATION NEEDS:   No education needs have been identified at this time  Skin:  Skin Assessment: Reviewed RN Assessment  Last BM:  09/26/20  Height:   Ht Readings from Last 1 Encounters:  09/11/2020 5\' 4"  (1.626 m)    Weight:   Wt Readings from Last 1 Encounters:  09/21/2020 66.2 kg    Ideal Body Weight:  54.5 kg  BMI:  Body mass index is 25.06 kg/m.  Estimated Nutritional Needs:   Kcal:  2000-2200  Protein:  105-120 grams  Fluid:  > 2 L    April Brewer, RD, LDN,  CDCES Registered Dietitian II Certified Diabetes Care and Education Specialist Please refer to [facility] for RD and/or RD on-call/weekend/after hours pager

## 2020-09-27 NOTE — Progress Notes (Signed)
Received call from telemetry that patient had a short SVT run in the 150's. Went to check on patient and she was resting comfortably. MD paged. Will continue to monitor.

## 2020-09-27 NOTE — Progress Notes (Signed)
   09/27/20 1118  Assess: MEWS Score  Temp 98 F (36.7 C)  BP (!) 143/93  Resp (!) 30  SpO2 98 %  Assess: MEWS Score  MEWS Temp 0  MEWS Systolic 0  MEWS Pulse 0  MEWS RR 2  MEWS LOC 0  MEWS Score 2  MEWS Score Color Yellow  Assess: if the MEWS score is Yellow or Red  Were vital signs taken at a resting state? Yes  Focused Assessment No change from prior assessment  Early Detection of Sepsis Score *See Row Information* Medium  MEWS guidelines implemented *See Row Information* Yes  Treat  MEWS Interventions Escalated (See documentation below)  Take Vital Signs  Increase Vital Sign Frequency  Yellow: Q 2hr X 2 then Q 4hr X 2, if remains yellow, continue Q 4hrs  Escalate  MEWS: Escalate Yellow: discuss with charge nurse/RN and consider discussing with provider and RRT  Notify: Charge Nurse/RN  Name of Charge Nurse/RN Notified Kristin, RN  Date Charge Nurse/RN Notified 09/20/20  Time Charge Nurse/RN Notified 1220

## 2020-09-27 NOTE — Progress Notes (Signed)
Dr. [place] Pink returned called and said that he will put the pt. On a beta blocker.  Will watch for order.

## 2020-09-27 NOTE — Progress Notes (Addendum)
PROGRESS NOTE                                                                                                                                                                                                             Patient Demographics:    April Brewer, is a 74 y.o. female, DOB - September 11, 1946, YKD:[id number]  Outpatient Primary MD for the patient is McGowen, Adrian Blackwater, MD   Admit date - 08/29/2020   LOS - 58  Chief Complaint  Patient presents with  . Shortness of Breath       Brief Narrative: Patient is a 74 y.o. female with PMHx of HLD, hypothyroidism, GERD, lymphocytic colitis, anxiety/depression, chronic back pain on narcotics-presented with several days history of shortness of breath-found to have acute hypoxic respiratory failure secondary to COVID-19 pneumonia.  Patient was admitted to the hospitalist service-started on COVID-19 treatment-unfortunately further hospital course complicated by acute CVA.  See below for further details.    COVID-19 vaccinated status: Unvaccinated  Significant Events: 9/18>> Admit to [facility] for severe hypoxia due to COVID-19 pneumonia 9/26>> worsening lethargy-MRI brain +ve for acute CVA  Significant studies: 9/17>>Chest x-ray: Diffuse interstitial and groundglass opacities 9/26>> MRI brain: Multifocal acute infarcts in the posterior circulation. 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  However she denies any chest pain or shortness of breath.   Assessment  & Plan :   Acute Hypoxic Resp Failure due to Covid 19 Viral pneumonia Had severe hypoxemia on initial presentation requiring 15 L of HFNC Patient has been gradually improving.  She is now down to 5 L of oxygen by nasal cannula.  Noted to be saturating in the with nursing staff.  We will continue to wean down oxygen.  Patient has completed course of Remdesivir.  Remains on tapering doses of steroids.  Continue with  incentive spirometry mobilization.  Patient was also on baricitinib.  Had to be held due to dysphagia from her acute CVA.  COVID-19 Labs: Recent Labs    09/26/20 1021 09/27/20 0604  DDIMER 1.21* 0.81*  CRP 6.0* 4.1*    Acute ischemic CVA Felt to be due to hypercoagulable state in the setting of COVID-19-telemetry without any arrhythmias-echo without any obvious embolic source.   Remains n.p.o. due to high aspiration risk.   Once patient is able to take orally she will need to be on aspirin and Plavix for 3 months followed by aspirin alone.    Oropharyngeal dysphagia Did not do well with the modified barium swallow on 9/30.  She will need cortrak tube placement with initiation of tube feedings.  PSVT Noted on tele. Patient asymptomatic. Recent echo reviewed. TSH was normal, 1.86 on 8/30. Metoprolol. Replace potassium. Check Mg.  Hypernatremia Likely due to free water deficit from lack of oral intake.  Noted to be 149 today.  Hopefully once a feeding tube has been placed she can get free water down the tube with improvement in sodium levels.    Hypokalemia Noted to be normal today.  Transaminitis Improved-secondary to COVID-19.  HLD Resume statin once oral intake is resumed.  LDL noted to be 72.  Hypothyroidism Continue Synthroid-change back to p.o. route when oral intake established.  Anxiety/depression Seems to be stable.    Her medications including Xanax lamotrigine and Zoloft are currently on hold.    Chronic back pain Unable to give oxycodone is n.p.o.-start as needed IV morphine-patient on chronic narcotics at home.  Goals of care discussion: Continue full scope of treatment- short of intubation.  Given her tenuous situation-after extensive discussion with son/patient-we all agree that if patient were to deteriorate significantly-we will not escalate care to include intubation/mechanical ventilation-DNR order placed on 9/20  GI prophylaxis:PPI  Consults: Neurology  Family Communication  :  Son [name] [phone])  Code Status :   DNR  Diet :  Diet Order    None       Disposition Plan  :   Status is: Inpatient  Remains inpatient appropriate because:Inpatient level of care appropriate due to severity of illness   Dispo: The patient is from: Home              Anticipated d/c is to: SNF versus CIR.              Anticipated d/c date is: > 3 days              Patient currently is not medically stable to d/c.    Antimicorbials  :    Anti-infectives (From admission, onward)   Start     Dose/Rate Route Frequency Ordered Stop   09/14/20 2200  cefTRIAXone (ROCEPHIN) 2 g in sodium chloride 0.9 % 100 mL IVPB  Status:  Discontinued        2 g 200 mL/hr over 30 Minutes Intravenous Every 24 hours 09/14/20 2043 09/15/20 1102   09/14/20 2200  azithromycin (ZITHROMAX) 500 mg in sodium chloride 0.9 % 250 mL IVPB  Status:  Discontinued        500 mg 250 mL/hr over 60 Minutes Intravenous Every 24 hours 09/14/20 2043 09/15/20 1102   09/14/20 1000  remdesivir 100 mg in sodium chloride 0.9 % 100 mL IVPB       "Followed by" Linked Group Details   100 mg 200 mL/hr over 30 Minutes Intravenous Daily 09/22/2020 2306 09/17/20  9323   09/08/2020 2330  remdesivir 100 mg in sodium chloride 0.9 % 100 mL IVPB       "Followed by" Linked Group Details   100 mg 200 mL/hr over 30 Minutes Intravenous Every 30 min 08/31/2020  2306 09/14/20 0033      Inpatient Medications  Scheduled Meds: . acidophilus  1 capsule Oral Daily  . albuterol  2 puff Inhalation Q6H  . vitamin C  500 mg Oral Daily  . aspirin  300 mg Rectal Daily  . atorvastatin  10 mg Oral Daily  . enoxaparin (LOVENOX) injection  40 mg Subcutaneous Q24H  . feeding supplement (ENSURE ENLIVE)  237 mL Oral BID BM  . influenza vaccine adjuvanted  0.5 mL Intramuscular Tomorrow-1000  . lamoTRIgine  100 mg Oral Daily  . levothyroxine  40 mcg Intravenous Daily  . mouth rinse  15 mL Mouth Rinse BID  . methylPREDNISolone (SOLU-MEDROL) injection  20 mg Intravenous Q12H  . pantoprazole (PROTONIX) IV  40 mg Intravenous Q24H  . sertraline  100 mg Oral BID  . sodium chloride flush  10-40 mL Intracatheter Q12H  . sodium chloride flush  3 mL Intravenous Q12H  . zinc sulfate  220 mg Oral Daily   Continuous Infusions: . sodium chloride Stopped (09/15/20 0004)  . sodium chloride Stopped (09/14/20 2233)  . dextrose 50 mL/hr at 09/27/20 1258   PRN Meds:.sodium chloride, sodium chloride, acetaminophen, chlorpheniramine-HYDROcodone, guaiFENesin-dextromethorphan, LORazepam, morphine injection, ondansetron **OR** ondansetron (ZOFRAN) IV, phenol, sodium chloride flush    Bonnielee Haff M.D on 09/27/2020 at 1:26 PM  To page go to [email or web address] - use universal password  [facility] -  Office  [phone]    Objective:   Vitals:   09/26/20 2025 09/26/20 2329 09/27/20 0506 09/27/20 1118  BP: (!) 168/98 (!) 152/89 (!) 144/82 (!) 143/93  Pulse: 95 92 86   Resp: 20 19 20  (!) 30  Temp: 98 F (36.7 C) 97.6 F (36.4 C) 97.6 F (36.4 C) 98 F (36.7 C)  TempSrc: Axillary Oral Oral Axillary  SpO2: 92% 93% 96% 98%  Weight:      Height:        Wt Readings from Last 3 Encounters:  09/08/2020 66.2 kg  09/22/20 66.2 kg  09/04/20 69.2 kg     Intake/Output Summary (Last 24 hours) at 09/27/2020 1326 Last data filed at 09/27/2020 0545 Gross per 24 hour   Intake 607.08 ml  Output 500 ml  Net 107.08 ml     Physical Exam  General appearance: Awake alert.  In no distress.  Dysarthric Resp: Normal effort at rest.  Coarse breath sounds bilateral lungs.  Few crackles bilateral bases.  No wheezing or rhonchi.   Cardio: S1-S2 is normal regular.  No S3-S4.  No rubs murmurs or bruit GI: Abdomen is soft.  Nontender nondistended.  Bowel sounds are present normal.  No masses organomegaly      Data Review:    CBC Recent Labs  Lab 09/22/20 0418 09/23/20 0500 09/24/20 0109 09/26/20 1021 09/27/20 0604  WBC 14.8* 10.3 16.1* 13.5* 14.3*  HGB 11.0* 10.6* 11.6* 12.1 12.3  HCT 34.0* 33.0* 35.4* 37.4 39.0  PLT 434* 381 430* 404* 380  MCV 92.1 92.2 93.4 95.2 94.9  MCH 29.8 29.6 30.6 30.8 29.9  MCHC 32.4 32.1 32.8 32.4 31.5  RDW 14.6 14.9 15.3 15.7* 15.6*    Chemistries  Recent Labs  Lab 09/22/20 0418 09/23/20 0500 09/24/20 0109 09/26/20 1021 09/27/20 0604  NA 138 141  143 152* 149*  K 4.2 4.3 4.3 3.4* 3.8  CL 103 108 110 117* 111  CO2 24 24 19* 22 25  GLUCOSE 110* 129* 103* 115* 128*  BUN 22 23 21 22 18   CREATININE 1.02* 0.96 0.84 0.83 0.85  CALCIUM 8.9 9.0 9.2 8.9 9.2  AST 44* 42* 42* 30 33  ALT 38 32 31 28 27   ALKPHOS 97 87 86 72 77  BILITOT 1.0 0.8 1.4* 1.1 1.2    Micro Results No results found for this or any previous visit (from the past 240 hour(s)).  Radiology Reports CT ANGIO HEAD W OR WO CONTRAST  Result Date: 09/24/2020 CLINICAL DATA:  Posterior circulation stroke.  COVID positive. EXAM: CT ANGIOGRAPHY HEAD AND NECK TECHNIQUE: Multidetector CT imaging of the head and neck was performed using the standard protocol during bolus administration of intravenous contrast. Multiplanar CT image reconstructions and MIPs were obtained to evaluate the vascular anatomy. Carotid stenosis measurements (when applicable) are obtained utilizing NASCET criteria, using the distal internal carotid diameter as the denominator. CONTRAST:   44mL OMNIPAQUE IOHEXOL 350 MG/ML SOLN COMPARISON:  MRI 09/22/2020 FINDINGS: CTA NECK FINDINGS Aortic arch: Visualized portions demonstrate atherosclerosis without significant stenosis or aneurysm. Right carotid system: No evidence of dissection, stenosis (50% or greater) or occlusion. Left carotid system: No evidence of dissection, stenosis (50% or greater) or occlusion. Vertebral arteries: Right dominant. There is abrupt occlusion of the left vertebral artery at the level of C2. Reconstitution immediately before the vertebral artery courses intradural E, which may be from retrograde flow and/or collaterals. Skeleton: Multilevel severe degenerative disc disease in the cervical spine. Other neck: No mass lesion or adenopathy. Upper chest: Peripheral predominant ground-glass opacities in the imaged lung apices, compatible with COVID pneumonia. Review of the MIP images confirms the above findings CTA HEAD FINDINGS Anterior circulation: No significant stenosis, proximal occlusion, aneurysm, or vascular malformation. Posterior circulation: Abrupt inclusion of the distal right P2 PCA (series 5, image 448). a Venous sinuses: As permitted by contrast timing, patent. Additional comments: Partially imaged edema associated with multifocal posterior circulation infarcts that were better characterized on recent MRI. Review of the MIP images confirms the above findings IMPRESSION: 1. 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 2. Partially imaged edema associated with multifocal posterior circulation infarcts that were better characterized on recent MRI. Non-contrast head CT could further evaluate the degree of edema/mass effect if clinically indicated. 3. Peripheral predominant ground-glass opacities in the imaged lung apices, compatible with COVID pneumonia given the clinical history. These results will be called to the ordering clinician or representative by the  Radiologist Assistant, and communication documented in the PACS or [employer]. Electronically Signed   By: Margaretha Sheffield MD   On: 09/24/2020 09:45   CT ANGIO NECK W OR WO CONTRAST  Result Date: 09/24/2020 CLINICAL DATA:  Posterior circulation stroke.  COVID positive. EXAM: CT ANGIOGRAPHY HEAD AND NECK TECHNIQUE: Multidetector CT imaging of the head and neck was performed using the standard protocol during bolus administration of intravenous contrast. Multiplanar CT image reconstructions and MIPs were obtained to evaluate the vascular anatomy. Carotid stenosis measurements (when applicable) are obtained utilizing NASCET criteria, using the distal internal carotid diameter as the denominator. CONTRAST:  39mL OMNIPAQUE IOHEXOL 350 MG/ML SOLN COMPARISON:  MRI 09/22/2020 FINDINGS: CTA NECK FINDINGS Aortic arch: Visualized portions demonstrate atherosclerosis without significant stenosis or aneurysm. Right carotid system: No evidence of dissection,  stenosis (50% or greater) or occlusion. Left carotid system: No evidence of dissection, stenosis (50% or greater) or occlusion. Vertebral arteries: Right dominant. There is abrupt occlusion of the left vertebral artery at the level of C2. Reconstitution immediately before the vertebral artery courses intradural E, which may be from retrograde flow and/or collaterals. Skeleton: Multilevel severe degenerative disc disease in the cervical spine. Other neck: No mass lesion or adenopathy. Upper chest: Peripheral predominant ground-glass opacities in the imaged lung apices, compatible with COVID pneumonia. Review of the MIP images confirms the above findings CTA HEAD FINDINGS Anterior circulation: No significant stenosis, proximal occlusion, aneurysm, or vascular malformation. Posterior circulation: Abrupt inclusion of the distal right P2 PCA (series 5, image 448). a Venous sinuses: As permitted by contrast timing, patent. Additional comments: Partially imaged edema  associated with multifocal posterior circulation infarcts that were better characterized on recent MRI. Review of the MIP images confirms the above findings IMPRESSION: 1. 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 2. Partially imaged edema associated with multifocal posterior circulation infarcts that were better characterized on recent MRI. Non-contrast head CT could further evaluate the degree of edema/mass effect if clinically indicated. 3. Peripheral predominant ground-glass opacities in the imaged lung apices, compatible with COVID pneumonia given the clinical history. These results will be called to the ordering clinician or representative by the Radiologist Assistant, and communication documented in the PACS or [employer]. Electronically Signed   By: Margaretha Sheffield MD   On: 09/24/2020 09:45   MR BRAIN WO CONTRAST  Result Date: 09/22/2020 CLINICAL DATA:  Delirium.  COVID positive EXAM: MRI HEAD WITHOUT CONTRAST TECHNIQUE: Multiplanar, multiecho pulse sequences of the brain and surrounding structures were obtained without intravenous contrast. COMPARISON:  None. FINDINGS: Brain: Extensive acute infarction in the posterior circulation, patchily present throughout the left more than right cerebellum with right cerebellar involvement mainly at the superior cerebellar distribution and left-sided involvement both at the PICA and superior cerebellar arteries. Right more than left PCA involvement involving the bilateral thalamus and right more than left occipital cortex. Right more than left hippocampal region involvement. No anterior circulation infarct is seen. No hemorrhage, hydrocephalus, or masslike finding. Mild chronic small vessel disease. Cerebral volume loss. Vascular: A right vertebral and basilar flow void or visible. The left vertebral artery could be hypoplastic or diseased. Skull and upper cervical spine: Normal marrow signal.  Cervical spine degeneration with C3-4 and C4-5 anterolisthesis Sinuses/Orbits: Bilateral cataract resection. Nasal septal perforation. Other: Progressively motion degraded study ASAP these results will be called to the ordering clinician or representative by the Radiologist Assistant, and communication documented in the PACS or [employer]. 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 Electronically Signed   By: Monte Fantasia M.D.   On: 09/22/2020 11:45   DG Chest Portable 1 View  Result Date: 09/07/2020 CLINICAL DATA:  Cough, congestion, fever, loss of taste and smell for 1 week EXAM: PORTABLE CHEST 1 VIEW COMPARISON:  01/11/2014 FINDINGS: Single frontal view of the chest demonstrates stable enlarged cardiac silhouette. There is diffuse increased interstitial prominence with bilateral ground-glass consolidation. No effusion or pneumothorax. No acute bony abnormalities. Bilateral shoulder arthroplasties. IMPRESSION: 1. Diffuse interstitial and ground-glass opacities, consistent with bilateral atypical pneumonia given clinical history. Electronically Signed   By: Randa Ngo M.D.   On: 09/16/2020 23:33   DG Swallowing Func-Speech Pathology  Result Date: 09/26/2020 Objective Swallowing Evaluation: Type of  Study: MBS-Modified Barium Swallow Study  Patient Details Name: MAELIN KURKOWSKI MRN: [id number] Date of Birth: June 22, 1946 Today's Date: 09/26/2020 Time: SLP Start Time (ACUTE ONLY): 1412 -SLP Stop Time (ACUTE ONLY): 1429 SLP Time Calculation (min) (ACUTE ONLY): 17 min Past Medical History: Past Medical History: Diagnosis Date . ALLERGIC RHINITIS 06/02/2007 . Allergy  . Arthritis   DDD of cervical, thoracic, and lumbar spine . Blood transfusion without reported diagnosis  . Cataract   Bilateral removed cateracts . Cervical spondylosis   C5-6 and C6-7.  Pain mgmt as per Dr. Nelva Bush. . Chronic gastritis without bleeding  . Chronic low back pain 02/18/2009  s/p fusion L3-4  through L5-S1.  Pain meds per Dr. Nelva Bush . Chronic renal insufficiency, stage 3 (moderate)   borderline II/III (GFR about 55 ml/min) as of 2021 . DEPRESSION 06/02/2007 . Diverticulosis  . GERD (gastroesophageal reflux disease)  . Heart murmur  . Hiatal hernia  . Hypercholesterolemia  . HYPOTHYROIDISM 06/02/2007  GOITER . IBS (irritable bowel syndrome)  . INSOMNIA 06/02/2007 . Lymphocytic colitis  . Muscle spasms of lower extremity  . Nasal septal perforation   chronic; hx of epistaxis . Normocytic anemia 04/2015  HEME + in ED 08/07/15--endoscopic eval unrevealing.  Iron studies fine-->?Anemia of chronic dz (lymphocytic colitis?).  Hb 02/2020 11.5. . OSTEOPENIA 02/18/2009  Repeat DEXA 07/2014 showed osteoporosis by T score in radius but spine and hip T scores were normal-continue vit D and calcium and repeat DEXA 08/2016 unchanged.  Repeat DEXA 2 yrs. . Peripheral edema   LE's, nonpitting . Renal cyst, acquired, right   1.2 cm, 2021 . Simple hepatic cyst   x 1. 2021 . Trochanteric bursitis of both hips   inj 08/11/20 Past Surgical History: Past Surgical History: Procedure Laterality Date . ABDOMINAL HYSTERECTOMY    for DUB (ovaries are still in) . BACK SURGERY    X 5: fusion of L3-L4 through L5-S1 . CATARACT EXTRACTION Bilateral  . CHOLECYSTECTOMY  2012 . COLONOSCOPY  06/2007; 01/2015  2008 Diverticulosis, o/w normal.  2016 showed lymphocytic colitis with mild diverticulosis: pt was started on oral budesonide at that time. . CT SCAN  09/03/2017  ABDOMEN, HEAD . DEXA  08/16/2014; 09/14/16  Hip and spine ok; radius osteoporotic---but meds not indicated.  Repeat 08/2018 . ESOPHAGOGASTRODUODENOSCOPY  10/2007; 11/27/15; 04/26/20  03/2020: chronic gastritis, H pylori neg, celiac neg.  +Barretts.  Hyperplastic gastric polyp. Marland Kitchen HIP CLOSED REDUCTION Right 09/20/2018  Procedure: CLOSED REDUCTION HIP;  Surgeon: Paralee Cancel, MD;  Location: [facility];  Service: Orthopedics;  Laterality: Right; . REVERSE SHOULDER ARTHROPLASTY Left 06/09/2019   Procedure: REVERSE SHOULDER ARTHROPLASTY;  Surgeon: Netta Cedars, MD;  Location: [facility];  Service: Orthopedics;  Laterality: Left; . SHOULDER SURGERY Right   \ . TOTAL HIP ARTHROPLASTY  05/31/2012  Procedure: TOTAL HIP ARTHROPLASTY ANTERIOR APPROACH;  Surgeon: Mauri Pole, MD;  Location: [facility];  Service: Orthopedics;  Laterality: Left; . TOTAL HIP ARTHROPLASTY Right 08/30/2018  Procedure: RIGHT TOTAL HIP ARTHROPLASTY ANTERIOR APPROACH;  Surgeon: Paralee Cancel, MD;  Location: [facility];  Service: Orthopedics;  Laterality: Right;  70 mins HPI: Patient is a 74 y.o. female with PMHx of HLD, hypothyroidism, GERD, lymphocytic colitis, anxiety/depression, chronic back pain on narcotics-presented with several days history of shortness of breath-found to have acute hypoxic respiratory failure secondary to COVID-19 pneumonia.  Patient started to develop some lethargy 9/24, progressive, with no improvement despite holding Xanax and oxycodone, MRI brain significant for acute CVA 9/26 .desat to 83 on  oxygen. 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  Swallow eval ordered.  Subjective: pt awake in bed Assessment / Plan / Recommendation [facility] IP CLINICAL IMPRESSIONS 09/26/2020 Clinical Impression Severe oropharyngeal dysphagia marked by oral holding, lingual residue, inefficient posterior propulsion, late swallow initiation, decreased pharyngeal contraction. Initially able to propel nectar and first trial of puree but subsequent attempts were unsuccessful. Pt held head in an extended postion (diagnosed with cervical spinal degeneration with C3-4 and C4-5 anterolisthesis; dry spoon presentations and verbal cues were not effective. Barium reached her pyriform sinuses for several minutes and was eventually suctioned. Swallow initiated late therefore nectar thick barium dumped to her small pyriform sinus cavities and immediately fell into trachea with  immediate cough. Residue in valleculae was max with puree. Recommend continued NPO and ST in attempts to return to po's. Continue oral care.     SLP Visit Diagnosis Dysphagia, oropharyngeal phase (R13.12) Attention and concentration deficit following -- Frontal lobe and executive function deficit following -- Impact on safety and function Severe aspiration risk   CH[facility] IP TREATMENT RECOMMENDATION 09/26/2020 Treatment Recommendations Therapy as outlined in treatment plan below   Prognosis 09/26/2020 Prognosis for Safe Diet Advancement Fair Barriers to Reach Goals Severity of deficits Barriers/Prognosis Comment -- [facility] IP DIET RECOMMENDATION 09/26/2020 SLP Diet Recommendations NPO Liquid Administration via -- Medication Administration Via alternative means Compensations -- Postural Changes --   [facility] IP OTHER RECOMMENDATIONS 09/26/2020 Recommended Consults -- Oral Care Recommendations Oral care QID Other Recommendations --   [facility] IP FOLLOW UP RECOMMENDATIONS 09/26/2020 Follow up Recommendations Skilled Nursing facility   [facility] IP FREQUENCY AND DURATION 09/26/2020 Speech Therapy Frequency (ACUTE ONLY) min 2x/week Treatment Duration 2 weeks      [facility] IP ORAL PHASE 09/26/2020 Oral Phase Impaired Oral - Pudding Teaspoon -- Oral - Pudding Cup -- Oral - Honey Teaspoon -- Oral - Honey Cup -- Oral - Nectar Teaspoon -- Oral - Nectar Cup Delayed oral transit;Decreased bolus cohesion;Holding of bolus;Reduced posterior propulsion;Lingual/palatal residue Oral - Nectar Straw -- Oral - Thin Teaspoon -- Oral - Thin Cup -- Oral - Thin Straw -- Oral - Puree Holding of bolus;Delayed oral transit;Weak lingual manipulation;Reduced posterior propulsion;Lingual/palatal residue Oral - Mech Soft -- Oral - Regular -- Oral - Multi-Consistency -- Oral - Pill -- Oral Phase - Comment --  [facility] IP PHARYNGEAL PHASE 09/26/2020 Pharyngeal Phase Impaired Pharyngeal- Pudding Teaspoon -- Pharyngeal -- Pharyngeal- Pudding Cup -- Pharyngeal -- Pharyngeal- Honey Teaspoon --  Pharyngeal -- Pharyngeal- Honey Cup -- Pharyngeal -- Pharyngeal- Nectar Teaspoon -- Pharyngeal -- Pharyngeal- Nectar Cup Delayed swallow initiation-pyriform sinuses;Pharyngeal residue - valleculae;Pharyngeal residue - pyriform Pharyngeal -- Pharyngeal- Nectar Straw -- Pharyngeal -- Pharyngeal- Thin Teaspoon -- Pharyngeal -- Pharyngeal- Thin Cup -- Pharyngeal -- Pharyngeal- Thin Straw -- Pharyngeal -- Pharyngeal- Puree Delayed swallow initiation-vallecula;Reduced pharyngeal peristalsis;Pharyngeal residue - pyriform;Reduced tongue base retraction Pharyngeal -- Pharyngeal- Mechanical Soft -- Pharyngeal -- Pharyngeal- Regular -- Pharyngeal -- Pharyngeal- Multi-consistency -- Pharyngeal -- Pharyngeal- Pill -- Pharyngeal -- Pharyngeal Comment --  [facility] IP CERVICAL ESOPHAGEAL PHASE 09/26/2020 Cervical Esophageal Phase WFL Pudding Teaspoon -- Pudding Cup -- Honey Teaspoon -- Honey Cup -- Nectar Teaspoon -- Nectar Cup -- Nectar Straw -- Thin Teaspoon -- Thin Cup -- Thin Straw -- Puree -- Mechanical Soft -- Regular -- Multi-consistency -- Pill -- Cervical Esophageal Comment -- [place] Siren 09/26/2020, 4:48 PM Orbie Pyo Colvin Caroli.Ed Risk analyst [phone] Office [phone]  ECHOCARDIOGRAM COMPLETE  Result Date: 09/23/2020    ECHOCARDIOGRAM REPORT   Patient Name:   JAILYNE CHIEFFO Date of Exam: 09/23/2020 Medical Rec #:  [id number]       Height:       64.0 in Accession #:    [id number]      Weight:       145.9 lb Date of Birth:  19-Jan-1946       BSA:          76.711 m Patient Age:    44 years        BP:           139/82 mmHg Patient Gender: F               HR:           84 bpm. Exam Location:  Inpatient Procedure: 2D Echo, Cardiac Doppler and Color Doppler Indications:    Stroke 434.91 / I163.9  History:        Patient has no prior history of Echocardiogram examinations.                 Signs/Symptoms:Murmur; Risk Factors:Dyslipidemia. COVID-19                 Positive. CKD. GERD.  Hypothyroidism.  Sonographer:    Jonelle Sidle Dance Referring Phys: [id number] Brandt  1. Left ventricular ejection fraction, by estimation, is 65 to 70%. The left ventricle has normal function. The left ventricle has no regional wall motion abnormalities. There is mild concentric left ventricular hypertrophy. Left ventricular diastolic parameters are consistent with Grade I diastolic dysfunction (impaired relaxation).  2. Right ventricular systolic function is normal. The right ventricular size is normal.  3. Left atrial size was mildly dilated.  4. The mitral valve is normal in structure. Mild mitral valve regurgitation. No evidence of mitral stenosis. Moderate mitral annular calcification.  5. The aortic valve is normal in structure. Aortic valve regurgitation is not visualized. No aortic stenosis is present.  6. The inferior vena cava is normal in size with greater than 50% respiratory variability, suggesting right atrial pressure of 3 mmHg. Comparison(s): No prior Echocardiogram. Conclusion(s)/Recommendation(s): No intracardiac source of embolism detected on this transthoracic study. A transesophageal echocardiogram is recommended to exclude cardiac source of embolism if clinically indicated. FINDINGS  Left Ventricle: Left ventricular ejection fraction, by estimation, is 65 to 70%. The left ventricle has normal function. The left ventricle has no regional wall motion abnormalities. The left ventricular internal cavity size was normal in size. There is  mild concentric left ventricular hypertrophy. Left ventricular diastolic parameters are consistent with Grade I diastolic dysfunction (impaired relaxation). Normal left ventricular filling pressure. Right Ventricle: The right ventricular size is normal. No increase in right ventricular wall thickness. Right ventricular systolic function is normal. Left Atrium: Left atrial size was mildly dilated. Right Atrium: Right atrial size was normal in size.  Pericardium: There is no evidence of pericardial effusion. Mitral Valve: The mitral valve is normal in structure. There is mild thickening of the mitral valve leaflet(s). There is mild calcification of the mitral valve leaflet(s). Moderate mitral annular calcification. Mild mitral valve regurgitation. No evidence of mitral valve stenosis. Tricuspid Valve: The tricuspid valve is normal in structure. Tricuspid valve regurgitation is mild . No evidence of tricuspid stenosis. Aortic Valve: The aortic valve is normal in structure. Aortic valve regurgitation is not visualized. No aortic stenosis is present. Pulmonic Valve: The pulmonic valve was normal in  structure. Pulmonic valve regurgitation is not visualized. No evidence of pulmonic stenosis. Aorta: The aortic root is normal in size and structure. Venous: The inferior vena cava is normal in size with greater than 50% respiratory variability, suggesting right atrial pressure of 3 mmHg. IAS/Shunts: No atrial level shunt detected by color flow Doppler.  LEFT VENTRICLE PLAX 2D LVIDd:         3.80 cm  Diastology LVIDs:         2.40 cm  LV e' lateral:   9.46 cm/s LV PW:         1.10 cm  LV E/e' lateral: 8.7 LV IVS:        1.10 cm LVOT diam:     2.10 cm LV SV:         105 LV SV Index:   61 LVOT Area:     3.46 cm  RIGHT VENTRICLE             IVC RV Basal diam:  2.00 cm     IVC diam: 1.20 cm RV S prime:     13.90 cm/s TAPSE (M-mode): 1.5 cm LEFT ATRIUM             Index       RIGHT ATRIUM          Index LA diam:        4.00 cm 2.34 cm/m  RA Area:     8.10 cm LA Vol (A2C):   48.1 ml 28.11 ml/m RA Volume:   13.00 ml 7.60 ml/m LA Vol (A4C):   34.5 ml 20.16 ml/m LA Biplane Vol: 42.3 ml 24.72 ml/m  AORTIC VALVE LVOT Vmax:   148.00 cm/s LVOT Vmean:  98.300 cm/s LVOT VTI:    0.302 m  AORTA Ao Root diam: 3.20 cm Ao Asc diam:  3.10 cm MITRAL VALVE MV Area (PHT): 2.53 cm     SHUNTS MV Decel Time: 300 msec     Systemic VTI:  0.30 m MV E velocity: 81.90 cm/s   Systemic Diam: 2.10  cm MV A velocity: 133.00 cm/s MV E/A ratio:  0.62 Ena Dawley MD Electronically signed by Ena Dawley MD Signature Date/Time: 09/23/2020/5:19:20 PM    Final

## 2020-09-27 NOTE — Progress Notes (Signed)
Physical Therapy Treatment Patient Details Name: April Brewer MRN: [id number] DOB: 1946/08/05 Today's Date: 09/27/2020    History of Present Illness Pt is 74 y.o. female with medical history significant of hyperlipidemia, hypothyroidism, GERD, lymphocytic colitis, anxiety, and depression presented with complaints of shortness of breath and cough over the last 2 - 3 days. Noted associated symptoms of fever, loss of taste/smell, poor appetite, nausea, generalized weakness, and diarrhea. COVID-19 screening was positive. Pt admitted with COVID 19 PNE resp failure and on high flow oxygen.  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    PT Comments    Pt received in bed, pleasant and willing to participate. She required mod assist bed mobility, mod assist sit to stand with RW, and mod assist SPT with RW, +2 safety/lines. Retropulsive with stance requiring mod assist to maintain balance. Fatigues quickly. Desat to 75% on 6L during transfer. Increased recovery time to 90%. Max HR 134 during mobility. HR 100 in recliner at end of session.   Follow Up Recommendations  CIR     Equipment Recommendations  None recommended by PT    Recommendations for Other Services       Precautions / Restrictions Precautions Precautions: Fall;Other (comment) Precaution Comments: watch vitals    Mobility  Bed Mobility Overal bed mobility: Needs Assistance Bed Mobility: Supine to Sit     Supine to sit: Mod assist     General bed mobility comments: assist with BLE and trunk, use of bed pad to scoot to EOB  Transfers Overall transfer level: Needs assistance Equipment used: Rolling walker (2 wheeled) Transfers: Sit to/from [facility] Sit to Stand: Mod assist Stand pivot transfers: Mod  assist;+2 safety/equipment       General transfer comment: cues for hand placement and sequencing, multi episodes of LOB posteriorly during pivot  Ambulation/Gait Ambulation/Gait assistance: Mod assist   Assistive device: Rolling walker (2 wheeled) Gait Pattern/deviations: Ataxic;Shuffle     General Gait Details: pivot steps bed to recliner with RW. Unable to progress distance due to desat and increased HR   Stairs             Wheelchair Mobility    Modified Rankin (Stroke Patients Only) Modified Rankin (Stroke Patients Only) Pre-Morbid Rankin Score: Moderately severe disability Modified Rankin: Severe disability     Balance Overall balance assessment: Needs assistance Sitting-balance support: Feet supported;Bilateral upper extremity supported;No upper extremity supported Sitting balance-Leahy Scale: Fair     Standing balance support: Bilateral upper extremity supported;During functional activity Standing balance-Leahy Scale: Poor Standing balance comment: reliant on external support                            Cognition Arousal/Alertness: Awake/alert Behavior During Therapy: Flat affect Overall Cognitive Status: Impaired/Different from baseline Area of Impairment: Orientation;Attention;Memory;Safety/judgement;Problem solving;Following commands;Awareness                 Orientation Level: Disoriented to;Time;Situation Current Attention Level: Sustained Memory: Decreased short-term memory Following Commands: Follows one step commands consistently;Follows one step commands with increased time Safety/Judgement: Decreased awareness of deficits Awareness: Emergent Problem Solving: Slow processing;Decreased initiation;Requires verbal cues;Requires tactile cues;Difficulty sequencing        Exercises      General Comments General comments (skin integrity, edema,  etc.): SpO2 95% at rest on 5L. Desat to 83% EOB. O2 increased to 6L with  improvement to 86%. Pt on 6L during transfer bed to recliner, desat to 75%. Increased recovery time to 90% seated in recliner. Max HR 134. HR 100 in recliner at end of session.      Pertinent Vitals/Pain Pain Assessment: Faces Faces Pain Scale: Hurts a little bit Pain Location: mouth Pain Descriptors / Indicators: Discomfort Pain Intervention(s): Other (comment) (assisted with brushing teeth/mouth moisturizer)    Home Living                      Prior Function            PT Goals (current goals can now be found in the care plan section) Acute Rehab PT Goals Patient Stated Goal: not stated Progress towards PT goals: Progressing toward goals    Frequency    Min 4X/week      PT Plan Current plan remains appropriate    Co-evaluation PT/OT/SLP Co-Evaluation/Treatment: Yes Reason for Co-Treatment: Complexity of the patient's impairments (multi-system involvement);For patient/therapist safety;To address functional/ADL transfers PT goals addressed during session: Mobility/safety with mobility;Balance;Proper use of DME        AM-PAC PT "6 Clicks" Mobility   Outcome Measure  Help needed turning from your back to your side while in a flat bed without using bedrails?: A Lot Help needed moving from lying on your back to sitting on the side of a flat bed without using bedrails?: A Lot Help needed moving to and from a bed to a chair (including a wheelchair)?: A Lot Help needed standing up from a chair using your arms (e.g., wheelchair or bedside chair)?: A Lot Help needed to walk in hospital room?: A Lot Help needed climbing 3-5 steps with a railing? : Total 6 Click Score: 11    End of Session Equipment Utilized During Treatment: Oxygen Activity Tolerance: Patient tolerated treatment well Patient left: in chair;with call bell/phone within reach;with chair alarm set Nurse Communication: Mobility status PT Visit Diagnosis: Muscle weakness (generalized)  (M62.81);Difficulty in walking, not elsewhere classified (R26.2);Ataxic gait (R26.0);Other symptoms and signs involving the nervous system (R29.898)     Time: 7494-4967 PT Time Calculation (min) (ACUTE ONLY): 28 min  Charges:  $Therapeutic Activity: 8-22 mins                     Lorrin Goodell, PT  Office # [phone] Pager [phone]    Lorriane Shire 09/27/2020, 12:52 PM

## 2020-09-27 NOTE — Progress Notes (Signed)
Reached out to cortrak team regarding either or not abdominal xray was required after placement of a new cortrak. RD on call confirmed it is not necessary and that it is ready for use. Patient's primary nurse made aware. Will continue to monitor.

## 2020-09-27 NOTE — Progress Notes (Signed)
Txt to Dr.Krishnan: 5W8 [place]:  Cortrak in place, may I get an xray order to confirm placement?  Thx, Arica [place]  Per note from [employer] RD "no x-ray required. RN may begin using tube".  Dr. [place] Pink said he would refer back to [facility] team as they have protocols in place.  If they say it is ok to use, he said to use.  Dicussed with Agricultural consultant, Elisa.  She is reaching out to the [employer] team for comfirmation.

## 2020-09-27 NOTE — Procedures (Signed)
Cortrak  Person Inserting Tube:  Jkai Arwood E, RD Tube Type:  Cortrak - 43 inches Tube Location:  Left nare Initial Placement:  Stomach Secured by: Bridle Technique Used to Measure Tube Placement:  Documented cm marking at nare/ corner of mouth Cortrak Secured At:  65 cm    Cortrak Tube Team Note:  Consult received to place a Cortrak feeding tube.   No x-ray is required. RN may begin using tube.    If the tube becomes dislodged please keep the tube and contact the Cortrak team at [email or web address] (password [email or web address]) for replacement.  If after hours and replacement cannot be delayed, place a NG tube and confirm placement with an abdominal x-ray.    Rowdy Guerrini, MS, RD, LDN RD pager number and weekend/on-call pager number located in Amion.   

## 2020-09-27 NOTE — Progress Notes (Signed)
Notified Dr. Maryland Pink:  0S8 Durward Fortes, I: tele called reported @1403  pt has 13 beats of VT HR 180. Pt. asymptomatic.

## 2020-09-27 NOTE — Progress Notes (Signed)
Occupational Therapy Treatment Patient Details Name: April Brewer MRN: [id number] DOB: 02-Jul-1946 Today's Date: 09/27/2020    History of present illness Pt is 74 y.o. female with medical history significant of hyperlipidemia, hypothyroidism, GERD, lymphocytic colitis, anxiety, and depression presented with complaints of shortness of breath and cough over the last 2 - 3 days. Noted associated symptoms of fever, loss of taste/smell, poor appetite, nausea, generalized weakness, and diarrhea. COVID-19 screening was positive. Pt admitted with COVID 19 PNE resp failure and on high flow oxygen.  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   OT comments  Pt progressing towards established OT goals; however, decreased coordination and balance since recent CVA. Pt very motivated to participate in therapy despite fatigue. SpO2 95% at rest on 5L. Desat to 83% EOB. O2 increased to 6L with improvement to 86%. Pt requiring Mod A +2 for stand pivot to recliner. Pt on 6L during transfer bed to recliner, desat to 75%. Increased recovery time to 90% seated in recliner. Pt requiring Min A for oral care task while seated in recliner. Continue to recommend dc to CIR for intensive OT and will continue to follow acutely as admitted.   SpO2 >83% on 6L. Max HR 134.    Follow Up Recommendations  CIR;Supervision/Assistance - 24 hour    Equipment Recommendations  3 in 1 bedside commode    Recommendations for Other Services PT consult    Precautions / Restrictions Precautions Precautions: Fall;Other (comment) Precaution Comments: watch vitals       Mobility Bed Mobility Overal bed mobility: Needs Assistance Bed Mobility: Supine to Sit     Supine to sit: Mod assist     General bed mobility comments: assist with  BLE and trunk, use of bed pad to scoot to EOB  Transfers Overall transfer level: Needs assistance Equipment used: Rolling walker (2 wheeled) Transfers: Sit to/from [facility] Sit to Stand: Mod assist Stand pivot transfers: Mod assist;+2 safety/equipment       General transfer comment: cues for hand placement and sequencing, multi episodes of LOB posteriorly during pivot    Balance Overall balance assessment: Needs assistance Sitting-balance support: Feet supported;Bilateral upper extremity supported;No upper extremity supported Sitting balance-Leahy Scale: Fair Sitting balance - Comments: UE support seated on recliner on edge of chair   Standing balance support: Bilateral upper extremity supported;During functional activity Standing balance-Leahy Scale: Poor Standing balance comment: reliant on external support                           ADL either performed or assessed with clinical judgement   ADL Overall ADL's : Needs assistance/impaired     Grooming: Minimal assistance;Oral care;Sitting Grooming Details (indicate cue type and reason): Min A for setting up and preparing mouth swab. Presenting with poor coorindation to reach and grasp mouth swab. Cues for use of suction                 Toilet Transfer: +2 for physical assistance;+2 for safety/equipment;RW;Stand-pivot;Moderate assistance Toilet Transfer Details (indicate cue type and reason): Mod A +2 for balance. pt presenting with posterior lean and decreased coorindation. Pt verbalizing fear of falling in standing         Functional mobility during ADLs: Rolling walker;+2 for physical assistance;+2 for  safety/equipment;Moderate assistance General ADL Comments: Pt performing pivot to recliner and then oral care. Presenting with decreased balance, coorindation, cognition, and activity tolerance.      Vision       Perception     Praxis      Cognition Arousal/Alertness:  Awake/alert Behavior During Therapy: Flat affect Overall Cognitive Status: Impaired/Different from baseline Area of Impairment: Orientation;Attention;Memory;Safety/judgement;Problem solving;Following commands;Awareness                 Orientation Level: Disoriented to;Time;Situation Current Attention Level: Sustained Memory: Decreased short-term memory Following Commands: Follows one step commands consistently;Follows one step commands with increased time Safety/Judgement: Decreased awareness of deficits Awareness: Emergent Problem Solving: Slow processing;Decreased initiation;Requires verbal cues;Requires tactile cues;Difficulty sequencing General Comments: Increased time for answering questions and processing cues. Able to state her sons' names and that she lives with "[name]".         Exercises     Shoulder Instructions       General Comments SpO2 95% at rest on 5L. Desat to 83% EOB. O2 increased to 6L with improvement to 86%. Pt on 6L during transfer bed to recliner, desat to 75%. Increased recovery time to 90% seated in recliner. Max HR 134. HR 100 in recliner at end of session.    Pertinent Vitals/ Pain       Pain Assessment: Faces Faces Pain Scale: Hurts a little bit Pain Location: mouth Pain Descriptors / Indicators: Discomfort Pain Intervention(s): Monitored during session;Limited activity within patient's tolerance;Repositioned  Home Living                                          Prior Functioning/Environment              Frequency  Min 3X/week        Progress Toward Goals  OT Goals(current goals can now be found in the care plan section)  Progress towards OT goals: Progressing toward goals  Acute Rehab OT Goals Patient Stated Goal: not stated OT Goal Formulation: With patient Time For Goal Achievement: 09/30/20 Potential to Achieve Goals: Good ADL Goals Pt Will Perform Grooming: with modified  independence;standing;sitting Pt Will Perform Lower Body Dressing: with modified independence;sitting/lateral leans;sit to/from stand Pt Will Transfer to Toilet: with modified independence;bedside commode;ambulating Pt Will Perform Toileting - Clothing Manipulation and hygiene: with modified independence;sitting/lateral leans;sit to/from stand Additional ADL Goal #1: Pt will independently monitor SpO2 and use purse lip breathing for ADLs Additional ADL Goal #2: Pt will independently verbalize three energy conservation techniques for ADLs and IADLs  Plan Discharge plan remains appropriate    Co-evaluation      Reason for Co-Treatment: Complexity of the patient's impairments (multi-system involvement);For patient/therapist safety;To address functional/ADL transfers PT goals addressed during session: Mobility/safety with mobility;Balance;Proper use of DME        AM-PAC OT "6 Clicks" Daily Activity     Outcome Measure   Help from another person eating meals?: A Little Help from another person taking care of personal grooming?: A Little Help from another person toileting, which includes using toliet, bedpan, or urinal?: A Lot Help from another person bathing (including washing, rinsing, drying)?: A Lot Help from another person to put on and taking off regular upper body clothing?: A Lot Help from another person to put on and taking off regular lower body clothing?: A Lot 6 Click Score: 14    End  of Session Equipment Utilized During Treatment: Gait belt;Rolling walker;Oxygen  OT Visit Diagnosis: Unsteadiness on feet (R26.81);Other abnormalities of gait and mobility (R26.89);Muscle weakness (generalized) (M62.81);Other symptoms and signs involving cognitive function   Activity Tolerance Patient tolerated treatment well   Patient Left in chair;with call bell/phone within reach;with chair alarm set   Nurse Communication Mobility status        Time: 9021-1155 OT Time Calculation  (min): 28 min  Charges: OT General Charges $OT Visit: 1 Visit OT Treatments $Therapeutic Activity: 8-22 mins  [place], OTR/L Acute Rehab Pager: [phone] Office: [place] 09/27/2020, 2:03 PM

## 2020-09-27 DEATH — deceased

## 2020-09-28 DIAGNOSIS — J1282 Pneumonia due to Coronavirus disease 2019: Secondary | ICD-10-CM | POA: Diagnosis not present

## 2020-09-28 DIAGNOSIS — U071 COVID-19: Secondary | ICD-10-CM | POA: Diagnosis not present

## 2020-09-28 DIAGNOSIS — J9601 Acute respiratory failure with hypoxia: Secondary | ICD-10-CM | POA: Diagnosis not present

## 2020-09-28 LAB — BASIC METABOLIC PANEL
Anion gap: 9 (ref 5–15)
BUN: 17 mg/dL (ref 8–23)
CO2: 23 mmol/L (ref 22–32)
Calcium: 8.5 mg/dL — ABNORMAL LOW (ref 8.9–10.3)
Chloride: 108 mmol/L (ref 98–111)
Creatinine, Ser: 0.8 mg/dL (ref 0.44–1.00)
GFR calc Af Amer: >60 mL/min (ref >60)
GFR calc non Af Amer: >60 mL/min (ref >60)
Glucose, Bld: 153 mg/dL — ABNORMAL HIGH (ref 70–99)
Potassium: 3.6 mmol/L (ref 3.5–5.1)
Sodium: 140 mmol/L (ref 135–145)

## 2020-09-28 LAB — MAGNESIUM: Magnesium: 2.3 mg/dL (ref 1.7–2.4)

## 2020-09-28 LAB — GLUCOSE, CAPILLARY
Glucose-Capillary: 134 mg/dL — ABNORMAL HIGH (ref 70–99)
Glucose-Capillary: 145 mg/dL — ABNORMAL HIGH (ref 70–99)
Glucose-Capillary: 148 mg/dL — ABNORMAL HIGH (ref 70–99)
Glucose-Capillary: 148 mg/dL — ABNORMAL HIGH (ref 70–99)
Glucose-Capillary: 154 mg/dL — ABNORMAL HIGH (ref 70–99)
Glucose-Capillary: 157 mg/dL — ABNORMAL HIGH (ref 70–99)
Glucose-Capillary: 172 mg/dL — ABNORMAL HIGH (ref 70–99)

## 2020-09-28 MED ORDER — LAMOTRIGINE 100 MG PO TABS
100.0000 mg | ORAL_TABLET | Freq: Every day | ORAL | Status: DC
  Administered 2020-09-29 – 2020-10-07 (×9): 100 mg
  Filled 2020-09-28 (×10): qty 1

## 2020-09-28 MED ORDER — METHYLPREDNISOLONE SODIUM SUCC 40 MG IJ SOLR
20.0000 mg | Freq: Every day | INTRAMUSCULAR | Status: AC
  Administered 2020-09-29 – 2020-10-02 (×4): 20 mg via INTRAVENOUS
  Filled 2020-09-28 (×4): qty 1

## 2020-09-28 MED ORDER — LEVOTHYROXINE SODIUM 100 MCG PO TABS
100.0000 ug | ORAL_TABLET | Freq: Every day | ORAL | Status: DC
  Administered 2020-09-28 – 2020-10-07 (×10): 100 ug
  Filled 2020-09-28 (×10): qty 1

## 2020-09-28 MED ORDER — ASPIRIN 325 MG PO TABS
325.0000 mg | ORAL_TABLET | Freq: Every day | ORAL | Status: DC
  Administered 2020-09-28 – 2020-10-07 (×10): 325 mg
  Filled 2020-09-28 (×10): qty 1

## 2020-09-28 MED ORDER — POTASSIUM CHLORIDE 20 MEQ/15ML (10%) PO SOLN
40.0000 meq | Freq: Once | ORAL | Status: AC
  Administered 2020-09-28: 40 meq
  Filled 2020-09-28: qty 30

## 2020-09-28 MED ORDER — CLOPIDOGREL BISULFATE 75 MG PO TABS
75.0000 mg | ORAL_TABLET | Freq: Every day | ORAL | Status: DC
  Administered 2020-09-28 – 2020-10-06 (×9): 75 mg
  Filled 2020-09-28 (×8): qty 1

## 2020-09-28 MED ORDER — FAMOTIDINE 40 MG/5ML PO SUSR
20.0000 mg | Freq: Every day | ORAL | Status: DC
  Administered 2020-09-28 – 2020-10-07 (×10): 20 mg
  Filled 2020-09-28 (×10): qty 2.5

## 2020-09-28 MED ORDER — ATORVASTATIN CALCIUM 10 MG PO TABS
10.0000 mg | ORAL_TABLET | Freq: Every day | ORAL | Status: DC
  Administered 2020-09-29 – 2020-10-07 (×9): 10 mg
  Filled 2020-09-28 (×10): qty 1

## 2020-09-28 MED ORDER — METOPROLOL TARTRATE 25 MG PO TABS
25.0000 mg | ORAL_TABLET | Freq: Two times a day (BID) | ORAL | Status: DC
  Administered 2020-09-28 – 2020-10-07 (×19): 25 mg
  Filled 2020-09-28 (×19): qty 1

## 2020-09-28 NOTE — Progress Notes (Signed)
PROGRESS NOTE                                                                                                                                                                                                             Patient Demographics:    April Brewer, is a 74 y.o. female, DOB - [birth year], VOH:[id number]  Outpatient Primary MD for the patient is McGowen, Adrian Blackwater, MD   Admit date - 09/24/2020   LOS - 14  Chief Complaint  Patient presents with   Shortness of Breath       Brief Narrative: Patient is a 74 y.o. female with PMHx of HLD, hypothyroidism, GERD, lymphocytic colitis, anxiety/depression, chronic back pain on narcotics-presented with several days history of shortness of breath-found to have acute hypoxic respiratory failure secondary to COVID-19 pneumonia.  Patient was admitted to the hospitalist service-started on COVID-19 treatment-unfortunately further hospital course complicated by acute CVA.  See below for further details.    COVID-19 vaccinated status: Unvaccinated  Significant Events: 9/18>> Admit to [facility] for severe hypoxia due to COVID-19 pneumonia 9/26>> worsening lethargy-MRI brain +ve for acute CVA  Significant studies: 9/17>>Chest x-ray: Diffuse interstitial and groundglass opacities 9/26>> MRI brain: Multifocal acute infarcts in the posterior circulation. 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 History is limited. Denies any pain issues at this time.   Assessment  & Plan :   Acute Hypoxic Resp Failure due to Covid 19 Viral pneumonia Had severe hypoxemia on initial presentation requiring 15 L of HFNC. Patient's respiratory status seems to be improving. She is currently noted to be on 6 L of oxygen saturating in the late 90s. She was dropped down to 4 L. Saturations stayed in the 90s. Continue to wean as tolerated. Patient has completed course of Remdesivir. Remains on steroids which is being tapered down. Patient was also  on baricitinib which had to be held due to dysphagia from acute stroke. Continue incentive spirometry and mobilization. D-dimer improved to 0.81. CRP has been improving as well.  Acute ischemic CVA Felt to be due to hypercoagulable state in the setting of COVID-19. Telemetry without any arrhythmias. Echo without any obvious embolic source.   Followed by PT OT and SLP. Remains high aspiration risk Core track feeding tube was placed yesterday.  She will need to be on aspirin and Plavix for 3 months followed by aspirin alone.    Oropharyngeal dysphagia Did not do well with the modified barium swallow on 9/30.  Cortrak tube was placed yesterday. Tube feedings have been initiated.   PSVT Noted on tele. Patient asymptomatic. Recent echo reviewed. TSH was normal, 1.86 on 8/30. Started on IV metoprolol. Will change to metoprolol on the tube. Magnesium 2.3. Potassium 3.6. Will be repleted.  Hypernatremia Likely due to free water deficit from lack of oral intake. Improved this morning to 140.   Hypokalemia Potassium to be repleted.  Transaminitis Improved-secondary to COVID-19.  HLD LDL noted to be 72. Resume statin.  Hypothyroidism Continue Synthroid.  Anxiety/depression Seems to be stable.   Her medications including Xanax lamotrigine and Zoloft are currently on hold.    Chronic back pain Unable to give oxycodone  is n.p.o.-start as needed IV morphine-patient on chronic narcotics at home.  Goals of care:  Done by previous rounding physician. Currently DNR.   GI prophylaxis:PPI  Consults: Neurology  Family Communication  :  Son (Thomas [phone]). Son was updated yesterday. Will do so again today.  Code Status :   DNR  Diet :  Diet Order    None       Disposition Plan  : CIR is recommended by physical therapy. She will need to be at least 21 days out from her infection for her to be able to go to CIR.  Status is: Inpatient  Remains inpatient appropriate because:Inpatient level of care appropriate due to severity of illness   Dispo: The patient is from: Home              Anticipated d/c is to: SNF versus CIR.              Anticipated d/c date is: > 3 days              Patient currently is not medically stable to d/c.    Antimicorbials  :    Anti-infectives (From admission, onward)   Start     Dose/Rate Route Frequency Ordered Stop   09/14/20 2200  cefTRIAXone (ROCEPHIN) 2 g in sodium chloride 0.9 % 100 mL IVPB  Status:  Discontinued        2 g 200 mL/hr over 30 Minutes Intravenous Every 24 hours 09/14/20 2043 09/15/20 1102   09/14/20 2200  azithromycin (ZITHROMAX) 500 mg in sodium chloride 0.9 % 250 mL IVPB  Status:  Discontinued        500 mg 250 mL/hr over 60 Minutes Intravenous Every 24 hours 09/14/20 2043 09/15/20 1102   09/14/20 1000  remdesivir 100 mg in sodium chloride 0.9 % 100 mL IVPB       "Followed by" Linked Group Details   100 mg 200 mL/hr over 30 Minutes Intravenous Daily 09/15/2020 2306 09/17/20 0907   09/12/2020 2330  remdesivir 100 mg in sodium chloride 0.9 % 100 mL IVPB       "Followed by" Linked Group Details   100  mg 200 mL/hr over 30 Minutes Intravenous Every 30 min 09/15/2020 2306 09/14/20 0033      Inpatient Medications  Scheduled Meds:  acidophilus  1 capsule Oral Daily   albuterol  2 puff Inhalation Q6H   vitamin C  500 mg Oral Daily    aspirin  300 mg Rectal Daily   atorvastatin  10 mg Oral Daily   enoxaparin (LOVENOX) injection  40 mg Subcutaneous Q24H   feeding supplement (ENSURE ENLIVE)  237 mL Oral BID BM   feeding supplement (PROSource TF)  45 mL Per Tube BID   free water  170 mL Per Tube Q4H   influenza vaccine adjuvanted  0.5 mL Intramuscular Tomorrow-1000   lamoTRIgine  100 mg Oral Daily   levothyroxine  40 mcg Intravenous Daily   mouth rinse  15 mL Mouth Rinse BID   methylPREDNISolone (SOLU-MEDROL) injection  20 mg Intravenous Q12H   metoprolol tartrate  2.5 mg Intravenous Q8H   pantoprazole (PROTONIX) IV  40 mg Intravenous Q24H   sertraline  100 mg Oral BID   sodium chloride flush  10-40 mL Intracatheter Q12H   sodium chloride flush  3 mL Intravenous Q12H   zinc sulfate  220 mg Oral Daily   Continuous Infusions:  sodium chloride Stopped (09/15/20 0004)   sodium chloride Stopped (09/14/20 2233)   dextrose 50 mL/hr at 09/28/20 1052   feeding supplement (OSMOLITE 1.5 CAL) 35 mL/hr at 09/28/20 0524   PRN Meds:.sodium chloride, sodium chloride, acetaminophen, chlorpheniramine-HYDROcodone, guaiFENesin-dextromethorphan, LORazepam, morphine injection, ondansetron **OR** ondansetron (ZOFRAN) IV, phenol, sodium chloride flush    Bonnielee Haff M.D on 09/28/2020 at 11:25 AM  To page go to [email or web address] - use universal password  [facility] -  Office  [phone]    Objective:   Vitals:   09/27/20 2354 09/28/20 0458 09/28/20 0500 09/28/20 0736  BP: 121/85 (!) 112/96  110/74  Pulse: 83 83  79  Resp: (!) 22 (!) 21  18  Temp: 98.1 F (36.7 C) 98 F (36.7 C)  (!) 97.5 F (36.4 C)  TempSrc: Axillary Axillary  Oral  SpO2: 99% 99%  97%  Weight:   63.6 kg   Height:        Wt Readings from Last 3 Encounters:  09/28/20 63.6 kg  09/22/20 66.2 kg  09/04/20 69.2 kg     Intake/Output Summary (Last 24 hours) at 09/28/2020 1125 Last data filed at 09/28/2020 0500 Gross per 24 hour   Intake 159.58 ml  Output 200 ml  Net -40.42 ml     Physical Exam  General appearance: Awake alert.  In no distress. Dysarthric Resp: Good air entry bilaterally. Normal effort. Few crackles at the bases. Cardio: S1-S2 is normal regular.  No S3-S4.  No rubs murmurs or bruit GI: Abdomen is soft.  Nontender nondistended.  Bowel sounds are present normal.  No masses organomegaly Extremities: No edema.   Neurologic: Dysarthria noted. Moving all her extremities.      Data Review:    CBC Recent Labs  Lab 09/22/20 0418 09/23/20 0500 09/24/20 0109 09/26/20 1021 09/27/20 0604  WBC 14.8* 10.3 16.1* 13.5* 14.3*  HGB 11.0* 10.6* 11.6* 12.1 12.3  HCT 34.0* 33.0* 35.4* 37.4 39.0  PLT 434* 381 430* 404* 380  MCV 92.1 92.2 93.4 95.2 94.9  MCH 29.8 29.6 30.6 30.8 29.9  MCHC 32.4 32.1 32.8 32.4 31.5  RDW 14.6 14.9 15.3 15.7* 15.6*    Chemistries  Recent Labs  Lab 09/22/20 0418 09/22/20 0418 09/23/20  0500 09/24/20 0109 09/26/20 1021 09/27/20 0604 09/28/20 0500  NA 138   < > 141 143 152* 149* 140  K 4.2   < > 4.3 4.3 3.4* 3.8 3.6  CL 103   < > 108 110 117* 111 108  CO2 24   < > 24 19* 22 25 23   GLUCOSE 110*   < > 129* 103* 115* 128* 153*  BUN 22   < > 23 21 22 18 17   CREATININE 1.02*   < > 0.96 0.84 0.83 0.85 0.80  CALCIUM 8.9   < > 9.0 9.2 8.9 9.2 8.5*  MG  --   --   --   --   --   --  2.3  AST 44*  --  42* 42* 30 33  --   ALT 38  --  32 31 28 27   --   ALKPHOS 97  --  87 86 72 77  --   BILITOT 1.0  --  0.8 1.4* 1.1 1.2  --    < > = values in this interval not displayed.    Micro Results No results found for this or any previous visit (from the past 240 hour(s)).  Radiology Reports CT ANGIO HEAD W OR WO CONTRAST  Result Date: 09/24/2020 CLINICAL DATA:  Posterior circulation stroke.  COVID positive. EXAM: CT ANGIOGRAPHY HEAD AND NECK TECHNIQUE: Multidetector CT imaging of the head and neck was performed using the standard protocol during bolus administration of  intravenous contrast. Multiplanar CT image reconstructions and MIPs were obtained to evaluate the vascular anatomy. Carotid stenosis measurements (when applicable) are obtained utilizing NASCET criteria, using the distal internal carotid diameter as the denominator. CONTRAST:  45mL OMNIPAQUE IOHEXOL 350 MG/ML SOLN COMPARISON:  MRI 09/22/2020 FINDINGS: CTA NECK FINDINGS Aortic arch: Visualized portions demonstrate atherosclerosis without significant stenosis or aneurysm. Right carotid system: No evidence of dissection, stenosis (50% or greater) or occlusion. Left carotid system: No evidence of dissection, stenosis (50% or greater) or occlusion. Vertebral arteries: Right dominant. There is abrupt occlusion of the left vertebral artery at the level of C2. Reconstitution immediately before the vertebral artery courses intradural E, which may be from retrograde flow and/or collaterals. Skeleton: Multilevel severe degenerative disc disease in the cervical spine. Other neck: No mass lesion or adenopathy. Upper chest: Peripheral predominant ground-glass opacities in the imaged lung apices, compatible with COVID pneumonia. Review of the MIP images confirms the above findings CTA HEAD FINDINGS Anterior circulation: No significant stenosis, proximal occlusion, aneurysm, or vascular malformation. Posterior circulation: Abrupt inclusion of the distal right P2 PCA (series 5, image 448). a Venous sinuses: As permitted by contrast timing, patent. Additional comments: Partially imaged edema associated with multifocal posterior circulation infarcts that were better characterized on recent MRI. Review of the MIP images confirms the above findings IMPRESSION: 1. 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 2. Partially imaged edema associated with multifocal posterior circulation infarcts that were better characterized on recent MRI. Non-contrast head CT could further evaluate  the degree of edema/mass effect if clinically indicated. 3. Peripheral predominant ground-glass opacities in the imaged lung apices, compatible with COVID pneumonia given the clinical history. These results will be called to the ordering clinician or representative by the Radiologist Assistant, and communication documented in the PACS or [employer]. Electronically Signed   By: Margaretha Sheffield MD   On: 09/24/2020 09:45   CT ANGIO NECK W  OR WO CONTRAST  Result Date: 09/24/2020 CLINICAL DATA:  Posterior circulation stroke.  COVID positive. EXAM: CT ANGIOGRAPHY HEAD AND NECK TECHNIQUE: Multidetector CT imaging of the head and neck was performed using the standard protocol during bolus administration of intravenous contrast. Multiplanar CT image reconstructions and MIPs were obtained to evaluate the vascular anatomy. Carotid stenosis measurements (when applicable) are obtained utilizing NASCET criteria, using the distal internal carotid diameter as the denominator. CONTRAST:  22mL OMNIPAQUE IOHEXOL 350 MG/ML SOLN COMPARISON:  MRI 09/22/2020 FINDINGS: CTA NECK FINDINGS Aortic arch: Visualized portions demonstrate atherosclerosis without significant stenosis or aneurysm. Right carotid system: No evidence of dissection, stenosis (50% or greater) or occlusion. Left carotid system: No evidence of dissection, stenosis (50% or greater) or occlusion. Vertebral arteries: Right dominant. There is abrupt occlusion of the left vertebral artery at the level of C2. Reconstitution immediately before the vertebral artery courses intradural E, which may be from retrograde flow and/or collaterals. Skeleton: Multilevel severe degenerative disc disease in the cervical spine. Other neck: No mass lesion or adenopathy. Upper chest: Peripheral predominant ground-glass opacities in the imaged lung apices, compatible with COVID pneumonia. Review of the MIP images confirms the above findings CTA HEAD FINDINGS Anterior circulation: No  significant stenosis, proximal occlusion, aneurysm, or vascular malformation. Posterior circulation: Abrupt inclusion of the distal right P2 PCA (series 5, image 448). a Venous sinuses: As permitted by contrast timing, patent. Additional comments: Partially imaged edema associated with multifocal posterior circulation infarcts that were better characterized on recent MRI. Review of the MIP images confirms the above findings IMPRESSION: 1. 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 2. Partially imaged edema associated with multifocal posterior circulation infarcts that were better characterized on recent MRI. Non-contrast head CT could further evaluate the degree of edema/mass effect if clinically indicated. 3. Peripheral predominant ground-glass opacities in the imaged lung apices, compatible with COVID pneumonia given the clinical history. These results will be called to the ordering clinician or representative by the Radiologist Assistant, and communication documented in the PACS or [employer]. Electronically Signed   By: Margaretha Sheffield MD   On: 09/24/2020 09:45   MR BRAIN WO CONTRAST  Result Date: 09/22/2020 CLINICAL DATA:  Delirium.  COVID positive EXAM: MRI HEAD WITHOUT CONTRAST TECHNIQUE: Multiplanar, multiecho pulse sequences of the brain and surrounding structures were obtained without intravenous contrast. COMPARISON:  None. FINDINGS: Brain: Extensive acute infarction in the posterior circulation, patchily present throughout the left more than right cerebellum with right cerebellar involvement mainly at the superior cerebellar distribution and left-sided involvement both at the PICA and superior cerebellar arteries. Right more than left PCA involvement involving the bilateral thalamus and right more than left occipital cortex. Right more than left hippocampal region involvement. No anterior circulation infarct is seen. No hemorrhage,  hydrocephalus, or masslike finding. Mild chronic small vessel disease. Cerebral volume loss. Vascular: A right vertebral and basilar flow void or visible. The left vertebral artery could be hypoplastic or diseased. Skull and upper cervical spine: Normal marrow signal. Cervical spine degeneration with C3-4 and C4-5 anterolisthesis Sinuses/Orbits: Bilateral cataract resection. Nasal septal perforation. Other: Progressively motion degraded study ASAP these results will be called to the ordering clinician or representative by the Radiologist Assistant, and communication documented in the PACS or [employer]. 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 Electronically Signed   By: Monte Fantasia M.D.   On: 09/22/2020 11:45  DG Chest Portable 1 View  Result Date: 09/16/2020 CLINICAL DATA:  Cough, congestion, fever, loss of taste and smell for 1 week EXAM: PORTABLE CHEST 1 VIEW COMPARISON:  01/11/2014 FINDINGS: Single frontal view of the chest demonstrates stable enlarged cardiac silhouette. There is diffuse increased interstitial prominence with bilateral ground-glass consolidation. No effusion or pneumothorax. No acute bony abnormalities. Bilateral shoulder arthroplasties. IMPRESSION: 1. Diffuse interstitial and ground-glass opacities, consistent with bilateral atypical pneumonia given clinical history. Electronically Signed   By: Randa Ngo M.D.   On: 08/29/2020 23:33   DG Swallowing Func-Speech Pathology  Result Date: 09/26/2020 Objective Swallowing Evaluation: Type of Study: MBS-Modified Barium Swallow Study  Patient Details Name: DONYEL CASTAGNOLA MRN: [id number] Date of Birth: 1946/06/23 Today's Date: 09/26/2020 Time: SLP Start Time (ACUTE ONLY): 4174 -SLP Stop Time (ACUTE ONLY): 1429 SLP Time Calculation (min) (ACUTE ONLY): 17 min Past Medical History: Past Medical History: Diagnosis Date  ALLERGIC RHINITIS 06/02/2007  Allergy   Arthritis   DDD of cervical,  thoracic, and lumbar spine  Blood transfusion without reported diagnosis   Cataract   Bilateral removed cateracts  Cervical spondylosis   C5-6 and C6-7.  Pain mgmt as per Dr. Nelva Bush.  Chronic gastritis without bleeding   Chronic low back pain 02/18/2009  s/p fusion L3-4 through L5-S1.  Pain meds per Dr. Nelva Bush  Chronic renal insufficiency, stage 3 (moderate)   borderline II/III (GFR about 55 ml/min) as of 2021  DEPRESSION 06/02/2007  Diverticulosis   GERD (gastroesophageal reflux disease)   Heart murmur   Hiatal hernia   Hypercholesterolemia   HYPOTHYROIDISM 06/02/2007  GOITER  IBS (irritable bowel syndrome)   INSOMNIA 06/02/2007  Lymphocytic colitis   Muscle spasms of lower extremity   Nasal septal perforation   chronic; hx of epistaxis  Normocytic anemia 04/2015  HEME + in ED 08/07/15--endoscopic eval unrevealing.  Iron studies fine-->?Anemia of chronic dz (lymphocytic colitis?).  Hb 02/2020 11.5.  OSTEOPENIA 02/18/2009  Repeat DEXA 07/2014 showed osteoporosis by T score in radius but spine and hip T scores were normal-continue vit D and calcium and repeat DEXA 08/2016 unchanged.  Repeat DEXA 2 yrs.  Peripheral edema   LE's, nonpitting  Renal cyst, acquired, right   1.2 cm, 2021  Simple hepatic cyst   x 1. 2021  Trochanteric bursitis of both hips   inj 08/11/20 Past Surgical History: Past Surgical History: Procedure Laterality Date  ABDOMINAL HYSTERECTOMY    for DUB (ovaries are still in)  BACK SURGERY    X 5: fusion of L3-L4 through L5-S1  CATARACT EXTRACTION Bilateral   CHOLECYSTECTOMY  2012  COLONOSCOPY  06/2007; 01/2015  2008 Diverticulosis, o/w normal.  2016 showed lymphocytic colitis with mild diverticulosis: pt was started on oral budesonide at that time.  CT SCAN  09/03/2017  ABDOMEN, HEAD  DEXA  08/16/2014; 09/14/16  Hip and spine ok; radius osteoporotic---but meds not indicated.  Repeat 08/2018  ESOPHAGOGASTRODUODENOSCOPY  10/2007; 11/27/15; 04/26/20  03/2020: chronic gastritis, H pylori neg,  celiac neg.  +Barretts.  Hyperplastic gastric polyp.  HIP CLOSED REDUCTION Right 09/20/2018  Procedure: CLOSED REDUCTION HIP;  Surgeon: Paralee Cancel, MD;  Location: [facility];  Service: Orthopedics;  Laterality: Right;  REVERSE SHOULDER ARTHROPLASTY Left 06/09/2019  Procedure: REVERSE SHOULDER ARTHROPLASTY;  Surgeon: Netta Cedars, MD;  Location: [facility];  Service: Orthopedics;  Laterality: Left;  SHOULDER SURGERY Right   \  TOTAL HIP ARTHROPLASTY  05/31/2012  Procedure: TOTAL HIP ARTHROPLASTY ANTERIOR APPROACH;  Surgeon: Mauri Pole, MD;  Location: [facility];  Service: Orthopedics;  Laterality: Left;  TOTAL HIP ARTHROPLASTY Right 08/30/2018  Procedure: RIGHT TOTAL HIP ARTHROPLASTY ANTERIOR APPROACH;  Surgeon: Paralee Cancel, MD;  Location: [facility];  Service: Orthopedics;  Laterality: Right;  70 mins HPI: Patient is a 74 y.o. female with PMHx of HLD, hypothyroidism, GERD, lymphocytic colitis, anxiety/depression, chronic back pain on narcotics-presented with several days history of shortness of breath-found to have acute hypoxic respiratory failure secondary to COVID-19 pneumonia.  Patient started to develop some lethargy 9/24, progressive, with no improvement despite holding Xanax and oxycodone, MRI brain significant for acute CVA 9/26 .desat to 83 on oxygen. 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  Swallow eval ordered.  Subjective: pt awake in bed Assessment / Plan / Recommendation [facility] IP CLINICAL IMPRESSIONS 09/26/2020 Clinical Impression Severe oropharyngeal dysphagia marked by oral holding, lingual residue, inefficient posterior propulsion, late swallow initiation, decreased pharyngeal contraction. Initially able to propel nectar and first trial of puree but subsequent attempts were unsuccessful. Pt held head in an extended postion (diagnosed with cervical spinal degeneration with C3-4 and C4-5 anterolisthesis; dry spoon  presentations and verbal cues were not effective. Barium reached her pyriform sinuses for several minutes and was eventually suctioned. Swallow initiated late therefore nectar thick barium dumped to her small pyriform sinus cavities and immediately fell into trachea with immediate cough. Residue in valleculae was max with puree. Recommend continued NPO and ST in attempts to return to po's. Continue oral care.     SLP Visit Diagnosis Dysphagia, oropharyngeal phase (R13.12) Attention and concentration deficit following -- Frontal lobe and executive function deficit following -- Impact on safety and function Severe aspiration risk   CHL IP TREATMENT RECOMMENDATION 09/26/2020 Treatment Recommendations Therapy as outlined in treatment plan below   Prognosis 09/26/2020 Prognosis for Safe Diet Advancement Fair Barriers to Reach Goals Severity of deficits Barriers/Prognosis Comment -- [facility] IP DIET RECOMMENDATION 09/26/2020 SLP Diet Recommendations NPO Liquid Administration via -- Medication Administration Via alternative means Compensations -- Postural Changes --   [facility] IP OTHER RECOMMENDATIONS 09/26/2020 Recommended Consults -- Oral Care Recommendations Oral care QID Other Recommendations --   [facility] IP FOLLOW UP RECOMMENDATIONS 09/26/2020 Follow up Recommendations Skilled Nursing facility   [facility] IP FREQUENCY AND DURATION 09/26/2020 Speech Therapy Frequency (ACUTE ONLY) min 2x/week Treatment Duration 2 weeks      [facility] IP ORAL PHASE 09/26/2020 Oral Phase Impaired Oral - Pudding Teaspoon -- Oral - Pudding Cup -- Oral - Honey Teaspoon -- Oral - Honey Cup -- Oral - Nectar Teaspoon -- Oral - Nectar Cup Delayed oral transit;Decreased bolus cohesion;Holding of bolus;Reduced posterior propulsion;Lingual/palatal residue Oral - Nectar Straw -- Oral - Thin Teaspoon -- Oral - Thin Cup -- Oral - Thin Straw -- Oral - Puree Holding of bolus;Delayed oral transit;Weak lingual manipulation;Reduced posterior propulsion;Lingual/palatal residue Oral - Mech  Soft -- Oral - Regular -- Oral - Multi-Consistency -- Oral - Pill -- Oral Phase - Comment --  [facility] IP PHARYNGEAL PHASE 09/26/2020 Pharyngeal Phase Impaired Pharyngeal- Pudding Teaspoon -- Pharyngeal -- Pharyngeal- Pudding Cup -- Pharyngeal -- Pharyngeal- Honey Teaspoon -- Pharyngeal -- Pharyngeal- Honey Cup -- Pharyngeal -- Pharyngeal- Nectar Teaspoon -- Pharyngeal -- Pharyngeal- Nectar Cup Delayed swallow initiation-pyriform sinuses;Pharyngeal residue - valleculae;Pharyngeal residue - pyriform Pharyngeal -- Pharyngeal- Nectar Straw -- Pharyngeal -- Pharyngeal- Thin Teaspoon -- Pharyngeal -- Pharyngeal- Thin Cup -- Pharyngeal -- Pharyngeal- Thin Straw -- Pharyngeal -- Pharyngeal- Puree Delayed swallow  initiation-vallecula;Reduced pharyngeal peristalsis;Pharyngeal residue - pyriform;Reduced tongue base retraction Pharyngeal -- Pharyngeal- Mechanical Soft -- Pharyngeal -- Pharyngeal- Regular -- Pharyngeal -- Pharyngeal- Multi-consistency -- Pharyngeal -- Pharyngeal- Pill -- Pharyngeal -- Pharyngeal Comment --  [facility] IP CERVICAL ESOPHAGEAL PHASE 09/26/2020 Cervical Esophageal Phase WFL Pudding Teaspoon -- Pudding Cup -- Honey Teaspoon -- Honey Cup -- Nectar Teaspoon -- Nectar Cup -- Nectar Straw -- Thin Teaspoon -- Thin Cup -- Thin Straw -- Puree -- Mechanical Soft -- Regular -- Multi-consistency -- Pill -- Cervical Esophageal Comment -- [place] Siren 09/26/2020, 4:48 PM Orbie Pyo Colvin Caroli.Ed Actor Pager [phone] Office [phone]              ECHOCARDIOGRAM COMPLETE  Result Date: 09/23/2020    ECHOCARDIOGRAM REPORT   Patient Name:   DHARA SCHEPP Date of Exam: 09/23/2020 Medical Rec #:  [id number]       Height:       64.0 in Accession #:    [id number]      Weight:       145.9 lb Date of Birth:  09-Apr-1946       BSA:          46.711 m Patient Age:    38 years        BP:           139/82 mmHg Patient Gender: F               HR:           84 bpm. Exam Location:  Inpatient Procedure: 2D  Echo, Cardiac Doppler and Color Doppler Indications:    Stroke 434.91 / I163.9  History:        Patient has no prior history of Echocardiogram examinations.                 Signs/Symptoms:Murmur; Risk Factors:Dyslipidemia. COVID-19                 Positive. CKD. GERD. Hypothyroidism.  Sonographer:    Jonelle Sidle Dance Referring Phys: [id number]  1. Left ventricular ejection fraction, by estimation, is 65 to 70%. The left ventricle has normal function. The left ventricle has no regional wall motion abnormalities. There is mild concentric left ventricular hypertrophy. Left ventricular diastolic parameters are consistent with Grade I diastolic dysfunction (impaired relaxation).  2. Right ventricular systolic function is normal. The right ventricular size is normal.  3. Left atrial size was mildly dilated.  4. The mitral valve is normal in structure. Mild mitral valve regurgitation. No evidence of mitral stenosis. Moderate mitral annular calcification.  5. The aortic valve is normal in structure. Aortic valve regurgitation is not visualized. No aortic stenosis is present.  6. The inferior vena cava is normal in size with greater than 50% respiratory variability, suggesting right atrial pressure of 3 mmHg. Comparison(s): No prior Echocardiogram. Conclusion(s)/Recommendation(s): No intracardiac source of embolism detected on this transthoracic study. A transesophageal echocardiogram is recommended to exclude cardiac source of embolism if clinically indicated. FINDINGS  Left Ventricle: Left ventricular ejection fraction, by estimation, is 65 to 70%. The left ventricle has normal function. The left ventricle has no regional wall motion abnormalities. The left ventricular internal cavity size was normal in size. There is  mild concentric left ventricular hypertrophy. Left ventricular diastolic parameters are consistent with Grade I diastolic dysfunction (impaired relaxation). Normal left ventricular filling  pressure. Right Ventricle: The right ventricular size is normal. No increase in right ventricular wall  thickness. Right ventricular systolic function is normal. Left Atrium: Left atrial size was mildly dilated. Right Atrium: Right atrial size was normal in size. Pericardium: There is no evidence of pericardial effusion. Mitral Valve: The mitral valve is normal in structure. There is mild thickening of the mitral valve leaflet(s). There is mild calcification of the mitral valve leaflet(s). Moderate mitral annular calcification. Mild mitral valve regurgitation. No evidence of mitral valve stenosis. Tricuspid Valve: The tricuspid valve is normal in structure. Tricuspid valve regurgitation is mild . No evidence of tricuspid stenosis. Aortic Valve: The aortic valve is normal in structure. Aortic valve regurgitation is not visualized. No aortic stenosis is present. Pulmonic Valve: The pulmonic valve was normal in structure. Pulmonic valve regurgitation is not visualized. No evidence of pulmonic stenosis. Aorta: The aortic root is normal in size and structure. Venous: The inferior vena cava is normal in size with greater than 50% respiratory variability, suggesting right atrial pressure of 3 mmHg. IAS/Shunts: No atrial level shunt detected by color flow Doppler.  LEFT VENTRICLE PLAX 2D LVIDd:         3.80 cm  Diastology LVIDs:         2.40 cm  LV e' lateral:   9.46 cm/s LV PW:         1.10 cm  LV E/e' lateral: 8.7 LV IVS:        1.10 cm LVOT diam:     2.10 cm LV SV:         105 LV SV Index:   61 LVOT Area:     3.46 cm  RIGHT VENTRICLE             IVC RV Basal diam:  2.00 cm     IVC diam: 1.20 cm RV S prime:     13.90 cm/s TAPSE (M-mode): 1.5 cm LEFT ATRIUM             Index       RIGHT ATRIUM          Index LA diam:        4.00 cm 2.34 cm/m  RA Area:     8.10 cm LA Vol (A2C):   48.1 ml 28.11 ml/m RA Volume:   13.00 ml 7.60 ml/m LA Vol (A4C):   34.5 ml 20.16 ml/m LA Biplane Vol: 42.3 ml 24.72 ml/m  AORTIC VALVE LVOT  Vmax:   148.00 cm/s LVOT Vmean:  98.300 cm/s LVOT VTI:    0.302 m  AORTA Ao Root diam: 3.20 cm Ao Asc diam:  3.10 cm MITRAL VALVE MV Area (PHT): 2.53 cm     SHUNTS MV Decel Time: 300 msec     Systemic VTI:  0.30 m MV E velocity: 81.90 cm/s   Systemic Diam: 2.10 cm MV A velocity: 133.00 cm/s MV E/A ratio:  0.62 Ena Dawley MD Electronically signed by Ena Dawley MD Signature Date/Time: 09/23/2020/5:19:20 PM    Final

## 2020-09-29 DIAGNOSIS — U071 COVID-19: Secondary | ICD-10-CM | POA: Diagnosis not present

## 2020-09-29 DIAGNOSIS — J9601 Acute respiratory failure with hypoxia: Secondary | ICD-10-CM | POA: Diagnosis not present

## 2020-09-29 DIAGNOSIS — J1282 Pneumonia due to Coronavirus disease 2019: Secondary | ICD-10-CM | POA: Diagnosis not present

## 2020-09-29 LAB — BASIC METABOLIC PANEL
Anion gap: 9 (ref 5–15)
BUN: 14 mg/dL (ref 8–23)
CO2: 24 mmol/L (ref 22–32)
Calcium: 8.4 mg/dL — ABNORMAL LOW (ref 8.9–10.3)
Chloride: 101 mmol/L (ref 98–111)
Creatinine, Ser: 0.73 mg/dL (ref 0.44–1.00)
GFR calc Af Amer: >60 mL/min (ref >60)
GFR calc non Af Amer: >60 mL/min (ref >60)
Glucose, Bld: 152 mg/dL — ABNORMAL HIGH (ref 70–99)
Potassium: 3.8 mmol/L (ref 3.5–5.1)
Sodium: 134 mmol/L — ABNORMAL LOW (ref 135–145)

## 2020-09-29 LAB — GLUCOSE, CAPILLARY
Glucose-Capillary: 152 mg/dL — ABNORMAL HIGH (ref 70–99)
Glucose-Capillary: 127 mg/dL — ABNORMAL HIGH (ref 70–99)
Glucose-Capillary: 151 mg/dL — ABNORMAL HIGH (ref 70–99)
Glucose-Capillary: 132 mg/dL — ABNORMAL HIGH (ref 70–99)

## 2020-09-29 MED ORDER — POTASSIUM CHLORIDE 20 MEQ/15ML (10%) PO SOLN
40.0000 meq | Freq: Once | ORAL | Status: AC
  Administered 2020-09-29: 40 meq
  Filled 2020-09-29: qty 30

## 2020-09-29 MED ORDER — FREE WATER
100.0000 mL | Status: DC
  Administered 2020-09-29 – 2020-09-30 (×7): 100 mL

## 2020-09-29 NOTE — Progress Notes (Signed)
PROGRESS NOTE                                                                                                                                                                                                             Patient Demographics:    April Brewer, is a 74 y.o. female, DOB - 03-03-1946, GYJ:[id number]  Outpatient Primary MD for the patient is McGowen, Adrian Blackwater, MD   Admit date - 09/04/2020   LOS - 55  Chief Complaint  Patient presents with  . Shortness of Breath       Brief Narrative: Patient is a 74 y.o. female with PMHx of HLD, hypothyroidism, GERD, lymphocytic colitis, anxiety/depression, chronic back pain on narcotics-presented with several days history of shortness of breath-found to have acute hypoxic respiratory failure secondary to COVID-19 pneumonia.  Patient was admitted to the hospitalist service-started on COVID-19 treatment-unfortunately further hospital course complicated by acute CVA.  See below for further details.    COVID-19 vaccinated status: Unvaccinated  Significant Events: 9/18>> Admit to [facility] for severe hypoxia due to COVID-19 pneumonia 9/26>> worsening lethargy-MRI brain +ve for acute CVA  Significant studies: 9/17>>Chest x-ray: Diffuse interstitial and groundglass opacities 9/26>> MRI brain: Multifocal acute infarcts in the posterior circulation. 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  No other complaints offered by the patient.  No shortness of breath.  Noted to be hypoxic after she was moved over from the bed to the chair.  No distress noted.   Assessment  & Plan :   Acute Hypoxic Resp Failure due to Covid 19 Viral pneumonia Had severe hypoxemia on initial presentation requiring 15 L of HFNC. Patient's respiratory status is stable for the most part.  She is requiring anywhere between 4 to 6 L of oxygen.  Tends to desaturate with exertion. Patient has completed course of Remdesivir.  Remains on steroids which is being tapered down. Patient was also on baricitinib which had to be held due to dysphagia from acute stroke. Continue incentive spirometry and mobilization. D-dimer improved to 0.81. CRP has been improving as well.  Acute ischemic CVA Felt to be due to hypercoagulable state in the setting of COVID-19. Telemetry without any arrhythmias. Echo without any obvious embolic source.   Followed by PT OT and SLP. Remains high aspiration risk Cortrak feeding tube was placed and tube feedings were initiated.  She will need to be on aspirin and Plavix for 3 months followed by aspirin alone.    Oropharyngeal dysphagia Did not do well with the modified barium swallow on 9/30.  Cortrak tube was placed. Tube feedings have been initiated.   PSVT Noted on tele. Patient asymptomatic. Recent echo reviewed. TSH was normal, 1.86 on 8/30.  Continue metoprolol.  Monitor electrolytes closely.  Keep potassium closer to 4.0.  Give additional today.  Hypernatremia Likely due to free water deficit from lack of oral intake.  Did improve but now down to 134.  Will decrease the amount of free water she is getting.  Hypokalemia Potassium 3.8.  Will give dose today.  Magnesium was 2.3 yesterday.  Transaminitis Improved-secondary to COVID-19.  HLD LDL noted to be 72.  Continue statin.  Hypothyroidism Continue  Synthroid.  Anxiety/depression Seems to be stable.   Her medications including Xanax lamotrigine and Zoloft are currently on hold.    Chronic back pain Patient on chronic narcotics at home.  Goals of care:  Done by previous rounding physician. Currently DNR.   GI prophylaxis:PPI  Consults: Neurology  Family Communication  :  Son (Thomas [phone]).  Son being updated daily.    Code Status :   DNR  Diet :  Diet Order    None       Disposition Plan  : CIR is recommended by physical therapy. She will need to be at least 21 days out from her infection for her to be able to go to CIR. Positive test was on 9/17.  Will complete 3 weeks on October 7.  Status is: Inpatient  Remains inpatient appropriate because:Inpatient level of care appropriate due to severity of illness   Dispo: The patient is from: Home              Anticipated d/c is to: SNF versus CIR.              Anticipated d/c date is: > 3 days              Patient currently is not medically stable to d/c.    Antimicorbials  :    Anti-infectives (From admission, onward)   Start     Dose/Rate Route Frequency Ordered Stop   09/14/20 2200  cefTRIAXone (ROCEPHIN) 2 g in sodium chloride 0.9 % 100 mL IVPB  Status:  Discontinued        2 g 200 mL/hr over 30 Minutes Intravenous Every 24 hours 09/14/20 2043 09/15/20 1102   09/14/20 2200  azithromycin (ZITHROMAX) 500 mg in sodium chloride 0.9 % 250 mL IVPB  Status:  Discontinued        500 mg 250 mL/hr over 60 Minutes Intravenous Every 24 hours 09/14/20 2043 09/15/20 1102   09/14/20 1000  remdesivir 100 mg in sodium chloride 0.9 % 100 mL IVPB       "Followed by" Linked Group Details   100 mg 200 mL/hr over 30 Minutes  Intravenous Daily 09/12/2020 2306 09/17/20 0907   09/26/2020 2330  remdesivir 100 mg in sodium chloride 0.9 % 100 mL IVPB       "Followed by" Linked Group Details   100 mg 200 mL/hr over 30 Minutes Intravenous Every 30 min 09/12/2020 2306 09/14/20 0033       Inpatient Medications  Scheduled Meds: . acidophilus  1 capsule Oral Daily  . albuterol  2 puff Inhalation Q6H  . aspirin  325 mg Per Tube Daily  . atorvastatin  10 mg Per Tube Daily  . clopidogrel  75 mg Per Tube Daily  . enoxaparin (LOVENOX) injection  40 mg Subcutaneous Q24H  . famotidine  20 mg Per Tube Daily  . feeding supplement (ENSURE ENLIVE)  237 mL Oral BID BM  . feeding supplement (PROSource TF)  45 mL Per Tube BID  . free water  100 mL Per Tube Q4H  . influenza vaccine adjuvanted  0.5 mL Intramuscular Tomorrow-1000  . lamoTRIgine  100 mg Per Tube Daily  . levothyroxine  100 mcg Per Tube Q0600  . mouth rinse  15 mL Mouth Rinse BID  . methylPREDNISolone (SOLU-MEDROL) injection  20 mg Intravenous Daily  . metoprolol tartrate  25 mg Per Tube BID  . sertraline  100 mg Oral BID  . sodium chloride flush  10-40 mL Intracatheter Q12H  . sodium chloride flush  3 mL Intravenous Q12H   Continuous Infusions: . sodium chloride Stopped (09/15/20 0004)  . sodium chloride Stopped (09/14/20 2233)  . dextrose 50 mL/hr at 09/29/20 1001  . feeding supplement (OSMOLITE 1.5 CAL) 1,000 mL (09/29/20 0042)   PRN Meds:.sodium chloride, sodium chloride, acetaminophen, chlorpheniramine-HYDROcodone, guaiFENesin-dextromethorphan, LORazepam, morphine injection, ondansetron **OR** ondansetron (ZOFRAN) IV, phenol, sodium chloride flush    Bonnielee Haff M.D on 09/29/2020 at 12:35 PM  To page go to [email or web address] - use universal password  [facility] -  Office  [phone]    Objective:   Vitals:   09/29/20 0500 09/29/20 0523 09/29/20 0739 09/29/20 1142  BP:  119/78 115/82 110/77  Pulse:  84 86 79  Resp:  18    Temp:  97.8 F (36.6 C) 98 F (36.7 C) 97.8 F (36.6 C)  TempSrc:  Oral Oral Oral  SpO2:  95% 93% 94%  Weight: 65.4 kg     Height:        Wt Readings from Last 3 Encounters:  09/29/20 65.4 kg  09/22/20 66.2 kg  09/04/20 69.2 kg     Intake/Output Summary  (Last 24 hours) at 09/29/2020 1235 Last data filed at 09/29/2020 0500 Gross per 24 hour  Intake 971.5 ml  Output 200 ml  Net 771.5 ml     Physical Exam  General appearance: Awake alert.  In no distress Resp: Normal effort at rest.  Good air entry bilaterally.  Few crackles at the bases.  Cardio: S1-S2 is normal regular.  No S3-S4.  No rubs murmurs or bruit GI: Abdomen is soft.  Nontender nondistended.  Bowel sounds are present normal.  No masses organomegaly Extremities: No edema.         Data Review:    CBC Recent Labs  Lab 09/23/20 0500 09/24/20 0109 09/26/20 1021 09/27/20 0604  WBC 10.3 16.1* 13.5* 14.3*  HGB 10.6* 11.6* 12.1 12.3  HCT 33.0* 35.4* 37.4 39.0  PLT 381 430* 404* 380  MCV 92.2 93.4 95.2 94.9  MCH 29.6 30.6 30.8 29.9  MCHC 32.1 32.8 32.4 31.5  RDW 14.9 15.3 15.7* 15.6*  Chemistries  Recent Labs  Lab 09/23/20 0500 09/23/20 0500 09/24/20 0109 09/26/20 1021 09/27/20 0604 09/28/20 0500 09/29/20 0500  NA 141   < > 143 152* 149* 140 134*  K 4.3   < > 4.3 3.4* 3.8 3.6 3.8  CL 108   < > 110 117* 111 108 101  CO2 24   < > 19* 22 25 23 24   GLUCOSE 129*   < > 103* 115* 128* 153* 152*  BUN 23   < > 21 22 18 17 14   CREATININE 0.96   < > 0.84 0.83 0.85 0.80 0.73  CALCIUM 9.0   < > 9.2 8.9 9.2 8.5* 8.4*  MG  --   --   --   --   --  2.3  --   AST 42*  --  42* 30 33  --   --   ALT 32  --  31 28 27   --   --   ALKPHOS 87  --  86 72 77  --   --   BILITOT 0.8  --  1.4* 1.1 1.2  --   --    < > = values in this interval not displayed.    Micro Results No results found for this or any previous visit (from the past 240 hour(s)).  Radiology Reports CT ANGIO HEAD W OR WO CONTRAST  Result Date: 09/24/2020 CLINICAL DATA:  Posterior circulation stroke.  COVID positive. EXAM: CT ANGIOGRAPHY HEAD AND NECK TECHNIQUE: Multidetector CT imaging of the head and neck was performed using the standard protocol during bolus administration of intravenous contrast.  Multiplanar CT image reconstructions and MIPs were obtained to evaluate the vascular anatomy. Carotid stenosis measurements (when applicable) are obtained utilizing NASCET criteria, using the distal internal carotid diameter as the denominator. CONTRAST:  47mL OMNIPAQUE IOHEXOL 350 MG/ML SOLN COMPARISON:  MRI 09/22/2020 FINDINGS: CTA NECK FINDINGS Aortic arch: Visualized portions demonstrate atherosclerosis without significant stenosis or aneurysm. Right carotid system: No evidence of dissection, stenosis (50% or greater) or occlusion. Left carotid system: No evidence of dissection, stenosis (50% or greater) or occlusion. Vertebral arteries: Right dominant. There is abrupt occlusion of the left vertebral artery at the level of C2. Reconstitution immediately before the vertebral artery courses intradural E, which may be from retrograde flow and/or collaterals. Skeleton: Multilevel severe degenerative disc disease in the cervical spine. Other neck: No mass lesion or adenopathy. Upper chest: Peripheral predominant ground-glass opacities in the imaged lung apices, compatible with COVID pneumonia. Review of the MIP images confirms the above findings CTA HEAD FINDINGS Anterior circulation: No significant stenosis, proximal occlusion, aneurysm, or vascular malformation. Posterior circulation: Abrupt inclusion of the distal right P2 PCA (series 5, image 448). a Venous sinuses: As permitted by contrast timing, patent. Additional comments: Partially imaged edema associated with multifocal posterior circulation infarcts that were better characterized on recent MRI. Review of the MIP images confirms the above findings IMPRESSION: 1. 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 2. Partially imaged edema associated with multifocal posterior circulation infarcts that were better characterized on recent MRI. Non-contrast head CT could further evaluate the degree of  edema/mass effect if clinically indicated. 3. Peripheral predominant ground-glass opacities in the imaged lung apices, compatible with COVID pneumonia given the clinical history. These results will be called to the ordering clinician or representative by the Radiologist Assistant, and communication documented in the PACS or [employer]. Electronically  Signed   By: Margaretha Sheffield MD   On: 09/24/2020 09:45   CT ANGIO NECK W OR WO CONTRAST  Result Date: 09/24/2020 CLINICAL DATA:  Posterior circulation stroke.  COVID positive. EXAM: CT ANGIOGRAPHY HEAD AND NECK TECHNIQUE: Multidetector CT imaging of the head and neck was performed using the standard protocol during bolus administration of intravenous contrast. Multiplanar CT image reconstructions and MIPs were obtained to evaluate the vascular anatomy. Carotid stenosis measurements (when applicable) are obtained utilizing NASCET criteria, using the distal internal carotid diameter as the denominator. CONTRAST:  44mL OMNIPAQUE IOHEXOL 350 MG/ML SOLN COMPARISON:  MRI 09/22/2020 FINDINGS: CTA NECK FINDINGS Aortic arch: Visualized portions demonstrate atherosclerosis without significant stenosis or aneurysm. Right carotid system: No evidence of dissection, stenosis (50% or greater) or occlusion. Left carotid system: No evidence of dissection, stenosis (50% or greater) or occlusion. Vertebral arteries: Right dominant. There is abrupt occlusion of the left vertebral artery at the level of C2. Reconstitution immediately before the vertebral artery courses intradural E, which may be from retrograde flow and/or collaterals. Skeleton: Multilevel severe degenerative disc disease in the cervical spine. Other neck: No mass lesion or adenopathy. Upper chest: Peripheral predominant ground-glass opacities in the imaged lung apices, compatible with COVID pneumonia. Review of the MIP images confirms the above findings CTA HEAD FINDINGS Anterior circulation: No significant  stenosis, proximal occlusion, aneurysm, or vascular malformation. Posterior circulation: Abrupt inclusion of the distal right P2 PCA (series 5, image 448). a Venous sinuses: As permitted by contrast timing, patent. Additional comments: Partially imaged edema associated with multifocal posterior circulation infarcts that were better characterized on recent MRI. Review of the MIP images confirms the above findings IMPRESSION: 1. 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 2. Partially imaged edema associated with multifocal posterior circulation infarcts that were better characterized on recent MRI. Non-contrast head CT could further evaluate the degree of edema/mass effect if clinically indicated. 3. Peripheral predominant ground-glass opacities in the imaged lung apices, compatible with COVID pneumonia given the clinical history. These results will be called to the ordering clinician or representative by the Radiologist Assistant, and communication documented in the PACS or [employer]. Electronically Signed   By: Margaretha Sheffield MD   On: 09/24/2020 09:45   MR BRAIN WO CONTRAST  Result Date: 09/22/2020 CLINICAL DATA:  Delirium.  COVID positive EXAM: MRI HEAD WITHOUT CONTRAST TECHNIQUE: Multiplanar, multiecho pulse sequences of the brain and surrounding structures were obtained without intravenous contrast. COMPARISON:  None. FINDINGS: Brain: Extensive acute infarction in the posterior circulation, patchily present throughout the left more than right cerebellum with right cerebellar involvement mainly at the superior cerebellar distribution and left-sided involvement both at the PICA and superior cerebellar arteries. Right more than left PCA involvement involving the bilateral thalamus and right more than left occipital cortex. Right more than left hippocampal region involvement. No anterior circulation infarct is seen. No hemorrhage,  hydrocephalus, or masslike finding. Mild chronic small vessel disease. Cerebral volume loss. Vascular: A right vertebral and basilar flow void or visible. The left vertebral artery could be hypoplastic or diseased. Skull and upper cervical spine: Normal marrow signal. Cervical spine degeneration with C3-4 and C4-5 anterolisthesis Sinuses/Orbits: Bilateral cataract resection. Nasal septal perforation. Other: Progressively motion degraded study ASAP these results will be called to the ordering clinician or representative by the Radiologist Assistant, and communication documented in the PACS or [employer]. IMPRESSION: Multifocal acute infarcts in the  posterior circulation as described. Electronically Signed   By: Monte Fantasia M.D.   On: 09/22/2020 11:45   DG Chest Portable 1 View  Result Date: 09/07/2020 CLINICAL DATA:  Cough, congestion, fever, loss of taste and smell for 1 week EXAM: PORTABLE CHEST 1 VIEW COMPARISON:  01/11/2014 FINDINGS: Single frontal view of the chest demonstrates stable enlarged cardiac silhouette. There is diffuse increased interstitial prominence with bilateral ground-glass consolidation. No effusion or pneumothorax. No acute bony abnormalities. Bilateral shoulder arthroplasties. IMPRESSION: 1. Diffuse interstitial and ground-glass opacities, consistent with bilateral atypical pneumonia given clinical history. Electronically Signed   By: Randa Ngo M.D.   On: 09/01/2020 23:33   DG Swallowing Func-Speech Pathology  Result Date: 09/26/2020 Objective Swallowing Evaluation: Type of Study: MBS-Modified Barium Swallow Study  Patient Details Name: EMELY FAHY MRN: [id number] Date of Birth: [birth year] Today's Date: 09/26/2020 Time: SLP Start Time (ACUTE ONLY): 1412 -SLP Stop Time (ACUTE ONLY): 1429 SLP Time Calculation (min) (ACUTE ONLY): 17 min Past Medical History: Past Medical History: Diagnosis Date . ALLERGIC RHINITIS 06/02/2007 . Allergy  . Arthritis   DDD of cervical,  thoracic, and lumbar spine . Blood transfusion without reported diagnosis  . Cataract   Bilateral removed cateracts . Cervical spondylosis   C5-6 and C6-7.  Pain mgmt as per Dr. Nelva Bush. . Chronic gastritis without bleeding  . Chronic low back pain 02/18/2009  s/p fusion L3-4 through L5-S1.  Pain meds per Dr. Nelva Bush . Chronic renal insufficiency, stage 3 (moderate)   borderline II/III (GFR about 55 ml/min) as of 2021 . DEPRESSION 06/02/2007 . Diverticulosis  . GERD (gastroesophageal reflux disease)  . Heart murmur  . Hiatal hernia  . Hypercholesterolemia  . HYPOTHYROIDISM 06/02/2007  GOITER . IBS (irritable bowel syndrome)  . INSOMNIA 06/02/2007 . Lymphocytic colitis  . Muscle spasms of lower extremity  . Nasal septal perforation   chronic; hx of epistaxis . Normocytic anemia 04/2015  HEME + in ED 08/07/15--endoscopic eval unrevealing.  Iron studies fine-->?Anemia of chronic dz (lymphocytic colitis?).  Hb 02/2020 11.5. . OSTEOPENIA 02/18/2009  Repeat DEXA 07/2014 showed osteoporosis by T score in radius but spine and hip T scores were normal-continue vit D and calcium and repeat DEXA 08/2016 unchanged.  Repeat DEXA 2 yrs. . Peripheral edema   LE's, nonpitting . Renal cyst, acquired, right   1.2 cm, 2021 . Simple hepatic cyst   x 1. 2021 . Trochanteric bursitis of both hips   inj 08/11/20 Past Surgical History: Past Surgical History: Procedure Laterality Date . ABDOMINAL HYSTERECTOMY    for DUB (ovaries are still in) . BACK SURGERY    X 5: fusion of L3-L4 through L5-S1 . CATARACT EXTRACTION Bilateral  . CHOLECYSTECTOMY  2012 . COLONOSCOPY  06/2007; 01/2015  2008 Diverticulosis, o/w normal.  2016 showed lymphocytic colitis with mild diverticulosis: pt was started on oral budesonide at that time. . CT SCAN  09/03/2017  ABDOMEN, HEAD . DEXA  08/16/2014; 09/14/16  Hip and spine ok; radius osteoporotic---but meds not indicated.  Repeat 08/2018 . ESOPHAGOGASTRODUODENOSCOPY  10/2007; 11/27/15; 04/26/20  03/2020: chronic gastritis, H pylori neg,  celiac neg.  +Barretts.  Hyperplastic gastric polyp. Marland Kitchen HIP CLOSED REDUCTION Right 09/20/2018  Procedure: CLOSED REDUCTION HIP;  Surgeon: Paralee Cancel, MD;  Location: [facility];  Service: Orthopedics;  Laterality: Right; . REVERSE SHOULDER ARTHROPLASTY Left 06/09/2019  Procedure: REVERSE SHOULDER ARTHROPLASTY;  Surgeon: Netta Cedars, MD;  Location: [facility];  Service: Orthopedics;  Laterality: Left; . SHOULDER SURGERY Right   \ .  TOTAL HIP ARTHROPLASTY  05/31/2012  Procedure: TOTAL HIP ARTHROPLASTY ANTERIOR APPROACH;  Surgeon: Mauri Pole, MD;  Location: [facility];  Service: Orthopedics;  Laterality: Left; . TOTAL HIP ARTHROPLASTY Right 08/30/2018  Procedure: RIGHT TOTAL HIP ARTHROPLASTY ANTERIOR APPROACH;  Surgeon: Paralee Cancel, MD;  Location: [facility];  Service: Orthopedics;  Laterality: Right;  70 mins HPI: Patient is a 74 y.o. female with PMHx of HLD, hypothyroidism, GERD, lymphocytic colitis, anxiety/depression, chronic back pain on narcotics-presented with several days history of shortness of breath-found to have acute hypoxic respiratory failure secondary to COVID-19 pneumonia.  Patient started to develop some lethargy 9/24, progressive, with no improvement despite holding Xanax and oxycodone, MRI brain significant for acute CVA 9/26 .desat to 83 on oxygen. 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  Swallow eval ordered.  Subjective: pt awake in bed Assessment / Plan / Recommendation [facility] IP CLINICAL IMPRESSIONS 09/26/2020 Clinical Impression Severe oropharyngeal dysphagia marked by oral holding, lingual residue, inefficient posterior propulsion, late swallow initiation, decreased pharyngeal contraction. Initially able to propel nectar and first trial of puree but subsequent attempts were unsuccessful. Pt held head in an extended postion (diagnosed with cervical spinal degeneration with C3-4 and C4-5 anterolisthesis; dry spoon  presentations and verbal cues were not effective. Barium reached her pyriform sinuses for several minutes and was eventually suctioned. Swallow initiated late therefore nectar thick barium dumped to her small pyriform sinus cavities and immediately fell into trachea with immediate cough. Residue in valleculae was max with puree. Recommend continued NPO and ST in attempts to return to po's. Continue oral care.     SLP Visit Diagnosis Dysphagia, oropharyngeal phase (R13.12) Attention and concentration deficit following -- Frontal lobe and executive function deficit following -- Impact on safety and function Severe aspiration risk   [facility] IP TREATMENT RECOMMENDATION 09/26/2020 Treatment Recommendations Therapy as outlined in treatment plan below   Prognosis 09/26/2020 Prognosis for Safe Diet Advancement Fair Barriers to Reach Goals Severity of deficits Barriers/Prognosis Comment -- [facility] IP DIET RECOMMENDATION 09/26/2020 SLP Diet Recommendations NPO Liquid Administration via -- Medication Administration Via alternative means Compensations -- Postural Changes --   [facility] IP OTHER RECOMMENDATIONS 09/26/2020 Recommended Consults -- Oral Care Recommendations Oral care QID Other Recommendations --   [facility] IP FOLLOW UP RECOMMENDATIONS 09/26/2020 Follow up Recommendations Skilled Nursing facility   [facility] IP FREQUENCY AND DURATION 09/26/2020 Speech Therapy Frequency (ACUTE ONLY) min 2x/week Treatment Duration 2 weeks      [facility] IP ORAL PHASE 09/26/2020 Oral Phase Impaired Oral - Pudding Teaspoon -- Oral - Pudding Cup -- Oral - Honey Teaspoon -- Oral - Honey Cup -- Oral - Nectar Teaspoon -- Oral - Nectar Cup Delayed oral transit;Decreased bolus cohesion;Holding of bolus;Reduced posterior propulsion;Lingual/palatal residue Oral - Nectar Straw -- Oral - Thin Teaspoon -- Oral - Thin Cup -- Oral - Thin Straw -- Oral - Puree Holding of bolus;Delayed oral transit;Weak lingual manipulation;Reduced posterior propulsion;Lingual/palatal residue Oral - Mech  Soft -- Oral - Regular -- Oral - Multi-Consistency -- Oral - Pill -- Oral Phase - Comment --  [facility] IP PHARYNGEAL PHASE 09/26/2020 Pharyngeal Phase Impaired Pharyngeal- Pudding Teaspoon -- Pharyngeal -- Pharyngeal- Pudding Cup -- Pharyngeal -- Pharyngeal- Honey Teaspoon -- Pharyngeal -- Pharyngeal- Honey Cup -- Pharyngeal -- Pharyngeal- Nectar Teaspoon -- Pharyngeal -- Pharyngeal- Nectar Cup Delayed swallow initiation-pyriform sinuses;Pharyngeal residue - valleculae;Pharyngeal residue - pyriform Pharyngeal -- Pharyngeal- Nectar Straw -- Pharyngeal -- Pharyngeal- Thin Teaspoon --  Pharyngeal -- Pharyngeal- Thin Cup -- Pharyngeal -- Pharyngeal- Thin Straw -- Pharyngeal -- Pharyngeal- Puree Delayed swallow initiation-vallecula;Reduced pharyngeal peristalsis;Pharyngeal residue - pyriform;Reduced tongue base retraction Pharyngeal -- Pharyngeal- Mechanical Soft -- Pharyngeal -- Pharyngeal- Regular -- Pharyngeal -- Pharyngeal- Multi-consistency -- Pharyngeal -- Pharyngeal- Pill -- Pharyngeal -- Pharyngeal Comment --  [facility] IP CERVICAL ESOPHAGEAL PHASE 09/26/2020 Cervical Esophageal Phase WFL Pudding Teaspoon -- Pudding Cup -- Honey Teaspoon -- Honey Cup -- Nectar Teaspoon -- Nectar Cup -- Nectar Straw -- Thin Teaspoon -- Thin Cup -- Thin Straw -- Puree -- Mechanical Soft -- Regular -- Multi-consistency -- Pill -- Cervical Esophageal Comment -- [place] Siren 09/26/2020, 4:48 PM Orbie Pyo Colvin Caroli.Ed Actor Pager [phone] Office [phone]              ECHOCARDIOGRAM COMPLETE  Result Date: 09/23/2020    ECHOCARDIOGRAM REPORT   Patient Name:   KHALANI NOVOA Date of Exam: 09/23/2020 Medical Rec #:  [id number]       Height:       64.0 in Accession #:    [id number]      Weight:       145.9 lb Date of Birth:  1946-07-11       BSA:          63.711 m Patient Age:    42 years        BP:           139/82 mmHg Patient Gender: F               HR:           84 bpm. Exam Location:  Inpatient Procedure: 2D  Echo, Cardiac Doppler and Color Doppler Indications:    Stroke 434.91 / I163.9  History:        Patient has no prior history of Echocardiogram examinations.                 Signs/Symptoms:Murmur; Risk Factors:Dyslipidemia. COVID-19                 Positive. CKD. GERD. Hypothyroidism.  Sonographer:    Jonelle Sidle Dance Referring Phys: [id number]  1. Left ventricular ejection fraction, by estimation, is 65 to 70%. The left ventricle has normal function. The left ventricle has no regional wall motion abnormalities. There is mild concentric left ventricular hypertrophy. Left ventricular diastolic parameters are consistent with Grade I diastolic dysfunction (impaired relaxation).  2. Right ventricular systolic function is normal. The right ventricular size is normal.  3. Left atrial size was mildly dilated.  4. The mitral valve is normal in structure. Mild mitral valve regurgitation. No evidence of mitral stenosis. Moderate mitral annular calcification.  5. The aortic valve is normal in structure. Aortic valve regurgitation is not visualized. No aortic stenosis is present.  6. The inferior vena cava is normal in size with greater than 50% respiratory variability, suggesting right atrial pressure of 3 mmHg. Comparison(s): No prior Echocardiogram. Conclusion(s)/Recommendation(s): No intracardiac source of embolism detected on this transthoracic study. A transesophageal echocardiogram is recommended to exclude cardiac source of embolism if clinically indicated. FINDINGS  Left Ventricle: Left ventricular ejection fraction, by estimation, is 65 to 70%. The left ventricle has normal function. The left ventricle has no regional wall motion abnormalities. The left ventricular internal cavity size was normal in size. There is  mild concentric left ventricular hypertrophy. Left ventricular diastolic parameters are consistent with Grade I diastolic dysfunction (impaired relaxation). Normal left  ventricular filling  pressure. Right Ventricle: The right ventricular size is normal. No increase in right ventricular wall thickness. Right ventricular systolic function is normal. Left Atrium: Left atrial size was mildly dilated. Right Atrium: Right atrial size was normal in size. Pericardium: There is no evidence of pericardial effusion. Mitral Valve: The mitral valve is normal in structure. There is mild thickening of the mitral valve leaflet(s). There is mild calcification of the mitral valve leaflet(s). Moderate mitral annular calcification. Mild mitral valve regurgitation. No evidence of mitral valve stenosis. Tricuspid Valve: The tricuspid valve is normal in structure. Tricuspid valve regurgitation is mild . No evidence of tricuspid stenosis. Aortic Valve: The aortic valve is normal in structure. Aortic valve regurgitation is not visualized. No aortic stenosis is present. Pulmonic Valve: The pulmonic valve was normal in structure. Pulmonic valve regurgitation is not visualized. No evidence of pulmonic stenosis. Aorta: The aortic root is normal in size and structure. Venous: The inferior vena cava is normal in size with greater than 50% respiratory variability, suggesting right atrial pressure of 3 mmHg. IAS/Shunts: No atrial level shunt detected by color flow Doppler.  LEFT VENTRICLE PLAX 2D LVIDd:         3.80 cm  Diastology LVIDs:         2.40 cm  LV e' lateral:   9.46 cm/s LV PW:         1.10 cm  LV E/e' lateral: 8.7 LV IVS:        1.10 cm LVOT diam:     2.10 cm LV SV:         105 LV SV Index:   61 LVOT Area:     3.46 cm  RIGHT VENTRICLE             IVC RV Basal diam:  2.00 cm     IVC diam: 1.20 cm RV S prime:     13.90 cm/s TAPSE (M-mode): 1.5 cm LEFT ATRIUM             Index       RIGHT ATRIUM          Index LA diam:        4.00 cm 2.34 cm/m  RA Area:     8.10 cm LA Vol (A2C):   48.1 ml 28.11 ml/m RA Volume:   13.00 ml 7.60 ml/m LA Vol (A4C):   34.5 ml 20.16 ml/m LA Biplane Vol: 42.3 ml 24.72 ml/m  AORTIC VALVE LVOT  Vmax:   148.00 cm/s LVOT Vmean:  98.300 cm/s LVOT VTI:    0.302 m  AORTA Ao Root diam: 3.20 cm Ao Asc diam:  3.10 cm MITRAL VALVE MV Area (PHT): 2.53 cm     SHUNTS MV Decel Time: 300 msec     Systemic VTI:  0.30 m MV E velocity: 81.90 cm/s   Systemic Diam: 2.10 cm MV A velocity: 133.00 cm/s MV E/A ratio:  0.62 Ena Dawley MD Electronically signed by Ena Dawley MD Signature Date/Time: 09/23/2020/5:19:20 PM    Final

## 2020-09-30 DIAGNOSIS — J1282 Pneumonia due to Coronavirus disease 2019: Secondary | ICD-10-CM | POA: Diagnosis not present

## 2020-09-30 DIAGNOSIS — J9601 Acute respiratory failure with hypoxia: Secondary | ICD-10-CM | POA: Diagnosis not present

## 2020-09-30 DIAGNOSIS — U071 COVID-19: Secondary | ICD-10-CM | POA: Diagnosis not present

## 2020-09-30 LAB — GLUCOSE, CAPILLARY
Glucose-Capillary: 102 mg/dL — ABNORMAL HIGH (ref 70–99)
Glucose-Capillary: 203 mg/dL — ABNORMAL HIGH (ref 70–99)
Glucose-Capillary: 188 mg/dL — ABNORMAL HIGH (ref 70–99)
Glucose-Capillary: 158 mg/dL — ABNORMAL HIGH (ref 70–99)

## 2020-09-30 LAB — BASIC METABOLIC PANEL
Anion gap: 3 — ABNORMAL LOW (ref 5–15)
BUN: 15 mg/dL (ref 8–23)
CO2: 30 mmol/L (ref 22–32)
Calcium: 8.1 mg/dL — ABNORMAL LOW (ref 8.9–10.3)
Chloride: 98 mmol/L (ref 98–111)
Creatinine, Ser: 0.7 mg/dL (ref 0.44–1.00)
GFR calc Af Amer: >60 mL/min (ref >60)
GFR calc non Af Amer: >60 mL/min (ref >60)
Glucose, Bld: 136 mg/dL — ABNORMAL HIGH (ref 70–99)
Potassium: 4.1 mmol/L (ref 3.5–5.1)
Sodium: 131 mmol/L — ABNORMAL LOW (ref 135–145)

## 2020-09-30 LAB — MAGNESIUM: Magnesium: 1.7 mg/dL (ref 1.7–2.4)

## 2020-09-30 MED ORDER — FREE WATER
100.0000 mL | Freq: Four times a day (QID) | Status: DC
  Administered 2020-09-30 – 2020-10-07 (×27): 100 mL

## 2020-09-30 MED ORDER — MAGNESIUM SULFATE 2 GM/50ML IV SOLN
2.0000 g | Freq: Once | INTRAVENOUS | Status: AC
  Administered 2020-09-30: 2 g via INTRAVENOUS
  Filled 2020-09-30: qty 50

## 2020-09-30 NOTE — Progress Notes (Signed)
PROGRESS NOTE                                                                                                                                                                                                             Patient Demographics:    April Brewer, is a 74 y.o. female, DOB - [birth year], NKN:[id number]  Outpatient Primary MD for the patient is McGowen, Adrian Blackwater, MD   Admit date - 09/04/2020   LOS - 27  Chief Complaint  Patient presents with  . Shortness of Breath       Brief Narrative: Patient is a 74 y.o. female with PMHx of HLD, hypothyroidism, GERD, lymphocytic colitis, anxiety/depression, chronic back pain on narcotics-presented with several days history of shortness of breath-found to have acute hypoxic respiratory failure secondary to COVID-19 pneumonia.  Patient was admitted to the hospitalist service-started on COVID-19 treatment-unfortunately further hospital course complicated by acute CVA.  See below for further details.    COVID-19 vaccinated status: Unvaccinated  Significant Events: 9/18>> Admit to [facility] for severe hypoxia due to COVID-19 pneumonia 9/26>> worsening lethargy-MRI brain +ve for acute CVA  Significant studies: 9/17>>Chest x-ray: Diffuse interstitial and groundglass opacities 9/26>> MRI brain: Multifocal acute infarcts in the posterior circulation. 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  Continues to have dysarthria.  No shortness of breath.  No chest pain.     Assessment  & Plan :   Acute Hypoxic Resp Failure due to Covid 19 Viral pneumonia Had severe hypoxemia on initial presentation requiring 15 L of HFNC. Patient has completed course of Remdesivir. Remains on steroids which is being tapered down. Patient was also on baricitinib which had to be held due to dysphagia from acute stroke. Continue incentive spirometry and mobilization. D-dimer improved to 0.81. CRP has been improving as well. Patient's respiratory status has been  improving.  She has been weaned down to 3-1/2 L by nasal cannula.  Saturating in the 90s.  Overall she remains stable.  Acute ischemic CVA Felt to be due to hypercoagulable state in the setting of COVID-19. Telemetry without any arrhythmias. Echo without any obvious embolic source.   Followed by PT OT and SLP. Remains high aspiration risk Cortrak feeding tube was placed and tube feedings were initiated.  She will need to be on aspirin and Plavix for 3 months followed by aspirin alone.    Oropharyngeal dysphagia Did not do well with the modified barium swallow on 9/30.  Cortrak tube was placed. Tube feedings initiated.  Speech therapy continues to follow.  PSVT Noted on tele. Patient asymptomatic. Recent echo reviewed. TSH was normal, 1.86 on 8/30.  Continue metoprolol.  Monitor electrolytes closely.  Keep potassium closer to 4.0.    Hypernatremia Likely due to free water deficit from lack of oral intake.  Sodium noted to be lower at 131.  D5 was discontinued yesterday.  The amount of free water she was receiving was also decreased.  We will decrease it further today.    Hypokalemia Potassium 4.1. Magnesium noted to be 1.7 today.  Will supplement.  Transaminitis Improved-secondary to COVID-19.  HLD LDL noted to be 72.  Continue statin.  Hypothyroidism Continue  Synthroid.  Anxiety/depression Seems to be stable.   Her medications including Xanax lamotrigine and Zoloft are currently on hold.    Chronic back pain Patient on chronic narcotics at home.  Goals of care:  Done by previous rounding physician. Currently DNR.   GI prophylaxis:PPI  Consults: Neurology  Family Communication  :  Son (Thomas [phone]).  Son being updated daily.    Code Status :   DNR  Diet :  Diet Order    None       Disposition Plan  : CIR is recommended by physical therapy. She will need to be at least 21 days out from her infection for her to be able to go to CIR. Positive test was on 9/17.  Will complete 3 weeks on October 7.  Status is: Inpatient  Remains inpatient appropriate because:Inpatient level of care appropriate due to severity of illness   Dispo: The patient is from: Home              Anticipated d/c is to: SNF versus CIR.              Anticipated d/c date is: > 3 days              Patient currently is not medically stable to d/c.    Antimicorbials  :    Anti-infectives (From admission, onward)   Start     Dose/Rate Route Frequency Ordered Stop   09/14/20 2200  cefTRIAXone (ROCEPHIN) 2 g in sodium chloride 0.9 % 100 mL IVPB  Status:  Discontinued        2 g 200 mL/hr over 30 Minutes Intravenous Every 24 hours 09/14/20 2043 09/15/20 1102   09/14/20 2200  azithromycin (ZITHROMAX) 500 mg in sodium chloride 0.9 % 250 mL IVPB  Status:  Discontinued        500 mg 250 mL/hr over 60 Minutes Intravenous Every 24 hours 09/14/20 2043 09/15/20 1102   09/14/20 1000  remdesivir 100 mg in sodium chloride 0.9 % 100 mL IVPB       "Followed by" Linked Group Details   100 mg 200 mL/hr over 30 Minutes Intravenous  Daily 09/21/2020 2306 09/17/20 0907   09/17/2020 2330  remdesivir 100 mg in sodium chloride 0.9 % 100 mL IVPB       "Followed by" Linked Group Details   100 mg 200 mL/hr over 30 Minutes Intravenous Every 30 min 09/05/2020 2306 09/14/20 0033       Inpatient Medications  Scheduled Meds: . acidophilus  1 capsule Oral Daily  . albuterol  2 puff Inhalation Q6H  . aspirin  325 mg Per Tube Daily  . atorvastatin  10 mg Per Tube Daily  . clopidogrel  75 mg Per Tube Daily  . enoxaparin (LOVENOX) injection  40 mg Subcutaneous Q24H  . famotidine  20 mg Per Tube Daily  . feeding supplement (ENSURE ENLIVE)  237 mL Oral BID BM  . feeding supplement (PROSource TF)  45 mL Per Tube BID  . free water  100 mL Per Tube Q4H  . influenza vaccine adjuvanted  0.5 mL Intramuscular Tomorrow-1000  . lamoTRIgine  100 mg Per Tube Daily  . levothyroxine  100 mcg Per Tube Q0600  . mouth rinse  15 mL Mouth Rinse BID  . methylPREDNISolone (SOLU-MEDROL) injection  20 mg Intravenous Daily  . metoprolol tartrate  25 mg Per Tube BID  . sertraline  100 mg Oral BID  . sodium chloride flush  10-40 mL Intracatheter Q12H  . sodium chloride flush  3 mL Intravenous Q12H   Continuous Infusions: . sodium chloride Stopped (09/15/20 0004)  . sodium chloride Stopped (09/14/20 2233)  . feeding supplement (OSMOLITE 1.5 CAL) 1,000 mL (09/29/20 0042)   PRN Meds:.sodium chloride, sodium chloride, acetaminophen, chlorpheniramine-HYDROcodone, guaiFENesin-dextromethorphan, LORazepam, morphine injection, ondansetron **OR** ondansetron (ZOFRAN) IV, phenol, sodium chloride flush    Bonnielee Haff M.D on 09/30/2020 at 10:56 AM  To page go to [email or web address] - use universal password  [facility] -  Office  [phone]    Objective:   Vitals:   09/30/20 0335 09/30/20 0500 09/30/20 0743 09/30/20 0917  BP: 117/79   123/70  Pulse: 85   95  Resp: 19  17   Temp: 98.8 F (37.1 C)  98.8 F (37.1 C)   TempSrc: Oral  Oral   SpO2: 93%     Weight:  65.2 kg    Height:        Wt Readings from Last 3 Encounters:  09/30/20 65.2 kg  09/22/20 66.2 kg  09/04/20 69.2 kg     Intake/Output Summary (Last 24 hours) at 09/30/2020 1056 Last data filed at 09/30/2020  0900 Gross per 24 hour  Intake 3490 ml  Output 675 ml  Net 2815 ml     Physical Exam  General appearance: Awake alert.  In no distress Resp: Normal effort.  Coarse breath sound bilaterally.  Few crackles in the bases.  No wheezing or rhonchi.  Cardio: S1-S2 is normal regular.  No S3-S4.  No rubs murmurs or bruit GI: Abdomen is soft.  Nontender nondistended.  Bowel sounds are present normal.  No masses organomegaly Extremities: No edema.        Data Review:    CBC Recent Labs  Lab 09/24/20 0109 09/26/20 1021 09/27/20 0604  WBC 16.1* 13.5* 14.3*  HGB 11.6* 12.1 12.3  HCT 35.4* 37.4 39.0  PLT 430* 404* 380  MCV 93.4 95.2 94.9  MCH 30.6 30.8 29.9  MCHC 32.8 32.4 31.5  RDW 15.3 15.7* 15.6*    Chemistries  Recent Labs  Lab 09/24/20 0109 09/24/20 0109 09/26/20 1021 09/27/20 0604 09/28/20 0500 09/29/20  0500 09/30/20 0500  NA 143   < > 152* 149* 140 134* 131*  K 4.3   < > 3.4* 3.8 3.6 3.8 4.1  CL 110   < > 117* 111 108 101 98  CO2 19*   < > 22 25 23 24 30   GLUCOSE 103*   < > 115* 128* 153* 152* 136*  BUN 21   < > 22 18 17 14 15   CREATININE 0.84   < > 0.83 0.85 0.80 0.73 0.70  CALCIUM 9.2   < > 8.9 9.2 8.5* 8.4* 8.1*  MG  --   --   --   --  2.3  --  1.7  AST 42*  --  30 33  --   --   --   ALT 31  --  28 27  --   --   --   ALKPHOS 86  --  72 77  --   --   --   BILITOT 1.4*  --  1.1 1.2  --   --   --    < > = values in this interval not displayed.    Micro Results No results found for this or any previous visit (from the past 240 hour(s)).  Radiology Reports CT ANGIO HEAD W OR WO CONTRAST  Result Date: 09/24/2020 CLINICAL DATA:  Posterior circulation stroke.  COVID positive. EXAM: CT ANGIOGRAPHY HEAD AND NECK TECHNIQUE: Multidetector CT imaging of the head and neck was performed using the standard protocol during bolus administration of intravenous contrast. Multiplanar CT image reconstructions and MIPs were obtained to evaluate the vascular anatomy. Carotid  stenosis measurements (when applicable) are obtained utilizing NASCET criteria, using the distal internal carotid diameter as the denominator. CONTRAST:  60mL OMNIPAQUE IOHEXOL 350 MG/ML SOLN COMPARISON:  MRI 09/22/2020 FINDINGS: CTA NECK FINDINGS Aortic arch: Visualized portions demonstrate atherosclerosis without significant stenosis or aneurysm. Right carotid system: No evidence of dissection, stenosis (50% or greater) or occlusion. Left carotid system: No evidence of dissection, stenosis (50% or greater) or occlusion. Vertebral arteries: Right dominant. There is abrupt occlusion of the left vertebral artery at the level of C2. Reconstitution immediately before the vertebral artery courses intradural E, which may be from retrograde flow and/or collaterals. Skeleton: Multilevel severe degenerative disc disease in the cervical spine. Other neck: No mass lesion or adenopathy. Upper chest: Peripheral predominant ground-glass opacities in the imaged lung apices, compatible with COVID pneumonia. Review of the MIP images confirms the above findings CTA HEAD FINDINGS Anterior circulation: No significant stenosis, proximal occlusion, aneurysm, or vascular malformation. Posterior circulation: Abrupt inclusion of the distal right P2 PCA (series 5, image 448). a Venous sinuses: As permitted by contrast timing, patent. Additional comments: Partially imaged edema associated with multifocal posterior circulation infarcts that were better characterized on recent MRI. Review of the MIP images confirms the above findings IMPRESSION: 1. 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 2. Partially imaged edema associated with multifocal posterior circulation infarcts that were better characterized on recent MRI. Non-contrast head CT could further evaluate the degree of edema/mass effect if clinically indicated. 3. Peripheral predominant ground-glass opacities in the imaged lung  apices, compatible with COVID pneumonia given the clinical history. These results will be called to the ordering clinician or representative by the Radiologist Assistant, and communication documented in the PACS or [employer]. Electronically Signed   By: Margaretha Sheffield MD   On:  09/24/2020 09:45   CT ANGIO NECK W OR WO CONTRAST  Result Date: 09/24/2020 CLINICAL DATA:  Posterior circulation stroke.  COVID positive. EXAM: CT ANGIOGRAPHY HEAD AND NECK TECHNIQUE: Multidetector CT imaging of the head and neck was performed using the standard protocol during bolus administration of intravenous contrast. Multiplanar CT image reconstructions and MIPs were obtained to evaluate the vascular anatomy. Carotid stenosis measurements (when applicable) are obtained utilizing NASCET criteria, using the distal internal carotid diameter as the denominator. CONTRAST:  81mL OMNIPAQUE IOHEXOL 350 MG/ML SOLN COMPARISON:  MRI 09/22/2020 FINDINGS: CTA NECK FINDINGS Aortic arch: Visualized portions demonstrate atherosclerosis without significant stenosis or aneurysm. Right carotid system: No evidence of dissection, stenosis (50% or greater) or occlusion. Left carotid system: No evidence of dissection, stenosis (50% or greater) or occlusion. Vertebral arteries: Right dominant. There is abrupt occlusion of the left vertebral artery at the level of C2. Reconstitution immediately before the vertebral artery courses intradural E, which may be from retrograde flow and/or collaterals. Skeleton: Multilevel severe degenerative disc disease in the cervical spine. Other neck: No mass lesion or adenopathy. Upper chest: Peripheral predominant ground-glass opacities in the imaged lung apices, compatible with COVID pneumonia. Review of the MIP images confirms the above findings CTA HEAD FINDINGS Anterior circulation: No significant stenosis, proximal occlusion, aneurysm, or vascular malformation. Posterior circulation: Abrupt inclusion of the  distal right P2 PCA (series 5, image 448). a Venous sinuses: As permitted by contrast timing, patent. Additional comments: Partially imaged edema associated with multifocal posterior circulation infarcts that were better characterized on recent MRI. Review of the MIP images confirms the above findings IMPRESSION: 1. 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 2. Partially imaged edema associated with multifocal posterior circulation infarcts that were better characterized on recent MRI. Non-contrast head CT could further evaluate the degree of edema/mass effect if clinically indicated. 3. Peripheral predominant ground-glass opacities in the imaged lung apices, compatible with COVID pneumonia given the clinical history. These results will be called to the ordering clinician or representative by the Radiologist Assistant, and communication documented in the PACS or [employer]. Electronically Signed   By: Margaretha Sheffield MD   On: 09/24/2020 09:45   MR BRAIN WO CONTRAST  Result Date: 09/22/2020 CLINICAL DATA:  Delirium.  COVID positive EXAM: MRI HEAD WITHOUT CONTRAST TECHNIQUE: Multiplanar, multiecho pulse sequences of the brain and surrounding structures were obtained without intravenous contrast. COMPARISON:  None. FINDINGS: Brain: Extensive acute infarction in the posterior circulation, patchily present throughout the left more than right cerebellum with right cerebellar involvement mainly at the superior cerebellar distribution and left-sided involvement both at the PICA and superior cerebellar arteries. Right more than left PCA involvement involving the bilateral thalamus and right more than left occipital cortex. Right more than left hippocampal region involvement. No anterior circulation infarct is seen. No hemorrhage, hydrocephalus, or masslike finding. Mild chronic small vessel disease. Cerebral volume loss. Vascular: A right vertebral and  basilar flow void or visible. The left vertebral artery could be hypoplastic or diseased. Skull and upper cervical spine: Normal marrow signal. Cervical spine degeneration with C3-4 and C4-5 anterolisthesis Sinuses/Orbits: Bilateral cataract resection. Nasal septal perforation. Other: Progressively motion degraded study ASAP these results will be called to the ordering clinician or representative by the Radiologist Assistant, and communication documented in the PACS or [employer]. 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 Electronically Signed   By: Angelica Chessman  Watts M.D.   On: 09/22/2020 11:45   DG Chest Portable 1 View  Result Date: 08/28/2020 CLINICAL DATA:  Cough, congestion, fever, loss of taste and smell for 1 week EXAM: PORTABLE CHEST 1 VIEW COMPARISON:  01/11/2014 FINDINGS: Single frontal view of the chest demonstrates stable enlarged cardiac silhouette. There is diffuse increased interstitial prominence with bilateral ground-glass consolidation. No effusion or pneumothorax. No acute bony abnormalities. Bilateral shoulder arthroplasties. IMPRESSION: 1. Diffuse interstitial and ground-glass opacities, consistent with bilateral atypical pneumonia given clinical history. Electronically Signed   By: Randa Ngo M.D.   On: 09/12/2020 23:33   DG Swallowing Func-Speech Pathology  Result Date: 09/26/2020 Objective Swallowing Evaluation: Type of Study: MBS-Modified Barium Swallow Study  Patient Details Name: BRICEIDA RASBERRY MRN: [id number] Date of Birth: 05/13/1946 Today's Date: 09/26/2020 Time: SLP Start Time (ACUTE ONLY): 1412 -SLP Stop Time (ACUTE ONLY): 1429 SLP Time Calculation (min) (ACUTE ONLY): 17 min Past Medical History: Past Medical History: Diagnosis Date . ALLERGIC RHINITIS 06/02/2007 . Allergy  . Arthritis   DDD of cervical, thoracic, and lumbar spine . Blood transfusion without reported diagnosis  . Cataract   Bilateral removed cateracts . Cervical  spondylosis   C5-6 and C6-7.  Pain mgmt as per Dr. Nelva Bush. . Chronic gastritis without bleeding  . Chronic low back pain 02/18/2009  s/p fusion L3-4 through L5-S1.  Pain meds per Dr. Nelva Bush . Chronic renal insufficiency, stage 3 (moderate)   borderline II/III (GFR about 55 ml/min) as of 2021 . DEPRESSION 06/02/2007 . Diverticulosis  . GERD (gastroesophageal reflux disease)  . Heart murmur  . Hiatal hernia  . Hypercholesterolemia  . HYPOTHYROIDISM 06/02/2007  GOITER . IBS (irritable bowel syndrome)  . INSOMNIA 06/02/2007 . Lymphocytic colitis  . Muscle spasms of lower extremity  . Nasal septal perforation   chronic; hx of epistaxis . Normocytic anemia 04/2015  HEME + in ED 08/07/15--endoscopic eval unrevealing.  Iron studies fine-->?Anemia of chronic dz (lymphocytic colitis?).  Hb 02/2020 11.5. . OSTEOPENIA 02/18/2009  Repeat DEXA 07/2014 showed osteoporosis by T score in radius but spine and hip T scores were normal-continue vit D and calcium and repeat DEXA 08/2016 unchanged.  Repeat DEXA 2 yrs. . Peripheral edema   LE's, nonpitting . Renal cyst, acquired, right   1.2 cm, 2021 . Simple hepatic cyst   x 1. 2021 . Trochanteric bursitis of both hips   inj 08/11/20 Past Surgical History: Past Surgical History: Procedure Laterality Date . ABDOMINAL HYSTERECTOMY    for DUB (ovaries are still in) . BACK SURGERY    X 5: fusion of L3-L4 through L5-S1 . CATARACT EXTRACTION Bilateral  . CHOLECYSTECTOMY  2012 . COLONOSCOPY  06/2007; 01/2015  2008 Diverticulosis, o/w normal.  2016 showed lymphocytic colitis with mild diverticulosis: pt was started on oral budesonide at that time. . CT SCAN  09/03/2017  ABDOMEN, HEAD . DEXA  08/16/2014; 09/14/16  Hip and spine ok; radius osteoporotic---but meds not indicated.  Repeat 08/2018 . ESOPHAGOGASTRODUODENOSCOPY  10/2007; 11/27/15; 04/26/20  03/2020: chronic gastritis, H pylori neg, celiac neg.  +Barretts.  Hyperplastic gastric polyp. Marland Kitchen HIP CLOSED REDUCTION Right 09/20/2018  Procedure: CLOSED REDUCTION HIP;   Surgeon: Paralee Cancel, MD;  Location: [facility];  Service: Orthopedics;  Laterality: Right; . REVERSE SHOULDER ARTHROPLASTY Left 06/09/2019  Procedure: REVERSE SHOULDER ARTHROPLASTY;  Surgeon: Netta Cedars, MD;  Location: [facility];  Service: Orthopedics;  Laterality: Left; . SHOULDER SURGERY Right   \ . TOTAL HIP ARTHROPLASTY  05/31/2012  Procedure: TOTAL HIP ARTHROPLASTY  ANTERIOR APPROACH;  Surgeon: Mauri Pole, MD;  Location: [facility];  Service: Orthopedics;  Laterality: Left; . TOTAL HIP ARTHROPLASTY Right 08/30/2018  Procedure: RIGHT TOTAL HIP ARTHROPLASTY ANTERIOR APPROACH;  Surgeon: Paralee Cancel, MD;  Location: [facility];  Service: Orthopedics;  Laterality: Right;  70 mins HPI: Patient is a 74 y.o. female with PMHx of HLD, hypothyroidism, GERD, lymphocytic colitis, anxiety/depression, chronic back pain on narcotics-presented with several days history of shortness of breath-found to have acute hypoxic respiratory failure secondary to COVID-19 pneumonia.  Patient started to develop some lethargy 9/24, progressive, with no improvement despite holding Xanax and oxycodone, MRI brain significant for acute CVA 9/26 .desat to 83 on oxygen. 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  Swallow eval ordered.  Subjective: pt awake in bed Assessment / Plan / Recommendation [facility] IP CLINICAL IMPRESSIONS 09/26/2020 Clinical Impression Severe oropharyngeal dysphagia marked by oral holding, lingual residue, inefficient posterior propulsion, late swallow initiation, decreased pharyngeal contraction. Initially able to propel nectar and first trial of puree but subsequent attempts were unsuccessful. Pt held head in an extended postion (diagnosed with cervical spinal degeneration with C3-4 and C4-5 anterolisthesis; dry spoon presentations and verbal cues were not effective. Barium reached her pyriform sinuses for several minutes and was eventually suctioned.  Swallow initiated late therefore nectar thick barium dumped to her small pyriform sinus cavities and immediately fell into trachea with immediate cough. Residue in valleculae was max with puree. Recommend continued NPO and ST in attempts to return to po's. Continue oral care.     SLP Visit Diagnosis Dysphagia, oropharyngeal phase (R13.12) Attention and concentration deficit following -- Frontal lobe and executive function deficit following -- Impact on safety and function Severe aspiration risk   CH[facility] IP TREATMENT RECOMMENDATION 09/26/2020 Treatment Recommendations Therapy as outlined in treatment plan below   Prognosis 09/26/2020 Prognosis for Safe Diet Advancement Fair Barriers to Reach Goals Severity of deficits Barriers/Prognosis Comment -- [facility] IP DIET RECOMMENDATION 09/26/2020 SLP Diet Recommendations NPO Liquid Administration via -- Medication Administration Via alternative means Compensations -- Postural Changes --   [facility] IP OTHER RECOMMENDATIONS 09/26/2020 Recommended Consults -- Oral Care Recommendations Oral care QID Other Recommendations --   [facility] IP FOLLOW UP RECOMMENDATIONS 09/26/2020 Follow up Recommendations Skilled Nursing facility   [facility] IP FREQUENCY AND DURATION 09/26/2020 Speech Therapy Frequency (ACUTE ONLY) min 2x/week Treatment Duration 2 weeks      [facility] IP ORAL PHASE 09/26/2020 Oral Phase Impaired Oral - Pudding Teaspoon -- Oral - Pudding Cup -- Oral - Honey Teaspoon -- Oral - Honey Cup -- Oral - Nectar Teaspoon -- Oral - Nectar Cup Delayed oral transit;Decreased bolus cohesion;Holding of bolus;Reduced posterior propulsion;Lingual/palatal residue Oral - Nectar Straw -- Oral - Thin Teaspoon -- Oral - Thin Cup -- Oral - Thin Straw -- Oral - Puree Holding of bolus;Delayed oral transit;Weak lingual manipulation;Reduced posterior propulsion;Lingual/palatal residue Oral - Mech Soft -- Oral - Regular -- Oral - Multi-Consistency -- Oral - Pill -- Oral Phase - Comment --  [facility] IP PHARYNGEAL PHASE 09/26/2020  Pharyngeal Phase Impaired Pharyngeal- Pudding Teaspoon -- Pharyngeal -- Pharyngeal- Pudding Cup -- Pharyngeal -- Pharyngeal- Honey Teaspoon -- Pharyngeal -- Pharyngeal- Honey Cup -- Pharyngeal -- Pharyngeal- Nectar Teaspoon -- Pharyngeal -- Pharyngeal- Nectar Cup Delayed swallow initiation-pyriform sinuses;Pharyngeal residue - valleculae;Pharyngeal residue - pyriform Pharyngeal -- Pharyngeal- Nectar Straw -- Pharyngeal -- Pharyngeal- Thin Teaspoon -- Pharyngeal -- Pharyngeal- Thin Cup -- Pharyngeal -- Pharyngeal-  Thin Straw -- Pharyngeal -- Pharyngeal- Puree Delayed swallow initiation-vallecula;Reduced pharyngeal peristalsis;Pharyngeal residue - pyriform;Reduced tongue base retraction Pharyngeal -- Pharyngeal- Mechanical Soft -- Pharyngeal -- Pharyngeal- Regular -- Pharyngeal -- Pharyngeal- Multi-consistency -- Pharyngeal -- Pharyngeal- Pill -- Pharyngeal -- Pharyngeal Comment --  [facility] IP CERVICAL ESOPHAGEAL PHASE 09/26/2020 Cervical Esophageal Phase WFL Pudding Teaspoon -- Pudding Cup -- Honey Teaspoon -- Honey Cup -- Nectar Teaspoon -- Nectar Cup -- Nectar Straw -- Thin Teaspoon -- Thin Cup -- Thin Straw -- Puree -- Mechanical Soft -- Regular -- Multi-consistency -- Pill -- Cervical Esophageal Comment -- [place] Siren 09/26/2020, 4:48 PM Orbie Pyo Colvin Caroli.Ed Actor Pager [phone] Office [phone]              ECHOCARDIOGRAM COMPLETE  Result Date: 09/23/2020    ECHOCARDIOGRAM REPORT   Patient Name:   CHYNNA BUERKLE Date of Exam: 09/23/2020 Medical Rec #:  [id number]       Height:       64.0 in Accession #:    [id number]      Weight:       145.9 lb Date of Birth:  Sep 16, 1946       BSA:          62.711 m Patient Age:    49 years        BP:           139/82 mmHg Patient Gender: F               HR:           84 bpm. Exam Location:  Inpatient Procedure: 2D Echo, Cardiac Doppler and Color Doppler Indications:    Stroke 434.91 / I163.9  History:        Patient has no prior history of  Echocardiogram examinations.                 Signs/Symptoms:Murmur; Risk Factors:Dyslipidemia. COVID-19                 Positive. CKD. GERD. Hypothyroidism.  Sonographer:    Jonelle Sidle Dance Referring Phys: [id number] [place]  1. Left ventricular ejection fraction, by estimation, is 65 to 70%. The left ventricle has normal function. The left ventricle has no regional wall motion abnormalities. There is mild concentric left ventricular hypertrophy. Left ventricular diastolic parameters are consistent with Grade I diastolic dysfunction (impaired relaxation).  2. Right ventricular systolic function is normal. The right ventricular size is normal.  3. Left atrial size was mildly dilated.  4. The mitral valve is normal in structure. Mild mitral valve regurgitation. No evidence of mitral stenosis. Moderate mitral annular calcification.  5. The aortic valve is normal in structure. Aortic valve regurgitation is not visualized. No aortic stenosis is present.  6. The inferior vena cava is normal in size with greater than 50% respiratory variability, suggesting right atrial pressure of 3 mmHg. Comparison(s): No prior Echocardiogram. Conclusion(s)/Recommendation(s): No intracardiac source of embolism detected on this transthoracic study. A transesophageal echocardiogram is recommended to exclude cardiac source of embolism if clinically indicated. FINDINGS  Left Ventricle: Left ventricular ejection fraction, by estimation, is 65 to 70%. The left ventricle has normal function. The left ventricle has no regional wall motion abnormalities. The left ventricular internal cavity size was normal in size. There is  mild concentric left ventricular hypertrophy. Left ventricular diastolic parameters are consistent with Grade I diastolic dysfunction (impaired relaxation). Normal left ventricular filling pressure. Right Ventricle: The right ventricular size  is normal. No increase in right ventricular wall thickness. Right  ventricular systolic function is normal. Left Atrium: Left atrial size was mildly dilated. Right Atrium: Right atrial size was normal in size. Pericardium: There is no evidence of pericardial effusion. Mitral Valve: The mitral valve is normal in structure. There is mild thickening of the mitral valve leaflet(s). There is mild calcification of the mitral valve leaflet(s). Moderate mitral annular calcification. Mild mitral valve regurgitation. No evidence of mitral valve stenosis. Tricuspid Valve: The tricuspid valve is normal in structure. Tricuspid valve regurgitation is mild . No evidence of tricuspid stenosis. Aortic Valve: The aortic valve is normal in structure. Aortic valve regurgitation is not visualized. No aortic stenosis is present. Pulmonic Valve: The pulmonic valve was normal in structure. Pulmonic valve regurgitation is not visualized. No evidence of pulmonic stenosis. Aorta: The aortic root is normal in size and structure. Venous: The inferior vena cava is normal in size with greater than 50% respiratory variability, suggesting right atrial pressure of 3 mmHg. IAS/Shunts: No atrial level shunt detected by color flow Doppler.  LEFT VENTRICLE PLAX 2D LVIDd:         3.80 cm  Diastology LVIDs:         2.40 cm  LV e' lateral:   9.46 cm/s LV PW:         1.10 cm  LV E/e' lateral: 8.7 LV IVS:        1.10 cm LVOT diam:     2.10 cm LV SV:         105 LV SV Index:   61 LVOT Area:     3.46 cm  RIGHT VENTRICLE             IVC RV Basal diam:  2.00 cm     IVC diam: 1.20 cm RV S prime:     13.90 cm/s TAPSE (M-mode): 1.5 cm LEFT ATRIUM             Index       RIGHT ATRIUM          Index LA diam:        4.00 cm 2.34 cm/m  RA Area:     8.10 cm LA Vol (A2C):   48.1 ml 28.11 ml/m RA Volume:   13.00 ml 7.60 ml/m LA Vol (A4C):   34.5 ml 20.16 ml/m LA Biplane Vol: 42.3 ml 24.72 ml/m  AORTIC VALVE LVOT Vmax:   148.00 cm/s LVOT Vmean:  98.300 cm/s LVOT VTI:    0.302 m  AORTA Ao Root diam: 3.20 cm Ao Asc diam:  3.10 cm  MITRAL VALVE MV Area (PHT): 2.53 cm     SHUNTS MV Decel Time: 300 msec     Systemic VTI:  0.30 m MV E velocity: 81.90 cm/s   Systemic Diam: 2.10 cm MV A velocity: 133.00 cm/s MV E/A ratio:  0.62 Ena Dawley MD Electronically signed by Ena Dawley MD Signature Date/Time: 09/23/2020/5:19:20 PM    Final

## 2020-09-30 NOTE — Progress Notes (Signed)
Occupational Therapy Treatment Patient Details Name: April Brewer MRN: [id number] DOB: 07/20/1946 Today's Date: 09/30/2020    History of present illness Pt is 74 y.o. female with medical history significant of hyperlipidemia, hypothyroidism, GERD, lymphocytic colitis, anxiety, and depression presented with complaints of shortness of breath and cough over the last 2 - 3 days. Noted associated symptoms of fever, loss of taste/smell, poor appetite, nausea, generalized weakness, and diarrhea. COVID-19 screening was positive. Pt admitted with COVID 19 PNE resp failure and on high flow oxygen.  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   OT comments  Pt progressing gradually towards OT goals. Pt pleasant and motivated to work with therapies with continued limitations in cardiopulmonary tolerance, strength, coordination and balance. Pt received on 4 L O2 with increase to 6 L O2 needed to maintain mid 80s during activity. Pt overall Min A for grooming tasks sitting EOB, Total A for peri care in standing with RW after incontinence. Pt Mod A x 2 for pivot to recliner chair and frequent cues needed to slow breathing with final SpO2 readings at rest >/ 90% with return back to 4 L O2. Will continue to follow and update recommendations as needed.   Follow Up Recommendations  CIR;Supervision/Assistance - 24 hour    Equipment Recommendations  3 in 1 bedside commode;Wheelchair (measurements OT);Wheelchair cushion (measurements OT)    Recommendations for Other Services      Precautions / Restrictions Precautions Precautions: Fall;Other (comment) Precaution Comments: watch vitals Restrictions Weight Bearing Restrictions: No       Mobility Bed Mobility Overal bed mobility: Needs Assistance Bed Mobility:  Supine to Sit     Supine to sit: Max assist;+2 for physical assistance     General bed mobility comments: maxAx2 to get to EOB mostly due to strong posterior lean and being more fatigued today  Transfers Overall transfer level: Needs assistance Equipment used: Rolling walker (2 wheeled) Transfers: Stand Pivot Transfers;Sit to/from Stand Sit to Stand: Mod assist;+2 physical assistance Stand pivot transfers: +2 physical assistance;Mod assist       General transfer comment: cues for hand placement and sequencing, multi episodes of posterior lean especially towards end of transfer and needed maxA to lower down into chair    Balance Overall balance assessment: Needs assistance Sitting-balance support: Bilateral upper extremity supported;Feet unsupported Sitting balance-Leahy Scale: Poor Sitting balance - Comments: strong posterior lean requiring Mod-maxA for balance Postural control: Posterior lean Standing balance support: Bilateral upper extremity supported;During functional activity Standing balance-Leahy Scale: Poor Standing balance comment: reliant on external support                           ADL either performed or assessed with clinical judgement   ADL Overall ADL's : Needs assistance/impaired     Grooming: Minimal assistance;Oral care;Sitting;Wash/dry face Grooming Details (indicate cue type and reason): Min A overall for thoroughness of washing face (seemed to perseverate holding at mouth). Assistance needed to wipe mouth and remove dead skin from lips, as well as apply chapstick                 Toilet Transfer: Moderate assistance;+2 for physical assistance;+2 for safety/equipment;RW Toilet Transfer Details (indicate cue type and reason): simulated to recliner, Mod A x 2 with assistance  needed to advance walker, cue for moving feet and maintain balance with stepping backwards to chair Toileting- Clothing Manipulation and Hygiene: Total assistance;Sit  to/from stand Toileting - Clothing Manipulation Details (indicate cue type and reason): Total A for peri care in standing after bowel incontinence       General ADL Comments: Pt pleasant and agreeable to attempt all activities. Continues with weakness, decreased balance, decreased cardiopulmonary tolerance with need to increase supplemental O2 for activity to combat desats     Vision   Vision Assessment?: No apparent visual deficits   Perception     Praxis      Cognition Arousal/Alertness: Awake/alert Behavior During Therapy: Flat affect Overall Cognitive Status: Impaired/Different from baseline Area of Impairment: Orientation;Attention;Memory;Safety/judgement;Problem solving;Following commands;Awareness                 Orientation Level: Disoriented to;Time;Situation Current Attention Level: Sustained Memory: Decreased short-term memory Following Commands: Follows one step commands consistently;Follows one step commands with increased time Safety/Judgement: Decreased awareness of deficits Awareness: Intellectual Problem Solving: Slow processing;Decreased initiation;Requires verbal cues;Requires tactile cues;Difficulty sequencing General Comments: increased processing time, often avoids detailed answers and give short simple statements such as "ok" or "I guess so"        Exercises     Shoulder Instructions       General Comments Pt received on 4 L o2, WFL at rest. However, SpO2 ranging from 78-91% throughout session even with increase to 6 L O2 for activity. Increased O2 assisted in maintaining mid 80s, 3/4 DOE, fatigued    Pertinent Vitals/ Pain       Pain Assessment: Faces Faces Pain Scale: Hurts a little bit Pain Location: lips and generalized Pain Descriptors / Indicators: Discomfort Pain Intervention(s): Limited activity within patient's tolerance;Monitored during session;Repositioned  Home Living                                           Prior Functioning/Environment              Frequency  Min 3X/week        Progress Toward Goals  OT Goals(current goals can now be found in the care plan section)  Progress towards OT goals: Progressing toward goals  Acute Rehab OT Goals Patient Stated Goal: feel better OT Goal Formulation: With patient Time For Goal Achievement: 10/14/20 Potential to Achieve Goals: Good ADL Goals Pt Will Perform Grooming: with modified independence;standing;sitting Pt Will Perform Lower Body Dressing: with modified independence;sitting/lateral leans;sit to/from stand Pt Will Transfer to Toilet: with modified independence;bedside commode;ambulating Pt Will Perform Toileting - Clothing Manipulation and hygiene: with modified independence;sitting/lateral leans;sit to/from stand Additional ADL Goal #1: Pt will independently monitor SpO2 and use purse lip breathing for ADLs Additional ADL Goal #2: Pt will independently verbalize three energy conservation techniques for ADLs and IADLs  Plan Discharge plan remains appropriate    Co-evaluation    PT/OT/SLP Co-Evaluation/Treatment: Yes Reason for Co-Treatment: Complexity of the patient's impairments (multi-system involvement);For patient/therapist safety;To address functional/ADL transfers   OT goals addressed during session: ADL's and self-care      AM-PAC OT "6 Clicks" Daily Activity     Outcome Measure   Help from another person eating meals?: A Little Help from another person taking care of personal grooming?: A Little Help from another person toileting, which includes using toliet, bedpan, or urinal?: Total Help from another person bathing (including  washing, rinsing, drying)?: A Lot Help from another person to put on and taking off regular upper body clothing?: A Little Help from another person to put on and taking off regular lower body clothing?: Total 6 Click Score: 13    End of Session Equipment Utilized During Treatment: Gait  belt;Rolling walker;Oxygen  OT Visit Diagnosis: Unsteadiness on feet (R26.81);Other abnormalities of gait and mobility (R26.89);Muscle weakness (generalized) (M62.81);Other symptoms and signs involving cognitive function   Activity Tolerance Patient limited by fatigue   Patient Left in chair;with call bell/phone within reach;with chair alarm set   Nurse Communication Mobility status        Time: 2876-8115 OT Time Calculation (min): 34 min  Charges: OT General Charges $OT Visit: 1 Visit OT Treatments $Self Care/Home Management : 8-22 mins  .Layla Maw, OTR/L  Layla Maw 09/30/2020, 1:26 PM

## 2020-09-30 NOTE — Progress Notes (Signed)
Physical Therapy Treatment Patient Details Name: April Brewer MRN: [id number] DOB: Jun 12, 1946 Today's Date: 09/30/2020    History of Present Illness Pt is 74 y.o. female with medical history significant of hyperlipidemia, hypothyroidism, GERD, lymphocytic colitis, anxiety, and depression presented with complaints of shortness of breath and cough over the last 2 - 3 days. Noted associated symptoms of fever, loss of taste/smell, poor appetite, nausea, generalized weakness, and diarrhea. COVID-19 screening was positive. Pt admitted with COVID 19 PNE resp failure and on high flow oxygen.  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    PT Comments    Patient received in bed, very pleasant and cooperative with therapy today. Continues to need increased processing time and very easily fatigued during today's session. Able to mobilize with Mod-MaxAx2 today, had much stronger posterior lean and impaired balance, also needed repeated cues for breathing technique and to slow RR. SPO2 initially on 4LPM and ranged from 84-91%, however dropped to 78-80% with ongoing activity and we needed to increase O2 to 6LPM with modest increase in sats to 86-90%. Left positioned to comfort in recliner with all needs met, chair alarm active. Will continue to follow acutely.    Follow Up Recommendations  CIR     Equipment Recommendations  None recommended by PT    Recommendations for Other Services       Precautions / Restrictions Precautions Precautions: Fall;Other (comment) Precaution Comments: watch vitals Restrictions Weight Bearing Restrictions: No    Mobility  Bed Mobility Overal bed mobility: Needs Assistance Bed Mobility: Supine to Sit     Supine to sit: Max assist;+2 for physical assistance      General bed mobility comments: maxAx2 to get to EOB mostly due to strong posterior lean and being more fatigued today  Transfers Overall transfer level: Needs assistance Equipment used: Rolling walker (2 wheeled) Transfers: Stand Pivot Transfers;Sit to/from Stand Sit to Stand: Mod assist;+2 physical assistance Stand pivot transfers: +2 physical assistance;Mod assist       General transfer comment: cues for hand placement and sequencing, multi episodes of posterior lean especially towards end of transfer and needed maxA to lower down into chair  Ambulation/Gait         Gait velocity: decreased   General Gait Details: pivotal steps to recliner   Stairs             Wheelchair Mobility    Modified Rankin (Stroke Patients Only)       Balance Overall balance assessment: Needs assistance Sitting-balance support: Bilateral upper extremity supported;Feet unsupported Sitting balance-Leahy Scale: Poor Sitting balance - Comments: strong posterior lean requiring Mod-maxA for balance Postural control: Posterior lean Standing balance support: Bilateral upper extremity supported;During functional activity Standing balance-Leahy Scale: Poor Standing balance comment: reliant on external support                            Cognition Arousal/Alertness: Awake/alert Behavior During Therapy: Flat affect Overall Cognitive Status: Impaired/Different from baseline Area of Impairment: Orientation;Attention;Memory;Safety/judgement;Problem solving;Following commands;Awareness                   Current Attention Level: Sustained Memory: Decreased short-term memory Following Commands: Follows one step commands consistently;Follows one step commands with increased time Safety/Judgement: Decreased awareness of deficits Awareness:  Intellectual Problem Solving: Slow processing;Decreased initiation;Requires verbal cues;Requires tactile cues;Difficulty sequencing General  Comments: increased processing time, often avoids detailed answers and give short simple statements such as "ok" or "I guess so"      Exercises      General Comments General comments (skin integrity, edema, etc.): SPO2 randing from 78-91% during session; initially on 4LPM but had to nicrease to 6LPM with mild improvement in sats to upper 80s. Increased recovery time. very fatigued.      Pertinent Vitals/Pain Pain Assessment: Faces Faces Pain Scale: Hurts a little bit Pain Location: lips and generalized Pain Descriptors / Indicators: Discomfort Pain Intervention(s): Limited activity within patient's tolerance;Monitored during session;Repositioned    Home Living                      Prior Function            PT Goals (current goals can now be found in the care plan section) Acute Rehab PT Goals Patient Stated Goal: not stated PT Goal Formulation: With patient Time For Goal Achievement: 10/08/20 Potential to Achieve Goals: Good Additional Goals Additional Goal #1: Patient will demonstrate correct use of flutter valve and incentive spirometer. Will state correct frequency for using each. Progress towards PT goals: Progressing toward goals (slowly)    Frequency    Min 4X/week      PT Plan Current plan remains appropriate    Co-evaluation              AM-PAC PT "6 Clicks" Mobility   Outcome Measure  Help needed turning from your back to your side while in a flat bed without using bedrails?: A Lot Help needed moving from lying on your back to sitting on the side of a flat bed without using bedrails?: A Lot Help needed moving to and from a bed to a chair (including a wheelchair)?: A Lot Help needed standing up from a chair using your arms (e.g., wheelchair or bedside chair)?: A Lot Help needed to walk in hospital room?: A Lot Help needed climbing 3-5 steps with a railing? : Total 6 Click Score: 11    End of Session Equipment Utilized During Treatment:  Oxygen;Gait belt Activity Tolerance: Patient tolerated treatment well;Patient limited by fatigue Patient left: in chair;with call bell/phone within reach;with chair alarm set Nurse Communication: Mobility status PT Visit Diagnosis: Muscle weakness (generalized) (M62.81);Difficulty in walking, not elsewhere classified (R26.2);Ataxic gait (R26.0);Other symptoms and signs involving the nervous system (R29.898)     Time: 2836-6294 PT Time Calculation (min) (ACUTE ONLY): 34 min  Charges:  $Therapeutic Activity: 8-22 mins (co-tx with OT)                     Windell Norfolk, DPT, PN1   Supplemental Physical Therapist [place]    Pager [phone] Acute Rehab Office [phone]

## 2020-09-30 NOTE — Progress Notes (Signed)
  Speech Language Pathology Treatment: Dysphagia  Patient Details Name: April Brewer MRN: [id number] DOB: 1946-04-13 Today's Date: 09/30/2020 Time: 4628-6381 SLP Time Calculation (min) (ACUTE ONLY): 20 min  Assessment / Plan / Recommendation Clinical Impression  Pt was seen for dysphagia treatment in order to determine readiness for PO diet. Pt was very xerostomic and oral care was completed by SLP in order to clear secretions. Pt was alert and fed herself with ocassional hand over hand assistance. After initial PO presentation of ice chip, pt demonstrated an immediate cough. She also demonstrated oral holding and suspected delayed swallow initiation across POs. Recommend pt remain NPO at this time given severity of findings on MBS. SLP will f/u to determine readiness for PO diet.   HPI HPI: Patient is a 74 y.o. female with PMHx of HLD, hypothyroidism, GERD, lymphocytic colitis, anxiety/depression, chronic back pain on narcotics-presented with several days history of shortness of breath-found to have acute hypoxic respiratory failure secondary to COVID-19 pneumonia.  Patient started to develop some lethargy 9/24, progressive, with no improvement despite holding Xanax and oxycodone, MRI brain significant for acute CVA 9/26 .desat to 83 on oxygen. 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  Swallow eval ordered.      SLP Plan  Continue with current plan of care       Recommendations                   Oral Care Recommendations: Oral care QID Follow up Recommendations: Skilled Nursing facility SLP Visit Diagnosis: Dysphagia, oropharyngeal phase (R13.12) Plan: Continue with current plan of care       GO                Greggory Keen 09/30/2020, 2:02 PM

## 2020-09-30 NOTE — Progress Notes (Signed)
   09/30/20 1950  Assess: MEWS Score  Temp 97.8 F (36.6 C)  BP 108/75  Pulse Rate 93  ECG Heart Rate 93  Resp (!) 26  Level of Consciousness Alert  SpO2 94 %  O2 Device Nasal Cannula  Patient Activity (if Appropriate) In bed  O2 Flow Rate (L/min) 3 L/min  Assess: MEWS Score  MEWS Temp 0  MEWS Systolic 0  MEWS Pulse 0  MEWS RR 2  MEWS LOC 0  MEWS Score 2  MEWS Score Color Yellow  Assess: if the MEWS score is Yellow or Red  Were vital signs taken at a resting state? Yes  Focused Assessment No change from prior assessment  Early Detection of Sepsis Score *See Row Information* Low  MEWS guidelines implemented *See Row Information* Yes  Treat  MEWS Interventions Escalated (See documentation below)  Pain Scale 0-10  Pain Score 0  Take Vital Signs  Increase Vital Sign Frequency  Yellow: Q 2hr X 2 then Q 4hr X 2, if remains yellow, continue Q 4hrs  Escalate  MEWS: Escalate Yellow: discuss with charge nurse/RN and consider discussing with provider and RRT  Notify: Charge Nurse/RN  Name of Charge Nurse/RN Notified Blanch Media, RN  Date Charge Nurse/RN Notified 09/30/20  Time Charge Nurse/RN Notified 2002  Document  Progress note created (see row info) Yes

## 2020-10-01 DIAGNOSIS — J9601 Acute respiratory failure with hypoxia: Secondary | ICD-10-CM | POA: Diagnosis not present

## 2020-10-01 DIAGNOSIS — U071 COVID-19: Secondary | ICD-10-CM | POA: Diagnosis not present

## 2020-10-01 DIAGNOSIS — J1282 Pneumonia due to Coronavirus disease 2019: Secondary | ICD-10-CM | POA: Diagnosis not present

## 2020-10-01 LAB — BASIC METABOLIC PANEL
Anion gap: 10 (ref 5–15)
BUN: 15 mg/dL (ref 8–23)
CO2: 23 mmol/L (ref 22–32)
Calcium: 8.5 mg/dL — ABNORMAL LOW (ref 8.9–10.3)
Chloride: 99 mmol/L (ref 98–111)
Creatinine, Ser: 0.67 mg/dL (ref 0.44–1.00)
GFR calc Af Amer: >60 mL/min (ref >60)
GFR calc non Af Amer: >60 mL/min (ref >60)
Glucose, Bld: 111 mg/dL — ABNORMAL HIGH (ref 70–99)
Potassium: 4.5 mmol/L (ref 3.5–5.1)
Sodium: 132 mmol/L — ABNORMAL LOW (ref 135–145)

## 2020-10-01 LAB — GLUCOSE, CAPILLARY
Glucose-Capillary: 113 mg/dL — ABNORMAL HIGH (ref 70–99)
Glucose-Capillary: 128 mg/dL — ABNORMAL HIGH (ref 70–99)
Glucose-Capillary: 132 mg/dL — ABNORMAL HIGH (ref 70–99)
Glucose-Capillary: 137 mg/dL — ABNORMAL HIGH (ref 70–99)
Glucose-Capillary: 149 mg/dL — ABNORMAL HIGH (ref 70–99)
Glucose-Capillary: 151 mg/dL — ABNORMAL HIGH (ref 70–99)
Glucose-Capillary: 90 mg/dL (ref 70–99)
Glucose-Capillary: 98 mg/dL (ref 70–99)

## 2020-10-01 LAB — MAGNESIUM: Magnesium: 2 mg/dL (ref 1.7–2.4)

## 2020-10-01 NOTE — Progress Notes (Signed)
Nutrition Follow-up  DOCUMENTATION CODES:   Not applicable  INTERVENTION:   -Continue Osmolite 1.5 @ 55 ml/hr via cortrak tube   45 ml Prosource TF BID.    100 ml free water flush every 6 hours per MD  Tube feeding regimen provides 2060 kcal (100% of needs), 105 grams of protein, and 1006 ml of H2O. Total free water: 1406 ml/ day  NUTRITION DIAGNOSIS:   Increased nutrient needs related to acute illness (COVID-19) as evidenced by estimated needs.  Ongoing  GOAL:   Patient will meet greater than or equal to 90% of their needs  Progressing   MONITOR:   Diet advancement, Labs, Weight trends, TF tolerance, Skin, I & O's  REASON FOR ASSESSMENT:   Consult Enteral/tube feeding initiation and management  ASSESSMENT:   April Brewer is a 74 y.o. female with medical history significant of hyperlipidemia, hypothyroidism, GERD, lymphocytic colitis, anxiety, and depression presented with complaints of shortness of breath and cough over the last 2 - 3 days.  She reports that her cough has been productive.  Noted associated symptoms of fever, loss of taste/smell, poor appetite, nausea, generalized weakness, and diarrhea.  She reports that she had not received any COVID-19 vaccines due to concerns that it may not be safe.  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 Plan to continue cortrak with plans for MBSS tomorrow (10/02/20).   Pt remains NPO and  receiving TF for nutritional support. Osmolite 1.5 currently infusing via cortrak tube at 55 ml/hr. Pt also receiving 45 ml Prosource TF BID and 100 ml free water flush every 6 hours. Complete regimen provides 2060 kcals, 105 grams protein, and 1406 ml free water daily, which meets 100% of estimated kcal and protein needs.   Labs reviewed: CBGS: 113-137 (inpatient orders for glycemic control are none).   Diet Order:   Diet Order    None      EDUCATION NEEDS:   No education needs have been identified at this time  Skin:  Skin Assessment: Reviewed RN Assessment  Last BM:  10/01/20  Height:   Ht Readings from Last 1 Encounters:  09/24/2020 5\' 4"  (1.626 m)    Weight:   Wt Readings from Last 1 Encounters:  10/01/20 64.7 kg    Ideal Body Weight:  54.5 kg  BMI:  Body mass index is 24.48 kg/m.  Estimated Nutritional Needs:   Kcal:  2000-2200  Protein:  105-120 grams  Fluid:  > 2 L    Loistine Chance, RD, LDN, [place] Registered Dietitian II Certified Diabetes Care and Education Specialist Please refer to [facility] for RD and/or RD on-call/weekend/after hours pager

## 2020-10-01 NOTE — Progress Notes (Signed)
  Speech Language Pathology Treatment: Dysphagia  Patient Details Name: April Brewer MRN: [id number] DOB: 1946/05/31 Today's Date: 10/01/2020 Time: 4431-5400 SLP Time Calculation (min) (ACUTE ONLY): 23 min  Assessment / Plan / Recommendation Clinical Impression  Dysphagia intervention with April Brewer who is alert, pleasant with improved cognitive ability since last interaction with this therapist 9/30. There is continued concern for dysfunctional swallow specifically with coordination/timing and strength across thin, thick and puree textures. Her respiratory rate increases, swallow is clunky/audible and recent stroke, current Covid and cervical degeneration can impact ability. She is supported with Cortrak. Recommend repeat MBS, allow ice chips after oral care. MBS will need to be scheduled for tomorrow afternoon.    HPI HPI: Patient is a 74 y.o. female with PMHx of HLD, hypothyroidism, GERD, lymphocytic colitis, anxiety/depression, chronic back pain on narcotics-presented with several days history of shortness of breath-found to have acute hypoxic respiratory failure secondary to COVID-19 pneumonia.  Patient started to develop some lethargy 9/24, progressive, with no improvement despite holding Xanax and oxycodone, MRI brain significant for acute CVA 9/26 .desat to 83 on oxygen. 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        SLP Plan  Continue with current plan of care       Recommendations  Diet recommendations: Other(comment) (NPO except ice chips after oral care) Medication Administration: Via alternative means                Oral Care Recommendations: Oral care QID Follow up Recommendations: Inpatient Rehab SLP Visit Diagnosis: Dysphagia, oropharyngeal phase (R13.12) Plan: Continue with current plan of care       GO                April Brewer 10/01/2020, 9:14 AM [email or web address].Ed Risk analyst [phone] Office [phone]

## 2020-10-01 NOTE — Progress Notes (Signed)
PROGRESS NOTE                                                                                                                                                                                                             Patient Demographics:    April Brewer, is a 74 y.o. female, DOB - 03-11-1946, BBC:[id number]  Outpatient Primary MD for the patient is McGowen, Adrian Blackwater, MD   Admit date - 09/26/2020   LOS - 31  Chief Complaint  Patient presents with  . Shortness of Breath       Brief Narrative: Patient is a 74 y.o. female with PMHx of HLD, hypothyroidism, GERD, lymphocytic colitis, anxiety/depression, chronic back pain on narcotics-presented with several days history of shortness of breath-found to have acute hypoxic respiratory failure secondary to COVID-19 pneumonia.  Patient was admitted to the hospitalist service-started on COVID-19 treatment-unfortunately further hospital course complicated by acute CVA.  See below for further details.    COVID-19 vaccinated status: Unvaccinated  Significant Events: 9/18>> Admit to [facility] for severe hypoxia due to COVID-19 pneumonia 9/26>> worsening lethargy-MRI brain +ve for acute CVA  Significant studies: 9/17>>Chest x-ray: Diffuse interstitial and groundglass opacities 9/26>> MRI brain: Multifocal acute infarcts in the posterior circulation. 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  Denies any complaints.  No overnight issues noted.  Denies any chest pain or shortness of breath.   Assessment  & Plan :   Acute Hypoxic Resp Failure due to Covid 19 Viral pneumonia Had severe hypoxemia on initial presentation requiring 15 L of HFNC. Patient has completed course of Remdesivir. Remains on steroids which is being tapered down. Patient was also on baricitinib which had to be held due to dysphagia from acute stroke. Continue incentive spirometry and mobilization. D-dimer improved to 0.81. CRP has been improving as well. Patient's respiratory  status is improving.  She is now down to 3 L of oxygen by nasal cannula saturating in the early 90s.   Acute ischemic CVA Felt to be due to hypercoagulable state in the setting of COVID-19. Telemetry without any arrhythmias. Echo without any obvious embolic source.   Followed by PT OT and SLP. Remains high aspiration risk Cortrak feeding tube was placed and tube feedings were initiated.  She will need to be on aspirin and Plavix for 3 months followed by aspirin alone.    Oropharyngeal dysphagia Did not do well with the modified barium swallow on 9/30.  Cortrak tube was placed. Tube feedings initiated.  Speech therapy continues to follow.  PSVT Noted on tele. Patient asymptomatic. Recent echo reviewed. TSH was normal, 1.86 on 8/30.  Patient was started on metoprolol.  No further episodes noted.  Monitor electrolytes periodically.  Potassium 4.5 today and magnesium is 2.0.    Hypernatremia Likely due to free water deficit from lack of oral intake.  After NG tube was placed she was started on free water with a significant drop in sodium levels.  We had cut back on the dose of the free water and stop to D5 infusion.  Sodium level is stable.   Hypokalemia Improved.  Transaminitis Improved-secondary to COVID-19.  HLD LDL noted to be 72.  Continue statin.  Hypothyroidism Continue  Synthroid.  Anxiety/depression Seems to be stable.   Her medications including Xanax lamotrigine and Zoloft are currently on hold.    Chronic back pain Patient on chronic narcotics at home.  Goals of care:  Done by previous rounding physician. Currently DNR.   GI prophylaxis:PPI  Consults: Neurology  Family Communication  :  Son (Thomas [phone]).  Son being updated daily.    Code Status :   DNR  Diet :  Diet Order    None       Disposition Plan  : CIR is recommended by physical therapy. She will need to be at least 21 days out from her infection for her to be able to go to CIR. Positive test was on 9/17.  Will complete 3 weeks on October 7.  Should be able to discontinue airborne and contact isolation effective October 8.  Status is: Inpatient  Remains inpatient appropriate because:Inpatient level of care appropriate due to severity of illness   Dispo: The patient is from: Home              Anticipated d/c is to: SNF versus CIR.              Anticipated d/c date is: > 3 days              Patient currently is not medically stable to d/c.    Antimicorbials  :    Anti-infectives (From admission, onward)   Start     Dose/Rate Route Frequency Ordered Stop   09/14/20 2200  cefTRIAXone (ROCEPHIN) 2 g in sodium chloride 0.9 % 100 mL IVPB  Status:  Discontinued        2 g 200 mL/hr over 30 Minutes Intravenous Every 24 hours 09/14/20 2043 09/15/20 1102   09/14/20 2200  azithromycin (ZITHROMAX) 500 mg in sodium chloride 0.9 % 250 mL IVPB  Status:  Discontinued        500 mg 250 mL/hr over 60 Minutes Intravenous Every 24 hours 09/14/20 2043 09/15/20 1102   09/14/20 1000  remdesivir 100 mg in sodium chloride 0.9 % 100 mL IVPB       "  Followed by" Linked Group Details   100 mg 200 mL/hr over 30 Minutes Intravenous Daily 09/10/2020 2306 09/17/20 0907   09/22/2020 2330  remdesivir 100 mg in sodium chloride 0.9 % 100 mL IVPB       "Followed by" Linked Group Details   100  mg 200 mL/hr over 30 Minutes Intravenous Every 30 min 09/06/2020 2306 09/14/20 0033      Inpatient Medications  Scheduled Meds: . acidophilus  1 capsule Oral Daily  . albuterol  2 puff Inhalation Q6H  . aspirin  325 mg Per Tube Daily  . atorvastatin  10 mg Per Tube Daily  . clopidogrel  75 mg Per Tube Daily  . enoxaparin (LOVENOX) injection  40 mg Subcutaneous Q24H  . famotidine  20 mg Per Tube Daily  . feeding supplement (ENSURE ENLIVE)  237 mL Oral BID BM  . feeding supplement (PROSource TF)  45 mL Per Tube BID  . free water  100 mL Per Tube Q6H  . influenza vaccine adjuvanted  0.5 mL Intramuscular Tomorrow-1000  . lamoTRIgine  100 mg Per Tube Daily  . levothyroxine  100 mcg Per Tube Q0600  . mouth rinse  15 mL Mouth Rinse BID  . methylPREDNISolone (SOLU-MEDROL) injection  20 mg Intravenous Daily  . metoprolol tartrate  25 mg Per Tube BID  . sertraline  100 mg Oral BID  . sodium chloride flush  10-40 mL Intracatheter Q12H  . sodium chloride flush  3 mL Intravenous Q12H   Continuous Infusions: . sodium chloride Stopped (09/15/20 0004)  . sodium chloride Stopped (09/14/20 2233)  . feeding supplement (OSMOLITE 1.5 CAL) 1,000 mL (09/30/20 1533)   PRN Meds:.sodium chloride, sodium chloride, acetaminophen, chlorpheniramine-HYDROcodone, guaiFENesin-dextromethorphan, LORazepam, morphine injection, ondansetron **OR** ondansetron (ZOFRAN) IV, phenol, sodium chloride flush    Bonnielee Haff M.D on 10/01/2020 at 10:04 AM  To page go to [email or web address] - use universal password  [facility] -  Office  [phone]    Objective:   Vitals:   10/01/20 0000 10/01/20 0422 10/01/20 0436 10/01/20 0758  BP: 127/84 138/76  133/72  Pulse: 81 85  79  Resp: (!) 25 (!) 22  17  Temp: 98.5 F (36.9 C) 98.5 F (36.9 C)  98.4 F (36.9 C)  TempSrc: Oral Oral  Oral  SpO2: 92% 93%  98%  Weight:   64.7 kg   Height:        Wt Readings from Last 3 Encounters:  10/01/20 64.7 kg  09/22/20  66.2 kg  09/04/20 69.2 kg     Intake/Output Summary (Last 24 hours) at 10/01/2020 1004 Last data filed at 10/01/2020 0600 Gross per 24 hour  Intake 1871.11 ml  Output 500 ml  Net 1371.11 ml     Physical Exam  General appearance: Awake alert.  In no distress Resp: Clear to auscultation bilaterally.  Normal effort Cardio: S1-S2 is normal regular.  No S3-S4.  No rubs murmurs or bruit GI: Abdomen is soft.  Nontender nondistended.  Bowel sounds are present normal.  No masses organomegaly Extremities: No edema.  Moving all her extremities.      Data Review:    CBC Recent Labs  Lab 09/26/20 1021 09/27/20 0604  WBC 13.5* 14.3*  HGB 12.1 12.3  HCT 37.4 39.0  PLT 404* 380  MCV 95.2 94.9  MCH 30.8 29.9  MCHC 32.4 31.5  RDW 15.7* 15.6*    Chemistries  Recent Labs  Lab 09/26/20 1021 09/26/20 1021 09/27/20 0604 09/28/20 0500 09/29/20 0500  09/30/20 0500 10/01/20 0448  NA 152*   < > 149* 140 134* 131* 132*  K 3.4*   < > 3.8 3.6 3.8 4.1 4.5  CL 117*   < > 111 108 101 98 99  CO2 22   < > 25 23 24 30 23   GLUCOSE 115*   < > 128* 153* 152* 136* 111*  BUN 22   < > 18 17 14 15 15   CREATININE 0.83   < > 0.85 0.80 0.73 0.70 0.67  CALCIUM 8.9   < > 9.2 8.5* 8.4* 8.1* 8.5*  MG  --   --   --  2.3  --  1.7 2.0  AST 30  --  33  --   --   --   --   ALT 28  --  27  --   --   --   --   ALKPHOS 72  --  77  --   --   --   --   BILITOT 1.1  --  1.2  --   --   --   --    < > = values in this interval not displayed.    Micro Results No results found for this or any previous visit (from the past 240 hour(s)).  Radiology Reports CT ANGIO HEAD W OR WO CONTRAST  Result Date: 09/24/2020 CLINICAL DATA:  Posterior circulation stroke.  COVID positive. EXAM: CT ANGIOGRAPHY HEAD AND NECK TECHNIQUE: Multidetector CT imaging of the head and neck was performed using the standard protocol during bolus administration of intravenous contrast. Multiplanar CT image reconstructions and MIPs were  obtained to evaluate the vascular anatomy. Carotid stenosis measurements (when applicable) are obtained utilizing NASCET criteria, using the distal internal carotid diameter as the denominator. CONTRAST:  42mL OMNIPAQUE IOHEXOL 350 MG/ML SOLN COMPARISON:  MRI 09/22/2020 FINDINGS: CTA NECK FINDINGS Aortic arch: Visualized portions demonstrate atherosclerosis without significant stenosis or aneurysm. Right carotid system: No evidence of dissection, stenosis (50% or greater) or occlusion. Left carotid system: No evidence of dissection, stenosis (50% or greater) or occlusion. Vertebral arteries: Right dominant. There is abrupt occlusion of the left vertebral artery at the level of C2. Reconstitution immediately before the vertebral artery courses intradural E, which may be from retrograde flow and/or collaterals. Skeleton: Multilevel severe degenerative disc disease in the cervical spine. Other neck: No mass lesion or adenopathy. Upper chest: Peripheral predominant ground-glass opacities in the imaged lung apices, compatible with COVID pneumonia. Review of the MIP images confirms the above findings CTA HEAD FINDINGS Anterior circulation: No significant stenosis, proximal occlusion, aneurysm, or vascular malformation. Posterior circulation: Abrupt inclusion of the distal right P2 PCA (series 5, image 448). a Venous sinuses: As permitted by contrast timing, patent. Additional comments: Partially imaged edema associated with multifocal posterior circulation infarcts that were better characterized on recent MRI. Review of the MIP images confirms the above findings IMPRESSION: 1. 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 2. Partially imaged edema associated with multifocal posterior circulation infarcts that were better characterized on recent MRI. Non-contrast head CT could further evaluate the degree of edema/mass effect if clinically indicated. 3. Peripheral  predominant ground-glass opacities in the imaged lung apices, compatible with COVID pneumonia given the clinical history. These results will be called to the ordering clinician or representative by the Radiologist Assistant, and communication documented in the PACS or [employer]. Electronically Signed   By:  Margaretha Sheffield MD   On: 09/24/2020 09:45   CT ANGIO NECK W OR WO CONTRAST  Result Date: 09/24/2020 CLINICAL DATA:  Posterior circulation stroke.  COVID positive. EXAM: CT ANGIOGRAPHY HEAD AND NECK TECHNIQUE: Multidetector CT imaging of the head and neck was performed using the standard protocol during bolus administration of intravenous contrast. Multiplanar CT image reconstructions and MIPs were obtained to evaluate the vascular anatomy. Carotid stenosis measurements (when applicable) are obtained utilizing NASCET criteria, using the distal internal carotid diameter as the denominator. CONTRAST:  77mL OMNIPAQUE IOHEXOL 350 MG/ML SOLN COMPARISON:  MRI 09/22/2020 FINDINGS: CTA NECK FINDINGS Aortic arch: Visualized portions demonstrate atherosclerosis without significant stenosis or aneurysm. Right carotid system: No evidence of dissection, stenosis (50% or greater) or occlusion. Left carotid system: No evidence of dissection, stenosis (50% or greater) or occlusion. Vertebral arteries: Right dominant. There is abrupt occlusion of the left vertebral artery at the level of C2. Reconstitution immediately before the vertebral artery courses intradural E, which may be from retrograde flow and/or collaterals. Skeleton: Multilevel severe degenerative disc disease in the cervical spine. Other neck: No mass lesion or adenopathy. Upper chest: Peripheral predominant ground-glass opacities in the imaged lung apices, compatible with COVID pneumonia. Review of the MIP images confirms the above findings CTA HEAD FINDINGS Anterior circulation: No significant stenosis, proximal occlusion, aneurysm, or vascular  malformation. Posterior circulation: Abrupt inclusion of the distal right P2 PCA (series 5, image 448). a Venous sinuses: As permitted by contrast timing, patent. Additional comments: Partially imaged edema associated with multifocal posterior circulation infarcts that were better characterized on recent MRI. Review of the MIP images confirms the above findings IMPRESSION: 1. 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 2. Partially imaged edema associated with multifocal posterior circulation infarcts that were better characterized on recent MRI. Non-contrast head CT could further evaluate the degree of edema/mass effect if clinically indicated. 3. Peripheral predominant ground-glass opacities in the imaged lung apices, compatible with COVID pneumonia given the clinical history. These results will be called to the ordering clinician or representative by the Radiologist Assistant, and communication documented in the PACS or [employer]. Electronically Signed   By: Margaretha Sheffield MD   On: 09/24/2020 09:45   MR BRAIN WO CONTRAST  Result Date: 09/22/2020 CLINICAL DATA:  Delirium.  COVID positive EXAM: MRI HEAD WITHOUT CONTRAST TECHNIQUE: Multiplanar, multiecho pulse sequences of the brain and surrounding structures were obtained without intravenous contrast. COMPARISON:  None. FINDINGS: Brain: Extensive acute infarction in the posterior circulation, patchily present throughout the left more than right cerebellum with right cerebellar involvement mainly at the superior cerebellar distribution and left-sided involvement both at the PICA and superior cerebellar arteries. Right more than left PCA involvement involving the bilateral thalamus and right more than left occipital cortex. Right more than left hippocampal region involvement. No anterior circulation infarct is seen. No hemorrhage, hydrocephalus, or masslike finding. Mild chronic small vessel  disease. Cerebral volume loss. Vascular: A right vertebral and basilar flow void or visible. The left vertebral artery could be hypoplastic or diseased. Skull and upper cervical spine: Normal marrow signal. Cervical spine degeneration with C3-4 and C4-5 anterolisthesis Sinuses/Orbits: Bilateral cataract resection. Nasal septal perforation. Other: Progressively motion degraded study ASAP these results will be called to the ordering clinician or representative by the Radiologist Assistant, and communication documented in the PACS or [employer]. 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  Electronically Signed   By: Monte Fantasia M.D.   On: 09/22/2020 11:45   DG Chest Portable 1 View  Result Date: 09/16/2020 CLINICAL DATA:  Cough, congestion, fever, loss of taste and smell for 1 week EXAM: PORTABLE CHEST 1 VIEW COMPARISON:  01/11/2014 FINDINGS: Single frontal view of the chest demonstrates stable enlarged cardiac silhouette. There is diffuse increased interstitial prominence with bilateral ground-glass consolidation. No effusion or pneumothorax. No acute bony abnormalities. Bilateral shoulder arthroplasties. IMPRESSION: 1. Diffuse interstitial and ground-glass opacities, consistent with bilateral atypical pneumonia given clinical history. Electronically Signed   By: Randa Ngo M.D.   On: 09/10/2020 23:33   DG Swallowing Func-Speech Pathology  Result Date: 09/26/2020 Objective Swallowing Evaluation: Type of Study: MBS-Modified Barium Swallow Study  Patient Details Name: DEVRA STARE MRN: [id number] Date of Birth: December 03, 1946 Today's Date: 09/26/2020 Time: SLP Start Time (ACUTE ONLY): 1412 -SLP Stop Time (ACUTE ONLY): 1429 SLP Time Calculation (min) (ACUTE ONLY): 17 min Past Medical History: Past Medical History: Diagnosis Date . ALLERGIC RHINITIS 06/02/2007 . Allergy  . Arthritis   DDD of cervical, thoracic, and lumbar spine . Blood transfusion without reported  diagnosis  . Cataract   Bilateral removed cateracts . Cervical spondylosis   C5-6 and C6-7.  Pain mgmt as per Dr. Nelva Bush. . Chronic gastritis without bleeding  . Chronic low back pain 02/18/2009  s/p fusion L3-4 through L5-S1.  Pain meds per Dr. Nelva Bush . Chronic renal insufficiency, stage 3 (moderate)   borderline II/III (GFR about 55 ml/min) as of 2021 . DEPRESSION 06/02/2007 . Diverticulosis  . GERD (gastroesophageal reflux disease)  . Heart murmur  . Hiatal hernia  . Hypercholesterolemia  . HYPOTHYROIDISM 06/02/2007  GOITER . IBS (irritable bowel syndrome)  . INSOMNIA 06/02/2007 . Lymphocytic colitis  . Muscle spasms of lower extremity  . Nasal septal perforation   chronic; hx of epistaxis . Normocytic anemia 04/2015  HEME + in ED 08/07/15--endoscopic eval unrevealing.  Iron studies fine-->?Anemia of chronic dz (lymphocytic colitis?).  Hb 02/2020 11.5. . OSTEOPENIA 02/18/2009  Repeat DEXA 07/2014 showed osteoporosis by T score in radius but spine and hip T scores were normal-continue vit D and calcium and repeat DEXA 08/2016 unchanged.  Repeat DEXA 2 yrs. . Peripheral edema   LE's, nonpitting . Renal cyst, acquired, right   1.2 cm, 2021 . Simple hepatic cyst   x 1. 2021 . Trochanteric bursitis of both hips   inj 08/11/20 Past Surgical History: Past Surgical History: Procedure Laterality Date . ABDOMINAL HYSTERECTOMY    for DUB (ovaries are still in) . BACK SURGERY    X 5: fusion of L3-L4 through L5-S1 . CATARACT EXTRACTION Bilateral  . CHOLECYSTECTOMY  2012 . COLONOSCOPY  06/2007; 01/2015  2008 Diverticulosis, o/w normal.  2016 showed lymphocytic colitis with mild diverticulosis: pt was started on oral budesonide at that time. . CT SCAN  09/03/2017  ABDOMEN, HEAD . DEXA  08/16/2014; 09/14/16  Hip and spine ok; radius osteoporotic---but meds not indicated.  Repeat 08/2018 . ESOPHAGOGASTRODUODENOSCOPY  10/2007; 11/27/15; 04/26/20  03/2020: chronic gastritis, H pylori neg, celiac neg.  +Barretts.  Hyperplastic gastric polyp. Marland Kitchen HIP  CLOSED REDUCTION Right 09/20/2018  Procedure: CLOSED REDUCTION HIP;  Surgeon: Paralee Cancel, MD;  Location: [facility];  Service: Orthopedics;  Laterality: Right; . REVERSE SHOULDER ARTHROPLASTY Left 06/09/2019  Procedure: REVERSE SHOULDER ARTHROPLASTY;  Surgeon: Netta Cedars, MD;  Location: [facility];  Service: Orthopedics;  Laterality: Left; . SHOULDER SURGERY Right   \ . TOTAL HIP ARTHROPLASTY  05/31/2012  Procedure: TOTAL HIP ARTHROPLASTY ANTERIOR APPROACH;  Surgeon: Mauri Pole, MD;  Location: [facility];  Service: Orthopedics;  Laterality: Left; . TOTAL HIP ARTHROPLASTY Right 08/30/2018  Procedure: RIGHT TOTAL HIP ARTHROPLASTY ANTERIOR APPROACH;  Surgeon: Paralee Cancel, MD;  Location: [facility];  Service: Orthopedics;  Laterality: Right;  70 mins HPI: Patient is a 74 y.o. female with PMHx of HLD, hypothyroidism, GERD, lymphocytic colitis, anxiety/depression, chronic back pain on narcotics-presented with several days history of shortness of breath-found to have acute hypoxic respiratory failure secondary to COVID-19 pneumonia.  Patient started to develop some lethargy 9/24, progressive, with no improvement despite holding Xanax and oxycodone, MRI brain significant for acute CVA 9/26 .desat to 83 on oxygen. 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  Swallow eval ordered.  Subjective: pt awake in bed Assessment / Plan / Recommendation [facility] IP CLINICAL IMPRESSIONS 09/26/2020 Clinical Impression Severe oropharyngeal dysphagia marked by oral holding, lingual residue, inefficient posterior propulsion, late swallow initiation, decreased pharyngeal contraction. Initially able to propel nectar and first trial of puree but subsequent attempts were unsuccessful. Pt held head in an extended postion (diagnosed with cervical spinal degeneration with C3-4 and C4-5 anterolisthesis; dry spoon presentations and verbal cues were not effective. Barium reached her  pyriform sinuses for several minutes and was eventually suctioned. Swallow initiated late therefore nectar thick barium dumped to her small pyriform sinus cavities and immediately fell into trachea with immediate cough. Residue in valleculae was max with puree. Recommend continued NPO and ST in attempts to return to po's. Continue oral care.     SLP Visit Diagnosis Dysphagia, oropharyngeal phase (R13.12) Attention and concentration deficit following -- Frontal lobe and executive function deficit following -- Impact on safety and function Severe aspiration risk   CH[facility] IP TREATMENT RECOMMENDATION 09/26/2020 Treatment Recommendations Therapy as outlined in treatment plan below   Prognosis 09/26/2020 Prognosis for Safe Diet Advancement Fair Barriers to Reach Goals Severity of deficits Barriers/Prognosis Comment -- [facility] IP DIET RECOMMENDATION 09/26/2020 SLP Diet Recommendations NPO Liquid Administration via -- Medication Administration Via alternative means Compensations -- Postural Changes --   CH[facility] IP OTHER RECOMMENDATIONS 09/26/2020 Recommended Consults -- Oral Care Recommendations Oral care QID Other Recommendations --   [facility] IP FOLLOW UP RECOMMENDATIONS 09/26/2020 Follow up Recommendations Skilled Nursing facility   [facility] IP FREQUENCY AND DURATION 09/26/2020 Speech Therapy Frequency (ACUTE ONLY) min 2x/week Treatment Duration 2 weeks      [facility] IP ORAL PHASE 09/26/2020 Oral Phase Impaired Oral - Pudding Teaspoon -- Oral - Pudding Cup -- Oral - Honey Teaspoon -- Oral - Honey Cup -- Oral - Nectar Teaspoon -- Oral - Nectar Cup Delayed oral transit;Decreased bolus cohesion;Holding of bolus;Reduced posterior propulsion;Lingual/palatal residue Oral - Nectar Straw -- Oral - Thin Teaspoon -- Oral - Thin Cup -- Oral - Thin Straw -- Oral - Puree Holding of bolus;Delayed oral transit;Weak lingual manipulation;Reduced posterior propulsion;Lingual/palatal residue Oral - Mech Soft -- Oral - Regular -- Oral - Multi-Consistency -- Oral - Pill --  Oral Phase - Comment --  [facility] IP PHARYNGEAL PHASE 09/26/2020 Pharyngeal Phase Impaired Pharyngeal- Pudding Teaspoon -- Pharyngeal -- Pharyngeal- Pudding Cup -- Pharyngeal -- Pharyngeal- Honey Teaspoon -- Pharyngeal -- Pharyngeal- Honey Cup -- Pharyngeal -- Pharyngeal- Nectar Teaspoon -- Pharyngeal -- Pharyngeal- Nectar Cup Delayed swallow initiation-pyriform sinuses;Pharyngeal residue - valleculae;Pharyngeal residue - pyriform Pharyngeal -- Pharyngeal- Nectar Straw -- Pharyngeal -- Pharyngeal- Thin Teaspoon -- Pharyngeal -- Pharyngeal-  Thin Cup -- Pharyngeal -- Pharyngeal- Thin Straw -- Pharyngeal -- Pharyngeal- Puree Delayed swallow initiation-vallecula;Reduced pharyngeal peristalsis;Pharyngeal residue - pyriform;Reduced tongue base retraction Pharyngeal -- Pharyngeal- Mechanical Soft -- Pharyngeal -- Pharyngeal- Regular -- Pharyngeal -- Pharyngeal- Multi-consistency -- Pharyngeal -- Pharyngeal- Pill -- Pharyngeal -- Pharyngeal Comment --  CH[facility] IP CERVICAL ESOPHAGEAL PHASE 09/26/2020 Cervical Esophageal Phase WFL Pudding Teaspoon -- Pudding Cup -- Honey Teaspoon -- Honey Cup -- Nectar Teaspoon -- Nectar Cup -- Nectar Straw -- Thin Teaspoon -- Thin Cup -- Thin Straw -- Puree -- Mechanical Soft -- Regular -- Multi-consistency -- Pill -- Cervical Esophageal Comment -- [place] Siren 09/26/2020, 4:48 PM Orbie Pyo Colvin Caroli.Ed Actor Pager [phone] Office [phone]              ECHOCARDIOGRAM COMPLETE  Result Date: 09/23/2020    ECHOCARDIOGRAM REPORT   Patient Name:   BLESS LISENBY Date of Exam: 09/23/2020 Medical Rec #:  [id number]       Height:       64.0 in Accession #:    [id number]      Weight:       145.9 lb Date of Birth:  1946-02-04       BSA:          18.711 m Patient Age:    23 years        BP:           139/82 mmHg Patient Gender: F               HR:           84 bpm. Exam Location:  Inpatient Procedure: 2D Echo, Cardiac Doppler and Color Doppler Indications:    Stroke 434.91  / I163.9  History:        Patient has no prior history of Echocardiogram examinations.                 Signs/Symptoms:Murmur; Risk Factors:Dyslipidemia. COVID-19                 Positive. CKD. GERD. Hypothyroidism.  Sonographer:    Jonelle Sidle Dance Referring Phys: [id number]  1. Left ventricular ejection fraction, by estimation, is 65 to 70%. The left ventricle has normal function. The left ventricle has no regional wall motion abnormalities. There is mild concentric left ventricular hypertrophy. Left ventricular diastolic parameters are consistent with Grade I diastolic dysfunction (impaired relaxation).  2. Right ventricular systolic function is normal. The right ventricular size is normal.  3. Left atrial size was mildly dilated.  4. The mitral valve is normal in structure. Mild mitral valve regurgitation. No evidence of mitral stenosis. Moderate mitral annular calcification.  5. The aortic valve is normal in structure. Aortic valve regurgitation is not visualized. No aortic stenosis is present.  6. The inferior vena cava is normal in size with greater than 50% respiratory variability, suggesting right atrial pressure of 3 mmHg. Comparison(s): No prior Echocardiogram. Conclusion(s)/Recommendation(s): No intracardiac source of embolism detected on this transthoracic study. A transesophageal echocardiogram is recommended to exclude cardiac source of embolism if clinically indicated. FINDINGS  Left Ventricle: Left ventricular ejection fraction, by estimation, is 65 to 70%. The left ventricle has normal function. The left ventricle has no regional wall motion abnormalities. The left ventricular internal cavity size was normal in size. There is  mild concentric left ventricular hypertrophy. Left ventricular diastolic parameters are consistent with Grade I diastolic dysfunction (impaired relaxation). Normal left ventricular filling pressure.  Right Ventricle: The right ventricular size is normal. No  increase in right ventricular wall thickness. Right ventricular systolic function is normal. Left Atrium: Left atrial size was mildly dilated. Right Atrium: Right atrial size was normal in size. Pericardium: There is no evidence of pericardial effusion. Mitral Valve: The mitral valve is normal in structure. There is mild thickening of the mitral valve leaflet(s). There is mild calcification of the mitral valve leaflet(s). Moderate mitral annular calcification. Mild mitral valve regurgitation. No evidence of mitral valve stenosis. Tricuspid Valve: The tricuspid valve is normal in structure. Tricuspid valve regurgitation is mild . No evidence of tricuspid stenosis. Aortic Valve: The aortic valve is normal in structure. Aortic valve regurgitation is not visualized. No aortic stenosis is present. Pulmonic Valve: The pulmonic valve was normal in structure. Pulmonic valve regurgitation is not visualized. No evidence of pulmonic stenosis. Aorta: The aortic root is normal in size and structure. Venous: The inferior vena cava is normal in size with greater than 50% respiratory variability, suggesting right atrial pressure of 3 mmHg. IAS/Shunts: No atrial level shunt detected by color flow Doppler.  LEFT VENTRICLE PLAX 2D LVIDd:         3.80 cm  Diastology LVIDs:         2.40 cm  LV e' lateral:   9.46 cm/s LV PW:         1.10 cm  LV E/e' lateral: 8.7 LV IVS:        1.10 cm LVOT diam:     2.10 cm LV SV:         105 LV SV Index:   61 LVOT Area:     3.46 cm  RIGHT VENTRICLE             IVC RV Basal diam:  2.00 cm     IVC diam: 1.20 cm RV S prime:     13.90 cm/s TAPSE (M-mode): 1.5 cm LEFT ATRIUM             Index       RIGHT ATRIUM          Index LA diam:        4.00 cm 2.34 cm/m  RA Area:     8.10 cm LA Vol (A2C):   48.1 ml 28.11 ml/m RA Volume:   13.00 ml 7.60 ml/m LA Vol (A4C):   34.5 ml 20.16 ml/m LA Biplane Vol: 42.3 ml 24.72 ml/m  AORTIC VALVE LVOT Vmax:   148.00 cm/s LVOT Vmean:  98.300 cm/s LVOT VTI:    0.302 m   AORTA Ao Root diam: 3.20 cm Ao Asc diam:  3.10 cm MITRAL VALVE MV Area (PHT): 2.53 cm     SHUNTS MV Decel Time: 300 msec     Systemic VTI:  0.30 m MV E velocity: 81.90 cm/s   Systemic Diam: 2.10 cm MV A velocity: 133.00 cm/s MV E/A ratio:  0.62 Ena Dawley MD Electronically signed by Ena Dawley MD Signature Date/Time: 09/23/2020/5:19:20 PM    Final

## 2020-10-01 NOTE — Progress Notes (Addendum)
Physical Therapy Treatment Patient Details Name: April Brewer MRN: [id number] DOB: [birth year] Today's Date: 10/01/2020    History of Present Illness Pt is 74 y.o. female with medical history significant of hyperlipidemia, hypothyroidism, GERD, lymphocytic colitis, anxiety, and depression presented with complaints of shortness of breath and cough over the last 2 - 3 days. Noted associated symptoms of fever, loss of taste/smell, poor appetite, nausea, generalized weakness, and diarrhea. COVID-19 screening was positive. Pt admitted with COVID 19 PNE resp failure and on high flow oxygen.  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    PT Comments    Patient received in bed, more fatigued today but willing to attempt therapy. Continues to need heavy physical assistance for bed mobility and multiple sit to stand transfers/stand pivot to chair; started session on 3LPM but needed to increase to as much as 6LPM with activity to maintain sats in mid-upper 80s with activity. Continues to be very weak and easily fatigued but able to maintain midline and demonstrated less posterior lean with upright activity today. Still unable to progress gait due to fatigue with just transfers. Left up in recliner with all needs met, chair alarm active. At this point do not feel that she would be able to tolerate intensity of CIR- updated PT recommendations as appropriate.    Follow Up Recommendations  SNF;Supervision/Assistance - 24 hour     Equipment Recommendations  Rolling walker with 5" wheels;3in1 (PT);Wheelchair cushion (measurements PT);Wheelchair (measurements PT)    Recommendations for Other Services       Precautions / Restrictions Precautions Precautions: Fall;Other (comment) Precaution Comments: watch  vitals Restrictions Weight Bearing Restrictions: No    Mobility  Bed Mobility Overal bed mobility: Needs Assistance Bed Mobility: Supine to Sit     Supine to sit: Min assist;+2 for physical assistance;HOB elevated     General bed mobility comments: MinAx2 to get to EOB today, more mobility and most assist given with sitting trunk up to EOB  Transfers Overall transfer level: Needs assistance Equipment used: Rolling walker (2 wheeled) Transfers: Sit to/from [facility] Sit to Stand: Mod assist;+2 physical assistance Stand pivot transfers: +2 physical assistance;Mod assist       General transfer comment: heavy ModAx2 for multiple stands today, remains easily fatigued and often asks to sit down after standing for only 20-30 seconds; less posterior lean today but too fatigued to do anything but multiple stands and stand-pivot transfer  Ambulation/Gait             General Gait Details: pivotal steps to recliner   Stairs             Wheelchair Mobility    Modified Rankin (Stroke Patients Only)       Balance Overall balance assessment: Needs assistance Sitting-balance support: Bilateral upper extremity supported;Feet unsupported Sitting balance-Leahy Scale: Fair Sitting balance - Comments: close min guard for safety to maintain midline today   Standing balance support: Bilateral upper extremity supported;During functional activity Standing balance-Leahy Scale: Poor Standing balance comment: reliant on external support                            Cognition Arousal/Alertness: Awake/alert Behavior During Therapy: Flat affect Overall Cognitive Status: Impaired/Different from baseline  Current Attention Level: Sustained Memory: Decreased short-term memory Following Commands: Follows one step commands consistently;Follows one step commands with increased time Safety/Judgement: Decreased awareness of  deficits;Decreased awareness of safety Awareness: Intellectual Problem Solving: Slow processing;Decreased initiation;Requires verbal cues;Requires tactile cues;Difficulty sequencing General Comments: still giving only short answers such as "OK" or "let me lay down", still in need of increased processing time and with very poor awareness of safety. No recall of education provided yesterday.      Exercises      General Comments General comments (skin integrity, edema, etc.): on 3LPM O2 and WNL at rest; able to to EOB and perform multiple stands but needed increase to 4LPM with activity but still ranging from 80-89% and very fatigued, increased to 6LPM to maintain sats during furhter stands and for stp transfer      Pertinent Vitals/Pain Pain Assessment: Faces Faces Pain Scale: Hurts a little bit Pain Location: generalized Pain Descriptors / Indicators: Discomfort Pain Intervention(s): Limited activity within patient's tolerance;Monitored during session;Repositioned    Home Living                      Prior Function            PT Goals (current goals can now be found in the care plan section) Acute Rehab PT Goals Patient Stated Goal: feel better PT Goal Formulation: With patient Time For Goal Achievement: 10/08/20 Potential to Achieve Goals: Good Additional Goals Additional Goal #1: Patient will demonstrate correct use of flutter valve and incentive spirometer. Will state correct frequency for using each. Progress towards PT goals: Progressing toward goals (slowly)    Frequency    Min 3X/week      PT Plan Discharge plan needs to be updated;Frequency needs to be updated;Equipment recommendations need to be updated    Co-evaluation              AM-PAC PT "6 Clicks" Mobility   Outcome Measure  Help needed turning from your back to your side while in a flat bed without using bedrails?: A Little Help needed moving from lying on your back to sitting on the  side of a flat bed without using bedrails?: A Little Help needed moving to and from a bed to a chair (including a wheelchair)?: A Lot Help needed standing up from a chair using your arms (e.g., wheelchair or bedside chair)?: A Lot Help needed to walk in hospital room?: A Lot Help needed climbing 3-5 steps with a railing? : Total 6 Click Score: 13    End of Session Equipment Utilized During Treatment: Oxygen Activity Tolerance: Patient tolerated treatment well;Patient limited by fatigue Patient left: in chair;with call bell/phone within reach;with chair alarm set Nurse Communication: Mobility status PT Visit Diagnosis: Muscle weakness (generalized) (M62.81);Difficulty in walking, not elsewhere classified (R26.2);Ataxic gait (R26.0);Other symptoms and signs involving the nervous system (R29.898)     Time: 1020-1100 PT Time Calculation (min) (ACUTE ONLY): 40 min  Charges:  $Therapeutic Activity: 38-52 mins                     Windell Norfolk, DPT, PN1   Supplemental Physical Therapist [place]    Pager [phone] Acute Rehab Office [phone]

## 2020-10-02 ENCOUNTER — Inpatient Hospital Stay (HOSPITAL_COMMUNITY): Payer: Medicare HMO

## 2020-10-02 DIAGNOSIS — U071 COVID-19: Secondary | ICD-10-CM | POA: Diagnosis not present

## 2020-10-02 DIAGNOSIS — J1282 Pneumonia due to Coronavirus disease 2019: Secondary | ICD-10-CM | POA: Diagnosis not present

## 2020-10-02 DIAGNOSIS — R1312 Dysphagia, oropharyngeal phase: Secondary | ICD-10-CM | POA: Diagnosis not present

## 2020-10-02 DIAGNOSIS — J9601 Acute respiratory failure with hypoxia: Secondary | ICD-10-CM | POA: Diagnosis not present

## 2020-10-02 LAB — BASIC METABOLIC PANEL WITH GFR
Anion gap: 10 (ref 5–15)
BUN: 16 mg/dL (ref 8–23)
CO2: 24 mmol/L (ref 22–32)
Calcium: 8.8 mg/dL — ABNORMAL LOW (ref 8.9–10.3)
Chloride: 99 mmol/L (ref 98–111)
Creatinine, Ser: 0.67 mg/dL (ref 0.44–1.00)
GFR calc non Af Amer: >60 mL/min
Glucose, Bld: 98 mg/dL (ref 70–99)
Potassium: 4.6 mmol/L (ref 3.5–5.1)
Sodium: 133 mmol/L — ABNORMAL LOW (ref 135–145)

## 2020-10-02 LAB — GLUCOSE, CAPILLARY
Glucose-Capillary: 125 mg/dL — ABNORMAL HIGH (ref 70–99)
Glucose-Capillary: 137 mg/dL — ABNORMAL HIGH (ref 70–99)
Glucose-Capillary: 144 mg/dL — ABNORMAL HIGH (ref 70–99)
Glucose-Capillary: 157 mg/dL — ABNORMAL HIGH (ref 70–99)
Glucose-Capillary: 159 mg/dL — ABNORMAL HIGH (ref 70–99)
Glucose-Capillary: 177 mg/dL — ABNORMAL HIGH (ref 70–99)

## 2020-10-02 MED ORDER — RESOURCE THICKENUP CLEAR PO POWD
ORAL | Status: DC | PRN
  Filled 2020-10-02 (×2): qty 125

## 2020-10-02 NOTE — Progress Notes (Signed)
Modified Barium Swallow Progress Note  Patient Details  Name: April Brewer MRN: [id number] Date of Birth: July 01, 1946  Today's Date: 10/02/2020  Modified Barium Swallow completed.  Full report located under Chart Review in the Imaging Section.  Brief recommendations include the following:  Clinical Impression  MBS revealed moderate oropharyngeal dysphagia as characterized by delayed oral transit, oral holding, premature spillage, late swallow initiation, and instances of penetration/aspiration. Suspect that pt's decreased respiratory status secondary to Covid-19 is negatively impacting her swallow function. Delayed oral transit and oral holding was observed across all POs. Increased difficulty with oral manipulation was POs was observed with solids. Pt demonstrated premature spillage with a cup sip of thin liquid to the level of the pyriforms, which subsequently entered the airway and an immediate cough was observed. Given a straw sip of thin liquid, premature spillage to the level of the pyriforms occurred and trace flash penetration was observed. Despite oral phase impairments, she demonstrated a functional pharyngeal swallow with nectar-thick liquids and puree. Residue was observed in the valleculae following regular solid and was cleared with liquid wash. When respiratory status improves, pt may be able to tolerate solids. Recommend dys 1 diet and nectar-thick liquids.   Swallow Evaluation Recommendations       SLP Diet Recommendations: Dysphagia 1 (Puree) solids;Nectar thick liquid   Liquid Administration via: Straw   Medication Administration: Crushed with puree   Supervision: Full supervision/cueing for compensatory strategies   Compensations: Slow rate;Small sips/bites   Postural Changes: Seated upright at 90 degrees   Oral Care Recommendations: Oral care QID   Other Recommendations: Have oral suction available    Greggory Keen 10/02/2020,2:49 PM

## 2020-10-02 NOTE — Progress Notes (Addendum)
Inpatient Rehabilitation Admissions Coordinator  Noted Physical Therapy has changed their recommendations to SNF on 10/5. PT felt she would be unable to tolerate the intensity of CIR. Updated acute team and TOC of current therapy recommendation.  Danne Baxter, RN, MSN Rehab Admissions Coordinator [phone] 10/02/2020 8:55 AM

## 2020-10-02 NOTE — Progress Notes (Signed)
PROGRESS NOTE                                                                                                                                                                                                             Patient Demographics:    April Brewer, is a 74 y.o. female, DOB - [birth year], XKP:[id number]  Outpatient Primary MD for the patient is McGowen, Adrian Blackwater, MD   Admit date - 09/12/2020   LOS - 50  Chief Complaint  Patient presents with  . Shortness of Breath       Brief Narrative: Patient is a 74 y.o. female with PMHx of HLD, hypothyroidism, GERD, lymphocytic colitis, anxiety/depression, chronic back pain on narcotics-presented with several days history of shortness of breath-found to have acute hypoxic respiratory failure secondary to COVID-19 pneumonia.  Patient was admitted to the hospitalist service-started on COVID-19 treatment-unfortunately further hospital course complicated by acute CVA.  See below for further details.    COVID-19 vaccinated status: Unvaccinated  Significant Events: 9/18>> Admit to [facility] for severe hypoxia due to COVID-19 pneumonia 9/26>> worsening lethargy-MRI brain +ve for acute CVA  Significant studies: 9/17>>Chest x-ray: Diffuse interstitial and groundglass opacities 9/26>> MRI brain: Multifocal acute infarcts in the posterior circulation. 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  No overnight issues.     Assessment  & Plan :   Acute Hypoxic Resp Failure due to Covid 19 Viral pneumonia Had severe hypoxemia on initial presentation requiring 15 L of HFNC. Patient has completed course of Remdesivir. Remains on steroids which is being tapered down. Patient was also on baricitinib which had to be held due to dysphagia from acute stroke. Continue incentive spirometry and mobilization. D-dimer improved to 0.81. CRP has been improving as well. Patient's respiratory status is stable.  Patient currently noted to be on 3 L of  oxygen by nasal cannula saturating in the early 90s.    Acute ischemic CVA Felt to be due to hypercoagulable state in the setting of COVID-19. Telemetry without any arrhythmias. Echo without any obvious embolic source.   Followed by PT OT and SLP.  Cortrak feeding tube was placed and tube feedings were initiated.  She will need to be on aspirin and Plavix for 3 months followed by aspirin alone.    Oropharyngeal dysphagia Did not do well with the modified barium swallow on 9/30.  Cortrak tube was placed. Tube feedings initiated.  Speech therapy continues to follow.  Plan is to repeat modified barium swallow this afternoon.  PSVT Noted on tele. Patient asymptomatic. Recent echo reviewed. TSH was normal, 1.86 on 8/30.  Patient was started on metoprolol.  No further episodes noted.  Monitor electrolytes periodically.     Hypernatremia Likely due to free water deficit from lack of oral intake.  After NG tube was placed she was started on free water with a significant drop in sodium levels.  We had cut back on the dose of the free water and stop to D5 infusion.  Sodium remains stable.  Hypokalemia Improved.  Transaminitis Improved-secondary to COVID-19.  HLD LDL noted to be 72.  Continue statin.  Hypothyroidism Continue Synthroid.  Anxiety/depression Seems to be stable.   Noted to be on lamotrigine  and Zoloft  Chronic back pain Patient on chronic narcotics at home.  Goals of care:  Done by previous rounding physician. Currently DNR.   GI prophylaxis:PPI  Consults: Neurology  Family Communication  :  Son (Thomas [phone]).  Son being updated daily.    Code Status :   DNR  Diet :  Diet Order    None       Disposition Plan  : Physical therapy has changed the recommendation from [facility] to SNF as they do not think that the patient will be able to tolerate the intensive therapy at [facility].  Social worker is following.  We will need to address the NG tube before she can be discharged.    COVID-19 positive test was on 9/17.  Will complete 3 weeks on October 7.  Should be able to discontinue airborne and contact isolation effective October 8.  Status is: Inpatient  Remains inpatient appropriate because:Inpatient level of care appropriate due to severity of illness   Dispo: The patient is from: Home              Anticipated d/c is to: SNF versus [facility].              Anticipated d/c date is: > 3 days              Patient currently is not medically stable to d/c.    Antimicorbials  :    Anti-infectives (From admission, onward)   Start     Dose/Rate Route Frequency Ordered Stop   09/14/20 2200  cefTRIAXone (ROCEPHIN) 2 g in sodium chloride 0.9 % 100 mL IVPB  Status:  Discontinued        2 g 200 mL/hr over 30 Minutes Intravenous Every 24 hours 09/14/20 2043 09/15/20 1102   09/14/20 2200  azithromycin (ZITHROMAX) 500 mg in sodium chloride 0.9 % 250 mL IVPB  Status:  Discontinued        500 mg 250 mL/hr over 60 Minutes Intravenous Every 24 hours 09/14/20 2043 09/15/20 1102   09/14/20 1000  remdesivir 100 mg in sodium chloride 0.9 %  100 mL IVPB       "Followed by" Linked Group Details   100 mg 200 mL/hr over 30 Minutes Intravenous Daily 09/03/2020 2306 09/17/20 0907   09/24/2020 2330  remdesivir 100 mg in sodium chloride 0.9 % 100 mL IVPB       "Followed by" Linked Group Details    100 mg 200 mL/hr over 30 Minutes Intravenous Every 30 min 09/12/2020 2306 09/14/20 0033      Inpatient Medications  Scheduled Meds: . acidophilus  1 capsule Oral Daily  . albuterol  2 puff Inhalation Q6H  . aspirin  325 mg Per Tube Daily  . atorvastatin  10 mg Per Tube Daily  . clopidogrel  75 mg Per Tube Daily  . enoxaparin (LOVENOX) injection  40 mg Subcutaneous Q24H  . famotidine  20 mg Per Tube Daily  . feeding supplement (PROSource TF)  45 mL Per Tube BID  . free water  100 mL Per Tube Q6H  . influenza vaccine adjuvanted  0.5 mL Intramuscular Tomorrow-1000  . lamoTRIgine  100 mg Per Tube Daily  . levothyroxine  100 mcg Per Tube Q0600  . mouth rinse  15 mL Mouth Rinse BID  . metoprolol tartrate  25 mg Per Tube BID  . sertraline  100 mg Oral BID  . sodium chloride flush  10-40 mL Intracatheter Q12H  . sodium chloride flush  3 mL Intravenous Q12H   Continuous Infusions: . sodium chloride Stopped (09/15/20 0004)  . sodium chloride Stopped (09/14/20 2233)  . feeding supplement (OSMOLITE 1.5 CAL) 474 mL (10/01/20 2227)   PRN Meds:.sodium chloride, sodium chloride, acetaminophen, chlorpheniramine-HYDROcodone, guaiFENesin-dextromethorphan, LORazepam, morphine injection, ondansetron **OR** ondansetron (ZOFRAN) IV, phenol, sodium chloride flush    Bonnielee Haff M.D on 10/02/2020 at 10:51 AM  To page go to [email or web address] - use universal password  [facility] -  Office  [phone]    Objective:   Vitals:   10/01/20 1940 10/02/20 0337 10/02/20 0420 10/02/20 0802  BP: 118/79 123/74  112/62  Pulse: 81 79  87  Resp: 20 20  (!) 22  Temp: 98 F (36.7 C) 98.1 F (36.7 C)  98.9 F (37.2 C)  TempSrc: Oral Oral  Oral  SpO2: 93% 90%    Weight:   64.3 kg   Height:        Wt Readings from Last 3 Encounters:  10/02/20 64.3 kg  09/22/20 66.2 kg  09/04/20 69.2 kg     Intake/Output Summary (Last 24 hours) at 10/02/2020 1051 Last data filed at 10/02/2020 0900 Gross per  24 hour  Intake 1266.25 ml  Output 900 ml  Net 366.25 ml     Physical Exam  General appearance: Awake alert.  In no distress Resp: Normal effort at rest.  Few crackles at the bases.  No wheezing or rhonchi.  Improved aeration. Cardio: S1-S2 is normal regular.  No S3-S4.  No rubs murmurs or bruit GI: Abdomen is soft.  Nontender nondistended.  Bowel sounds are present normal.  No masses organomegaly Moving all her extremities.  No edema noted.      Data Review:    CBC Recent Labs  Lab 09/26/20 1021 09/27/20 0604  WBC 13.5* 14.3*  HGB 12.1 12.3  HCT 37.4 39.0  PLT 404* 380  MCV 95.2 94.9  MCH 30.8 29.9  MCHC 32.4 31.5  RDW 15.7* 15.6*    Chemistries  Recent Labs  Lab 09/26/20 1021 09/26/20 1021 09/27/20 0604 09/27/20 0604 09/28/20 0500 09/29/20 0500 09/30/20  0500 10/01/20 0448 10/02/20 0440  NA 152*   < > 149*   < > 140 134* 131* 132* 133*  K 3.4*   < > 3.8   < > 3.6 3.8 4.1 4.5 4.6  CL 117*   < > 111   < > 108 101 98 99 99  CO2 22   < > 25   < > 23 24 30 23 24   GLUCOSE 115*   < > 128*   < > 153* 152* 136* 111* 98  BUN 22   < > 18   < > 17 14 15 15 16   CREATININE 0.83   < > 0.85   < > 0.80 0.73 0.70 0.67 0.67  CALCIUM 8.9   < > 9.2   < > 8.5* 8.4* 8.1* 8.5* 8.8*  MG  --   --   --   --  2.3  --  1.7 2.0  --   AST 30  --  33  --   --   --   --   --   --   ALT 28  --  27  --   --   --   --   --   --   ALKPHOS 72  --  77  --   --   --   --   --   --   BILITOT 1.1  --  1.2  --   --   --   --   --   --    < > = values in this interval not displayed.    Micro Results No results found for this or any previous visit (from the past 240 hour(s)).  Radiology Reports CT ANGIO HEAD W OR WO CONTRAST  Result Date: 09/24/2020 CLINICAL DATA:  Posterior circulation stroke.  COVID positive. EXAM: CT ANGIOGRAPHY HEAD AND NECK TECHNIQUE: Multidetector CT imaging of the head and neck was performed using the standard protocol during bolus administration of intravenous  contrast. Multiplanar CT image reconstructions and MIPs were obtained to evaluate the vascular anatomy. Carotid stenosis measurements (when applicable) are obtained utilizing NASCET criteria, using the distal internal carotid diameter as the denominator. CONTRAST:  30mL OMNIPAQUE IOHEXOL 350 MG/ML SOLN COMPARISON:  MRI 09/22/2020 FINDINGS: CTA NECK FINDINGS Aortic arch: Visualized portions demonstrate atherosclerosis without significant stenosis or aneurysm. Right carotid system: No evidence of dissection, stenosis (50% or greater) or occlusion. Left carotid system: No evidence of dissection, stenosis (50% or greater) or occlusion. Vertebral arteries: Right dominant. There is abrupt occlusion of the left vertebral artery at the level of C2. Reconstitution immediately before the vertebral artery courses intradural E, which may be from retrograde flow and/or collaterals. Skeleton: Multilevel severe degenerative disc disease in the cervical spine. Other neck: No mass lesion or adenopathy. Upper chest: Peripheral predominant ground-glass opacities in the imaged lung apices, compatible with COVID pneumonia. Review of the MIP images confirms the above findings CTA HEAD FINDINGS Anterior circulation: No significant stenosis, proximal occlusion, aneurysm, or vascular malformation. Posterior circulation: Abrupt inclusion of the distal right P2 PCA (series 5, image 448). a Venous sinuses: As permitted by contrast timing, patent. Additional comments: Partially imaged edema associated with multifocal posterior circulation infarcts that were better characterized on recent MRI. Review of the MIP images confirms the above findings IMPRESSION: 1. 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 2. Partially imaged edema associated with multifocal posterior  circulation infarcts that were better characterized on recent MRI. Non-contrast head CT could further evaluate the degree  of edema/mass effect if clinically indicated. 3. Peripheral predominant ground-glass opacities in the imaged lung apices, compatible with COVID pneumonia given the clinical history. These results will be called to the ordering clinician or representative by the Radiologist Assistant, and communication documented in the PACS or [employer]. Electronically Signed   By: Margaretha Sheffield MD   On: 09/24/2020 09:45   CT ANGIO NECK W OR WO CONTRAST  Result Date: 09/24/2020 CLINICAL DATA:  Posterior circulation stroke.  COVID positive. EXAM: CT ANGIOGRAPHY HEAD AND NECK TECHNIQUE: Multidetector CT imaging of the head and neck was performed using the standard protocol during bolus administration of intravenous contrast. Multiplanar CT image reconstructions and MIPs were obtained to evaluate the vascular anatomy. Carotid stenosis measurements (when applicable) are obtained utilizing NASCET criteria, using the distal internal carotid diameter as the denominator. CONTRAST:  61mL OMNIPAQUE IOHEXOL 350 MG/ML SOLN COMPARISON:  MRI 09/22/2020 FINDINGS: CTA NECK FINDINGS Aortic arch: Visualized portions demonstrate atherosclerosis without significant stenosis or aneurysm. Right carotid system: No evidence of dissection, stenosis (50% or greater) or occlusion. Left carotid system: No evidence of dissection, stenosis (50% or greater) or occlusion. Vertebral arteries: Right dominant. There is abrupt occlusion of the left vertebral artery at the level of C2. Reconstitution immediately before the vertebral artery courses intradural E, which may be from retrograde flow and/or collaterals. Skeleton: Multilevel severe degenerative disc disease in the cervical spine. Other neck: No mass lesion or adenopathy. Upper chest: Peripheral predominant ground-glass opacities in the imaged lung apices, compatible with COVID pneumonia. Review of the MIP images confirms the above findings CTA HEAD FINDINGS Anterior circulation: No significant  stenosis, proximal occlusion, aneurysm, or vascular malformation. Posterior circulation: Abrupt inclusion of the distal right P2 PCA (series 5, image 448). a Venous sinuses: As permitted by contrast timing, patent. Additional comments: Partially imaged edema associated with multifocal posterior circulation infarcts that were better characterized on recent MRI. Review of the MIP images confirms the above findings IMPRESSION: 1. 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 2. Partially imaged edema associated with multifocal posterior circulation infarcts that were better characterized on recent MRI. Non-contrast head CT could further evaluate the degree of edema/mass effect if clinically indicated. 3. Peripheral predominant ground-glass opacities in the imaged lung apices, compatible with COVID pneumonia given the clinical history. These results will be called to the ordering clinician or representative by the Radiologist Assistant, and communication documented in the PACS or [employer]. Electronically Signed   By: Margaretha Sheffield MD   On: 09/24/2020 09:45   MR BRAIN WO CONTRAST  Result Date: 09/22/2020 CLINICAL DATA:  Delirium.  COVID positive EXAM: MRI HEAD WITHOUT CONTRAST TECHNIQUE: Multiplanar, multiecho pulse sequences of the brain and surrounding structures were obtained without intravenous contrast. COMPARISON:  None. FINDINGS: Brain: Extensive acute infarction in the posterior circulation, patchily present throughout the left more than right cerebellum with right cerebellar involvement mainly at the superior cerebellar distribution and left-sided involvement both at the PICA and superior cerebellar arteries. Right more than left PCA involvement involving the bilateral thalamus and right more than left occipital cortex. Right more than left hippocampal region involvement. No anterior circulation infarct is seen. No hemorrhage,  hydrocephalus, or masslike finding. Mild chronic small vessel disease. Cerebral volume loss. Vascular: A right vertebral and basilar flow void or visible. The  left vertebral artery could be hypoplastic or diseased. Skull and upper cervical spine: Normal marrow signal. Cervical spine degeneration with C3-4 and C4-5 anterolisthesis Sinuses/Orbits: Bilateral cataract resection. Nasal septal perforation. Other: Progressively motion degraded study ASAP these results will be called to the ordering clinician or representative by the Radiologist Assistant, and communication documented in the PACS or [employer]. 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 Electronically Signed   By: Monte Fantasia M.D.   On: 09/22/2020 11:45   DG Chest Portable 1 View  Result Date: 09/12/2020 CLINICAL DATA:  Cough, congestion, fever, loss of taste and smell for 1 week EXAM: PORTABLE CHEST 1 VIEW COMPARISON:  01/11/2014 FINDINGS: Single frontal view of the chest demonstrates stable enlarged cardiac silhouette. There is diffuse increased interstitial prominence with bilateral ground-glass consolidation. No effusion or pneumothorax. No acute bony abnormalities. Bilateral shoulder arthroplasties. IMPRESSION: 1. Diffuse interstitial and ground-glass opacities, consistent with bilateral atypical pneumonia given clinical history. Electronically Signed   By: Randa Ngo M.D.   On: 09/03/2020 23:33   DG Swallowing Func-Speech Pathology  Result Date: 09/26/2020 Objective Swallowing Evaluation: Type of Study: MBS-Modified Barium Swallow Study  Patient Details Name: SHIZUYE RUPERT MRN: [id number] Date of Birth: 03-02-1946 Today's Date: 09/26/2020 Time: SLP Start Time (ACUTE ONLY): 1412 -SLP Stop Time (ACUTE ONLY): 1429 SLP Time Calculation (min) (ACUTE ONLY): 17 min Past Medical History: Past Medical History: Diagnosis Date . ALLERGIC RHINITIS 06/02/2007 . Allergy  . Arthritis   DDD of cervical,  thoracic, and lumbar spine . Blood transfusion without reported diagnosis  . Cataract   Bilateral removed cateracts . Cervical spondylosis   C5-6 and C6-7.  Pain mgmt as per Dr. Nelva Bush. . Chronic gastritis without bleeding  . Chronic low back pain 02/18/2009  s/p fusion L3-4 through L5-S1.  Pain meds per Dr. Nelva Bush . Chronic renal insufficiency, stage 3 (moderate)   borderline II/III (GFR about 55 ml/min) as of 2021 . DEPRESSION 06/02/2007 . Diverticulosis  . GERD (gastroesophageal reflux disease)  . Heart murmur  . Hiatal hernia  . Hypercholesterolemia  . HYPOTHYROIDISM 06/02/2007  GOITER . IBS (irritable bowel syndrome)  . INSOMNIA 06/02/2007 . Lymphocytic colitis  . Muscle spasms of lower extremity  . Nasal septal perforation   chronic; hx of epistaxis . Normocytic anemia 04/2015  HEME + in ED 08/07/15--endoscopic eval unrevealing.  Iron studies fine-->?Anemia of chronic dz (lymphocytic colitis?).  Hb 02/2020 11.5. . OSTEOPENIA 02/18/2009  Repeat DEXA 07/2014 showed osteoporosis by T score in radius but spine and hip T scores were normal-continue vit D and calcium and repeat DEXA 08/2016 unchanged.  Repeat DEXA 2 yrs. . Peripheral edema   LE's, nonpitting . Renal cyst, acquired, right   1.2 cm, 2021 . Simple hepatic cyst   x 1. 2021 . Trochanteric bursitis of both hips   inj 08/11/20 Past Surgical History: Past Surgical History: Procedure Laterality Date . ABDOMINAL HYSTERECTOMY    for DUB (ovaries are still in) . BACK SURGERY    X 5: fusion of L3-L4 through L5-S1 . CATARACT EXTRACTION Bilateral  . CHOLECYSTECTOMY  2012 . COLONOSCOPY  06/2007; 01/2015  2008 Diverticulosis, o/w normal.  2016 showed lymphocytic colitis with mild diverticulosis: pt was started on oral budesonide at that time. . CT SCAN  09/03/2017  ABDOMEN, HEAD . DEXA  08/16/2014; 09/14/16  Hip and spine ok; radius osteoporotic---but meds not indicated.  Repeat 08/2018 . ESOPHAGOGASTRODUODENOSCOPY  10/2007; 11/27/15; 04/26/20  03/2020: chronic gastritis, H pylori neg,  celiac neg.  +Barretts.  Hyperplastic gastric polyp. Marland Kitchen HIP CLOSED REDUCTION Right 09/20/2018  Procedure: CLOSED REDUCTION HIP;  Surgeon: Paralee Cancel, MD;  Location: [facility];  Service: Orthopedics;  Laterality: Right; . REVERSE SHOULDER ARTHROPLASTY Left 06/09/2019  Procedure: REVERSE SHOULDER ARTHROPLASTY;  Surgeon: Netta Cedars, MD;  Location: [facility];  Service: Orthopedics;  Laterality: Left; . SHOULDER SURGERY Right   \ . TOTAL HIP ARTHROPLASTY  05/31/2012  Procedure: TOTAL HIP ARTHROPLASTY ANTERIOR APPROACH;  Surgeon: Mauri Pole, MD;  Location: [facility];  Service: Orthopedics;  Laterality: Left; . TOTAL HIP ARTHROPLASTY Right 08/30/2018  Procedure: RIGHT TOTAL HIP ARTHROPLASTY ANTERIOR APPROACH;  Surgeon: Paralee Cancel, MD;  Location: [facility];  Service: Orthopedics;  Laterality: Right;  70 mins HPI: Patient is a 74 y.o. female with PMHx of HLD, hypothyroidism, GERD, lymphocytic colitis, anxiety/depression, chronic back pain on narcotics-presented with several days history of shortness of breath-found to have acute hypoxic respiratory failure secondary to COVID-19 pneumonia.  Patient started to develop some lethargy 9/24, progressive, with no improvement despite holding Xanax and oxycodone, MRI brain significant for acute CVA 9/26 .desat to 83 on oxygen. 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  Swallow eval ordered.  Subjective: pt awake in bed Assessment / Plan / Recommendation [facility] IP CLINICAL IMPRESSIONS 09/26/2020 Clinical Impression Severe oropharyngeal dysphagia marked by oral holding, lingual residue, inefficient posterior propulsion, late swallow initiation, decreased pharyngeal contraction. Initially able to propel nectar and first trial of puree but subsequent attempts were unsuccessful. Pt held head in an extended postion (diagnosed with cervical spinal degeneration with C3-4 and C4-5 anterolisthesis; dry spoon  presentations and verbal cues were not effective. Barium reached her pyriform sinuses for several minutes and was eventually suctioned. Swallow initiated late therefore nectar thick barium dumped to her small pyriform sinus cavities and immediately fell into trachea with immediate cough. Residue in valleculae was max with puree. Recommend continued NPO and ST in attempts to return to po's. Continue oral care.     SLP Visit Diagnosis Dysphagia, oropharyngeal phase (R13.12) Attention and concentration deficit following -- Frontal lobe and executive function deficit following -- Impact on safety and function Severe aspiration risk   [facility] IP TREATMENT RECOMMENDATION 09/26/2020 Treatment Recommendations Therapy as outlined in treatment plan below   Prognosis 09/26/2020 Prognosis for Safe Diet Advancement Fair Barriers to Reach Goals Severity of deficits Barriers/Prognosis Comment -- [facility] IP DIET RECOMMENDATION 09/26/2020 SLP Diet Recommendations NPO Liquid Administration via -- Medication Administration Via alternative means Compensations -- Postural Changes --   [facility] IP OTHER RECOMMENDATIONS 09/26/2020 Recommended Consults -- Oral Care Recommendations Oral care QID Other Recommendations --   [facility] IP FOLLOW UP RECOMMENDATIONS 09/26/2020 Follow up Recommendations Skilled Nursing facility   [facility] IP FREQUENCY AND DURATION 09/26/2020 Speech Therapy Frequency (ACUTE ONLY) min 2x/week Treatment Duration 2 weeks      [facility] IP ORAL PHASE 09/26/2020 Oral Phase Impaired Oral - Pudding Teaspoon -- Oral - Pudding Cup -- Oral - Honey Teaspoon -- Oral - Honey Cup -- Oral - Nectar Teaspoon -- Oral - Nectar Cup Delayed oral transit;Decreased bolus cohesion;Holding of bolus;Reduced posterior propulsion;Lingual/palatal residue Oral - Nectar Straw -- Oral - Thin Teaspoon -- Oral - Thin Cup -- Oral - Thin Straw -- Oral - Puree Holding of bolus;Delayed oral transit;Weak lingual manipulation;Reduced posterior propulsion;Lingual/palatal residue Oral - Mech  Soft -- Oral - Regular -- Oral - Multi-Consistency -- Oral - Pill --  Oral Phase - Comment --  [facility] IP PHARYNGEAL PHASE 09/26/2020 Pharyngeal Phase Impaired Pharyngeal- Pudding Teaspoon -- Pharyngeal -- Pharyngeal- Pudding Cup -- Pharyngeal -- Pharyngeal- Honey Teaspoon -- Pharyngeal -- Pharyngeal- Honey Cup -- Pharyngeal -- Pharyngeal- Nectar Teaspoon -- Pharyngeal -- Pharyngeal- Nectar Cup Delayed swallow initiation-pyriform sinuses;Pharyngeal residue - valleculae;Pharyngeal residue - pyriform Pharyngeal -- Pharyngeal- Nectar Straw -- Pharyngeal -- Pharyngeal- Thin Teaspoon -- Pharyngeal -- Pharyngeal- Thin Cup -- Pharyngeal -- Pharyngeal- Thin Straw -- Pharyngeal -- Pharyngeal- Puree Delayed swallow initiation-vallecula;Reduced pharyngeal peristalsis;Pharyngeal residue - pyriform;Reduced tongue base retraction Pharyngeal -- Pharyngeal- Mechanical Soft -- Pharyngeal -- Pharyngeal- Regular -- Pharyngeal -- Pharyngeal- Multi-consistency -- Pharyngeal -- Pharyngeal- Pill -- Pharyngeal -- Pharyngeal Comment --  CHL IP CERVICAL ESOPHAGEAL PHASE 09/26/2020 Cervical Esophageal Phase WFL Pudding Teaspoon -- Pudding Cup -- Honey Teaspoon -- Honey Cup -- Nectar Teaspoon -- Nectar Cup -- Nectar Straw -- Thin Teaspoon -- Thin Cup -- Thin Straw -- Puree -- Mechanical Soft -- Regular -- Multi-consistency -- Pill -- Cervical Esophageal Comment -- [place] Siren 09/26/2020, 4:48 PM Orbie Pyo Colvin Caroli.Ed Actor Pager [phone] Office [phone]              ECHOCARDIOGRAM COMPLETE  Result Date: 09/23/2020    ECHOCARDIOGRAM REPORT   Patient Name:   GEARLDINE LOONEY Date of Exam: 09/23/2020 Medical Rec #:  [id number]       Height:       64.0 in Accession #:    [id number]      Weight:       145.9 lb Date of Birth:  08-Jun-1946       BSA:          33.711 m Patient Age:    40 years        BP:           139/82 mmHg Patient Gender: F               HR:           84 bpm. Exam Location:  Inpatient Procedure: 2D  Echo, Cardiac Doppler and Color Doppler Indications:    Stroke 434.91 / I163.9  History:        Patient has no prior history of Echocardiogram examinations.                 Signs/Symptoms:Murmur; Risk Factors:Dyslipidemia. COVID-19                 Positive. CKD. GERD. Hypothyroidism.  Sonographer:    Jonelle Sidle Dance Referring Phys: [id number]  1. Left ventricular ejection fraction, by estimation, is 65 to 70%. The left ventricle has normal function. The left ventricle has no regional wall motion abnormalities. There is mild concentric left ventricular hypertrophy. Left ventricular diastolic parameters are consistent with Grade I diastolic dysfunction (impaired relaxation).  2. Right ventricular systolic function is normal. The right ventricular size is normal.  3. Left atrial size was mildly dilated.  4. The mitral valve is normal in structure. Mild mitral valve regurgitation. No evidence of mitral stenosis. Moderate mitral annular calcification.  5. The aortic valve is normal in structure. Aortic valve regurgitation is not visualized. No aortic stenosis is present.  6. The inferior vena cava is normal in size with greater than 50% respiratory variability, suggesting right atrial pressure of 3 mmHg. Comparison(s): No prior Echocardiogram. Conclusion(s)/Recommendation(s): No intracardiac source of embolism detected on this transthoracic study. A transesophageal echocardiogram is recommended to exclude  cardiac source of embolism if clinically indicated. FINDINGS  Left Ventricle: Left ventricular ejection fraction, by estimation, is 65 to 70%. The left ventricle has normal function. The left ventricle has no regional wall motion abnormalities. The left ventricular internal cavity size was normal in size. There is  mild concentric left ventricular hypertrophy. Left ventricular diastolic parameters are consistent with Grade I diastolic dysfunction (impaired relaxation). Normal left ventricular filling  pressure. Right Ventricle: The right ventricular size is normal. No increase in right ventricular wall thickness. Right ventricular systolic function is normal. Left Atrium: Left atrial size was mildly dilated. Right Atrium: Right atrial size was normal in size. Pericardium: There is no evidence of pericardial effusion. Mitral Valve: The mitral valve is normal in structure. There is mild thickening of the mitral valve leaflet(s). There is mild calcification of the mitral valve leaflet(s). Moderate mitral annular calcification. Mild mitral valve regurgitation. No evidence of mitral valve stenosis. Tricuspid Valve: The tricuspid valve is normal in structure. Tricuspid valve regurgitation is mild . No evidence of tricuspid stenosis. Aortic Valve: The aortic valve is normal in structure. Aortic valve regurgitation is not visualized. No aortic stenosis is present. Pulmonic Valve: The pulmonic valve was normal in structure. Pulmonic valve regurgitation is not visualized. No evidence of pulmonic stenosis. Aorta: The aortic root is normal in size and structure. Venous: The inferior vena cava is normal in size with greater than 50% respiratory variability, suggesting right atrial pressure of 3 mmHg. IAS/Shunts: No atrial level shunt detected by color flow Doppler.  LEFT VENTRICLE PLAX 2D LVIDd:         3.80 cm  Diastology LVIDs:         2.40 cm  LV e' lateral:   9.46 cm/s LV PW:         1.10 cm  LV E/e' lateral: 8.7 LV IVS:        1.10 cm LVOT diam:     2.10 cm LV SV:         105 LV SV Index:   61 LVOT Area:     3.46 cm  RIGHT VENTRICLE             IVC RV Basal diam:  2.00 cm     IVC diam: 1.20 cm RV S prime:     13.90 cm/s TAPSE (M-mode): 1.5 cm LEFT ATRIUM             Index       RIGHT ATRIUM          Index LA diam:        4.00 cm 2.34 cm/m  RA Area:     8.10 cm LA Vol (A2C):   48.1 ml 28.11 ml/m RA Volume:   13.00 ml 7.60 ml/m LA Vol (A4C):   34.5 ml 20.16 ml/m LA Biplane Vol: 42.3 ml 24.72 ml/m  AORTIC VALVE LVOT  Vmax:   148.00 cm/s LVOT Vmean:  98.300 cm/s LVOT VTI:    0.302 m  AORTA Ao Root diam: 3.20 cm Ao Asc diam:  3.10 cm MITRAL VALVE MV Area (PHT): 2.53 cm     SHUNTS MV Decel Time: 300 msec     Systemic VTI:  0.30 m MV E velocity: 81.90 cm/s   Systemic Diam: 2.10 cm MV A velocity: 133.00 cm/s MV E/A ratio:  0.62 Ena Dawley MD Electronically signed by Ena Dawley MD Signature Date/Time: 09/23/2020/5:19:20 PM    Final

## 2020-10-03 DIAGNOSIS — J9601 Acute respiratory failure with hypoxia: Secondary | ICD-10-CM | POA: Diagnosis not present

## 2020-10-03 DIAGNOSIS — U071 COVID-19: Secondary | ICD-10-CM | POA: Diagnosis not present

## 2020-10-03 DIAGNOSIS — J1282 Pneumonia due to Coronavirus disease 2019: Secondary | ICD-10-CM | POA: Diagnosis not present

## 2020-10-03 DIAGNOSIS — R1312 Dysphagia, oropharyngeal phase: Secondary | ICD-10-CM | POA: Diagnosis not present

## 2020-10-03 LAB — BASIC METABOLIC PANEL
Anion gap: 10 (ref 5–15)
BUN: 19 mg/dL (ref 8–23)
CO2: 24 mmol/L (ref 22–32)
Calcium: 8.7 mg/dL — ABNORMAL LOW (ref 8.9–10.3)
Chloride: 97 mmol/L — ABNORMAL LOW (ref 98–111)
Creatinine, Ser: 0.63 mg/dL (ref 0.44–1.00)
GFR calc non Af Amer: >60 mL/min (ref >60)
Glucose, Bld: 127 mg/dL — ABNORMAL HIGH (ref 70–99)
Potassium: 4.5 mmol/L (ref 3.5–5.1)
Sodium: 131 mmol/L — ABNORMAL LOW (ref 135–145)

## 2020-10-03 LAB — GLUCOSE, CAPILLARY
Glucose-Capillary: 121 mg/dL — ABNORMAL HIGH (ref 70–99)
Glucose-Capillary: 129 mg/dL — ABNORMAL HIGH (ref 70–99)
Glucose-Capillary: 131 mg/dL — ABNORMAL HIGH (ref 70–99)
Glucose-Capillary: 124 mg/dL — ABNORMAL HIGH (ref 70–99)
Glucose-Capillary: 156 mg/dL — ABNORMAL HIGH (ref 70–99)
Glucose-Capillary: 116 mg/dL — ABNORMAL HIGH (ref 70–99)
Glucose-Capillary: 143 mg/dL — ABNORMAL HIGH (ref 70–99)
Glucose-Capillary: 141 mg/dL — ABNORMAL HIGH (ref 70–99)

## 2020-10-03 NOTE — TOC Progression Note (Signed)
Transition of Care [facility]) - Progression Note    Patient Details  Name: April Brewer MRN: [id number] Date of Birth: 1946-02-23  Transition of Care [facility]) CM/SW [place], RN Phone Number: 10/03/2020, 2:06 PM  Clinical Narrative:    Case management continuing to follow patient for SNF placement.  The patient is not a CIR candidate for admission but will need SNF placement once her Cortrak feeding tube is discontinued.  The patient will be discontinued from her COVID isolation status on 10/04/2020.  I updated the patient's FL2 to reflect need for speech therapy, discountenance of COVID isolation starting 10/04/20, updated new medications on MAR, need for continued oxygen via nasal cannula and any other needed changes in care in hopes to give family choice for SNF placement once patient is medically stable for transfer.  Will continue to monitor patient for SNF placement once Cortrak is not needed.   Expected Discharge Plan: Skilled Nursing Facility Barriers to Discharge: SNF Pending bed offer  Expected Discharge Plan and Services Expected Discharge Plan: [place] Choice: [place] arrangements for the past 2 months: Single Family Home                                       Social Determinants of Health (SDOH) Interventions    Readmission Risk Interventions No flowsheet data found.

## 2020-10-03 NOTE — NC FL2 (Signed)
[place] LEVEL OF CARE SCREENING TOOL     IDENTIFICATION  Patient Name: April Brewer Birthdate: 28-Mar-1946 Sex: female Admission Date (Current Location): 09/22/2020  [facility] and [place] Number:  Herbalist and Address:  [facility] [place] [address] [place], [place] [address]      Provider Number: [id number]  Attending Physician Name and Address:  Bonnielee Haff, MD  Relative Name and Phone Number:  Reily, Treloar [phone] [facility])    Current Level of Care: Hospital Recommended Level of Care: [place] Prior Approval Number:    Date Approved/Denied:   PASRR Number: [id number]  Discharge Plan: SNF    Current Diagnoses: Patient Active Problem List   Diagnosis Date Noted  . Cerebral thrombosis with cerebral infarction 09/22/2020  . Acute respiratory failure with hypoxia ([place]) 09/14/2020  . Elevated AST (SGOT) 09/14/2020  . Hypocalcemia 09/14/2020  . Pneumonia due to COVID-19 virus 09/01/2020  . Degeneration of lumbar intervertebral disc 11/16/2019  . Long-term current use of opiate analgesic 11/16/2019  . Pain in right knee 07/03/2019  . H/O total shoulder replacement, left 06/09/2019  . Hip dislocation, right, initial encounter ([place]) 09/20/2018  . 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 Osteoarthritis of right hip 02/17/2018  . Chronic neck pain 01/03/2018  . Normocytic anemia 02/13/2016  . Lymphocytic colitis 03/14/2015  . Dyspnea 01/11/2014  . Venous insufficiency 08/23/2013  . Peripheral edema 08/23/2013  . Anxiety and depression 08/23/2013  . Hyponatremia 05/19/2013  . Cough, persistent 08/31/2012  . 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 OSTEOPENIA 02/18/2009  . Osteochondropathy 02/18/2009  . GOITER 06/02/2007  . Hypothyroidism 06/02/2007  . DEPRESSION 06/02/2007  . HYPERTENSION 06/02/2007  . ALLERGIC RHINITIS 06/02/2007  . GERD 06/02/2007  . INSOMNIA 06/02/2007    Orientation  RESPIRATION BLADDER Height & Weight     Self, Time, Situation, Place  O2 (3 liters/[place] - (OFF COVID isolation on 10/04/2020)) External catheter Weight: 64.7 kg Height:  5\' 4"  (162.6 cm)  BEHAVIORAL SYMPTOMS/MOOD NEUROLOGICAL BOWEL NUTRITION STATUS   (none)  (none) Incontinent Diet (See Discharge Summary - Cortrak will be discontinued prior to admission to SNF)  AMBULATORY STATUS COMMUNICATION OF NEEDS Skin   Extensive Assist Verbally Normal                       Personal Care Assistance Level of Assistance  Dressing, Feeding, Bathing Bathing Assistance: Maximum assistance Feeding assistance: Independent Dressing Assistance: Maximum assistance     Functional Limitations Info  Sight, Hearing, Speech Sight Info: Adequate Hearing Info: Adequate Speech Info: Impaired    SPECIAL CARE FACTORS FREQUENCY  PT (By licensed PT), OT (By licensed OT), Speech therapy     PT Frequency: 5 x per week OT Frequency: 5 x per week     Speech Therapy Frequency: per orders      Contractures Contractures Info: Not present    Additional Factors Info  Code Status, Allergies, Psychotropic (COVID isolation discontinued at 10/04/2020) Code Status Info: DNR Allergies Info: Latex Psychotropic Info: Lamictal, Ativan, Zoloft   Isolation Precautions Info: Off COVID isolation on 10/04/2020 - unvaccinated for COVID     Current Medications (10/03/2020):  This is the current hospital active medication list Current Facility-Administered Medications  Medication Dose Route Frequency Provider Last Rate Last Admin  . 0.9 %  sodium chloride infusion   Intravenous PRN Norval Morton, MD   Stopped at 09/15/20 0004  . 0.9 %  sodium chloride infusion   Intravenous PRN Norval Morton, MD   Stopped at 09/14/20 2233  . acetaminophen (TYLENOL) tablet 650 mg  650 mg Oral Q6H PRN Fuller Plan A, MD   650 mg at 09/14/20 2005  . acidophilus (RISAQUAD) capsule 1 capsule  1 capsule Oral Daily Tamala Julian, Rondell A, MD   1  capsule at 10/03/20 1009  . albuterol (VENTOLIN HFA) 108 (90 Base) MCG/ACT inhaler 2 puff  2 puff Inhalation Q6H Smith, Rondell A, MD   2 puff at 10/03/20 1306  . aspirin tablet 325 mg  325 mg Per Tube Daily Bonnielee Haff, MD   325 mg at 10/03/20 1009  . atorvastatin (LIPITOR) tablet 10 mg  10 mg Per Tube Daily Bonnielee Haff, MD   10 mg at 10/03/20 1009  . chlorpheniramine-HYDROcodone (TUSSIONEX) 10-8 MG/5ML suspension 5 mL  5 mL Oral Q12H PRN Fuller Plan A, MD   5 mL at 09/16/20 0034  . clopidogrel (PLAVIX) tablet 75 mg  75 mg Per Tube Daily Bonnielee Haff, MD   75 mg at 10/03/20 1009  . enoxaparin (LOVENOX) injection 40 mg  40 mg Subcutaneous Q24H Smith, Rondell A, MD   40 mg at 10/03/20 1009  . famotidine (PEPCID) 40 MG/5ML suspension 20 mg  20 mg Per Tube Daily Bonnielee Haff, MD   20 mg at 10/03/20 1010  . feeding supplement (OSMOLITE 1.5 CAL) liquid 1,000 mL  1,000 mL Per Tube Continuous Bonnielee Haff, MD 55 mL/hr at 10/02/20 1719 1,000 mL at 10/02/20 1719  . feeding supplement (PROSource TF) liquid 45 mL  45 mL Per Tube BID Bonnielee Haff, MD   45 mL at 10/03/20 1010  . free water 100 mL  100 mL Per Tube Q6H Bonnielee Haff, MD   100 mL at 10/03/20 1307  . guaiFENesin-dextromethorphan (ROBITUSSIN DM) 100-10 MG/5ML syrup 10 mL  10 mL Oral Q4H PRN Fuller Plan A, MD   10 mL at 09/15/20 2122  . influenza vaccine adjuvanted (FLUAD) injection 0.5 mL  0.5 mL Intramuscular Tomorrow-1000 Elgergawy, Silver Huguenin, MD      . lamoTRIgine (LAMICTAL) tablet 100 mg  100 mg Per Tube Daily Bonnielee Haff, MD   100 mg at 10/03/20 1009  . levothyroxine (SYNTHROID) tablet 100 mcg  100 mcg Per Tube [id number] Bonnielee Haff, MD   100 mcg at 10/03/20 0639  . LORazepam (ATIVAN) injection 0.5 mg  0.5 mg Intravenous Q6H PRN Jonetta Osgood, MD      . MEDLINE mouth rinse  15 mL Mouth Rinse BID Tamala Julian, Rondell A, MD   15 mL at 10/03/20 1010  . metoprolol tartrate (LOPRESSOR) tablet 25 mg  25 mg Per Tube BID  Bonnielee Haff, MD   25 mg at 10/03/20 1012  . morphine 2 MG/ML injection 1 mg  1 mg Intravenous Q4H PRN Jonetta Osgood, MD      . ondansetron [facility]) tablet 4 mg  4 mg Oral Q6H PRN Fuller Plan A, MD   4 mg at 09/14/20 2011   Or  . ondansetron (ZOFRAN) injection 4 mg  4 mg Intravenous Q6H PRN Fuller Plan A, MD   4 mg at 09/17/20 1022  . phenol (CHLORASEPTIC) mouth spray 1 spray  1 spray Mouth/Throat PRN Fuller Plan A, MD   1 spray at 09/24/20 1319  . Resource ThickenUp Clear   Oral PRN Bonnielee Haff, MD      . sertraline (ZOLOFT) tablet 100 mg  100 mg Oral BID Ghimire,  Henreitta Leber, MD   100 mg at 10/03/20 1009  . sodium chloride flush (NS) 0.9 % injection 10-40 mL  10-40 mL Intracatheter Q12H Elgergawy, Silver Huguenin, MD   10 mL at 10/03/20 1013  . sodium chloride flush (NS) 0.9 % injection 10-40 mL  10-40 mL Intracatheter PRN Elgergawy, Silver Huguenin, MD      . sodium chloride flush (NS) 0.9 % injection 3 mL  3 mL Intravenous Q12H Smith, Rondell A, MD   3 mL at 10/03/20 1012     Discharge Medications: Please see discharge summary for a list of discharge medications.  Relevant Imaging Results:  Relevant Lab Results:   Additional Information SSN [id number]  Curlene Labrum, RN

## 2020-10-03 NOTE — Progress Notes (Signed)
  Speech Language Pathology Treatment: Dysphagia  Patient Details Name: April Brewer MRN: [id number] DOB: Jun 20, 1946 Today's Date: 10/03/2020 Time: 1215-1230 SLP Time Calculation (min) (ACUTE ONLY): 15 min  Assessment / Plan / Recommendation Clinical Impression  Pt was seen for dysphagia treatment following MBS. She reports that she has a limited appetite and hasn't been eating much. She continues to demonstrate oral holding, an audible swallow, suspected delayed swallow initiation, and changes in respiration. Following a cup sip of nectar-thick liquid, she demonstrated an immediate cough. Pt tolerated straw sips without s/sx of aspiration. However, following a straw sip of nectar she expectorated a small amount of the bolus that she was holding in the oral cavity. Pt was educated on the importance of initiating a swallow after intake and not holding it in the oral cavity. SLP will continue to f/u acutely.    HPI HPI: Patient is a 74 y.o. female with PMHx of HLD, hypothyroidism, GERD, lymphocytic colitis, anxiety/depression, chronic back pain on narcotics-presented with several days history of shortness of breath-found to have acute hypoxic respiratory failure secondary to COVID-19 pneumonia.  Patient started to develop some lethargy 9/24, progressive, with no improvement despite holding Xanax and oxycodone, MRI brain significant for acute CVA 9/26 .desat to 83 on oxygen. 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        SLP Plan  Continue with current plan of care       Recommendations  Diet recommendations: Nectar-thick liquid Liquids provided via: Straw Medication Administration: Via alternative means Supervision: Full supervision/cueing for compensatory strategies Compensations: Slow rate;Small sips/bites Postural Changes and/or Swallow Maneuvers: Seated upright 90 degrees                Oral  Care Recommendations: Oral care QID Follow up Recommendations: Inpatient Rehab SLP Visit Diagnosis: Dysphagia, oropharyngeal phase (R13.12) Plan: Continue with current plan of care       GO                Greggory Keen 10/03/2020, 12:53 PM

## 2020-10-03 NOTE — Progress Notes (Signed)
PROGRESS NOTE                                                                                                                                                                                                             Patient Demographics:    April Brewer, is a 74 y.o. female, DOB - 16-Jul-1946, TMH:[id number]  Outpatient Primary MD for the patient is McGowen, Adrian Blackwater, MD   Admit date - 09/24/2020   LOS - 54  Chief Complaint  Patient presents with  . Shortness of Breath       Brief Narrative: Patient is a 74 y.o. female with PMHx of HLD, hypothyroidism, GERD, lymphocytic colitis, anxiety/depression, chronic back pain on narcotics-presented with several days history of shortness of breath-found to have acute hypoxic respiratory failure secondary to COVID-19 pneumonia.  Patient was admitted to the hospitalist service-started on COVID-19 treatment-unfortunately further hospital course complicated by acute CVA.  See below for further details.    COVID-19 vaccinated status: Unvaccinated  Significant Events: 9/18>> Admit to [facility] for severe hypoxia due to COVID-19 pneumonia 9/26>> worsening lethargy-MRI brain +ve for acute CVA  Significant studies: 9/17>>Chest x-ray: Diffuse interstitial and groundglass opacities 9/26>> MRI brain: Multifocal acute infarcts in the posterior circulation. 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  Could not sleep well overnight but unable to tell me why.  No chest pain.  No nausea vomiting.   Assessment  & Plan :   Acute Hypoxic Resp Failure due to Covid 19 Viral pneumonia Had severe hypoxemia on initial presentation requiring 15 L of HFNC. Patient has completed course of Remdesivir. Remains on steroids which is being tapered down. Patient was also on baricitinib which had to be held due to dysphagia from acute stroke. Continue incentive spirometry and mobilization. D-dimer improved to 0.81. CRP improved as well. Patient's respiratory status  remains stable.  She is saturating in the early 90s on 3 L of oxygen.    Acute ischemic CVA Felt to be due to hypercoagulable state in the setting of COVID-19. Telemetry without any arrhythmias. Echo without any obvious embolic source.   Followed by PT OT and SLP.  Cortrak feeding tube was placed and tube feedings were initiated.  She will need to be on aspirin and Plavix for 3 months followed by aspirin alone.    Oropharyngeal dysphagia Did not do well with the modified barium swallow on 9/30.  Cortrak tube was placed. Tube feedings initiated.   Speech therapy continues to follow.  Modified barium swallow repeated yesterday afternoon.  Patient placed on a dysphagia 1 diet.  Will wait for follow-up from speech therapy.  If she seems to be doing well then we can hopefully discontinue her NG tube in the next 24 to 48 hours.    PSVT Noted on tele. Patient asymptomatic. Recent echo reviewed. TSH was normal, 1.86 on 8/30.  Patient was started on metoprolol.  No further episodes noted.  Monitor electrolytes periodically.     Hypernatremia Likely due to free water deficit from lack of oral intake.  After NG tube was placed she was started on free water with a significant drop in sodium levels.  We had cut back on the dose of the free water and stopped the D5 infusion.  Sodium remains  stable.  Hypokalemia Improved.  Transaminitis Improved-secondary to COVID-19.  HLD LDL noted to be 72.  Continue statin.  Hypothyroidism Continue Synthroid.  Anxiety/depression Seems to be stable.   Noted to be on lamotrigine and Zoloft  Chronic back pain Patient on chronic narcotics at home.  Goals of care:  Done by previous rounding physician. Currently DNR.   GI prophylaxis:PPI  Consults: Neurology  Family Communication  :  Son (Thomas [phone]).  Son being updated daily.    Code Status :   DNR  Diet :  Diet Order            DIET - DYS 1 Room service appropriate? Yes; Fluid consistency: Nectar Thick  Diet effective now                  Disposition Plan  : Physical therapy has changed the recommendation from [facility] to SNF as they do not think that the patient will be able to tolerate the intensive therapy at [facility].  Social worker is following.  We will need to address the NG tube before she can be discharged.    COVID-19 positive test was on 9/17.  Will complete 3 weeks on October 7.  Should be able to discontinue airborne and contact isolation effective October 8.  Status is: Inpatient  Remains inpatient appropriate because:Inpatient level of care appropriate due to severity of illness   Dispo: The patient is from: Home              Anticipated d/c is to: SNF versus [facility].              Anticipated d/c date is: > 3 days              Patient currently is not medically stable to d/c.    Antimicorbials  :    Anti-infectives (From admission, onward)   Start     Dose/Rate Route Frequency Ordered Stop   09/14/20 2200  cefTRIAXone (ROCEPHIN) 2 g in sodium chloride 0.9 % 100 mL IVPB  Status:  Discontinued        2 g 200 mL/hr over 30 Minutes Intravenous Every 24 hours 09/14/20 2043 09/15/20 1102   09/14/20 2200  azithromycin (ZITHROMAX) 500 mg in sodium chloride 0.9 % 250 mL IVPB  Status:  Discontinued        500 mg 250 mL/hr over 60 Minutes Intravenous  Every 24 hours 09/14/20 2043 09/15/20 1102   09/14/20 1000  remdesivir 100 mg in sodium chloride 0.9 % 100 mL IVPB       "Followed by" Linked Group Details   100 mg 200 mL/hr over 30 Minutes Intravenous Daily 09/21/2020 2306 09/17/20 0907   09/14/2020 2330  remdesivir 100 mg in sodium chloride 0.9 % 100 mL IVPB       "Followed by" Linked Group Details   100 mg 200 mL/hr over 30 Minutes Intravenous Every 30 min 09/04/2020 2306 09/14/20 0033      Inpatient Medications  Scheduled Meds: . acidophilus  1 capsule Oral Daily  . albuterol  2 puff Inhalation Q6H  . aspirin  325 mg Per Tube Daily  . atorvastatin  10 mg Per Tube Daily  . clopidogrel  75 mg Per Tube Daily  . enoxaparin (LOVENOX) injection  40 mg Subcutaneous Q24H  . famotidine  20 mg Per Tube Daily  . feeding supplement (PROSource TF)  45 mL Per Tube BID  . free water  100 mL Per Tube Q6H  . influenza vaccine adjuvanted  0.5 mL Intramuscular Tomorrow-1000  . lamoTRIgine  100 mg Per Tube Daily  . levothyroxine  100 mcg Per Tube Q0600  . mouth rinse  15 mL Mouth Rinse BID  . metoprolol tartrate  25 mg Per Tube BID  . sertraline  100 mg Oral BID  . sodium chloride flush  10-40 mL Intracatheter Q12H  . sodium chloride flush  3 mL Intravenous Q12H   Continuous Infusions: . sodium chloride Stopped (09/15/20 0004)  . sodium chloride Stopped (09/14/20 2233)  . feeding supplement (OSMOLITE 1.5 CAL) 1,000 mL (10/02/20 1719)   PRN Meds:.sodium chloride, sodium chloride, acetaminophen, chlorpheniramine-HYDROcodone, guaiFENesin-dextromethorphan, LORazepam, morphine injection, ondansetron **OR** ondansetron (ZOFRAN) IV, phenol, Resource ThickenUp Clear, sodium chloride flush    Bonnielee Haff M.D on 10/03/2020 at 10:18 AM  To page go to [email or web address] - use universal password  [facility] -  Office  [phone]    Objective:   Vitals:   10/02/20 1600 10/02/20 1931 10/03/20 0404 10/03/20 0802  BP: 120/72 119/76 129/72  119/78  Pulse: 80 82 91 85  Resp: 18 20 20 18   Temp: 97.7 F (36.5 C) 98.4 F (36.9 C) 97.7 F (36.5 C) 97.8 F (36.6 C)  TempSrc: Oral Oral Axillary Oral  SpO2: 93% 92% 96% 95%  Weight:   64.7 kg   Height:        Wt Readings from Last 3 Encounters:  10/03/20 64.7 kg  09/22/20 66.2 kg  09/04/20 69.2 kg     Intake/Output Summary (Last 24 hours) at 10/03/2020 1018 Last data filed at 10/03/2020 0600 Gross per 24 hour  Intake 1859 ml  Output --  Net 1859 ml     Physical Exam  General appearance: Awake alert.  In no distress Resp: Coarse breath sounds with a few crackles at the bases.  Normal effort Cardio: S1-S2 is normal regular.  No S3-S4.  No rubs murmurs or bruit GI: Abdomen is soft.  Nontender nondistended.  Bowel sounds are present normal.  No masses organomegaly Extremities: No edema.  Able to move all her extremities.      Data Review:    CBC Recent Labs  Lab 09/26/20 1021 09/27/20 0604  WBC 13.5* 14.3*  HGB 12.1 12.3  HCT 37.4 39.0  PLT 404* 380  MCV 95.2 94.9  MCH 30.8 29.9  MCHC 32.4 31.5  RDW 15.7* 15.6*    Chemistries  Recent Labs  Lab 09/26/20 1021 09/26/20 1021 09/27/20 0604 09/27/20 0604 09/28/20 0500 09/28/20 0500 09/29/20 0500 09/30/20 0500 10/01/20 0448 10/02/20 0440 10/03/20 0246  NA 152*   < > 149*   < > 140   < > 134* 131* 132* 133* 131*  K 3.4*   < > 3.8   < > 3.6   < > 3.8 4.1 4.5 4.6 4.5  CL 117*   < > 111   < > 108   < > 101 98 99 99 97*  CO2 22   < > 25   < > 23   < > 24 30 23 24 24   GLUCOSE 115*   < > 128*   < > 153*   < > 152* 136* 111* 98 127*  BUN 22   < > 18   < > 17   < > 14 15 15 16 19   CREATININE 0.83   < > 0.85   < > 0.80   < > 0.73 0.70 0.67 0.67 0.63  CALCIUM 8.9   < > 9.2   < > 8.5*   < > 8.4* 8.1* 8.5* 8.8* 8.7*  MG  --   --   --   --  2.3  --   --  1.7 2.0  --   --   AST 30  --  33  --   --   --   --   --   --   --   --   ALT 28  --  27  --   --   --   --   --   --   --   --   ALKPHOS 72  --  77  --    --   --   --   --   --   --   --   BILITOT 1.1  --  1.2  --   --   --   --   --   --   --   --    < > = values in this interval not displayed.    Micro Results No results found for this or any previous visit (from the past 240 hour(s)).  Radiology Reports CT ANGIO HEAD W OR WO CONTRAST  Result Date: 09/24/2020 CLINICAL DATA:  Posterior circulation stroke.  COVID positive. EXAM: CT ANGIOGRAPHY HEAD AND NECK TECHNIQUE: Multidetector CT imaging of the head and neck was performed using the standard protocol during bolus administration of intravenous contrast. Multiplanar CT image reconstructions and MIPs were obtained to evaluate the vascular anatomy. Carotid stenosis measurements (when applicable) are obtained utilizing NASCET criteria, using the distal internal carotid diameter as the denominator. CONTRAST:  35mL OMNIPAQUE IOHEXOL 350 MG/ML SOLN COMPARISON:  MRI 09/22/2020 FINDINGS: CTA NECK FINDINGS Aortic arch: Visualized portions demonstrate atherosclerosis without significant stenosis or aneurysm. Right carotid system: No evidence of dissection, stenosis (50% or greater) or occlusion. Left carotid system: No evidence of dissection, stenosis (50% or greater) or occlusion. Vertebral arteries: Right dominant. There is abrupt occlusion of the left vertebral artery at the level of  C2. Reconstitution immediately before the vertebral artery courses intradural E, which may be from retrograde flow and/or collaterals. Skeleton: Multilevel severe degenerative disc disease in the cervical spine. Other neck: No mass lesion or adenopathy. Upper chest: Peripheral predominant ground-glass opacities in the imaged lung apices, compatible with COVID pneumonia. Review of the MIP images confirms the above findings CTA HEAD FINDINGS Anterior circulation: No significant stenosis, proximal occlusion, aneurysm, or vascular malformation. Posterior circulation: Abrupt inclusion of the distal right P2 PCA (series 5, image 448).  a Venous sinuses: As permitted by contrast timing, patent. Additional comments: Partially imaged edema associated with multifocal posterior circulation infarcts that were better characterized on recent MRI. Review of the MIP images confirms the above findings IMPRESSION: 1. 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 2. Partially imaged edema associated with multifocal posterior circulation infarcts that were better characterized on recent MRI. Non-contrast head CT could further evaluate the degree of edema/mass effect if clinically indicated. 3. Peripheral predominant ground-glass opacities in the imaged lung apices, compatible with COVID pneumonia given the clinical history. These results will be called to the ordering clinician or representative by the Radiologist Assistant, and communication documented in the PACS or [employer]. Electronically Signed   By: Margaretha Sheffield MD   On: 09/24/2020 09:45   CT ANGIO NECK W OR WO CONTRAST  Result Date: 09/24/2020 CLINICAL DATA:  Posterior circulation stroke.  COVID positive. EXAM: CT ANGIOGRAPHY HEAD AND NECK TECHNIQUE: Multidetector CT imaging of the head and neck was performed using the standard protocol during bolus administration of intravenous contrast. Multiplanar CT image reconstructions and MIPs were obtained to evaluate the vascular anatomy. Carotid stenosis measurements (when applicable) are obtained utilizing NASCET criteria, using the distal internal carotid diameter as the denominator. CONTRAST:  90mL OMNIPAQUE IOHEXOL 350 MG/ML SOLN COMPARISON:  MRI 09/22/2020 FINDINGS: CTA NECK FINDINGS Aortic arch: Visualized portions demonstrate atherosclerosis without significant stenosis or aneurysm. Right carotid system: No evidence of dissection, stenosis (50% or greater) or occlusion. Left carotid system: No evidence of dissection, stenosis (50% or greater) or occlusion. Vertebral arteries:  Right dominant. There is abrupt occlusion of the left vertebral artery at the level of C2. Reconstitution immediately before the vertebral artery courses intradural E, which may be from retrograde flow and/or collaterals. Skeleton: Multilevel severe degenerative disc disease in the cervical spine. Other neck: No mass lesion or adenopathy. Upper chest: Peripheral predominant ground-glass opacities in the imaged lung apices, compatible with COVID pneumonia. Review of the MIP images confirms the above findings CTA HEAD FINDINGS Anterior circulation: No significant stenosis, proximal occlusion, aneurysm, or vascular malformation. Posterior circulation: Abrupt inclusion of the distal right P2 PCA (series 5, image 448). a Venous sinuses: As permitted by contrast timing, patent. Additional comments: Partially imaged edema associated with multifocal posterior circulation infarcts that were better characterized on recent MRI. Review of the MIP images confirms the above findings IMPRESSION: 1. 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 2. Partially imaged edema associated with multifocal posterior circulation infarcts that were better characterized on recent MRI. Non-contrast head CT could further evaluate the degree of edema/mass effect if clinically indicated. 3. Peripheral predominant ground-glass opacities in the imaged lung apices, compatible with COVID pneumonia given the clinical history. These results will be called to the ordering clinician or representative by the Radiologist Assistant, and communication documented in the PACS  or [employer]. Electronically Signed   By: Margaretha Sheffield MD   On: 09/24/2020 09:45   MR BRAIN WO CONTRAST  Result Date: 09/22/2020 CLINICAL DATA:  Delirium.  COVID positive EXAM: MRI HEAD WITHOUT CONTRAST TECHNIQUE: Multiplanar, multiecho pulse sequences of the brain and surrounding structures were obtained without  intravenous contrast. COMPARISON:  None. FINDINGS: Brain: Extensive acute infarction in the posterior circulation, patchily present throughout the left more than right cerebellum with right cerebellar involvement mainly at the superior cerebellar distribution and left-sided involvement both at the PICA and superior cerebellar arteries. Right more than left PCA involvement involving the bilateral thalamus and right more than left occipital cortex. Right more than left hippocampal region involvement. No anterior circulation infarct is seen. No hemorrhage, hydrocephalus, or masslike finding. Mild chronic small vessel disease. Cerebral volume loss. Vascular: A right vertebral and basilar flow void or visible. The left vertebral artery could be hypoplastic or diseased. Skull and upper cervical spine: Normal marrow signal. Cervical spine degeneration with C3-4 and C4-5 anterolisthesis Sinuses/Orbits: Bilateral cataract resection. Nasal septal perforation. Other: Progressively motion degraded study ASAP these results will be called to the ordering clinician or representative by the Radiologist Assistant, and communication documented in the PACS or [employer]. 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 Electronically Signed   By: Monte Fantasia M.D.   On: 09/22/2020 11:45   DG Chest Portable 1 View  Result Date: 09/10/2020 CLINICAL DATA:  Cough, congestion, fever, loss of taste and smell for 1 week EXAM: PORTABLE CHEST 1 VIEW COMPARISON:  01/11/2014 FINDINGS: Single frontal view of the chest demonstrates stable enlarged cardiac silhouette. There is diffuse increased interstitial prominence with bilateral ground-glass consolidation. No effusion or pneumothorax. No acute bony abnormalities. Bilateral shoulder arthroplasties. IMPRESSION: 1. Diffuse interstitial and ground-glass opacities, consistent with bilateral atypical pneumonia given clinical history. Electronically Signed    By: Randa Ngo M.D.   On: 09/10/2020 23:33   DG Swallowing Func-Speech Pathology  Result Date: 10/02/2020 Completed and documented by Greggory Keen, SLP student Supervised and reviewed by Herbie Baltimore MA CCC-SLP Objective Swallowing Evaluation: Type of Study: MBS-Modified Barium Swallow Study  Patient Details Name: DARALYN BERT MRN: [id number] Date of Birth: Apr 01, 1946 Today's Date: 10/02/2020 Time: SLP Start Time (ACUTE ONLY): 1400 -SLP Stop Time (ACUTE ONLY): 1415 SLP Time Calculation (min) (ACUTE ONLY): 15 min Past Medical History: Past Medical History: Diagnosis Date . ALLERGIC RHINITIS 06/02/2007 . Allergy  . Arthritis   DDD of cervical, thoracic, and lumbar spine . Blood transfusion without reported diagnosis  . Cataract   Bilateral removed cateracts . Cervical spondylosis   C5-6 and C6-7.  Pain mgmt as per Dr. Nelva Bush. . Chronic gastritis without bleeding  . Chronic low back pain 02/18/2009  s/p fusion L3-4 through L5-S1.  Pain meds per Dr. Nelva Bush . Chronic renal insufficiency, stage 3 (moderate)   borderline II/III (GFR about 55 ml/min) as of 2021 . DEPRESSION 06/02/2007 . Diverticulosis  . GERD (gastroesophageal reflux disease)  . Heart murmur  . Hiatal hernia  . Hypercholesterolemia  . HYPOTHYROIDISM 06/02/2007  GOITER . IBS (irritable bowel syndrome)  . INSOMNIA 06/02/2007 . Lymphocytic colitis  . Muscle spasms of lower extremity  . Nasal septal perforation   chronic; hx of epistaxis . Normocytic anemia 04/2015  HEME + in ED 08/07/15--endoscopic eval unrevealing.  Iron studies fine-->?Anemia of chronic dz (lymphocytic colitis?).  Hb 02/2020 11.5. . OSTEOPENIA 02/18/2009  Repeat DEXA 07/2014 showed osteoporosis by T score in  radius but spine and hip T scores were normal-continue vit D and calcium and repeat DEXA 08/2016 unchanged.  Repeat DEXA 2 yrs. . Peripheral edema   LE's, nonpitting . Renal cyst, acquired, right   1.2 cm, 2021 . Simple hepatic cyst   x 1. 2021 . Trochanteric bursitis of both hips   inj  08/11/20 Past Surgical History: Past Surgical History: Procedure Laterality Date . ABDOMINAL HYSTERECTOMY    for DUB (ovaries are still in) . BACK SURGERY    X 5: fusion of L3-L4 through L5-S1 . CATARACT EXTRACTION Bilateral  . CHOLECYSTECTOMY  2012 . COLONOSCOPY  06/2007; 01/2015  2008 Diverticulosis, o/w normal.  2016 showed lymphocytic colitis with mild diverticulosis: pt was started on oral budesonide at that time. . CT SCAN  09/03/2017  ABDOMEN, HEAD . DEXA  08/16/2014; 09/14/16  Hip and spine ok; radius osteoporotic---but meds not indicated.  Repeat 08/2018 . ESOPHAGOGASTRODUODENOSCOPY  10/2007; 11/27/15; 04/26/20  03/2020: chronic gastritis, H pylori neg, celiac neg.  +Barretts.  Hyperplastic gastric polyp. Marland Kitchen HIP CLOSED REDUCTION Right 09/20/2018  Procedure: CLOSED REDUCTION HIP;  Surgeon: Paralee Cancel, MD;  Location: [facility];  Service: Orthopedics;  Laterality: Right; . REVERSE SHOULDER ARTHROPLASTY Left 06/09/2019  Procedure: REVERSE SHOULDER ARTHROPLASTY;  Surgeon: Netta Cedars, MD;  Location: [facility];  Service: Orthopedics;  Laterality: Left; . SHOULDER SURGERY Right   \ . TOTAL HIP ARTHROPLASTY  05/31/2012  Procedure: TOTAL HIP ARTHROPLASTY ANTERIOR APPROACH;  Surgeon: Mauri Pole, MD;  Location: [facility];  Service: Orthopedics;  Laterality: Left; . TOTAL HIP ARTHROPLASTY Right 08/30/2018  Procedure: RIGHT TOTAL HIP ARTHROPLASTY ANTERIOR APPROACH;  Surgeon: Paralee Cancel, MD;  Location: [facility];  Service: Orthopedics;  Laterality: Right;  70 mins HPI: Patient is a 74 y.o. female with PMHx of HLD, hypothyroidism, GERD, lymphocytic colitis, anxiety/depression, chronic back pain on narcotics-presented with several days history of shortness of breath-found to have acute hypoxic respiratory failure secondary to COVID-19 pneumonia.  Patient started to develop some lethargy 9/24, progressive, with no improvement despite holding Xanax and oxycodone, MRI brain significant for acute CVA 9/26 .desat to 83 on oxygen. Per MRI pt  has Cervical spine degeneration with C3-4 and C4-5 anterolisthesis. 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   Subjective: pt awake in bed Assessment / Plan / Recommendation CHL IP CLINICAL IMPRESSIONS 10/02/2020 Clinical Impression MBS revealed moderate oropharyngeal dysphagia as characterized by delayed oral transit, oral holding, premature spillage, late swallow initiation, and instances of penetration/aspiration. Suspect that pt's decreased respiratory status secondary to Covid-19 is negatively impacting her swallow function. Delayed oral transit and oral holding was observed across all POs. Pt demonstrated premature spillage with a cup sip of thin liquid to the level of the pyriforms, which subsequently entered the airway and an immediate cough was observed. Given a straw sip of thin liquid, premature spillage to the level of the pyriforms occurred and trace flash penetration was observed. Despite oral phase impairments, she demonstrated a functional pharyngeal swallow with nectar-thick liquids and puree. Residue was observed in the valleculae following regular solid. Recommend dys 1 diet and nectar-thick liquids. SLP Visit Diagnosis Dysphagia, oropharyngeal phase (R13.12) Attention and concentration deficit following -- Frontal lobe and executive function deficit following -- Impact on safety and function Moderate aspiration risk   CHL IP TREATMENT RECOMMENDATION 10/02/2020 Treatment Recommendations Therapy as outlined in treatment plan below   Prognosis 10/02/2020 Prognosis for Safe Diet Advancement Fair Barriers to Reach Goals Severity of deficits Barriers/Prognosis Comment -- CHL  IP DIET RECOMMENDATION 10/02/2020 SLP Diet Recommendations Dysphagia 1 (Puree) solids;Nectar thick liquid Liquid Administration via Straw Medication Administration Crushed with puree Compensations Slow rate;Small sips/bites Postural Changes Seated upright at 90 degrees   CHL IP OTHER RECOMMENDATIONS 10/02/2020 Recommended Consults  -- Oral Care Recommendations Oral care QID Other Recommendations Have oral suction available   CHL IP FOLLOW UP RECOMMENDATIONS 10/02/2020 Follow up Recommendations Inpatient Rehab   CHL IP FREQUENCY AND DURATION 10/02/2020 Speech Therapy Frequency (ACUTE ONLY) min 2x/week Treatment Duration 2 weeks      CHL IP ORAL PHASE 10/02/2020 Oral Phase Impaired Oral - Pudding Teaspoon -- Oral - Pudding Cup -- Oral - Honey Teaspoon -- Oral - Honey Cup -- Oral - Nectar Teaspoon -- Oral - Nectar Cup NT Oral - Nectar Straw Premature spillage;Delayed oral transit;Holding of bolus Oral - Thin Teaspoon -- Oral - Thin Cup Premature spillage;Delayed oral transit;Holding of bolus Oral - Thin Straw Premature spillage;Delayed oral transit;Holding of bolus Oral - Puree Holding of bolus;Delayed oral transit Oral - Mech Soft -- Oral - Regular Delayed oral transit;Holding of bolus Oral - Multi-Consistency -- Oral - Pill -- Oral Phase - Comment --  CHL IP PHARYNGEAL PHASE 10/02/2020 Pharyngeal Phase Impaired Pharyngeal- Pudding Teaspoon -- Pharyngeal -- Pharyngeal- Pudding Cup -- Pharyngeal -- Pharyngeal- Honey Teaspoon -- Pharyngeal -- Pharyngeal- Honey Cup -- Pharyngeal -- Pharyngeal- Nectar Teaspoon -- Pharyngeal -- Pharyngeal- Nectar Cup NT Pharyngeal -- Pharyngeal- Nectar Straw Delayed swallow initiation-vallecula;Delayed swallow initiation-pyriform sinuses Pharyngeal -- Pharyngeal- Thin Teaspoon -- Pharyngeal -- Pharyngeal- Thin Cup Delayed swallow initiation-vallecula;Delayed swallow initiation-pyriform sinuses;Penetration/Aspiration before swallow;Moderate aspiration Pharyngeal Material enters airway, passes BELOW cords then ejected out Pharyngeal- Thin Straw Delayed swallow initiation-vallecula;Delayed swallow initiation-pyriform sinuses;Trace aspiration Pharyngeal -- Pharyngeal- Puree WFL Pharyngeal -- Pharyngeal- Mechanical Soft -- Pharyngeal -- Pharyngeal- Regular Delayed swallow initiation-vallecula Pharyngeal -- Pharyngeal-  Multi-consistency -- Pharyngeal -- Pharyngeal- Pill -- Pharyngeal -- Pharyngeal Comment --  CHL IP CERVICAL ESOPHAGEAL PHASE 09/26/2020 Cervical Esophageal Phase WFL Pudding Teaspoon -- Pudding Cup -- Honey Teaspoon -- Honey Cup -- Nectar Teaspoon -- Nectar Cup -- Nectar Straw -- Thin Teaspoon -- Thin Cup -- Thin Straw -- Puree -- Mechanical Soft -- Regular -- Multi-consistency -- Pill -- Cervical Esophageal Comment -- Herbie Baltimore, MA CCC-SLP Acute Rehabilitation Services Pager [phone] Office [phone] Lynann Beaver 10/02/2020, 3:04 PM              DG Swallowing Func-Speech Pathology  Result Date: 09/26/2020 Objective Swallowing Evaluation: Type of Study: MBS-Modified Barium Swallow Study  Patient Details Name: April Brewer MRN: [id number] Date of Birth: 1946-07-10 Today's Date: 09/26/2020 Time: SLP Start Time (ACUTE ONLY): 1412 -SLP Stop Time (ACUTE ONLY): 1429 SLP Time Calculation (min) (ACUTE ONLY): 17 min Past Medical History: Past Medical History: Diagnosis Date . ALLERGIC RHINITIS 06/02/2007 . Allergy  . Arthritis   DDD of cervical, thoracic, and lumbar spine . Blood transfusion without reported diagnosis  . Cataract   Bilateral removed cateracts . Cervical spondylosis   C5-6 and C6-7.  Pain mgmt as per Dr. Nelva Bush. . Chronic gastritis without bleeding  . Chronic low back pain 02/18/2009  s/p fusion L3-4 through L5-S1.  Pain meds per Dr. Nelva Bush . Chronic renal insufficiency, stage 3 (moderate)   borderline II/III (GFR about 55 ml/min) as of 2021 . DEPRESSION 06/02/2007 . Diverticulosis  . GERD (gastroesophageal reflux disease)  . Heart murmur  . Hiatal hernia  . Hypercholesterolemia  . HYPOTHYROIDISM 06/02/2007  GOITER . IBS (irritable bowel syndrome)  .  INSOMNIA 06/02/2007 . Lymphocytic colitis  . Muscle spasms of lower extremity  . Nasal septal perforation   chronic; hx of epistaxis . Normocytic anemia 04/2015  HEME + in ED 08/07/15--endoscopic eval unrevealing.  Iron studies fine-->?Anemia of  chronic dz (lymphocytic colitis?).  Hb 02/2020 11.5. . OSTEOPENIA 02/18/2009  Repeat DEXA 07/2014 showed osteoporosis by T score in radius but spine and hip T scores were normal-continue vit D and calcium and repeat DEXA 08/2016 unchanged.  Repeat DEXA 2 yrs. . Peripheral edema   LE's, nonpitting . Renal cyst, acquired, right   1.2 cm, 2021 . Simple hepatic cyst   x 1. 2021 . Trochanteric bursitis of both hips   inj 08/11/20 Past Surgical History: Past Surgical History: Procedure Laterality Date . ABDOMINAL HYSTERECTOMY    for DUB (ovaries are still in) . BACK SURGERY    X 5: fusion of L3-L4 through L5-S1 . CATARACT EXTRACTION Bilateral  . CHOLECYSTECTOMY  2012 . COLONOSCOPY  06/2007; 01/2015  2008 Diverticulosis, o/w normal.  2016 showed lymphocytic colitis with mild diverticulosis: pt was started on oral budesonide at that time. . CT SCAN  09/03/2017  ABDOMEN, HEAD . DEXA  08/16/2014; 09/14/16  Hip and spine ok; radius osteoporotic---but meds not indicated.  Repeat 08/2018 . ESOPHAGOGASTRODUODENOSCOPY  10/2007; 11/27/15; 04/26/20  03/2020: chronic gastritis, H pylori neg, celiac neg.  +Barretts.  Hyperplastic gastric polyp. Marland Kitchen HIP CLOSED REDUCTION Right 09/20/2018  Procedure: CLOSED REDUCTION HIP;  Surgeon: Paralee Cancel, MD;  Location: [facility];  Service: Orthopedics;  Laterality: Right; . REVERSE SHOULDER ARTHROPLASTY Left 06/09/2019  Procedure: REVERSE SHOULDER ARTHROPLASTY;  Surgeon: Netta Cedars, MD;  Location: [facility];  Service: Orthopedics;  Laterality: Left; . SHOULDER SURGERY Right   \ . TOTAL HIP ARTHROPLASTY  05/31/2012  Procedure: TOTAL HIP ARTHROPLASTY ANTERIOR APPROACH;  Surgeon: Mauri Pole, MD;  Location: [facility];  Service: Orthopedics;  Laterality: Left; . TOTAL HIP ARTHROPLASTY Right 08/30/2018  Procedure: RIGHT TOTAL HIP ARTHROPLASTY ANTERIOR APPROACH;  Surgeon: Paralee Cancel, MD;  Location: [facility];  Service: Orthopedics;  Laterality: Right;  70 mins HPI: Patient is a 74 y.o. female with PMHx of HLD,  hypothyroidism, GERD, lymphocytic colitis, anxiety/depression, chronic back pain on narcotics-presented with several days history of shortness of breath-found to have acute hypoxic respiratory failure secondary to COVID-19 pneumonia.  Patient started to develop some lethargy 9/24, progressive, with no improvement despite holding Xanax and oxycodone, MRI brain significant for acute CVA 9/26 .desat to 83 on oxygen. 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  Swallow eval ordered.  Subjective: pt awake in bed Assessment / Plan / Recommendation CHL IP CLINICAL IMPRESSIONS 09/26/2020 Clinical Impression Severe oropharyngeal dysphagia marked by oral holding, lingual residue, inefficient posterior propulsion, late swallow initiation, decreased pharyngeal contraction. Initially able to propel nectar and first trial of puree but subsequent attempts were unsuccessful. Pt held head in an extended postion (diagnosed with cervical spinal degeneration with C3-4 and C4-5 anterolisthesis; dry spoon presentations and verbal cues were not effective. Barium reached her pyriform sinuses for several minutes and was eventually suctioned. Swallow initiated late therefore nectar thick barium dumped to her small pyriform sinus cavities and immediately fell into trachea with immediate cough. Residue in valleculae was max with puree. Recommend continued NPO and ST in attempts to return to po's. Continue oral care.     SLP Visit Diagnosis Dysphagia, oropharyngeal phase (R13.12) Attention and concentration deficit following --  Frontal lobe and executive function deficit following -- Impact on safety and function Severe aspiration risk   CHL IP TREATMENT RECOMMENDATION 09/26/2020 Treatment Recommendations Therapy as outlined in treatment plan below   Prognosis 09/26/2020 Prognosis for Safe Diet Advancement Fair Barriers to Reach Goals Severity of deficits  Barriers/Prognosis Comment -- CHL IP DIET RECOMMENDATION 09/26/2020 SLP Diet Recommendations NPO Liquid Administration via -- Medication Administration Via alternative means Compensations -- Postural Changes --   CHL IP OTHER RECOMMENDATIONS 09/26/2020 Recommended Consults -- Oral Care Recommendations Oral care QID Other Recommendations --   CHL IP FOLLOW UP RECOMMENDATIONS 09/26/2020 Follow up Recommendations Skilled Nursing facility   [facility] IP FREQUENCY AND DURATION 09/26/2020 Speech Therapy Frequency (ACUTE ONLY) min 2x/week Treatment Duration 2 weeks      CHL IP ORAL PHASE 09/26/2020 Oral Phase Impaired Oral - Pudding Teaspoon -- Oral - Pudding Cup -- Oral - Honey Teaspoon -- Oral - Honey Cup -- Oral - Nectar Teaspoon -- Oral - Nectar Cup Delayed oral transit;Decreased bolus cohesion;Holding of bolus;Reduced posterior propulsion;Lingual/palatal residue Oral - Nectar Straw -- Oral - Thin Teaspoon -- Oral - Thin Cup -- Oral - Thin Straw -- Oral - Puree Holding of bolus;Delayed oral transit;Weak lingual manipulation;Reduced posterior propulsion;Lingual/palatal residue Oral - Mech Soft -- Oral - Regular -- Oral - Multi-Consistency -- Oral - Pill -- Oral Phase - Comment --  CHL IP PHARYNGEAL PHASE 09/26/2020 Pharyngeal Phase Impaired Pharyngeal- Pudding Teaspoon -- Pharyngeal -- Pharyngeal- Pudding Cup -- Pharyngeal -- Pharyngeal- Honey Teaspoon -- Pharyngeal -- Pharyngeal- Honey Cup -- Pharyngeal -- Pharyngeal- Nectar Teaspoon -- Pharyngeal -- Pharyngeal- Nectar Cup Delayed swallow initiation-pyriform sinuses;Pharyngeal residue - valleculae;Pharyngeal residue - pyriform Pharyngeal -- Pharyngeal- Nectar Straw -- Pharyngeal -- Pharyngeal- Thin Teaspoon -- Pharyngeal -- Pharyngeal- Thin Cup -- Pharyngeal -- Pharyngeal- Thin Straw -- Pharyngeal -- Pharyngeal- Puree Delayed swallow initiation-vallecula;Reduced pharyngeal peristalsis;Pharyngeal residue - pyriform;Reduced tongue base retraction Pharyngeal -- Pharyngeal- Mechanical  Soft -- Pharyngeal -- Pharyngeal- Regular -- Pharyngeal -- Pharyngeal- Multi-consistency -- Pharyngeal -- Pharyngeal- Pill -- Pharyngeal -- Pharyngeal Comment --  CHL IP CERVICAL ESOPHAGEAL PHASE 09/26/2020 Cervical Esophageal Phase WFL Pudding Teaspoon -- Pudding Cup -- Honey Teaspoon -- Honey Cup -- Nectar Teaspoon -- Nectar Cup -- Nectar Straw -- Thin Teaspoon -- Thin Cup -- Thin Straw -- Puree -- Mechanical Soft -- Regular -- Multi-consistency -- Pill -- Cervical Esophageal Comment -- Houston Siren 09/26/2020, 4:48 PM Orbie Pyo Colvin Caroli.Ed Actor Pager [phone] Office [phone]              ECHOCARDIOGRAM COMPLETE  Result Date: 09/23/2020    ECHOCARDIOGRAM REPORT   Patient Name:   April Brewer Date of Exam: 09/23/2020 Medical Rec #:  [id number]       Height:       64.0 in Accession #:    [id number]      Weight:       145.9 lb Date of Birth:  Mar 08, 1946       BSA:          21.711 m Patient Age:    43 years        BP:           139/82 mmHg Patient Gender: F               HR:           84 bpm. Exam Location:  Inpatient Procedure: 2D Echo, Cardiac Doppler and Color Doppler Indications:    Stroke  434.91 / I163.9  History:        Patient has no prior history of Echocardiogram examinations.                 Signs/Symptoms:Murmur; Risk Factors:Dyslipidemia. COVID-19                 Positive. CKD. GERD. Hypothyroidism.  Sonographer:    Jonelle Sidle Dance Referring Phys: [id number]  1. Left ventricular ejection fraction, by estimation, is 65 to 70%. The left ventricle has normal function. The left ventricle has no regional wall motion abnormalities. There is mild concentric left ventricular hypertrophy. Left ventricular diastolic parameters are consistent with Grade I diastolic dysfunction (impaired relaxation).  2. Right ventricular systolic function is normal. The right ventricular size is normal.  3. Left atrial size was mildly dilated.  4. The mitral valve is  normal in structure. Mild mitral valve regurgitation. No evidence of mitral stenosis. Moderate mitral annular calcification.  5. The aortic valve is normal in structure. Aortic valve regurgitation is not visualized. No aortic stenosis is present.  6. The inferior vena cava is normal in size with greater than 50% respiratory variability, suggesting right atrial pressure of 3 mmHg. Comparison(s): No prior Echocardiogram. Conclusion(s)/Recommendation(s): No intracardiac source of embolism detected on this transthoracic study. A transesophageal echocardiogram is recommended to exclude cardiac source of embolism if clinically indicated. FINDINGS  Left Ventricle: Left ventricular ejection fraction, by estimation, is 65 to 70%. The left ventricle has normal function. The left ventricle has no regional wall motion abnormalities. The left ventricular internal cavity size was normal in size. There is  mild concentric left ventricular hypertrophy. Left ventricular diastolic parameters are consistent with Grade I diastolic dysfunction (impaired relaxation). Normal left ventricular filling pressure. Right Ventricle: The right ventricular size is normal. No increase in right ventricular wall thickness. Right ventricular systolic function is normal. Left Atrium: Left atrial size was mildly dilated. Right Atrium: Right atrial size was normal in size. Pericardium: There is no evidence of pericardial effusion. Mitral Valve: The mitral valve is normal in structure. There is mild thickening of the mitral valve leaflet(s). There is mild calcification of the mitral valve leaflet(s). Moderate mitral annular calcification. Mild mitral valve regurgitation. No evidence of mitral valve stenosis. Tricuspid Valve: The tricuspid valve is normal in structure. Tricuspid valve regurgitation is mild . No evidence of tricuspid stenosis. Aortic Valve: The aortic valve is normal in structure. Aortic valve regurgitation is not visualized. No aortic  stenosis is present. Pulmonic Valve: The pulmonic valve was normal in structure. Pulmonic valve regurgitation is not visualized. No evidence of pulmonic stenosis. Aorta: The aortic root is normal in size and structure. Venous: The inferior vena cava is normal in size with greater than 50% respiratory variability, suggesting right atrial pressure of 3 mmHg. IAS/Shunts: No atrial level shunt detected by color flow Doppler.  LEFT VENTRICLE PLAX 2D LVIDd:         3.80 cm  Diastology LVIDs:         2.40 cm  LV e' lateral:   9.46 cm/s LV PW:         1.10 cm  LV E/e' lateral: 8.7 LV IVS:        1.10 cm LVOT diam:     2.10 cm LV SV:         105 LV SV Index:   61 LVOT Area:     3.46 cm  RIGHT VENTRICLE  IVC RV Basal diam:  2.00 cm     IVC diam: 1.20 cm RV S prime:     13.90 cm/s TAPSE (M-mode): 1.5 cm LEFT ATRIUM             Index       RIGHT ATRIUM          Index LA diam:        4.00 cm 2.34 cm/m  RA Area:     8.10 cm LA Vol (A2C):   48.1 ml 28.11 ml/m RA Volume:   13.00 ml 7.60 ml/m LA Vol (A4C):   34.5 ml 20.16 ml/m LA Biplane Vol: 42.3 ml 24.72 ml/m  AORTIC VALVE LVOT Vmax:   148.00 cm/s LVOT Vmean:  98.300 cm/s LVOT VTI:    0.302 m  AORTA Ao Root diam: 3.20 cm Ao Asc diam:  3.10 cm MITRAL VALVE MV Area (PHT): 2.53 cm     SHUNTS MV Decel Time: 300 msec     Systemic VTI:  0.30 m MV E velocity: 81.90 cm/s   Systemic Diam: 2.10 cm MV A velocity: 133.00 cm/s MV E/A ratio:  0.62 Ena Dawley MD Electronically signed by Ena Dawley MD Signature Date/Time: 09/23/2020/5:19:20 PM    Final

## 2020-10-03 NOTE — Plan of Care (Signed)
Patient able to swallow PO meds for me crushed and in applesauce with a little thickener. Patient's HOB was elevated for 30 mins after PO intake. No signs or symptoms of aspiration noted. Patient is still on continuous feeds of Osmolite 1.5 via Dobhoff tube at goal of 55 ml/hr. No s/sx of pain or discomfort noted. Bed alarm on and in place per fall safety precautions. Will continue to monitor and continue current POC.

## 2020-10-04 ENCOUNTER — Inpatient Hospital Stay (HOSPITAL_COMMUNITY): Payer: Medicare HMO

## 2020-10-04 DIAGNOSIS — U071 COVID-19: Secondary | ICD-10-CM | POA: Diagnosis not present

## 2020-10-04 DIAGNOSIS — J9601 Acute respiratory failure with hypoxia: Secondary | ICD-10-CM | POA: Diagnosis not present

## 2020-10-04 DIAGNOSIS — R1312 Dysphagia, oropharyngeal phase: Secondary | ICD-10-CM | POA: Diagnosis not present

## 2020-10-04 DIAGNOSIS — I6330 Cerebral infarction due to thrombosis of unspecified cerebral artery: Secondary | ICD-10-CM | POA: Diagnosis not present

## 2020-10-04 LAB — CBC
HCT: 37.6 % (ref 36.0–46.0)
Hemoglobin: 12.2 g/dL (ref 12.0–15.0)
MCH: 29.8 pg (ref 26.0–34.0)
MCHC: 32.4 g/dL (ref 30.0–36.0)
MCV: 91.7 fL (ref 80.0–100.0)
Platelets: 248 10*3/uL (ref 150–400)
RBC: 4.1 MIL/uL (ref 3.87–5.11)
RDW: 15.1 % (ref 11.5–15.5)
WBC: 14 10*3/uL — ABNORMAL HIGH (ref 4.0–10.5)
nRBC: 0 % (ref 0.0–0.2)

## 2020-10-04 LAB — BASIC METABOLIC PANEL
Anion gap: 12 (ref 5–15)
BUN: 15 mg/dL (ref 8–23)
CO2: 24 mmol/L (ref 22–32)
Calcium: 9.2 mg/dL (ref 8.9–10.3)
Chloride: 97 mmol/L — ABNORMAL LOW (ref 98–111)
Creatinine, Ser: 0.75 mg/dL (ref 0.44–1.00)
GFR calc non Af Amer: >60 mL/min (ref >60)
Glucose, Bld: 119 mg/dL — ABNORMAL HIGH (ref 70–99)
Potassium: 4.9 mmol/L (ref 3.5–5.1)
Sodium: 133 mmol/L — ABNORMAL LOW (ref 135–145)

## 2020-10-04 LAB — GLUCOSE, CAPILLARY
Glucose-Capillary: 153 mg/dL — ABNORMAL HIGH (ref 70–99)
Glucose-Capillary: 140 mg/dL — ABNORMAL HIGH (ref 70–99)
Glucose-Capillary: 145 mg/dL — ABNORMAL HIGH (ref 70–99)
Glucose-Capillary: 136 mg/dL — ABNORMAL HIGH (ref 70–99)
Glucose-Capillary: 145 mg/dL — ABNORMAL HIGH (ref 70–99)

## 2020-10-04 MED ORDER — OSMOLITE 1.5 CAL PO LIQD
1000.0000 mL | ORAL | Status: DC
  Administered 2020-10-04 – 2020-10-05 (×2): 1000 mL
  Filled 2020-10-04 (×2): qty 1000

## 2020-10-04 MED ORDER — ENSURE ENLIVE PO LIQD
237.0000 mL | Freq: Three times a day (TID) | ORAL | Status: DC
  Administered 2020-10-04: 237 mL via ORAL

## 2020-10-04 NOTE — Progress Notes (Signed)
PROGRESS NOTE                                                                                                                                                                                                             Patient Demographics:    April Brewer, is a 74 y.o. female, DOB - 09-21-1946, QJF:[id number]  Outpatient Primary MD for the patient is McGowen, Adrian Blackwater, MD   Admit date - 09/20/2020   LOS - 48  Chief Complaint  Patient presents with  . Shortness of Breath       Brief Narrative: Patient is a 74 y.o. female with PMHx of HLD, hypothyroidism, GERD, lymphocytic colitis, anxiety/depression, chronic back pain on narcotics-presented with several days history of shortness of breath-found to have acute hypoxic respiratory failure secondary to COVID-19 pneumonia.  Patient was admitted to the hospitalist service-started on COVID-19 treatment-unfortunately further hospital course complicated by acute CVA.  See below for further details.    COVID-19 vaccinated status: Unvaccinated  Significant Events: 9/18>> Admit to [facility] for severe hypoxia due to COVID-19 pneumonia 9/26>> worsening lethargy-MRI brain +ve for acute CVA  Significant studies: 9/17>>Chest x-ray: Diffuse interstitial and groundglass opacities 9/26>> MRI brain: Multifocal acute infarcts in the posterior circulation. 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  She has been just picking on her plate.  Denies any complaints.  Denies any shortness of breath this morning.  No nausea vomiting.  No abdominal pain.     Assessment  & Plan :   Acute Hypoxic Resp Failure due to Covid 19 Viral pneumonia Had severe hypoxemia on initial presentation requiring 15 L of HFNC. Patient has completed course of Remdesivir. Remains on steroids which is being tapered down. Patient was also on baricitinib which had to be held due to dysphagia from acute stroke. D-dimer  improved to 0.81. CRP improved as well. Patient is respiratory status remains stable.  She is requiring 3 L of oxygen saturating in the low 90s.  Continue with incentive spirometry and mobilization.   She can come off of isolation today.  Acute ischemic CVA Felt to be due to hypercoagulable state in the setting of COVID-19. Telemetry without any arrhythmias. Echo without any obvious embolic source.   Followed by PT OT and SLP.  Cortrak feeding tube was placed and tube feedings were initiated.  She will need to be on aspirin and Plavix for 3 months followed by aspirin alone.    Oropharyngeal dysphagia Did not do well with the modified barium swallow on 9/30.  Cortrak tube was placed. Tube feedings initiated.   Speech therapy continues to follow.  Modified barium swallow repeated on 10/6.  Patient was placed on dysphagia 1 diet.  However per nursing reports that while intake is very inadequate.  Patient will likely need to be on tube feedings for the foreseeable future until her dysphagia improves.  This might take several weeks.  It will be better if she has a PEG tube rather than nasogastric tube.  She also needs to go to SNF for rehab and they do not take patients with nasogastric tubes.   Patient is agreeable.  Will discuss with son.  Will consult interventional radiology.  PSVT Noted on tele. Patient asymptomatic. Recent echo reviewed.  TSH was normal, 1.86 on 8/30.  Patient was started on metoprolol.  No further episodes noted.  Monitor electrolytes periodically.     Sodium abnormalities Initially noted to be hypernatremic due to lack of free water.  After NG tube was placed she was started on free water with a significant drop in sodium levels.  Free water frequency was decreased.  D5 was discontinued.  Sodium is low but stable.    Hypokalemia Improved.  Transaminitis Improved-secondary to COVID-19.  HLD LDL noted to be 72.  Continue statin.  Hypothyroidism Continue Synthroid.  Anxiety/depression Seems to be stable.   Noted to be on lamotrigine and Zoloft  Chronic back pain Patient on chronic narcotics at home.  Goals of care:  Done by previous rounding physician. Currently DNR.   GI prophylaxis:PPI  Consults: Neurology  Family Communication  :  Son (Thomas [phone]).  Son being updated daily.    Code Status :   DNR  Diet :  Diet Order            DIET - DYS 1 Room service appropriate? Yes; Fluid consistency: Nectar Thick  Diet effective now                  Disposition Plan  : Physical therapy has changed the recommendation from [facility] to SNF as they do not think that the patient will be able to tolerate the intensive therapy at [facility].  Social worker is following.   We will need to address the NG tube before she can be discharged.  Interventional radiology consulted today for PEG tube placement.  COVID-19 positive test was on 9/17.  Will complete 3 weeks on October 7.  Should be able to discontinue airborne and contact isolation effective October 8.  Status is: Inpatient  Remains inpatient appropriate because:Inpatient level of care appropriate due to severity of illness   Dispo: The patient is from: Home              Anticipated d/c is to: SNF versus [facility].  Anticipated d/c date is: > 3 days              Patient currently is not medically stable to d/c.    Antimicorbials  :     Anti-infectives (From admission, onward)   Start     Dose/Rate Route Frequency Ordered Stop   09/14/20 2200  cefTRIAXone (ROCEPHIN) 2 g in sodium chloride 0.9 % 100 mL IVPB  Status:  Discontinued        2 g 200 mL/hr over 30 Minutes Intravenous Every 24 hours 09/14/20 2043 09/15/20 1102   09/14/20 2200  azithromycin (ZITHROMAX) 500 mg in sodium chloride 0.9 % 250 mL IVPB  Status:  Discontinued        500 mg 250 mL/hr over 60 Minutes Intravenous Every 24 hours 09/14/20 2043 09/15/20 1102   09/14/20 1000  remdesivir 100 mg in sodium chloride 0.9 % 100 mL IVPB       "Followed by" Linked Group Details   100 mg 200 mL/hr over 30 Minutes Intravenous Daily 09/24/2020 2306 09/17/20 0907   09/15/2020 2330  remdesivir 100 mg in sodium chloride 0.9 % 100 mL IVPB       "Followed by" Linked Group Details   100 mg 200 mL/hr over 30 Minutes Intravenous Every 30 min 09/01/2020 2306 09/14/20 0033      Inpatient Medications  Scheduled Meds: . acidophilus  1 capsule Oral Daily  . albuterol  2 puff Inhalation Q6H  . aspirin  325 mg Per Tube Daily  . atorvastatin  10 mg Per Tube Daily  . clopidogrel  75 mg Per Tube Daily  . enoxaparin (LOVENOX) injection  40 mg Subcutaneous Q24H  . famotidine  20 mg Per Tube Daily  . feeding supplement (PROSource TF)  45 mL Per Tube BID  . free water  100 mL Per Tube Q6H  . influenza vaccine adjuvanted  0.5 mL Intramuscular Tomorrow-1000  . lamoTRIgine  100 mg Per Tube Daily  . levothyroxine  100 mcg Per Tube Q0600  . mouth rinse  15 mL Mouth Rinse BID  . metoprolol tartrate  25 mg Per Tube BID  . sertraline  100 mg Oral BID  . sodium chloride flush  10-40 mL Intracatheter Q12H  . sodium chloride flush  3 mL Intravenous Q12H   Continuous Infusions: . sodium chloride Stopped (09/15/20 0004)  . sodium chloride Stopped (09/14/20 2233)  . feeding supplement (OSMOLITE 1.5 CAL) 1,000 mL (10/03/20 2212)   PRN Meds:.sodium chloride, sodium chloride, acetaminophen,  chlorpheniramine-HYDROcodone, guaiFENesin-dextromethorphan, LORazepam, morphine injection, ondansetron **OR** ondansetron (ZOFRAN) IV, phenol, Resource ThickenUp Clear, sodium chloride flush    Bonnielee Haff M.D on 10/04/2020 at 11:11 AM  To page go to [email or web address] - use universal password  [facility] -  Office  [phone]    Objective:   Vitals:   10/03/20 2036 10/04/20 0500 10/04/20 0513 10/04/20 0802  BP: 128/81  117/74 118/69  Pulse: 96  92 90  Resp: 16  16 (!) 22  Temp: 98.1 F (36.7 C)  98.2 F (36.8 C) 98.3 F (36.8 C)  TempSrc: Oral  Oral Oral  SpO2: (!) 88%  92% 95%  Weight:  61.1 kg    Height:        Wt Readings from Last 3 Encounters:  10/04/20 61.1 kg  09/22/20 66.2 kg  09/04/20 69.2 kg     Intake/Output Summary (Last 24 hours) at 10/04/2020 1111 Last data filed at 10/04/2020 0700 Gross per 24 hour  Intake --  Output 0 ml  Net 0 ml     Physical Exam  General appearance: Awake alert.  In no distress Resp: Improved effort.  Coarse breath sound bilaterally with few crackles in the bases.  No wheezing or rhonchi.   Cardio: S1-S2 is normal regular.  No S3-S4.  No rubs murmurs or bruit GI: Abdomen is soft.  Nontender nondistended.  Bowel sounds are present normal.  No masses organomegaly Extremities: No edema.   Neurologic: Moving all extremities      Data Review:    CBC Recent Labs  Lab 10/04/20 0541  WBC 14.0*  HGB 12.2  HCT 37.6  PLT 248  MCV 91.7  MCH 29.8  MCHC 32.4  RDW 15.1    Chemistries  Recent Labs  Lab 09/28/20 0500 09/29/20 0500 09/30/20 0500 10/01/20 0448 10/02/20 0440 10/03/20 0246 10/04/20 0541  NA 140   < > 131* 132* 133* 131* 133*  K 3.6   < > 4.1 4.5 4.6 4.5 4.9  CL 108   < > 98 99 99 97* 97*  CO2 23   < > 30 23 24 24 24   GLUCOSE 153*   < > 136* 111* 98 127* 119*  BUN 17   < > 15 15 16 19 15   CREATININE 0.80   < > 0.70 0.67 0.67 0.63 0.75  CALCIUM 8.5*   < > 8.1* 8.5* 8.8* 8.7* 9.2  MG 2.3  --   1.7 2.0  --   --   --    < > = values in this interval not displayed.    Micro Results No results found for this or any previous visit (from the past 240 hour(s)).  Radiology Reports CT ANGIO HEAD W OR WO CONTRAST  Result Date: 09/24/2020 CLINICAL DATA:  Posterior circulation stroke.  COVID positive. EXAM: CT ANGIOGRAPHY HEAD AND NECK TECHNIQUE: Multidetector CT imaging of the head and neck was performed using the standard protocol during bolus administration of intravenous contrast. Multiplanar CT image reconstructions and MIPs were obtained to evaluate the vascular anatomy. Carotid stenosis measurements (when applicable) are obtained utilizing NASCET criteria, using the distal internal carotid diameter as the denominator. CONTRAST:  46mL OMNIPAQUE IOHEXOL 350 MG/ML SOLN COMPARISON:  MRI 09/22/2020 FINDINGS: CTA NECK FINDINGS Aortic arch: Visualized portions demonstrate atherosclerosis without significant stenosis or aneurysm. Right carotid system: No evidence of dissection, stenosis (50% or greater) or occlusion. Left carotid system: No evidence of dissection, stenosis (50% or greater) or occlusion. Vertebral arteries: Right dominant. There is abrupt occlusion of the left vertebral artery at the level of C2. Reconstitution immediately before the vertebral artery courses intradural E, which may be from retrograde flow and/or collaterals. Skeleton: Multilevel severe degenerative disc disease in the cervical spine. Other neck: No mass lesion or adenopathy. Upper chest: Peripheral predominant ground-glass opacities in the imaged lung apices, compatible with COVID pneumonia. Review of the MIP images confirms the above findings CTA HEAD FINDINGS Anterior circulation: No significant stenosis, proximal occlusion, aneurysm, or vascular malformation. Posterior circulation: Abrupt inclusion of the distal right P2 PCA (series 5, image 448). a Venous sinuses: As permitted by contrast timing, patent. Additional  comments: Partially imaged edema associated with multifocal posterior circulation infarcts that were better characterized on recent MRI. Review of the MIP images confirms the above findings IMPRESSION: 1. 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 2. Partially imaged edema associated with multifocal posterior circulation infarcts that  were better characterized on recent MRI. Non-contrast head CT could further evaluate the degree of edema/mass effect if clinically indicated. 3. Peripheral predominant ground-glass opacities in the imaged lung apices, compatible with COVID pneumonia given the clinical history. These results will be called to the ordering clinician or representative by the Radiologist Assistant, and communication documented in the PACS or [employer]. Electronically Signed   By: Margaretha Sheffield MD   On: 09/24/2020 09:45   CT ANGIO NECK W OR WO CONTRAST  Result Date: 09/24/2020 CLINICAL DATA:  Posterior circulation stroke.  COVID positive. EXAM: CT ANGIOGRAPHY HEAD AND NECK TECHNIQUE: Multidetector CT imaging of the head and neck was performed using the standard protocol during bolus administration of intravenous contrast. Multiplanar CT image reconstructions and MIPs were obtained to evaluate the vascular anatomy. Carotid stenosis measurements (when applicable) are obtained utilizing NASCET criteria, using the distal internal carotid diameter as the denominator. CONTRAST:  17mL OMNIPAQUE IOHEXOL 350 MG/ML SOLN COMPARISON:  MRI 09/22/2020 FINDINGS: CTA NECK FINDINGS Aortic arch: Visualized portions demonstrate atherosclerosis without significant stenosis or aneurysm. Right carotid system: No evidence of dissection, stenosis (50% or greater) or occlusion. Left carotid system: No evidence of dissection, stenosis (50% or greater) or occlusion. Vertebral arteries: Right dominant. There is abrupt occlusion of the left vertebral artery at  the level of C2. Reconstitution immediately before the vertebral artery courses intradural E, which may be from retrograde flow and/or collaterals. Skeleton: Multilevel severe degenerative disc disease in the cervical spine. Other neck: No mass lesion or adenopathy. Upper chest: Peripheral predominant ground-glass opacities in the imaged lung apices, compatible with COVID pneumonia. Review of the MIP images confirms the above findings CTA HEAD FINDINGS Anterior circulation: No significant stenosis, proximal occlusion, aneurysm, or vascular malformation. Posterior circulation: Abrupt inclusion of the distal right P2 PCA (series 5, image 448). a Venous sinuses: As permitted by contrast timing, patent. Additional comments: Partially imaged edema associated with multifocal posterior circulation infarcts that were better characterized on recent MRI. Review of the MIP images confirms the above findings IMPRESSION: 1. 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 2. Partially imaged edema associated with multifocal posterior circulation infarcts that were better characterized on recent MRI. Non-contrast head CT could further evaluate the degree of edema/mass effect if clinically indicated. 3. Peripheral predominant ground-glass opacities in the imaged lung apices, compatible with COVID pneumonia given the clinical history. These results will be called to the ordering clinician or representative by the Radiologist Assistant, and communication documented in the PACS or [employer]. Electronically Signed   By: Margaretha Sheffield MD   On: 09/24/2020 09:45   MR BRAIN WO CONTRAST  Result Date: 09/22/2020 CLINICAL DATA:  Delirium.  COVID positive EXAM: MRI HEAD WITHOUT CONTRAST TECHNIQUE: Multiplanar, multiecho pulse sequences of the brain and surrounding structures were obtained without intravenous contrast. COMPARISON:  None. FINDINGS: Brain: Extensive acute  infarction in the posterior circulation, patchily present throughout the left more than right cerebellum with right cerebellar involvement mainly at the superior cerebellar distribution and left-sided involvement both at the PICA and superior cerebellar arteries. Right more than left PCA involvement involving the bilateral thalamus and right more than left occipital cortex. Right more than left hippocampal region involvement. No anterior circulation infarct is seen. No hemorrhage, hydrocephalus, or masslike finding. Mild chronic small vessel disease. Cerebral volume loss. Vascular: A right vertebral and basilar flow void or visible. The left vertebral artery could  be hypoplastic or diseased. Skull and upper cervical spine: Normal marrow signal. Cervical spine degeneration with C3-4 and C4-5 anterolisthesis Sinuses/Orbits: Bilateral cataract resection. Nasal septal perforation. Other: Progressively motion degraded study ASAP these results will be called to the ordering clinician or representative by the Radiologist Assistant, and communication documented in the PACS or [employer]. 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 Electronically Signed   By: Monte Fantasia M.D.   On: 09/22/2020 11:45   DG Chest Portable 1 View  Result Date: 09/07/2020 CLINICAL DATA:  Cough, congestion, fever, loss of taste and smell for 1 week EXAM: PORTABLE CHEST 1 VIEW COMPARISON:  01/11/2014 FINDINGS: Single frontal view of the chest demonstrates stable enlarged cardiac silhouette. There is diffuse increased interstitial prominence with bilateral ground-glass consolidation. No effusion or pneumothorax. No acute bony abnormalities. Bilateral shoulder arthroplasties. IMPRESSION: 1. Diffuse interstitial and ground-glass opacities, consistent with bilateral atypical pneumonia given clinical history. Electronically Signed   By: Randa Ngo M.D.   On: 09/10/2020 23:33   DG Swallowing  Func-Speech Pathology  Result Date: 10/02/2020 Completed and documented by Greggory Keen, SLP student Supervised and reviewed by Herbie Baltimore MA CCC-SLP Objective Swallowing Evaluation: Type of Study: MBS-Modified Barium Swallow Study  Patient Details Name: ROZELLA SERVELLO MRN: [id number] Date of Birth: [birth year] Today's Date: 10/02/2020 Time: SLP Start Time (ACUTE ONLY): 1400 -SLP Stop Time (ACUTE ONLY): 1415 SLP Time Calculation (min) (ACUTE ONLY): 15 min Past Medical History: Past Medical History: Diagnosis Date . ALLERGIC RHINITIS 06/02/2007 . Allergy  . Arthritis   DDD of cervical, thoracic, and lumbar spine . Blood transfusion without reported diagnosis  . Cataract   Bilateral removed cateracts . Cervical spondylosis   C5-6 and C6-7.  Pain mgmt as per Dr. Nelva Bush. . Chronic gastritis without bleeding  . Chronic low back pain 02/18/2009  s/p fusion L3-4 through L5-S1.  Pain meds per Dr. Nelva Bush . Chronic renal insufficiency, stage 3 (moderate)   borderline II/III (GFR about 55 ml/min) as of 2021 . DEPRESSION 06/02/2007 . Diverticulosis  . GERD (gastroesophageal reflux disease)  . Heart murmur  . Hiatal hernia  . Hypercholesterolemia  . HYPOTHYROIDISM 06/02/2007  GOITER . IBS (irritable bowel syndrome)  . INSOMNIA 06/02/2007 . Lymphocytic colitis  . Muscle spasms of lower extremity  . Nasal septal perforation   chronic; hx of epistaxis . Normocytic anemia 04/2015  HEME + in ED 08/07/15--endoscopic eval unrevealing.  Iron studies fine-->?Anemia of chronic dz (lymphocytic colitis?).  Hb 02/2020 11.5. . OSTEOPENIA 02/18/2009  Repeat DEXA 07/2014 showed osteoporosis by T score in radius but spine and hip T scores were normal-continue vit D and calcium and repeat DEXA 08/2016 unchanged.  Repeat DEXA 2 yrs. . Peripheral edema   LE's, nonpitting . Renal cyst, acquired, right   1.2 cm, 2021 . Simple hepatic cyst   x 1. 2021 . Trochanteric bursitis of both hips   inj 08/11/20 Past Surgical History: Past Surgical History: Procedure  Laterality Date . ABDOMINAL HYSTERECTOMY    for DUB (ovaries are still in) . BACK SURGERY    X 5: fusion of L3-L4 through L5-S1 . CATARACT EXTRACTION Bilateral  . CHOLECYSTECTOMY  2012 . COLONOSCOPY  06/2007; 01/2015  2008 Diverticulosis, o/w normal.  2016 showed lymphocytic colitis with mild diverticulosis: pt was started on oral budesonide at that time. . CT SCAN  09/03/2017  ABDOMEN, HEAD . DEXA  08/16/2014; 09/14/16  Hip and spine ok; radius osteoporotic---but meds not indicated.  Repeat 08/2018 .  ESOPHAGOGASTRODUODENOSCOPY  10/2007; 11/27/15; 04/26/20  03/2020: chronic gastritis, H pylori neg, celiac neg.  +Barretts.  Hyperplastic gastric polyp. Marland Kitchen HIP CLOSED REDUCTION Right 09/20/2018  Procedure: CLOSED REDUCTION HIP;  Surgeon: Paralee Cancel, MD;  Location: [facility];  Service: Orthopedics;  Laterality: Right; . REVERSE SHOULDER ARTHROPLASTY Left 06/09/2019  Procedure: REVERSE SHOULDER ARTHROPLASTY;  Surgeon: Netta Cedars, MD;  Location: [facility];  Service: Orthopedics;  Laterality: Left; . SHOULDER SURGERY Right   \ . TOTAL HIP ARTHROPLASTY  05/31/2012  Procedure: TOTAL HIP ARTHROPLASTY ANTERIOR APPROACH;  Surgeon: Mauri Pole, MD;  Location: [facility];  Service: Orthopedics;  Laterality: Left; . TOTAL HIP ARTHROPLASTY Right 08/30/2018  Procedure: RIGHT TOTAL HIP ARTHROPLASTY ANTERIOR APPROACH;  Surgeon: Paralee Cancel, MD;  Location: [facility];  Service: Orthopedics;  Laterality: Right;  70 mins HPI: Patient is a 74 y.o. female with PMHx of HLD, hypothyroidism, GERD, lymphocytic colitis, anxiety/depression, chronic back pain on narcotics-presented with several days history of shortness of breath-found to have acute hypoxic respiratory failure secondary to COVID-19 pneumonia.  Patient started to develop some lethargy 9/24, progressive, with no improvement despite holding Xanax and oxycodone, MRI brain significant for acute CVA 9/26 .desat to 83 on oxygen. Per MRI pt has Cervical spine degeneration with C3-4 and C4-5  anterolisthesis. 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   Subjective: pt awake in bed Assessment / Plan / Recommendation CHL IP CLINICAL IMPRESSIONS 10/02/2020 Clinical Impression MBS revealed moderate oropharyngeal dysphagia as characterized by delayed oral transit, oral holding, premature spillage, late swallow initiation, and instances of penetration/aspiration. Suspect that pt's decreased respiratory status secondary to Covid-19 is negatively impacting her swallow function. Delayed oral transit and oral holding was observed across all POs. Pt demonstrated premature spillage with a cup sip of thin liquid to the level of the pyriforms, which subsequently entered the airway and an immediate cough was observed. Given a straw sip of thin liquid, premature spillage to the level of the pyriforms occurred and trace flash penetration was observed. Despite oral phase impairments, she demonstrated a functional pharyngeal swallow with nectar-thick liquids and puree. Residue was observed in the valleculae following regular solid. Recommend dys 1 diet and nectar-thick liquids. SLP Visit Diagnosis Dysphagia, oropharyngeal phase (R13.12) Attention and concentration deficit following -- Frontal lobe and executive function deficit following -- Impact on safety and function Moderate aspiration risk   CHL IP TREATMENT RECOMMENDATION 10/02/2020 Treatment Recommendations Therapy as outlined in treatment plan below   Prognosis 10/02/2020 Prognosis for Safe Diet Advancement Fair Barriers to Reach Goals Severity of deficits Barriers/Prognosis Comment -- CHL IP DIET RECOMMENDATION 10/02/2020 SLP Diet Recommendations Dysphagia 1 (Puree) solids;Nectar thick liquid Liquid Administration via Straw Medication Administration Crushed with puree Compensations Slow rate;Small sips/bites Postural Changes Seated upright at 90 degrees   CHL IP OTHER RECOMMENDATIONS 10/02/2020 Recommended Consults -- Oral Care Recommendations Oral care QID Other  Recommendations Have oral suction available   CHL IP FOLLOW UP RECOMMENDATIONS 10/02/2020 Follow up Recommendations Inpatient Rehab   CHL IP FREQUENCY AND DURATION 10/02/2020 Speech Therapy Frequency (ACUTE ONLY) min 2x/week Treatment Duration 2 weeks      CHL IP ORAL PHASE 10/02/2020 Oral Phase Impaired Oral - Pudding Teaspoon -- Oral - Pudding Cup -- Oral - Honey Teaspoon -- Oral - Honey Cup -- Oral - Nectar Teaspoon -- Oral - Nectar Cup NT Oral - Nectar Straw Premature spillage;Delayed oral transit;Holding of bolus Oral - Thin Teaspoon -- Oral - Thin Cup Premature spillage;Delayed oral transit;Holding of bolus Oral -  Thin Straw Premature spillage;Delayed oral transit;Holding of bolus Oral - Puree Holding of bolus;Delayed oral transit Oral - Mech Soft -- Oral - Regular Delayed oral transit;Holding of bolus Oral - Multi-Consistency -- Oral - Pill -- Oral Phase - Comment --  CHL IP PHARYNGEAL PHASE 10/02/2020 Pharyngeal Phase Impaired Pharyngeal- Pudding Teaspoon -- Pharyngeal -- Pharyngeal- Pudding Cup -- Pharyngeal -- Pharyngeal- Honey Teaspoon -- Pharyngeal -- Pharyngeal- Honey Cup -- Pharyngeal -- Pharyngeal- Nectar Teaspoon -- Pharyngeal -- Pharyngeal- Nectar Cup NT Pharyngeal -- Pharyngeal- Nectar Straw Delayed swallow initiation-vallecula;Delayed swallow initiation-pyriform sinuses Pharyngeal -- Pharyngeal- Thin Teaspoon -- Pharyngeal -- Pharyngeal- Thin Cup Delayed swallow initiation-vallecula;Delayed swallow initiation-pyriform sinuses;Penetration/Aspiration before swallow;Moderate aspiration Pharyngeal Material enters airway, passes BELOW cords then ejected out Pharyngeal- Thin Straw Delayed swallow initiation-vallecula;Delayed swallow initiation-pyriform sinuses;Trace aspiration Pharyngeal -- Pharyngeal- Puree WFL Pharyngeal -- Pharyngeal- Mechanical Soft -- Pharyngeal -- Pharyngeal- Regular Delayed swallow initiation-vallecula Pharyngeal -- Pharyngeal- Multi-consistency -- Pharyngeal -- Pharyngeal- Pill --  Pharyngeal -- Pharyngeal Comment --  CHL IP CERVICAL ESOPHAGEAL PHASE 09/26/2020 Cervical Esophageal Phase WFL Pudding Teaspoon -- Pudding Cup -- Honey Teaspoon -- Honey Cup -- Nectar Teaspoon -- Nectar Cup -- Nectar Straw -- Thin Teaspoon -- Thin Cup -- Thin Straw -- Puree -- Mechanical Soft -- Regular -- Multi-consistency -- Pill -- Cervical Esophageal Comment -- Herbie Baltimore, MA CCC-SLP Acute Rehabilitation Services Pager [phone] Office [phone] Lynann Beaver 10/02/2020, 3:04 PM              DG Swallowing Func-Speech Pathology  Result Date: 09/26/2020 Objective Swallowing Evaluation: Type of Study: MBS-Modified Barium Swallow Study  Patient Details Name: NAISHA WISDOM MRN: [id number] Date of Birth: 04/29/1946 Today's Date: 09/26/2020 Time: SLP Start Time (ACUTE ONLY): 1412 -SLP Stop Time (ACUTE ONLY): 1429 SLP Time Calculation (min) (ACUTE ONLY): 17 min Past Medical History: Past Medical History: Diagnosis Date . ALLERGIC RHINITIS 06/02/2007 . Allergy  . Arthritis   DDD of cervical, thoracic, and lumbar spine . Blood transfusion without reported diagnosis  . Cataract   Bilateral removed cateracts . Cervical spondylosis   C5-6 and C6-7.  Pain mgmt as per Dr. Nelva Bush. . Chronic gastritis without bleeding  . Chronic low back pain 02/18/2009  s/p fusion L3-4 through L5-S1.  Pain meds per Dr. Nelva Bush . Chronic renal insufficiency, stage 3 (moderate)   borderline II/III (GFR about 55 ml/min) as of 2021 . DEPRESSION 06/02/2007 . Diverticulosis  . GERD (gastroesophageal reflux disease)  . Heart murmur  . Hiatal hernia  . Hypercholesterolemia  . HYPOTHYROIDISM 06/02/2007  GOITER . IBS (irritable bowel syndrome)  . INSOMNIA 06/02/2007 . Lymphocytic colitis  . Muscle spasms of lower extremity  . Nasal septal perforation   chronic; hx of epistaxis . Normocytic anemia 04/2015  HEME + in ED 08/07/15--endoscopic eval unrevealing.  Iron studies fine-->?Anemia of chronic dz (lymphocytic colitis?).  Hb 02/2020 11.5. .  OSTEOPENIA 02/18/2009  Repeat DEXA 07/2014 showed osteoporosis by T score in radius but spine and hip T scores were normal-continue vit D and calcium and repeat DEXA 08/2016 unchanged.  Repeat DEXA 2 yrs. . Peripheral edema   LE's, nonpitting . Renal cyst, acquired, right   1.2 cm, 2021 . Simple hepatic cyst   x 1. 2021 . Trochanteric bursitis of both hips   inj 08/11/20 Past Surgical History: Past Surgical History: Procedure Laterality Date . ABDOMINAL HYSTERECTOMY    for DUB (ovaries are still in) . BACK SURGERY    X 5: fusion of L3-L4 through L5-S1 . CATARACT  EXTRACTION Bilateral  . CHOLECYSTECTOMY  2012 . COLONOSCOPY  06/2007; 01/2015  2008 Diverticulosis, o/w normal.  2016 showed lymphocytic colitis with mild diverticulosis: pt was started on oral budesonide at that time. . CT SCAN  09/03/2017  ABDOMEN, HEAD . DEXA  08/16/2014; 09/14/16  Hip and spine ok; radius osteoporotic---but meds not indicated.  Repeat 08/2018 . ESOPHAGOGASTRODUODENOSCOPY  10/2007; 11/27/15; 04/26/20  03/2020: chronic gastritis, H pylori neg, celiac neg.  +Barretts.  Hyperplastic gastric polyp. Marland Kitchen HIP CLOSED REDUCTION Right 09/20/2018  Procedure: CLOSED REDUCTION HIP;  Surgeon: Paralee Cancel, MD;  Location: [facility];  Service: Orthopedics;  Laterality: Right; . REVERSE SHOULDER ARTHROPLASTY Left 06/09/2019  Procedure: REVERSE SHOULDER ARTHROPLASTY;  Surgeon: Netta Cedars, MD;  Location: [facility];  Service: Orthopedics;  Laterality: Left; . SHOULDER SURGERY Right   \ . TOTAL HIP ARTHROPLASTY  05/31/2012  Procedure: TOTAL HIP ARTHROPLASTY ANTERIOR APPROACH;  Surgeon: Mauri Pole, MD;  Location: [facility];  Service: Orthopedics;  Laterality: Left; . TOTAL HIP ARTHROPLASTY Right 08/30/2018  Procedure: RIGHT TOTAL HIP ARTHROPLASTY ANTERIOR APPROACH;  Surgeon: Paralee Cancel, MD;  Location: [facility];  Service: Orthopedics;  Laterality: Right;  70 mins HPI: Patient is a 74 y.o. female with PMHx of HLD, hypothyroidism, GERD, lymphocytic colitis, anxiety/depression,  chronic back pain on narcotics-presented with several days history of shortness of breath-found to have acute hypoxic respiratory failure secondary to COVID-19 pneumonia.  Patient started to develop some lethargy 9/24, progressive, with no improvement despite holding Xanax and oxycodone, MRI brain significant for acute CVA 9/26 .desat to 83 on oxygen. 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  Swallow eval ordered.  Subjective: pt awake in bed Assessment / Plan / Recommendation CHL IP CLINICAL IMPRESSIONS 09/26/2020 Clinical Impression Severe oropharyngeal dysphagia marked by oral holding, lingual residue, inefficient posterior propulsion, late swallow initiation, decreased pharyngeal contraction. Initially able to propel nectar and first trial of puree but subsequent attempts were unsuccessful. Pt held head in an extended postion (diagnosed with cervical spinal degeneration with C3-4 and C4-5 anterolisthesis; dry spoon presentations and verbal cues were not effective. Barium reached her pyriform sinuses for several minutes and was eventually suctioned. Swallow initiated late therefore nectar thick barium dumped to her small pyriform sinus cavities and immediately fell into trachea with immediate cough. Residue in valleculae was max with puree. Recommend continued NPO and ST in attempts to return to po's. Continue oral care.     SLP Visit Diagnosis Dysphagia, oropharyngeal phase (R13.12) Attention and concentration deficit following -- Frontal lobe and executive function deficit following -- Impact on safety and function Severe aspiration risk   CHL IP TREATMENT RECOMMENDATION 09/26/2020 Treatment Recommendations Therapy as outlined in treatment plan below   Prognosis 09/26/2020 Prognosis for Safe Diet Advancement Fair Barriers to Reach Goals Severity of deficits Barriers/Prognosis Comment -- CHL IP DIET RECOMMENDATION 09/26/2020 SLP  Diet Recommendations NPO Liquid Administration via -- Medication Administration Via alternative means Compensations -- Postural Changes --   CHL IP OTHER RECOMMENDATIONS 09/26/2020 Recommended Consults -- Oral Care Recommendations Oral care QID Other Recommendations --   CHL IP FOLLOW UP RECOMMENDATIONS 09/26/2020 Follow up Recommendations Skilled Nursing facility   [facility] IP FREQUENCY AND DURATION 09/26/2020 Speech Therapy Frequency (ACUTE ONLY) min 2x/week Treatment Duration 2 weeks      CHL IP ORAL PHASE 09/26/2020 Oral Phase Impaired Oral - Pudding Teaspoon -- Oral - Pudding Cup -- Oral - Honey Teaspoon -- Oral -  Honey Cup -- Oral - Nectar Teaspoon -- Oral - Nectar Cup Delayed oral transit;Decreased bolus cohesion;Holding of bolus;Reduced posterior propulsion;Lingual/palatal residue Oral - Nectar Straw -- Oral - Thin Teaspoon -- Oral - Thin Cup -- Oral - Thin Straw -- Oral - Puree Holding of bolus;Delayed oral transit;Weak lingual manipulation;Reduced posterior propulsion;Lingual/palatal residue Oral - Mech Soft -- Oral - Regular -- Oral - Multi-Consistency -- Oral - Pill -- Oral Phase - Comment --  CHL IP PHARYNGEAL PHASE 09/26/2020 Pharyngeal Phase Impaired Pharyngeal- Pudding Teaspoon -- Pharyngeal -- Pharyngeal- Pudding Cup -- Pharyngeal -- Pharyngeal- Honey Teaspoon -- Pharyngeal -- Pharyngeal- Honey Cup -- Pharyngeal -- Pharyngeal- Nectar Teaspoon -- Pharyngeal -- Pharyngeal- Nectar Cup Delayed swallow initiation-pyriform sinuses;Pharyngeal residue - valleculae;Pharyngeal residue - pyriform Pharyngeal -- Pharyngeal- Nectar Straw -- Pharyngeal -- Pharyngeal- Thin Teaspoon -- Pharyngeal -- Pharyngeal- Thin Cup -- Pharyngeal -- Pharyngeal- Thin Straw -- Pharyngeal -- Pharyngeal- Puree Delayed swallow initiation-vallecula;Reduced pharyngeal peristalsis;Pharyngeal residue - pyriform;Reduced tongue base retraction Pharyngeal -- Pharyngeal- Mechanical Soft -- Pharyngeal -- Pharyngeal- Regular -- Pharyngeal -- Pharyngeal-  Multi-consistency -- Pharyngeal -- Pharyngeal- Pill -- Pharyngeal -- Pharyngeal Comment --  CHL IP CERVICAL ESOPHAGEAL PHASE 09/26/2020 Cervical Esophageal Phase WFL Pudding Teaspoon -- Pudding Cup -- Honey Teaspoon -- Honey Cup -- Nectar Teaspoon -- Nectar Cup -- Nectar Straw -- Thin Teaspoon -- Thin Cup -- Thin Straw -- Puree -- Mechanical Soft -- Regular -- Multi-consistency -- Pill -- Cervical Esophageal Comment -- [place] Siren 09/26/2020, 4:48 PM Orbie Pyo Colvin Caroli.Ed Actor Pager [phone] Office [phone]              ECHOCARDIOGRAM COMPLETE  Result Date: 09/23/2020    ECHOCARDIOGRAM REPORT   Patient Name:   SHAKISHA ABEND Date of Exam: 09/23/2020 Medical Rec #:  [id number]       Height:       64.0 in Accession #:    [id number]      Weight:       145.9 lb Date of Birth:  1946/12/04       BSA:          22.711 m Patient Age:    69 years        BP:           139/82 mmHg Patient Gender: F               HR:           84 bpm. Exam Location:  Inpatient Procedure: 2D Echo, Cardiac Doppler and Color Doppler Indications:    Stroke 434.91 / I163.9  History:        Patient has no prior history of Echocardiogram examinations.                 Signs/Symptoms:Murmur; Risk Factors:Dyslipidemia. COVID-19                 Positive. CKD. GERD. Hypothyroidism.  Sonographer:    Jonelle Sidle Dance Referring Phys: [id number]  1. Left ventricular ejection fraction, by estimation, is 65 to 70%. The left ventricle has normal function. The left ventricle has no regional wall motion abnormalities. There is mild concentric left ventricular hypertrophy. Left ventricular diastolic parameters are consistent with Grade I diastolic dysfunction (impaired relaxation).  2. Right ventricular systolic function is normal. The right ventricular size is normal.  3. Left atrial size was mildly dilated.  4. The mitral valve is normal in structure. Mild mitral valve regurgitation. No evidence  of mitral  stenosis. Moderate mitral annular calcification.  5. The aortic valve is normal in structure. Aortic valve regurgitation is not visualized. No aortic stenosis is present.  6. The inferior vena cava is normal in size with greater than 50% respiratory variability, suggesting right atrial pressure of 3 mmHg. Comparison(s): No prior Echocardiogram. Conclusion(s)/Recommendation(s): No intracardiac source of embolism detected on this transthoracic study. A transesophageal echocardiogram is recommended to exclude cardiac source of embolism if clinically indicated. FINDINGS  Left Ventricle: Left ventricular ejection fraction, by estimation, is 65 to 70%. The left ventricle has normal function. The left ventricle has no regional wall motion abnormalities. The left ventricular internal cavity size was normal in size. There is  mild concentric left ventricular hypertrophy. Left ventricular diastolic parameters are consistent with Grade I diastolic dysfunction (impaired relaxation). Normal left ventricular filling pressure. Right Ventricle: The right ventricular size is normal. No increase in right ventricular wall thickness. Right ventricular systolic function is normal. Left Atrium: Left atrial size was mildly dilated. Right Atrium: Right atrial size was normal in size. Pericardium: There is no evidence of pericardial effusion. Mitral Valve: The mitral valve is normal in structure. There is mild thickening of the mitral valve leaflet(s). There is mild calcification of the mitral valve leaflet(s). Moderate mitral annular calcification. Mild mitral valve regurgitation. No evidence of mitral valve stenosis. Tricuspid Valve: The tricuspid valve is normal in structure. Tricuspid valve regurgitation is mild . No evidence of tricuspid stenosis. Aortic Valve: The aortic valve is normal in structure. Aortic valve regurgitation is not visualized. No aortic stenosis is present. Pulmonic Valve: The pulmonic valve was normal in structure.  Pulmonic valve regurgitation is not visualized. No evidence of pulmonic stenosis. Aorta: The aortic root is normal in size and structure. Venous: The inferior vena cava is normal in size with greater than 50% respiratory variability, suggesting right atrial pressure of 3 mmHg. IAS/Shunts: No atrial level shunt detected by color flow Doppler.  LEFT VENTRICLE PLAX 2D LVIDd:         3.80 cm  Diastology LVIDs:         2.40 cm  LV e' lateral:   9.46 cm/s LV PW:         1.10 cm  LV E/e' lateral: 8.7 LV IVS:        1.10 cm LVOT diam:     2.10 cm LV SV:         105 LV SV Index:   61 LVOT Area:     3.46 cm  RIGHT VENTRICLE             IVC RV Basal diam:  2.00 cm     IVC diam: 1.20 cm RV S prime:     13.90 cm/s TAPSE (M-mode): 1.5 cm LEFT ATRIUM             Index       RIGHT ATRIUM          Index LA diam:        4.00 cm 2.34 cm/m  RA Area:     8.10 cm LA Vol (A2C):   48.1 ml 28.11 ml/m RA Volume:   13.00 ml 7.60 ml/m LA Vol (A4C):   34.5 ml 20.16 ml/m LA Biplane Vol: 42.3 ml 24.72 ml/m  AORTIC VALVE LVOT Vmax:   148.00 cm/s LVOT Vmean:  98.300 cm/s LVOT VTI:    0.302 m  AORTA Ao Root diam: 3.20 cm Ao Asc diam:  3.10 cm MITRAL VALVE  MV Area (PHT): 2.53 cm     SHUNTS MV Decel Time: 300 msec     Systemic VTI:  0.30 m MV E velocity: 81.90 cm/s   Systemic Diam: 2.10 cm MV A velocity: 133.00 cm/s MV E/A ratio:  0.62 Ena Dawley MD Electronically signed by Ena Dawley MD Signature Date/Time: 09/23/2020/5:19:20 PM    Final

## 2020-10-04 NOTE — Progress Notes (Signed)
Occupational Therapy Treatment Patient Details Name: April Brewer MRN: [id number] DOB: [birth year] Today's Date: 10/04/2020    History of present illness Pt is 74 y.o. female with medical history significant of hyperlipidemia, hypothyroidism, GERD, lymphocytic colitis, anxiety, and depression presented with complaints of shortness of breath and cough over the last 2 - 3 days. Noted associated symptoms of fever, loss of taste/smell, poor appetite, nausea, generalized weakness, and diarrhea. COVID-19 screening was positive. Pt admitted with COVID 19 PNE resp failure and on high flow oxygen.  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   OT comments  Pt demonstrating improved bed mobility during this session with decreased physical assist for pivot transfers, but still benefits from +2 assist due to quick onset of fatigue. Pt noted with incontinence on entry, Total A for cleanup. Pt able to tolerate activity on 4 L O2 during session with lowest reading at 84%, sustained 87-88% at rest in recliner chair. Pt Min A for self feeding, reporting decreased appetite. Based on activity tolerance, updated recommendations to SNF for short term rehab.    Follow Up Recommendations  SNF;Supervision/Assistance - 24 hour    Equipment Recommendations  3 in 1 bedside commode;Wheelchair (measurements OT);Wheelchair cushion (measurements OT)    Recommendations for Other Services      Precautions / Restrictions Precautions Precautions: Fall;Other (comment) Precaution Comments: watch vitals Restrictions Weight Bearing Restrictions: No       Mobility Bed Mobility Overal bed mobility: Needs Assistance Bed Mobility: Supine to Sit     Supine to sit: Min assist;+2 for physical assistance;HOB elevated     General  bed mobility comments: MinAx2 to get to EOB today, more mobility and most assist given with sitting trunk up to EOB  Transfers Overall transfer level: Needs assistance Equipment used: Rolling walker (2 wheeled) Transfers: Sit to/from [facility] Sit to Stand: Mod assist;+2 physical assistance Stand pivot transfers: +2 physical assistance;Min assist       General transfer comment: still needs heavy ModAx2 to really boost up to full upright, but gained balance much quicker and able to maintain standing a bit longer today with no posterior lean; still too fatigued to progress past pivotal steps to chair with RW    Balance Overall balance assessment: Needs assistance Sitting-balance support: Bilateral upper extremity supported;Feet unsupported Sitting balance-Leahy Scale: Fair Sitting balance - Comments: close min guard for safety to maintain midline today   Standing balance support: Bilateral upper extremity supported;During functional activity Standing balance-Leahy Scale: Poor Standing balance comment: reliant on external support                           ADL either performed or assessed with clinical judgement   ADL Overall ADL's : Needs assistance/impaired Eating/Feeding: Minimal assistance;Sitting Eating/Feeding Details (indicate cue type and reason): Min A for self feeding today to scoop food. Pt with decreased appetite             Upper Body Dressing : Minimal assistance;Sitting Upper Body Dressing Details (indicate cue type and reason): Min A to don [facility] hospital down sitting EOB Lower Body Dressing: Moderate assistance;Bed level Lower Body Dressing Details (indicate cue type and reason): Mod A for donning socks bed level. pt able to don R sock without assistance but  fatigued and required assist for L sock     Toileting- Clothing Manipulation and Hygiene: Total assistance;Sit to/from stand Toileting - Clothing Manipulation Details (indicate cue  type and reason): Total A for peri care in standing after incontinence       General ADL Comments: Pt pleasant and agreeable to attempt all activities. Continues with weakness, decreased balance, decreased cardiopulmonary tolerance with difficulty slowing breathing to maintain stable vitals     Vision   Vision Assessment?: No apparent visual deficits   Perception     Praxis      Cognition Arousal/Alertness: Awake/alert Behavior During Therapy: Flat affect Overall Cognitive Status: Impaired/Different from baseline Area of Impairment: Orientation;Attention;Memory;Safety/judgement;Problem solving;Following commands;Awareness                 Orientation Level: Disoriented to;Time;Situation (Pt reporting August) Current Attention Level: Sustained Memory: Decreased short-term memory Following Commands: Follows one step commands consistently;Follows one step commands with increased time Safety/Judgement: Decreased awareness of deficits;Decreased awareness of safety Awareness: Intellectual Problem Solving: Slow processing;Decreased initiation;Requires verbal cues;Requires tactile cues;Difficulty sequencing General Comments: still with slow processing time and more interactive today, ongoing impairments in safety awareness and limited recall of PLB/breathing techniques        Exercises     Shoulder Instructions       General Comments Pt received on 4 L O2 and able to maintain 84-92% during activity. After activity and seated in recliner, sustained 87-88%. Difficulty slowing breathing and using pursed lip breathing    Pertinent Vitals/ Pain       Pain Assessment: Faces Faces Pain Scale: Hurts a little bit Pain Location: generalized Pain Descriptors / Indicators: Discomfort Pain Intervention(s): Limited activity within patient's tolerance;Monitored during session  Home Living                                          Prior Functioning/Environment               Frequency  Min 3X/week        Progress Toward Goals  OT Goals(current goals can now be found in the care plan section)  Progress towards OT goals: Progressing toward goals  Acute Rehab OT Goals Patient Stated Goal: feel better OT Goal Formulation: With patient Time For Goal Achievement: 10/14/20 Potential to Achieve Goals: Good ADL Goals Pt Will Perform Grooming: with modified independence;standing;sitting Pt Will Perform Lower Body Dressing: with modified independence;sitting/lateral leans;sit to/from stand Pt Will Transfer to Toilet: with modified independence;bedside commode;ambulating Pt Will Perform Toileting - Clothing Manipulation and hygiene: with modified independence;sitting/lateral leans;sit to/from stand Additional ADL Goal #1: Pt will independently monitor SpO2 and use purse lip breathing for ADLs Additional ADL Goal #2: Pt will independently verbalize three energy conservation techniques for ADLs and IADLs  Plan Discharge plan needs to be updated    Co-evaluation    PT/OT/SLP Co-Evaluation/Treatment: Yes Reason for Co-Treatment: Complexity of the patient's impairments (multi-system involvement);For patient/therapist safety;To address functional/ADL transfers   OT goals addressed during session: ADL's and self-care      AM-PAC OT "6 Clicks" Daily Activity     Outcome Measure   Help from another person eating meals?: A Little Help from another person taking care of personal grooming?: A Little Help from another person toileting, which includes using toliet, bedpan, or urinal?: Total Help from another person bathing (including washing, rinsing, drying)?: A Lot Help from another  person to put on and taking off regular upper body clothing?: A Little Help from another person to put on and taking off regular lower body clothing?: Total 6 Click Score: 13    End of Session Equipment Utilized During Treatment: Gait belt;Rolling walker;Oxygen  OT Visit  Diagnosis: Unsteadiness on feet (R26.81);Other abnormalities of gait and mobility (R26.89);Muscle weakness (generalized) (M62.81);Other symptoms and signs involving cognitive function   Activity Tolerance Patient limited by fatigue   Patient Left in chair;with call bell/phone within reach;with chair alarm set   Nurse Communication Mobility status        Time: 4401-0272 OT Time Calculation (min): 32 min  Charges: OT General Charges $OT Visit: 1 Visit OT Treatments $Self Care/Home Management : 8-22 mins  Layla Maw, OTR/L   Layla Maw 10/04/2020, 11:10 AM

## 2020-10-04 NOTE — Progress Notes (Signed)
This RN took over pt care on the non-covid side of the unit. Pt made comfortable in bed, tele-monitor connected, pulse ox connected and pt on 4LNC. I agree with daily assessment charted and have added a focused reassessment. Will continue to monitor.

## 2020-10-04 NOTE — Progress Notes (Signed)
IR team aware of request for perc G-tube. Chart reviewed. Have ordered CT abdomen without contrast to evaluate anatomy for percutaneous candidacy. If pt candidate, Plavix would need to held minimum of 5 days prior to procedure.   Ascencion Dike PA-C Interventional Radiology 10/04/2020 1:53 PM

## 2020-10-04 NOTE — Progress Notes (Signed)
Nutrition Follow-up  DOCUMENTATION CODES:   Not applicable  INTERVENTION:   Initiate Calorie Count; plan to start tomorrow at Breakfast and continue for 48 hours  Magic cup TID with meals, each supplement provides 290 kcal and 9 grams of protein  Ensure Enlive po TID, each supplement provides 350 kcal and 20 grams of protein. Thicken as needed to obtain nectar thick consistency per diet order  Change to Nocturnal TF to see if this assists with oral intake; if after assessing po intake from calorie count, pt not meeting nutritional needs, will reassess TF regimen  Tube Feeding via Cortrak: Nocturnal x 14 hours (1800 to 0800) Osmolite 1.5 at 70 ml/hr x 14 hours Pro-Source 45 mL BID Provides  83 g of protein, 1550 kcals  Meets 75% calorie and protein needs    NUTRITION DIAGNOSIS:   Increased nutrient needs related to acute illness (COVID-19) as evidenced by estimated needs.  Being addressed via TF, supplements, calorie count  GOAL:   Patient will meet greater than or equal to 90% of their needs   MONITOR:   Diet advancement, Labs, Weight trends, TF tolerance, Skin, I & O's  REASON FOR ASSESSMENT:   Consult Enteral/tube feeding initiation and management  ASSESSMENT:   April Brewer is a 74 y.o. female with medical history significant of hyperlipidemia, hypothyroidism, GERD, lymphocytic colitis, anxiety, and depression presented with complaints of shortness of breath and cough over the last 2 - 3 days.  She reports that her cough has been productive.  Noted associated symptoms of fever, loss of taste/smell, poor appetite, nausea, generalized weakness, and diarrhea.  She reports that she had not received any COVID-19 vaccines due to concerns that it may not be safe.  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 Noted MD requesting more information regarding po intake to ascertain if meeting calories; RD to order calorie count but based on current information of percent meals eaten, pt is not meeting nutritional needs  Current wt down to 61.6 kg; admit weight 66 kg.   Osmolite 1.5 at 55 ml/hr, Pro-Source TF 45 mL BID and free water 100 mL q 6 hours via Cortrak tube  Labs: sodium 133 (L) Meds: reviewed  Diet Order:   Diet Order            DIET - DYS 1 Room service appropriate? Yes; Fluid consistency: Nectar Thick  Diet effective now                 EDUCATION NEEDS:   No education needs have been identified at this time  Skin:  Skin Assessment: Reviewed RN Assessment  Last BM:  10/01/20  Height:   Ht Readings from Last 1 Encounters:  09/14/2020 5\' 4"  (1.626 m)    Weight:   Wt Readings from Last 1 Encounters:  10/04/20 61.1 kg    Ideal Body Weight:  54.5 kg  BMI:  Body mass index is 23.12 kg/m.  Estimated Nutritional Needs:   Kcal:  2000-2200  Protein:  105-120 grams  Fluid:  > 2 L   Kerman Passey MS, RDN, LDN, CNSC Registered Dietitian III Clinical Nutrition RD Pager and On-Call Pager Number Located in [place]

## 2020-10-04 NOTE — Progress Notes (Signed)
Spoke with nurse regarding Lab consult. No labs ordered at this time. Fran Lowes, RN VAST

## 2020-10-04 NOTE — Progress Notes (Signed)
Physical Therapy Treatment Patient Details Name: April Brewer MRN: [id number] DOB: [birth year] Today's Date: 10/04/2020    History of Present Illness Pt is 74 y.o. female with medical history significant of hyperlipidemia, hypothyroidism, GERD, lymphocytic colitis, anxiety, and depression presented with complaints of shortness of breath and cough over the last 2 - 3 days. Noted associated symptoms of fever, loss of taste/smell, poor appetite, nausea, generalized weakness, and diarrhea. COVID-19 screening was positive. Pt admitted with COVID 19 PNE resp failure and on high flow oxygen.  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    PT Comments    Patient received in bed, much more interactive today and able to ask slightly longer questions/statements. Still needs assist of 2 persons for safe functional mobility, but able to maintain static standing for slightly longer without sitting rest breaks today, also needed slightly less assist for pivot to chair. Remained too fatigued to attempt gait this session. Did show improvement in respiratory status- able to maintain on 4LPM during session ranging from 84-92% with activity with RW. Left positioned to comfort in chair with alarm active and all needs met, sitting at 87-88% on 4LPM and with sats slowly improving, nurse tech aware of patient status.     Follow Up Recommendations  SNF;Supervision/Assistance - 24 hour     Equipment Recommendations  Rolling walker with 5" wheels;3in1 (PT);Wheelchair cushion (measurements PT);Wheelchair (measurements PT)    Recommendations for Other Services       Precautions / Restrictions Precautions Precautions: Fall;Other (comment) Precaution Comments: watch vitals Restrictions Weight Bearing Restrictions: No     Mobility  Bed Mobility Overal bed mobility: Needs Assistance Bed Mobility: Supine to Sit     Supine to sit: Min assist;+2 for physical assistance;HOB elevated     General bed mobility comments: MinAx2 to get to EOB today, more mobility and most assist given with sitting trunk up to EOB  Transfers Overall transfer level: Needs assistance Equipment used: Rolling walker (2 wheeled) Transfers: Sit to/from [facility] Sit to Stand: Mod assist;+2 physical assistance Stand pivot transfers: +2 physical assistance;Min assist       General transfer comment: still needs heavy ModAx2 to really boost up to full upright, but gained balance much quicker and able to maintain standing a bit longer today with no posterior lean; still too fatigued to progress past pivotal steps to chair with RW  Ambulation/Gait             General Gait Details: pivotal steps to recliner   Stairs             Wheelchair Mobility    Modified Rankin (Stroke Patients Only)       Balance Overall balance assessment: Needs assistance Sitting-balance support: Bilateral upper extremity supported;Feet unsupported Sitting balance-Leahy Scale: Fair Sitting balance - Comments: close min guard for safety to maintain midline today   Standing balance support: Bilateral upper extremity supported;During functional activity Standing balance-Leahy Scale: Poor Standing balance comment: reliant on external support                            Cognition Arousal/Alertness: Awake/alert Behavior During Therapy: Flat affect Overall Cognitive Status: Impaired/Different from baseline  Current Attention Level: Sustained Memory: Decreased short-term memory Following Commands: Follows one step commands consistently;Follows one step commands with increased time Safety/Judgement: Decreased awareness of deficits;Decreased awareness of safety Awareness:  Intellectual Problem Solving: Slow processing;Decreased initiation;Requires verbal cues;Requires tactile cues;Difficulty sequencing General Comments: still with slow processing time and more interactive today, ongoing impairments in safety awareness and limited recall of PLB/breathing techniques      Exercises      General Comments General comments (skin integrity, edema, etc.): on 4LPM for session and maintained between 84-92%; at EOS did stay at 87-88% on 4LPM and needed cues for PLB but also not desatting into lower 80s      Pertinent Vitals/Pain Pain Assessment: Faces Faces Pain Scale: Hurts a little bit Pain Location: generalized Pain Descriptors / Indicators: Discomfort Pain Intervention(s): Limited activity within patient's tolerance;Monitored during session;Repositioned    Home Living                      Prior Function            PT Goals (current goals can now be found in the care plan section) Acute Rehab PT Goals Patient Stated Goal: feel better PT Goal Formulation: With patient Time For Goal Achievement: 10/08/20 Potential to Achieve Goals: Good Additional Goals Additional Goal #1: Patient will demonstrate correct use of flutter valve and incentive spirometer. Will state correct frequency for using each. Progress towards PT goals: Progressing toward goals (slowly)    Frequency    Min 3X/week      PT Plan Current plan remains appropriate    Co-evaluation              AM-PAC PT "6 Clicks" Mobility   Outcome Measure  Help needed turning from your back to your side while in a flat bed without using bedrails?: A Little Help needed moving from lying on your back to sitting on the side of a flat bed without using bedrails?: A Little Help needed moving to and from a bed to a chair (including a wheelchair)?: A Lot Help needed standing up from a chair using your arms (e.g., wheelchair or bedside chair)?: A Lot Help needed to walk in hospital  room?: A Lot Help needed climbing 3-5 steps with a railing? : Total 6 Click Score: 13    End of Session Equipment Utilized During Treatment: Oxygen;Gait belt Activity Tolerance: Patient tolerated treatment well;Patient limited by fatigue Patient left: in chair;with call bell/phone within reach;with chair alarm set Nurse Communication: Mobility status PT Visit Diagnosis: Muscle weakness (generalized) (M62.81);Difficulty in walking, not elsewhere classified (R26.2);Ataxic gait (R26.0);Other symptoms and signs involving the nervous system (R29.898)     Time: 2330-0762 PT Time Calculation (min) (ACUTE ONLY): 35 min  Charges:  $Therapeutic Activity: 8-22 mins (co-tx with OT)                     Windell Norfolk, DPT, PN1   Supplemental Physical Therapist [place]    Pager [phone] Acute Rehab Office [phone]

## 2020-10-05 DIAGNOSIS — U071 COVID-19: Secondary | ICD-10-CM | POA: Diagnosis not present

## 2020-10-05 DIAGNOSIS — J1282 Pneumonia due to Coronavirus disease 2019: Secondary | ICD-10-CM | POA: Diagnosis not present

## 2020-10-05 LAB — GLUCOSE, CAPILLARY
Glucose-Capillary: 128 mg/dL — ABNORMAL HIGH (ref 70–99)
Glucose-Capillary: 122 mg/dL — ABNORMAL HIGH (ref 70–99)
Glucose-Capillary: 120 mg/dL — ABNORMAL HIGH (ref 70–99)
Glucose-Capillary: 143 mg/dL — ABNORMAL HIGH (ref 70–99)

## 2020-10-05 NOTE — Progress Notes (Addendum)
PROGRESS NOTE    April Brewer  MHD:[id number] DOB: [birth year] DOA: 09/02/2020 PCP: April Sou, MD  Brief Narrative: 74 year old female admitted with COVID-19 pneumonia she received COVID-19 treatment came off isolation 10/04/2020.  She has history of hyperlipidemia hypothyroidism GERD lymphocytic colitis anxiety depression chronic back pain.  Patient developed acute stroke on 09/22/2020 MRI positive for acute stroke.  Patient was unvaccinated for Covid.  Patient received steroids remdesivir and baricitinib.  9/17>>Chest x-ray: Diffuse interstitial and groundglass opacities 9/26>> MRI brain: Multifocal acute infarcts in the posterior circulation. 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  She does not appear to be in any distress.  She was initially on 15 L of high flow nasal cannula.  She finished a course of remdesivir however Versed never had to be held due to dysphagia secondary to acute stroke.  Inflammatory markers have improved.  She came off isolation 10/04/2020.  #2 acute ischemic stroke likely secondary to hypercoagulable state associated with COVID-19.  Patient is currently getting feeds through CorPak tube.  Followed by PT OT and speech therapy.  Echo with normal ejection fraction no embolic source seen.   MRI of the brain 09/22/2020 multifocal acute infarcts in the posterior circulation. Was followed  by neurology patient will need to be on aspirin and Plavix for 3 months.  #3 dysphagia secondary to acute stroke patient now has a core track tube through which she gets tube feeds.  IR team was consulted for PEG tube placement.  They recommended CT of the abdomen for evaluation of anatomy for percutaneous candidacy.  CT is done await IR recommendations.  Will need Plavix held for 5 days prior to procedure.  #9 hyponatremia/hypernatremia/hypokalemia-sodium 133 potassium 4.9  #10 transaminitis resolved  #11 hyperlipidemia continue statin  #12 anxiety and depression on Zoloft and lamotrigine  #13 hypothyroidism normal TSH continue Synthroid  #14 goals of care patient is DO NOT RESUSCITATE     Nutrition Problem: Increased nutrient needs Etiology: acute illness (COVID-19)     Signs/Symptoms: estimated needs    Interventions: Tube feeding  Estimated body mass index is 23.12 kg/m as calculated from the following:   Height as of this encounter: 5\' 4"  (1.626 m).   Weight as of this encounter: 61.1 kg.  DVT prophylaxis: Lovenox Code Status: DNR  family Communication: Called son April Brewer did not answer the phone disposition Plan:  Status is: Inpatient  Dispo:  Patient From: Home  Planned Disposition: [place]  Expected discharge date: 10/08/20  Medically stable for discharge: No   Consultants:   Neurology  Procedures: None Antimicrobials: None  Subjective: She is resting in bed Awake does not appear to be in any distress Having some dry cough  Objective: Vitals:   10/04/20 1927 10/04/20 1932 10/05/20 0556 10/05/20 0809  BP: 110/73  105/72 108/71  Pulse: 95  82 85  Resp: 19  17 19   Temp:  98.5 F (36.9 C)  98.1 F (36.7 C) 98.2 F (36.8 C)  TempSrc: Oral  Oral Oral  SpO2: 95% 92% 94% 96%  Weight:      Height:        Intake/Output Summary (Last 24 hours) at 10/05/2020 1244 Last data filed at 10/05/2020 0800 Gross per 24 hour  Intake 125 ml    Output 0 ml  Net 125 ml   Filed Weights   10/02/20 0420 10/03/20 0404 10/04/20 0500  Weight: 64.3 kg 64.7 kg 61.1 kg    Examination:  General exam: Appears calm and comfortable  Respiratory system: Scattered rhonchi bilaterally to auscultation. Respiratory effort normal. Cardiovascular system: S1 & S2 heard, RRR. No JVD, murmurs, rubs, gallops or clicks. No pedal edema. Gastrointestinal system: Abdomen is nondistended, soft and nontender. No organomegaly or masses felt. Normal bowel sounds heard. Central nervous system: Awake oriented moves all extremities  extremities: No edema Skin: No rashes, lesions or ulcers Psychiatry: Unable to assess    Data Reviewed: I have personally reviewed following labs and imaging studies  CBC: Recent Labs  Lab 10/04/20 0541  WBC 14.0*  HGB 12.2  HCT 37.6  MCV 91.7  PLT 921   Basic Metabolic Panel: Recent Labs  Lab 09/30/20 0500 10/01/20 0448 10/02/20 0440 10/03/20 0246 10/04/20 0541  NA 131* 132* 133* 131* 133*  K 4.1 4.5 4.6 4.5 4.9  CL 98 99 99 97* 97*  CO2 30 23 24 24 24   GLUCOSE 136* 111* 98 127* 119*  BUN 15 15 16 19 15   CREATININE 0.70 0.67 0.67 0.63 0.75  CALCIUM 8.1* 8.5* 8.8* 8.7* 9.2  MG 1.7 2.0  --   --   --    GFR: Estimated Creatinine Clearance: 52.5 mL/min (by C-G formula based on SCr of 0.75 mg/dL). Liver Function Tests: No results for input(s): AST, ALT, ALKPHOS, BILITOT, PROT, ALBUMIN in the last 168 hours. No results for input(s): LIPASE, AMYLASE in the last 168 hours. No results for input(s): AMMONIA in the last 168 hours. Coagulation Profile: No results for input(s): INR, PROTIME in the last 168 hours. Cardiac Enzymes: No results for input(s): CKTOTAL, CKMB, CKMBINDEX, TROPONINI in the last 168 hours. BNP (last 3 results) No results for input(s): PROBNP in the last 8760 hours. HbA1C: No results for input(s): HGBA1C in the last 72 hours. CBG: Recent Labs  Lab 10/04/20 1127 10/04/20 1612  10/04/20 2039 10/05/20 0630 10/05/20 1117  GLUCAP 145* 136* 145* 128* 122*   Lipid Profile: No results for input(s): CHOL, HDL, LDLCALC, TRIG, CHOLHDL, LDLDIRECT in the last 72 hours. Thyroid Function Tests: No results for input(s): TSH, T4TOTAL, FREET4, T3FREE, THYROIDAB in the last 72 hours. Anemia Panel: No results for input(s): VITAMINB12, FOLATE, FERRITIN, TIBC, IRON, RETICCTPCT in the last 72 hours. Sepsis Labs: No results for input(s): PROCALCITON, LATICACIDVEN in the last 168 hours.  No results found for this or any previous visit (from the past 240 hour(s)).       Radiology Studies: CT ABDOMEN WO CONTRAST  Result Date: 10/04/2020 CLINICAL DATA:  Anatomy for G-tube placement. EXAM: CT ABDOMEN WITHOUT CONTRAST TECHNIQUE: Multidetector CT imaging of the abdomen was performed following the standard protocol without IV contrast. COMPARISON:  10/11/2007 FINDINGS: Lower chest: Coarse ground-glass airspace opacities are noted at the lung bases bilaterally. There is significant architectural distortion.The heart is enlarged. Hepatobiliary: There is a small cyst in the left hepatic lobe. Status post cholecystectomy.There is no biliary ductal dilation. Pancreas: Normal contours without  ductal dilatation. No peripancreatic fluid collection. Spleen: Unremarkable. Adrenals/Urinary Tract: --Adrenal glands: Unremarkable. --Right kidney/ureter: No hydronephrosis or radiopaque kidney stones. --Left kidney/ureter: No hydronephrosis or radiopaque kidney stones. Stomach/Bowel: --Stomach/Duodenum: Significant portions of the gastric body are posterior to the left hepatic lobe. There is narrowing of a potential percutaneous window by the transverse colon. The enteric tube terminates in the region of the pylorus/duodenal bulb. There is a small hiatal hernia. --Small bowel: The visualized small bowel is unremarkable. --Colon: Contrast is noted within the transverse colon. The transverse colon is in close  proximity to significant portions of the gastric body. --Appendix: Outside the field of view. Vascular/Lymphatic: Atherosclerotic changes are noted. --there is no significant retroperitoneal adenopathy. --there is no significant mesenteric adenopathy. Other: No ascites or free air. Musculoskeletal. There is a subtle age-indeterminate compression fracture of the T11 superior endplate (sagittal series 7, image 60). There is osteopenia. There is partially visualized posterior fusion hardware involving the lumbar spine. IMPRESSION: 1. Anatomy as detailed above. 2. Coarse ground-glass airspace opacities at the lung bases with significant architectural distortion is consistent with the patient's history of viral pneumonia. 3. Age-indeterminate subtle compression fracture of the superior endplate of the X32 vertebral body. Correlation with point tenderness is recommended. 4. Additional chronic findings as detailed above. 5.  Aortic Atherosclerosis (ICD10-I70.0). Electronically Signed   By: Constance Holster M.D.   On: 10/04/2020 19:27        Scheduled Meds: . acidophilus  1 capsule Oral Daily  . albuterol  2 puff Inhalation Q6H  . aspirin  325 mg Per Tube Daily  . atorvastatin  10 mg Per Tube Daily  . clopidogrel  75 mg Per Tube Daily  . enoxaparin (LOVENOX) injection  40 mg Subcutaneous Q24H  . famotidine  20 mg Per Tube Daily  . feeding supplement (ENSURE ENLIVE)  237 mL Oral TID BM  . feeding supplement (OSMOLITE 1.5 CAL)  1,000 mL Per Tube Q24H  . feeding supplement (PROSource TF)  45 mL Per Tube BID  . free water  100 mL Per Tube Q6H  . influenza vaccine adjuvanted  0.5 mL Intramuscular Tomorrow-1000  . lamoTRIgine  100 mg Per Tube Daily  . levothyroxine  100 mcg Per Tube Q0600  . mouth rinse  15 mL Mouth Rinse BID  . metoprolol tartrate  25 mg Per Tube BID  . sertraline  100 mg Oral BID  . sodium chloride flush  10-40 mL Intracatheter Q12H  . sodium chloride flush  3 mL Intravenous Q12H    Continuous Infusions: . sodium chloride Stopped (09/15/20 0004)  . sodium chloride Stopped (09/14/20 2233)     LOS: 21 days  Georgette Shell, MD 10/05/2020, 12:44 PM

## 2020-10-05 NOTE — Progress Notes (Signed)
Calorie count started this morning. Pt ate good for lunch but not for breakfast and supper.  Excoriation noted to sacrum, peri-area with MASD. Moisture barier cream and anti fungal powder applied. Repositioned pt at intervals.

## 2020-10-05 NOTE — Plan of Care (Signed)
  Problem: Education: Goal: Knowledge of risk factors and measures for prevention of condition will improve Outcome: Progressing   Problem: Respiratory: Goal: Will maintain a patent airway Outcome: Progressing   Problem: Education: Goal: Knowledge of General Education information will improve Description: Including pain rating scale, medication(s)/side effects and non-pharmacologic comfort measures Outcome: Progressing   

## 2020-10-06 DIAGNOSIS — U071 COVID-19: Secondary | ICD-10-CM | POA: Diagnosis not present

## 2020-10-06 DIAGNOSIS — J1282 Pneumonia due to Coronavirus disease 2019: Secondary | ICD-10-CM | POA: Diagnosis not present

## 2020-10-06 LAB — GLUCOSE, CAPILLARY
Glucose-Capillary: 121 mg/dL — ABNORMAL HIGH (ref 70–99)
Glucose-Capillary: 114 mg/dL — ABNORMAL HIGH (ref 70–99)
Glucose-Capillary: 112 mg/dL — ABNORMAL HIGH (ref 70–99)
Glucose-Capillary: 168 mg/dL — ABNORMAL HIGH (ref 70–99)

## 2020-10-06 MED ORDER — ALBUTEROL SULFATE HFA 108 (90 BASE) MCG/ACT IN AERS
2.0000 | INHALATION_SPRAY | Freq: Four times a day (QID) | RESPIRATORY_TRACT | Status: DC | PRN
  Administered 2020-10-07 (×2): 2 via RESPIRATORY_TRACT
  Filled 2020-10-06: qty 6.7

## 2020-10-06 MED ORDER — ALBUTEROL SULFATE HFA 108 (90 BASE) MCG/ACT IN AERS
2.0000 | INHALATION_SPRAY | Freq: Two times a day (BID) | RESPIRATORY_TRACT | Status: DC
  Filled 2020-10-06: qty 6.7

## 2020-10-06 NOTE — Plan of Care (Signed)
Patient having increase in difficulty with nectar thick and pills. Held all P.O.s will re-order speech.

## 2020-10-06 NOTE — Progress Notes (Addendum)
PROGRESS NOTE    April Brewer  IWO:[id number] DOB: [birth year] DOA: 09/11/2020 PCP: Tammi Sou, MD  Brief Narrative: 74 year old female admitted with COVID-19 pneumonia she received COVID-19 treatment came off isolation 10/04/2020.  She has history of hyperlipidemia hypothyroidism GERD lymphocytic colitis anxiety depression chronic back pain.  Patient developed acute stroke on 09/22/2020 MRI positive for acute stroke.  Patient was unvaccinated for Covid.  Patient received steroids remdesivir and baricitinib.  9/17>>Chest x-ray: Diffuse interstitial and groundglass opacities 9/26>> MRI brain: Multifocal acute infarcts in the posterior circulation. 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  She does not appear to be in any distress.  She was initially on 15 L of high flow nasal cannula.  She finished a course of remdesivir however Versed never had to be held due to dysphagia secondary to acute stroke.  Inflammatory markers have improved.  She came off isolation 10/04/2020.  #2 acute ischemic stroke likely secondary to hypercoagulable state associated with COVID-19.  Patient is currently getting feeds through CorPak tube.  Followed by PT OT and speech therapy.  Echo with normal ejection fraction no embolic source seen.   MRI of the brain 09/22/2020 multifocal acute  infarcts in the posterior circulation. Was followed by neurology patient will need to be on aspirin and Plavix for 3 months.  #3 dysphagia secondary to acute stroke patient now has a core track tube through which she gets tube feeds.  IR team was consulted for PEG tube placement.  They recommended CT of the abdomen for evaluation of anatomy for percutaneous candidacy.  Will have to hold Plavix for 5 days for IR to do a PEG tube. Plavix stopped on 10/06/2020 Please restart Plavix after PEG tube is placed  #9 hyponatremia/hypernatremia/hypokalemia-sodium 133 potassium 4.9  #10 transaminitis resolved  #11 hyperlipidemia continue statin  #12 anxiety and depression on Zoloft and lamotrigine  #13 hypothyroidism normal TSH continue Synthroid  #14 goals of care patient is DO NOT RESUSCITATE     Nutrition Problem: Increased nutrient needs Etiology: acute illness (COVID-19)     Signs/Symptoms: estimated needs    Interventions: Tube feeding  Estimated body mass index is 23.12 kg/m as calculated from the following:   Height as of this encounter: 5\' 4"  (1.626 m).   Weight as of this encounter: 61.1 kg.  DVT prophylaxis: Lovenox Code Status: DNR  family Communication: Called son [name] and [place] did not answer the phone/ disposition Plan:  Status is: Inpatient  Dispo:  Patient From: Home  Planned Disposition: [place]  Expected discharge date: 10/08/20  Medically stable for discharge: No   Consultants:   Neurology  Procedures: None Antimicrobials: None  Subjective: Patient is resting in bed No events overnight Objective: Vitals:   10/05/20 1921 10/05/20 2203 10/06/20 0600 10/06/20 0745  BP: 106/73 116/70  115/72  Pulse: 91 97 92 75  Resp: 17  20 17   Temp: 98 F (36.7 C)   98.3 F (36.8 C)  TempSrc: Oral   Oral  SpO2: 95%  91% 96%  Weight:      Height:        Intake/Output Summary (Last 24 hours) at 10/06/2020 0950 Last data filed at  10/05/2020 1808 Gross per 24 hour  Intake 520 ml  Output --  Net 520 ml   Filed Weights   10/02/20 0420 10/03/20 0404 10/04/20 0500  Weight: 64.3 kg 64.7 kg 61.1 kg    Examination:  General exam: Appears calm and comfortable  Respiratory system: Scattered rhonchi bilaterally to auscultation. Respiratory effort normal. Cardiovascular system: S1 & S2 heard, RRR. No JVD, murmurs, rubs, gallops or clicks. No pedal edema. Gastrointestinal system: Abdomen is nondistended, soft and nontender. No organomegaly or masses felt. Normal bowel sounds heard. Central nervous system: Awake oriented moves all extremities  extremities: No edema Skin: No rashes, lesions or ulcers Psychiatry: Unable to assess    Data Reviewed: I have personally reviewed following labs and imaging studies  CBC: Recent Labs  Lab 10/04/20 0541  WBC 14.0*  HGB 12.2  HCT 37.6  MCV 91.7  PLT 017   Basic Metabolic Panel: Recent Labs  Lab 09/30/20 0500 10/01/20 0448 10/02/20 0440 10/03/20 0246 10/04/20 0541  NA 131* 132* 133* 131* 133*  K 4.1 4.5 4.6 4.5 4.9  CL 98 99 99 97* 97*  CO2 30 23 24 24 24   GLUCOSE 136* 111* 98 127* 119*  BUN 15 15 16 19 15   CREATININE 0.70 0.67 0.67 0.63 0.75  CALCIUM 8.1* 8.5* 8.8* 8.7* 9.2  MG 1.7 2.0  --   --   --    GFR: Estimated Creatinine Clearance: 52.5 mL/min (by C-G formula based on SCr of 0.75 mg/dL). Liver Function Tests: No results for input(s): AST, ALT, ALKPHOS, BILITOT, PROT, ALBUMIN in the last 168 hours. No results for input(s): LIPASE, AMYLASE in the last 168 hours. No results for input(s): AMMONIA in the last 168 hours. Coagulation Profile: No results for input(s): INR, PROTIME in the last 168 hours. Cardiac Enzymes: No results for input(s): CKTOTAL, CKMB, CKMBINDEX, TROPONINI in the last 168 hours. BNP (last 3 results) No results for input(s): PROBNP in the last 8760 hours. HbA1C: No results for input(s): HGBA1C in the last 72 hours. CBG: Recent  Labs  Lab 10/05/20 0630 10/05/20 1117 10/05/20 1640 10/05/20 2021 10/06/20 0644  GLUCAP 128* 122* 120* 143* 121*   Lipid Profile: No results for input(s): CHOL, HDL, LDLCALC, TRIG, CHOLHDL, LDLDIRECT in the last 72 hours. Thyroid Function Tests: No results for input(s): TSH, T4TOTAL, FREET4, T3FREE, THYROIDAB in the last 72 hours. Anemia Panel: No results for input(s): VITAMINB12, FOLATE, FERRITIN, TIBC, IRON, RETICCTPCT in the last 72 hours. Sepsis Labs: No results for input(s): PROCALCITON, LATICACIDVEN in the last 168 hours.  No results found for this or any previous visit (from the past 240 hour(s)).       Radiology Studies: CT ABDOMEN WO CONTRAST  Result Date: 10/04/2020 CLINICAL DATA:  Anatomy for G-tube placement. EXAM: CT ABDOMEN WITHOUT CONTRAST TECHNIQUE: Multidetector CT imaging of the abdomen was performed following the standard protocol without IV contrast. COMPARISON:  10/11/2007 FINDINGS: Lower chest: Coarse ground-glass airspace opacities are noted at the lung bases bilaterally. There is significant architectural distortion.The heart is enlarged. Hepatobiliary: There is a small cyst in the left hepatic lobe. Status post cholecystectomy.There is no biliary ductal dilation. Pancreas: Normal  contours without ductal dilatation. No peripancreatic fluid collection. Spleen: Unremarkable. Adrenals/Urinary Tract: --Adrenal glands: Unremarkable. --Right kidney/ureter: No hydronephrosis or radiopaque kidney stones. --Left kidney/ureter: No hydronephrosis or radiopaque kidney stones. Stomach/Bowel: --Stomach/Duodenum: Significant portions of the gastric body are posterior to the left hepatic lobe. There is narrowing of a potential percutaneous window by the transverse colon. The enteric tube terminates in the region of the pylorus/duodenal bulb. There is a small hiatal hernia. --Small bowel: The visualized small bowel is unremarkable. --Colon: Contrast is noted within the transverse  colon. The transverse colon is in close proximity to significant portions of the gastric body. --Appendix: Outside the field of view. Vascular/Lymphatic: Atherosclerotic changes are noted. --there is no significant retroperitoneal adenopathy. --there is no significant mesenteric adenopathy. Other: No ascites or free air. Musculoskeletal. There is a subtle age-indeterminate compression fracture of the T11 superior endplate (sagittal series 7, image 60). There is osteopenia. There is partially visualized posterior fusion hardware involving the lumbar spine. IMPRESSION: 1. Anatomy as detailed above. 2. Coarse ground-glass airspace opacities at the lung bases with significant architectural distortion is consistent with the patient's history of viral pneumonia. 3. Age-indeterminate subtle compression fracture of the superior endplate of the Y70 vertebral body. Correlation with point tenderness is recommended. 4. Additional chronic findings as detailed above. 5.  Aortic Atherosclerosis (ICD10-I70.0). Electronically Signed   By: Constance Holster M.D.   On: 10/04/2020 19:27        Scheduled Meds: . acidophilus  1 capsule Oral Daily  . albuterol  2 puff Inhalation BID  . aspirin  325 mg Per Tube Daily  . atorvastatin  10 mg Per Tube Daily  . clopidogrel  75 mg Per Tube Daily  . enoxaparin (LOVENOX) injection  40 mg Subcutaneous Q24H  . famotidine  20 mg Per Tube Daily  . feeding supplement (ENSURE ENLIVE)  237 mL Oral TID BM  . feeding supplement (OSMOLITE 1.5 CAL)  1,000 mL Per Tube Q24H  . feeding supplement (PROSource TF)  45 mL Per Tube BID  . free water  100 mL Per Tube Q6H  . influenza vaccine adjuvanted  0.5 mL Intramuscular Tomorrow-1000  . lamoTRIgine  100 mg Per Tube Daily  . levothyroxine  100 mcg Per Tube Q0600  . mouth rinse  15 mL Mouth Rinse BID  . metoprolol tartrate  25 mg Per Tube BID  . sertraline  100 mg Oral BID  . sodium chloride flush  10-40 mL Intracatheter Q12H  . sodium  chloride flush  3 mL Intravenous Q12H   Continuous Infusions: . sodium chloride Stopped (09/15/20 0004)  . sodium chloride Stopped (09/14/20 2233)     LOS: 22 days  Georgette Shell, MD 10/06/2020, 9:50 AM

## 2020-10-06 NOTE — Plan of Care (Signed)
  Problem: Education: Goal: Knowledge of risk factors and measures for prevention of condition will improve Outcome: Progressing   Problem: Respiratory: Goal: Will maintain a patent airway Outcome: Progressing   Problem: Activity: Goal: Risk for activity intolerance will decrease Outcome: Progressing

## 2020-10-07 ENCOUNTER — Inpatient Hospital Stay (HOSPITAL_COMMUNITY): Payer: Medicare HMO

## 2020-10-07 DIAGNOSIS — J1282 Pneumonia due to Coronavirus disease 2019: Secondary | ICD-10-CM | POA: Diagnosis not present

## 2020-10-07 DIAGNOSIS — U071 COVID-19: Secondary | ICD-10-CM | POA: Diagnosis not present

## 2020-10-07 LAB — BLOOD GAS, ARTERIAL
Acid-base deficit: 1.5 mmol/L (ref 0.0–2.0)
Bicarbonate: 22.9 mmol/L (ref 20.0–28.0)
FIO2: 80
O2 Saturation: 86.4 %
Patient temperature: 37
pCO2 arterial: 40 mmHg (ref 32.0–48.0)
pH, Arterial: 7.376 (ref 7.350–7.450)
pO2, Arterial: 56 mmHg — ABNORMAL LOW (ref 83.0–108.0)

## 2020-10-07 LAB — COMPREHENSIVE METABOLIC PANEL
ALT: 68 U/L — ABNORMAL HIGH (ref 0–44)
AST: 40 U/L (ref 15–41)
Albumin: 2.5 g/dL — ABNORMAL LOW (ref 3.5–5.0)
Alkaline Phosphatase: 82 U/L (ref 38–126)
Anion gap: 11 (ref 5–15)
BUN: 21 mg/dL (ref 8–23)
CO2: 25 mmol/L (ref 22–32)
Calcium: 9.4 mg/dL (ref 8.9–10.3)
Chloride: 98 mmol/L (ref 98–111)
Creatinine, Ser: 0.71 mg/dL (ref 0.44–1.00)
GFR, Estimated: >60 mL/min (ref >60)
Glucose, Bld: 125 mg/dL — ABNORMAL HIGH (ref 70–99)
Potassium: 4.7 mmol/L (ref 3.5–5.1)
Sodium: 134 mmol/L — ABNORMAL LOW (ref 135–145)
Total Bilirubin: 0.3 mg/dL (ref 0.3–1.2)
Total Protein: 7.1 g/dL (ref 6.5–8.1)

## 2020-10-07 LAB — CBC
HCT: 37.4 % (ref 36.0–46.0)
Hemoglobin: 11.9 g/dL — ABNORMAL LOW (ref 12.0–15.0)
MCH: 29.7 pg (ref 26.0–34.0)
MCHC: 31.8 g/dL (ref 30.0–36.0)
MCV: 93.3 fL (ref 80.0–100.0)
Platelets: 286 10*3/uL (ref 150–400)
RBC: 4.01 MIL/uL (ref 3.87–5.11)
RDW: 15 % (ref 11.5–15.5)
WBC: 17.1 10*3/uL — ABNORMAL HIGH (ref 4.0–10.5)
nRBC: 0 % (ref 0.0–0.2)

## 2020-10-07 LAB — GLUCOSE, CAPILLARY
Glucose-Capillary: 175 mg/dL — ABNORMAL HIGH (ref 70–99)
Glucose-Capillary: 146 mg/dL — ABNORMAL HIGH (ref 70–99)
Glucose-Capillary: 162 mg/dL — ABNORMAL HIGH (ref 70–99)

## 2020-10-07 MED ORDER — MORPHINE SULFATE (PF) 2 MG/ML IV SOLN
2.0000 mg | INTRAVENOUS | Status: DC | PRN
  Administered 2020-10-07: 2 mg via INTRAVENOUS

## 2020-10-07 MED ORDER — LORAZEPAM 2 MG/ML IJ SOLN
INTRAMUSCULAR | Status: AC
  Filled 2020-10-07: qty 1

## 2020-10-07 MED ORDER — LORAZEPAM 2 MG/ML IJ SOLN
1.0000 mg | INTRAMUSCULAR | Status: DC | PRN
  Administered 2020-10-07: 2 mg via INTRAVENOUS

## 2020-10-07 MED ORDER — MORPHINE SULFATE (PF) 2 MG/ML IV SOLN
1.0000 mg | INTRAVENOUS | Status: DC | PRN
  Administered 2020-10-07 (×4): 1 mg via INTRAVENOUS
  Filled 2020-10-07 (×3): qty 1

## 2020-10-07 MED ORDER — LORAZEPAM 2 MG/ML IJ SOLN
1.0000 mg | INTRAMUSCULAR | Status: DC | PRN
  Administered 2020-10-07: 1 mg via INTRAVENOUS
  Filled 2020-10-07: qty 1

## 2020-10-07 MED ORDER — MORPHINE SULFATE (PF) 2 MG/ML IV SOLN: 1.0000 mg | Freq: Once | INTRAVENOUS | Status: AC

## 2020-10-17 ENCOUNTER — Ambulatory Visit: Payer: Medicare HMO | Admitting: Family Medicine

## 2020-10-28 NOTE — Progress Notes (Signed)
Calorie Count Note  48 hour calorie count ordered.  Diet: Dysphagia 1 with Nectar Thick liquids Supplements: Magic Cup TID, Ensure Enlive TID   Day 1 Results (10/05/20) Breakfast: 85 kcals, 2 grams protein Lunch: 492 kcals, 22 grams protein Dinner: 290 kcals, 9 grams protein Supplements: no documentation available regarding Ensure consumption. Magic cup consumption included in meal totals.   Total intake: 867 kcal (43% of minimum estimated needs)  33 protein (31% of minimum estimated needs)  Day 2 Results (10/06/20) Breakfast: 0 kcals, 0 grams protein Lunch: 0 kcals, 0 grams protein Dinner: 0 kcals, 0 grams protein Supplements: no documentation available regarding Ensure consumption. Magic cup consumption included in meal totals.   Total intake: 0 kcal (0% of minimum estimated needs)  0 protein (0% of minimum estimated needs)  Day 3 Results (2020-10-09) Breakfast: 0 kcals, 0 grams protein Lunch: 97 kcals, 4 grams protein Dinner: pt has not yet received  Supplements: no documentation available regarding Ensure consumption. Magic cup consumption included in meal totals.   Total intake: 97 kcal (4% of minimum estimated needs)  4 protein (3% of minimum estimated needs)   Nutrition Dx: Increased nutrient needs related to acute illness (COVID-19) as evidenced by estimated needs.  Goal: Patient will meet greater than or equal to 90% of their needs  Intervention: -Continue Magic cup TID with meals, each supplement provides 290 kcal and 9 grams of protein -Continue Ensure Enlive po TID, each supplement provides 350 kcal and 20 grams of protein -Continue Nocturnal TF via Cortrak:Nocturnal x 14 hours (1800 to 0800) Osmolite 1.5 at 70 ml/hr x 14 hours with Pro-Source 45 mL BID. This Provides  83 g of protein, 1550 kcals. Meets 75% calorie and protein needs.  Recommend proceeding with G-tube placement as pt is not eating enough to meet his needs.   Larkin Ina, MS, RD, LDN RD  pager number and weekend/on-call pager number located in [place].

## 2020-10-28 NOTE — Care Management Important Message (Signed)
Important Message  Patient Details  Name: April Brewer MRN: [id number] Date of Birth: 26-Nov-1946   Medicare Important Message Given:  Yes - Important Message mailed due to current National Emergency  Verbal consent obtained due to current National Emergency  Relationship to patient: Self Contact Name: April Brewer Call Date: 2020-11-06  Time: 1208 Phone: [phone] Outcome: No Answer/Busy Important Message mailed to: Patient address on file    Delorse Lek 11/06/20, 12:08 PM

## 2020-10-28 NOTE — Progress Notes (Signed)
Per MD. Patient taken off BIPAP and placed back on NRB. Patient will transition to comfort care.

## 2020-10-28 NOTE — Progress Notes (Signed)
  Speech Language Pathology Treatment: Dysphagia  Patient Details Name: April Brewer MRN: [id number] DOB: Feb 09, 1946 Today's Date: 2020-10-24 Time: 5329-9242 SLP Time Calculation (min) (ACUTE ONLY): 20 min  Assessment / Plan / Recommendation Clinical Impression  Pt was seen for dysphagia tx after being re-ordered by MD given concern for coughing after POs. Oral care was completed by the SLP, and she was observed to have a copious amount of thick, dried secretions in the oral cavity. After completing oral care, pt was given ice chips, nectar-thick liquid, and ice cream. She continues to demonstrate oral holding, an audible swallow, and suspected delayed swallow initiation. However, oral holding was observed to be less significant as compared to previous sessions. No s/sx of aspiration were observed. Recommend pt have thorough oral care prior to all meals and be given occasional ice chips throughout the day. SLP will continue to f/u acutely.    HPI HPI: Patient is a 74 y.o. female with PMHx of HLD, hypothyroidism, GERD, lymphocytic colitis, anxiety/depression, chronic back pain on narcotics-presented with several days history of shortness of breath-found to have acute hypoxic respiratory failure secondary to COVID-19 pneumonia.  Patient started to develop some lethargy 9/24, progressive, with no improvement despite holding Xanax and oxycodone, MRI brain significant for acute CVA 9/26 .desat to 83 on oxygen. 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        SLP Plan  Continue with current plan of care       Recommendations  Diet recommendations: Dysphagia 1 (puree);Nectar-thick liquid Liquids provided via: Straw Medication Administration: Via alternative means Supervision: Full supervision/cueing for compensatory strategies Compensations: Slow rate;Small sips/bites Postural Changes and/or Swallow Maneuvers:  Seated upright 90 degrees                Oral Care Recommendations: Oral care QID;Staff/trained caregiver to provide oral care;Oral care prior to ice chip/H20 SLP Visit Diagnosis: Dysphagia, oropharyngeal phase (R13.12) Plan: Continue with current plan of care       GO                Greggory Keen 10/24/2020, 12:03 PM

## 2020-10-28 NOTE — Progress Notes (Signed)
RN went into patients room and noticed pt had increased work of breathing and was sating in the low 80s. RN assisted pt back to bed. Pt continued to have increased work of breathing. Administered PRN medications and encouraged use of flutter valve and deep breathing. Pt unable to do either. Respiratory rate was in the 40s with HR in the 140s-150s. RN called [facility], Respiratory and made MD aware. Pt now on comfort care. MD called son and son came to bedside. Pt's cortrak removed per MD, PRN medications administered.

## 2020-10-28 NOTE — Progress Notes (Addendum)
Upon entering patient's room patient DNR, unresponsive, no respirations, no pulse at 2130 2020/10/11, RN's verified.  Patient's son April Brewer notified of patient's death, unsure of funeral home placement at this time.  On-call MD notified  [place] donor services notified  Patient's belongings placed at [facility] front desk

## 2020-10-28 NOTE — Plan of Care (Signed)
  Problem: Education: Goal: Knowledge of risk factors and measures for prevention of condition will improve Outcome: Progressing   Problem: Clinical Measurements: Goal: Respiratory complications will improve Outcome: Progressing   Problem: Activity: Goal: Risk for activity intolerance will decrease Outcome: Progressing   Problem: Nutrition: Goal: Adequate nutrition will be maintained Outcome: Progressing

## 2020-10-28 NOTE — Plan of Care (Signed)
?  Problem: Clinical Measurements: ?Goal: Respiratory complications will improve ?Outcome: Not Progressing ?  ?Problem: Activity: ?Goal: Risk for activity intolerance will decrease ?Outcome: Not Progressing ?  ?Problem: Nutrition: ?Goal: Adequate nutrition will be maintained ?Outcome: Not Progressing ?  ?

## 2020-10-28 NOTE — Progress Notes (Signed)
After changing patient, increase respirations due to activity albuterol inhaler given to help with breathing.  Patient having difficulty swallowing and cough noted after attempted to drink water (Nectar thick). Repeat swallow eval ordered.   Patient rested in bed, stating she feels fine, stating O2 90%

## 2020-10-28 NOTE — Progress Notes (Signed)
Called by RN that patient had increased WOB and desating. Was placed on NRB by RN from previous 3 LPM. RN also had given patients inhaler. Upon arrival patient was very labored, unable to speak name. Increased RR. On 15 LPM NRB. [facility] also in the room. ABG was order and gotten. Sent to lab. Per MD started on BIPAP to see if patient would tolerate. Waiting for ABG results.

## 2020-10-28 NOTE — Progress Notes (Addendum)
PROGRESS NOTE    KELLA SPLINTER  IZT:[id number] DOB: [birth year] DOA: 09/09/2020 PCP: Tammi Sou, MD  Brief Narrative: 74 year old female admitted with COVID-19 pneumonia she received COVID-19 treatment came off isolation 10/04/2020.  She has history of hyperlipidemia hypothyroidism GERD lymphocytic colitis anxiety depression chronic back pain.  Patient developed acute stroke on 09/22/2020 MRI positive for acute stroke.  Patient was unvaccinated for Covid.  Patient received steroids remdesivir and baricitinib.  9/17>>Chest x-ray: Diffuse interstitial and groundglass opacities 9/26>> MRI brain: Multifocal acute infarcts in the posterior circulation. 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    She does not appear to be in any distress.  She was initially on 15 L of high flow nasal cannula.  She finished a course of remdesivir however baricitinib had to be held due to dysphagia secondary to acute stroke.  Inflammatory markers have improved.  She came off isolation 10/04/2020.  Patient had an episode of hypoxia this morning immediately after trying to drink nectar thick she started coughing and had difficulty swallowing.  Patient waiting to have PEG tube placed most likely 10/11/2020.(Plavix had to be held for 5 days before the PEG, Plavix was held starting 10/06/2020.)  #2 acute ischemic  stroke likely secondary to hypercoagulable state associated with COVID-19.  Patient is currently getting feeds through CorPak tube.  Followed by PT OT and speech therapy.  Echo with normal ejection fraction no embolic source seen.   MRI of the brain 09/22/2020 multifocal acute infarcts in the posterior circulation. Was followed by neurology patient will need to be on aspirin and Plavix for 3 months.  #3 dysphagia secondary to acute stroke patient now has a core track tube through which she gets tube feeds.  IR team was consulted for PEG tube placement.  They recommended CT of the abdomen for evaluation of anatomy for percutaneous candidacy.  Will have to hold Plavix for 5 days for IR to do a PEG tube. Plavix stopped on 10/06/2020 Please restart Plavix after PEG tube is placed  #9 hyponatremia/hypernatremia/hypokalemia-sodium 133 potassium 4.9.  Will recheck labs again today.  #10 transaminitis resolved  #11 hyperlipidemia continue statin  #12 anxiety and depression on Zoloft and lamotrigine  #13 hypothyroidism normal TSH continue Synthroid  #14 goals of care patient is DO NOT [place] care.patient went into respiratory distress stat chest xray with multifocal pneumonia likely aspiration,abg not hypercarbic.7.38 hypoxic.bipap attempted.no success.dw son [name].requested to change patient to comfort care.expecting hospital death.     Nutrition Problem: Increased nutrient needs Etiology: acute illness (COVID-19)     Signs/Symptoms: estimated needs    Interventions: Tube feeding  Estimated body mass index is 23.12 kg/m as calculated from the following:   Height as of this encounter: 5\' 4"  (1.626 m).   Weight as of this encounter: 61.1 kg.  DVT prophylaxis: Lovenox Code Status: DNR  family Communication: Updated her son  Thomas over the phone on 10-21-20  disposition Plan:  Status is: Inpatient  Dispo:  Patient From: Home  Planned Disposition: [facility]  Expected discharge date: 10/08/20  Medically stable for discharge: No   Consultants:   Neurology, interventional radiology  Procedures: None Antimicrobials: None  Subjective:patient resting in bed in no acute distress Patient had an episode of choking after drinking thickened liquids early this morning. Objective: Vitals:   10/06/20 1914 10/06/20 2129 10-21-20 0500 10/21/20 0728  BP: 108/78 110/72 (!) 134/97 136/87  Pulse: (!) 102 99 99 (!) 102  Resp: 16   18  Temp: 98 F (36.7 C)  98.7 F (37.1 C) 98.5 F (36.9 C)  TempSrc: Oral  Oral Oral  SpO2: 93%  97% 95%  Weight:      Height:        Intake/Output Summary (Last 24 hours) at 21-Oct-2020 1205 Last data filed at 10-21-2020 0915 Gross per 24 hour  Intake 120 ml  Output 1100 ml  Net -980 ml   Filed Weights   10/02/20 0420 10/03/20 0404 10/04/20 0500  Weight: 64.3 kg 64.7 kg 61.1 kg    Examination:  General exam: Appears calm and comfortable  Respiratory system: Scattered rhonchi bilaterally to auscultation. Respiratory effort normal. Cardiovascular system: S1 & S2 heard, RRR. No JVD, murmurs, rubs, gallops or clicks. No pedal edema. Gastrointestinal system: Abdomen is nondistended, soft and nontender. No organomegaly or masses felt. Normal bowel sounds heard. Central nervous system: Awake oriented moves all extremities  extremities: No edema Skin: No rashes, lesions or ulcers Psychiatry: Unable to assess    Data Reviewed: I have personally reviewed following labs and imaging studies  CBC: Recent Labs  Lab 10/04/20 0541  WBC 14.0*  HGB 12.2  HCT 37.6  MCV 91.7  PLT 315   Basic Metabolic Panel: Recent Labs  Lab 10/01/20 0448 10/02/20 0440 10/03/20 0246 10/04/20 0541  NA 132* 133* 131* 133*  K 4.5 4.6 4.5 4.9  CL 99 99 97* 97*  CO2 23 24 24 24   GLUCOSE 111* 98 127* 119*  BUN 15 16 19 15   CREATININE 0.67 0.67 0.63 0.75  CALCIUM 8.5* 8.8* 8.7* 9.2  MG 2.0  --   --   --     GFR: Estimated Creatinine Clearance: 52.5 mL/min (by C-G formula based on SCr of 0.75 mg/dL). Liver Function Tests: No results for input(s): AST, ALT, ALKPHOS, BILITOT, PROT, ALBUMIN in the last 168 hours. No results for input(s): LIPASE, AMYLASE in the last 168 hours. No results for input(s): AMMONIA in the last 168 hours. Coagulation Profile: No results for input(s): INR, PROTIME in the last 168 hours. Cardiac Enzymes: No results for input(s): CKTOTAL, CKMB, CKMBINDEX, TROPONINI in the last 168 hours. BNP (last 3 results) No results for input(s): PROBNP in the last 8760 hours. HbA1C: No results for input(s): HGBA1C in the last 72 hours. CBG: Recent Labs  Lab 10/06/20 1126 10/06/20 1631 10/06/20 2025 2020/10/21 0645 10/21/2020 1121  GLUCAP 114* 112* 168* 175* 146*   Lipid Profile: No results for input(s): CHOL, HDL, LDLCALC, TRIG, CHOLHDL, LDLDIRECT in the last 72 hours. Thyroid Function Tests: No results for input(s): TSH, T4TOTAL, FREET4, T3FREE, THYROIDAB in the last 72 hours. Anemia Panel: No results for input(s): VITAMINB12, FOLATE, FERRITIN, TIBC, IRON, RETICCTPCT in the last 72 hours. Sepsis Labs: No results for input(s): PROCALCITON, LATICACIDVEN in the last 168 hours.  No results found for this or any previous visit (from the past  240 hour(s)).       Radiology Studies: No results found.      Scheduled Meds: . acidophilus  1 capsule Oral Daily  . aspirin  325 mg Per Tube Daily  . atorvastatin  10 mg Per Tube Daily  . enoxaparin (LOVENOX) injection  40 mg Subcutaneous Q24H  . famotidine  20 mg Per Tube Daily  . feeding supplement (ENSURE ENLIVE)  237 mL Oral TID BM  . feeding supplement (OSMOLITE 1.5 CAL)  1,000 mL Per Tube Q24H  . feeding supplement (PROSource TF)  45 mL Per Tube BID  . free water  100 mL Per Tube Q6H  . influenza vaccine adjuvanted  0.5 mL Intramuscular Tomorrow-1000  . lamoTRIgine  100 mg Per Tube Daily  . levothyroxine  100 mcg  Per Tube Q0600  . mouth rinse  15 mL Mouth Rinse BID  . metoprolol tartrate  25 mg Per Tube BID  . sertraline  100 mg Oral BID  . sodium chloride flush  10-40 mL Intracatheter Q12H  . sodium chloride flush  3 mL Intravenous Q12H   Continuous Infusions: . sodium chloride Stopped (09/15/20 0004)  . sodium chloride Stopped (09/14/20 2233)     LOS: 23 days  Georgette Shell, MD October 17, 2020, 12:05 PM

## 2020-10-28 NOTE — Plan of Care (Signed)
  Problem: Respiratory: Goal: Will maintain a patent airway Outcome: Completed/Met   Problem: Clinical Measurements: Goal: Respiratory complications will improve Outcome: Progressing   Problem: Activity: Goal: Risk for activity intolerance will decrease Outcome: Progressing   Problem: Nutrition: Goal: Adequate nutrition will be maintained Outcome: Not Progressing

## 2020-10-28 NOTE — Death Summary Note (Signed)
Death Summary  April Brewer QQP:[id number] DOB: 1946-07-06 DOA: 2020-09-28  PCP: Tammi Sou, MD  Admit date: 28-Sep-2020 Date of Death: 10/22/20. Time of Death: 04-03-29 on October 22, 2020 Notification: Tammi Sou, MD notified of death of 23-Oct-2020   History of present illness:  April Brewer is a 74 y.o. female with a history of hypothyroidism, anxiety depression, hyperlipidemia admitted with [id number] pneumonia complicated with acute stroke during the hospital stay.  She was treated with remdesivir and Decadron.  [place] neb had to be stopped due to dysphagia from acute stroke.  Patient became progressively hypoxic on 2020-10-22 after trying to eat or drink.  It is suspected that she passed away with aspiration pneumonia.  She was waiting to have a PEG tube placed for feeding.  She had a cortrack tach tube for feeding.  She was DNR.  Since she did not make any improvement she was made comfort care after long discussion with her son [name] who told me that he does not want his mother to struggle anymore and to be kept comfortable.  She was started on morphine and Ativan.  They at 04/03/29 on Oct 22, 2020. Final Diagnoses:  1.   Covid pneumonia/aspiration pneumonia 2 acute stroke   The results of significant diagnostics from this hospitalization (including imaging, microbiology, ancillary and laboratory) are listed below for reference.    Significant Diagnostic Studies: CT ABDOMEN WO CONTRAST  Result Date: 10/04/2020 CLINICAL DATA:  Anatomy for G-tube placement. EXAM: CT ABDOMEN WITHOUT CONTRAST TECHNIQUE: Multidetector CT imaging of the abdomen was performed following the standard protocol without IV contrast. COMPARISON:  10/11/2007 FINDINGS: Lower chest: Coarse ground-glass airspace opacities are noted at the lung bases bilaterally. There is significant architectural distortion.The heart is enlarged. Hepatobiliary: There is a small cyst in the left hepatic lobe. Status post  cholecystectomy.There is no biliary ductal dilation. Pancreas: Normal contours without ductal dilatation. No peripancreatic fluid collection. Spleen: Unremarkable. Adrenals/Urinary Tract: --Adrenal glands: Unremarkable. --Right kidney/ureter: No hydronephrosis or radiopaque kidney stones. --Left kidney/ureter: No hydronephrosis or radiopaque kidney stones. Stomach/Bowel: --Stomach/Duodenum: Significant portions of the gastric body are posterior to the left hepatic lobe. There is narrowing of a potential percutaneous window by the transverse colon. The enteric tube terminates in the region of the pylorus/duodenal bulb. There is a small hiatal hernia. --Small bowel: The visualized small bowel is unremarkable. --Colon: Contrast is noted within the transverse colon. The transverse colon is in close proximity to significant portions of the gastric body. --Appendix: Outside the field of view. Vascular/Lymphatic: Atherosclerotic changes are noted. --there is no significant retroperitoneal adenopathy. --there is no significant mesenteric adenopathy. Other: No ascites or free air. Musculoskeletal. There is a subtle age-indeterminate compression fracture of the T11 superior endplate (sagittal series 7, image 60). There is osteopenia. There is partially visualized posterior fusion hardware involving the lumbar spine. IMPRESSION: 1. Anatomy as detailed above. 2. Coarse ground-glass airspace opacities at the lung bases with significant architectural distortion is consistent with the patient's history of viral pneumonia. 3. Age-indeterminate subtle compression fracture of the superior endplate of the D98 vertebral body. Correlation with point tenderness is recommended. 4. Additional chronic findings as detailed above. 5.  Aortic Atherosclerosis (ICD10-I70.0). Electronically Signed   By: Constance Holster M.D.   On: 10/04/2020 19:27   CT ANGIO HEAD W OR WO CONTRAST  Result Date: 09/24/2020 CLINICAL DATA:  Posterior  circulation stroke.  COVID positive. EXAM: CT ANGIOGRAPHY HEAD AND NECK TECHNIQUE: Multidetector CT imaging of the head and neck was  performed using the standard protocol during bolus administration of intravenous contrast. Multiplanar CT image reconstructions and MIPs were obtained to evaluate the vascular anatomy. Carotid stenosis measurements (when applicable) are obtained utilizing NASCET criteria, using the distal internal carotid diameter as the denominator. CONTRAST:  33mL OMNIPAQUE IOHEXOL 350 MG/ML SOLN COMPARISON:  MRI 09/22/2020 FINDINGS: CTA NECK FINDINGS Aortic arch: Visualized portions demonstrate atherosclerosis without significant stenosis or aneurysm. Right carotid system: No evidence of dissection, stenosis (50% or greater) or occlusion. Left carotid system: No evidence of dissection, stenosis (50% or greater) or occlusion. Vertebral arteries: Right dominant. There is abrupt occlusion of the left vertebral artery at the level of C2. Reconstitution immediately before the vertebral artery courses intradural E, which may be from retrograde flow and/or collaterals. Skeleton: Multilevel severe degenerative disc disease in the cervical spine. Other neck: No mass lesion or adenopathy. Upper chest: Peripheral predominant ground-glass opacities in the imaged lung apices, compatible with COVID pneumonia. Review of the MIP images confirms the above findings CTA HEAD FINDINGS Anterior circulation: No significant stenosis, proximal occlusion, aneurysm, or vascular malformation. Posterior circulation: Abrupt inclusion of the distal right P2 PCA (series 5, image 448). a Venous sinuses: As permitted by contrast timing, patent. Additional comments: Partially imaged edema associated with multifocal posterior circulation infarcts that were better characterized on recent MRI. Review of the MIP images confirms the above findings IMPRESSION: 1. 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 2. Partially imaged edema associated with multifocal posterior circulation infarcts that were better characterized on recent MRI. Non-contrast head CT could further evaluate the degree of edema/mass effect if clinically indicated. 3. Peripheral predominant ground-glass opacities in the imaged lung apices, compatible with COVID pneumonia given the clinical history. These results will be called to the ordering clinician or representative by the Radiologist Assistant, and communication documented in the PACS or [employer]. Electronically Signed   By: Margaretha Sheffield MD   On: 09/24/2020 09:45   CT ANGIO NECK W OR WO CONTRAST  Result Date: 09/24/2020 CLINICAL DATA:  Posterior circulation stroke.  COVID positive. EXAM: CT ANGIOGRAPHY HEAD AND NECK TECHNIQUE: Multidetector CT imaging of the head and neck was performed using the standard protocol during bolus administration of intravenous contrast. Multiplanar CT image reconstructions and MIPs were obtained to evaluate the vascular anatomy. Carotid stenosis measurements (when applicable) are obtained utilizing NASCET criteria, using the distal internal carotid diameter as the denominator. CONTRAST:  39mL OMNIPAQUE IOHEXOL 350 MG/ML SOLN COMPARISON:  MRI 09/22/2020 FINDINGS: CTA NECK FINDINGS Aortic arch: Visualized portions demonstrate atherosclerosis without significant stenosis or aneurysm. Right carotid system: No evidence of dissection, stenosis (50% or greater) or occlusion. Left carotid system: No evidence of dissection, stenosis (50% or greater) or occlusion. Vertebral arteries: Right dominant. There is abrupt occlusion of the left vertebral artery at the level of C2. Reconstitution immediately before the vertebral artery courses intradural E, which may be from retrograde flow and/or collaterals. Skeleton: Multilevel severe degenerative disc disease in the cervical spine. Other neck: No mass lesion or adenopathy. Upper  chest: Peripheral predominant ground-glass opacities in the imaged lung apices, compatible with COVID pneumonia. Review of the MIP images confirms the above findings CTA HEAD FINDINGS Anterior circulation: No significant stenosis, proximal occlusion, aneurysm, or vascular malformation. Posterior circulation: Abrupt inclusion of the distal right P2 PCA (series 5, image 448). a Venous sinuses: As permitted by contrast timing, patent. Additional comments: Partially imaged edema associated  with multifocal posterior circulation infarcts that were better characterized on recent MRI. Review of the MIP images confirms the above findings IMPRESSION: 1. 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 2. Partially imaged edema associated with multifocal posterior circulation infarcts that were better characterized on recent MRI. Non-contrast head CT could further evaluate the degree of edema/mass effect if clinically indicated. 3. Peripheral predominant ground-glass opacities in the imaged lung apices, compatible with COVID pneumonia given the clinical history. These results will be called to the ordering clinician or representative by the Radiologist Assistant, and communication documented in the PACS or [employer]. Electronically Signed   By: Margaretha Sheffield MD   On: 09/24/2020 09:45   MR BRAIN WO CONTRAST  Result Date: 09/22/2020 CLINICAL DATA:  Delirium.  COVID positive EXAM: MRI HEAD WITHOUT CONTRAST TECHNIQUE: Multiplanar, multiecho pulse sequences of the brain and surrounding structures were obtained without intravenous contrast. COMPARISON:  None. FINDINGS: Brain: Extensive acute infarction in the posterior circulation, patchily present throughout the left more than right cerebellum with right cerebellar involvement mainly at the superior cerebellar distribution and left-sided involvement both at the PICA and superior cerebellar arteries. Right more  than left PCA involvement involving the bilateral thalamus and right more than left occipital cortex. Right more than left hippocampal region involvement. No anterior circulation infarct is seen. No hemorrhage, hydrocephalus, or masslike finding. Mild chronic small vessel disease. Cerebral volume loss. Vascular: A right vertebral and basilar flow void or visible. The left vertebral artery could be hypoplastic or diseased. Skull and upper cervical spine: Normal marrow signal. Cervical spine degeneration with C3-4 and C4-5 anterolisthesis Sinuses/Orbits: Bilateral cataract resection. Nasal septal perforation. Other: Progressively motion degraded study ASAP these results will be called to the ordering clinician or representative by the Radiologist Assistant, and communication documented in the PACS or [employer]. 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 Electronically Signed   By: Monte Fantasia M.D.   On: 09/22/2020 11:45   DG CHEST PORT 1 VIEW  Result Date: 10-09-2020 CLINICAL DATA:  74 year old female with respiratory distress. EXAM: PORTABLE CHEST 1 VIEW COMPARISON:  Chest radiograph dated 09/02/2020. FINDINGS: Feeding tube with tip in the distal stomach. Bilateral pulmonary opacities with peripheral and subpleural distribution progressed since the prior radiograph most consistent with multifocal pneumonia, likely viral or atypical in etiology including COVID-19. Clinical correlation is recommended. No pleural effusion or pneumothorax. Mild cardiomegaly. Osteopenia with degenerative changes of the spine. No acute osseous pathology. Bilateral shoulder arthroplasties. IMPRESSION: Multifocal pneumonia, progressed since the prior radiograph. Clinical correlation and follow-up recommended. Electronically Signed   By: Anner Crete M.D.   On: 09-Oct-2020 16:24   DG Chest Portable 1 View  Result Date: 09/14/2020 CLINICAL DATA:  Cough, congestion, fever, loss of  taste and smell for 1 week EXAM: PORTABLE CHEST 1 VIEW COMPARISON:  01/11/2014 FINDINGS: Single frontal view of the chest demonstrates stable enlarged cardiac silhouette. There is diffuse increased interstitial prominence with bilateral ground-glass consolidation. No effusion or pneumothorax. No acute bony abnormalities. Bilateral shoulder arthroplasties. IMPRESSION: 1. Diffuse interstitial and ground-glass opacities, consistent with bilateral atypical pneumonia given clinical history. Electronically Signed   By: Randa Ngo M.D.   On: 09/20/2020 23:33   DG Swallowing Func-Speech Pathology  Result Date: 10/02/2020 Completed and documented by Greggory Keen, SLP student Supervised and reviewed by Herbie Baltimore MA CCC-SLP Objective Swallowing Evaluation: Type of Study: MBS-Modified Barium Swallow Study  Patient Details Name: April Brewer MRN: [id number] Date of Birth: 08-May-1946 Today's Date: 10/02/2020 Time: SLP Start Time (ACUTE ONLY): 1400 -SLP Stop Time (ACUTE ONLY): 1415 SLP Time Calculation (min) (ACUTE ONLY): 15 min Past Medical History: Past Medical History: Diagnosis Date  ALLERGIC RHINITIS 06/02/2007  Allergy   Arthritis   DDD of cervical, thoracic, and lumbar spine  Blood transfusion without reported diagnosis   Cataract   Bilateral removed cateracts  Cervical spondylosis   C5-6 and C6-7.  Pain mgmt as per Dr. Nelva Bush.  Chronic gastritis without bleeding   Chronic low back pain 02/18/2009  s/p fusion L3-4 through L5-S1.  Pain meds per Dr. Nelva Bush  Chronic renal insufficiency, stage 3 (moderate)   borderline II/III (GFR about 55 ml/min) as of 2021  DEPRESSION 06/02/2007  Diverticulosis   GERD (gastroesophageal reflux disease)   Heart murmur   Hiatal hernia   Hypercholesterolemia   HYPOTHYROIDISM 06/02/2007  GOITER  IBS (irritable bowel syndrome)   INSOMNIA 06/02/2007  Lymphocytic colitis   Muscle spasms of lower extremity   Nasal septal perforation   chronic; hx of epistaxis  Normocytic anemia  04/2015  HEME + in ED 08/07/15--endoscopic eval unrevealing.  Iron studies fine-->?Anemia of chronic dz (lymphocytic colitis?).  Hb 02/2020 11.5.  OSTEOPENIA 02/18/2009  Repeat DEXA 07/2014 showed osteoporosis by T score in radius but spine and hip T scores were normal-continue vit D and calcium and repeat DEXA 08/2016 unchanged.  Repeat DEXA 2 yrs.  Peripheral edema   LE's, nonpitting  Renal cyst, acquired, right   1.2 cm, 2021  Simple hepatic cyst   x 1. 2021  Trochanteric bursitis of both hips   inj 08/11/20 Past Surgical History: Past Surgical History: Procedure Laterality Date  ABDOMINAL HYSTERECTOMY    for DUB (ovaries are still in)  BACK SURGERY    X 5: fusion of L3-L4 through L5-S1  CATARACT EXTRACTION Bilateral   CHOLECYSTECTOMY  2012  COLONOSCOPY  06/2007; 01/2015  2008 Diverticulosis, o/w normal.  2016 showed lymphocytic colitis with mild diverticulosis: pt was started on oral budesonide at that time.  CT SCAN  09/03/2017  ABDOMEN, HEAD  DEXA  08/16/2014; 09/14/16  Hip and spine ok; radius osteoporotic---but meds not indicated.  Repeat 08/2018  ESOPHAGOGASTRODUODENOSCOPY  10/2007; 11/27/15; 04/26/20  03/2020: chronic gastritis, H pylori neg, celiac neg.  +Barretts.  Hyperplastic gastric polyp.  HIP CLOSED REDUCTION Right 09/20/2018  Procedure: CLOSED REDUCTION HIP;  Surgeon: Paralee Cancel, MD;  Location: [facility];  Service: Orthopedics;  Laterality: Right;  REVERSE SHOULDER ARTHROPLASTY Left 06/09/2019  Procedure: REVERSE SHOULDER ARTHROPLASTY;  Surgeon: Netta Cedars, MD;  Location: [facility];  Service: Orthopedics;  Laterality: Left;  SHOULDER SURGERY Right   \  TOTAL HIP ARTHROPLASTY  05/31/2012  Procedure: TOTAL HIP ARTHROPLASTY ANTERIOR APPROACH;  Surgeon: Mauri Pole, MD;  Location: [facility];  Service: Orthopedics;  Laterality: Left;  TOTAL HIP ARTHROPLASTY Right 08/30/2018  Procedure: RIGHT TOTAL HIP ARTHROPLASTY ANTERIOR APPROACH;  Surgeon: Paralee Cancel, MD;  Location: [facility];  Service: Orthopedics;   Laterality: Right;  70 mins HPI: Patient is a 74 y.o. female with PMHx of HLD, hypothyroidism, GERD, lymphocytic colitis, anxiety/depression, chronic back pain on narcotics-presented with several days history of shortness of breath-found to have acute hypoxic respiratory failure secondary to COVID-19 pneumonia.  Patient started to develop some lethargy 9/24, progressive, with no improvement despite holding Xanax and oxycodone, MRI brain significant for acute CVA 9/26 .desat to 83 on oxygen. Per MRI pt has Cervical  spine degeneration with C3-4 and C4-5 anterolisthesis. 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   Subjective: pt awake in bed Assessment / Plan / Recommendation CHL IP CLINICAL IMPRESSIONS 10/02/2020 Clinical Impression MBS revealed moderate oropharyngeal dysphagia as characterized by delayed oral transit, oral holding, premature spillage, late swallow initiation, and instances of penetration/aspiration. Suspect that pt's decreased respiratory status secondary to Covid-19 is negatively impacting her swallow function. Delayed oral transit and oral holding was observed across all POs. Pt demonstrated premature spillage with a cup sip of thin liquid to the level of the pyriforms, which subsequently entered the airway and an immediate cough was observed. Given a straw sip of thin liquid, premature spillage to the level of the pyriforms occurred and trace flash penetration was observed. Despite oral phase impairments, she demonstrated a functional pharyngeal swallow with nectar-thick liquids and puree. Residue was observed in the valleculae following regular solid. Recommend dys 1 diet and nectar-thick liquids. SLP Visit Diagnosis Dysphagia, oropharyngeal phase (R13.12) Attention and concentration deficit following -- Frontal lobe and executive function deficit following -- Impact on safety and function Moderate aspiration risk   CHL IP TREATMENT RECOMMENDATION 10/02/2020 Treatment Recommendations Therapy  as outlined in treatment plan below   Prognosis 10/02/2020 Prognosis for Safe Diet Advancement Fair Barriers to Reach Goals Severity of deficits Barriers/Prognosis Comment -- CHL IP DIET RECOMMENDATION 10/02/2020 SLP Diet Recommendations Dysphagia 1 (Puree) solids;Nectar thick liquid Liquid Administration via Straw Medication Administration Crushed with puree Compensations Slow rate;Small sips/bites Postural Changes Seated upright at 90 degrees   CHL IP OTHER RECOMMENDATIONS 10/02/2020 Recommended Consults -- Oral Care Recommendations Oral care QID Other Recommendations Have oral suction available   CHL IP FOLLOW UP RECOMMENDATIONS 10/02/2020 Follow up Recommendations Inpatient Rehab   CHL IP FREQUENCY AND DURATION 10/02/2020 Speech Therapy Frequency (ACUTE ONLY) min 2x/week Treatment Duration 2 weeks      CHL IP ORAL PHASE 10/02/2020 Oral Phase Impaired Oral - Pudding Teaspoon -- Oral - Pudding Cup -- Oral - Honey Teaspoon -- Oral - Honey Cup -- Oral - Nectar Teaspoon -- Oral - Nectar Cup NT Oral - Nectar Straw Premature spillage;Delayed oral transit;Holding of bolus Oral - Thin Teaspoon -- Oral - Thin Cup Premature spillage;Delayed oral transit;Holding of bolus Oral - Thin Straw Premature spillage;Delayed oral transit;Holding of bolus Oral - Puree Holding of bolus;Delayed oral transit Oral - Mech Soft -- Oral - Regular Delayed oral transit;Holding of bolus Oral - Multi-Consistency -- Oral - Pill -- Oral Phase - Comment --  CHL IP PHARYNGEAL PHASE 10/02/2020 Pharyngeal Phase Impaired Pharyngeal- Pudding Teaspoon -- Pharyngeal -- Pharyngeal- Pudding Cup -- Pharyngeal -- Pharyngeal- Honey Teaspoon -- Pharyngeal -- Pharyngeal- Honey Cup -- Pharyngeal -- Pharyngeal- Nectar Teaspoon -- Pharyngeal -- Pharyngeal- Nectar Cup NT Pharyngeal -- Pharyngeal- Nectar Straw Delayed swallow initiation-vallecula;Delayed swallow initiation-pyriform sinuses Pharyngeal -- Pharyngeal- Thin Teaspoon -- Pharyngeal -- Pharyngeal- Thin Cup Delayed  swallow initiation-vallecula;Delayed swallow initiation-pyriform sinuses;Penetration/Aspiration before swallow;Moderate aspiration Pharyngeal Material enters airway, passes BELOW cords then ejected out Pharyngeal- Thin Straw Delayed swallow initiation-vallecula;Delayed swallow initiation-pyriform sinuses;Trace aspiration Pharyngeal -- Pharyngeal- Puree WFL Pharyngeal -- Pharyngeal- Mechanical Soft -- Pharyngeal -- Pharyngeal- Regular Delayed swallow initiation-vallecula Pharyngeal -- Pharyngeal- Multi-consistency -- Pharyngeal -- Pharyngeal- Pill -- Pharyngeal -- Pharyngeal Comment --  CHL IP CERVICAL ESOPHAGEAL PHASE 09/26/2020 Cervical Esophageal Phase WFL Pudding Teaspoon -- Pudding Cup -- Honey Teaspoon -- Honey Cup -- Nectar Teaspoon -- Nectar Cup -- Nectar Straw -- Thin Teaspoon -- Thin Cup -- Thin Straw -- Puree --  Mechanical Soft -- Regular -- Multi-consistency -- Pill -- Cervical Esophageal Comment -- Herbie Baltimore, MA CCC-SLP Acute Rehabilitation Services Pager [phone] Office [phone] Lynann Beaver 10/02/2020, 3:04 PM              DG Swallowing Func-Speech Pathology  Result Date: 09/26/2020 Objective Swallowing Evaluation: Type of Study: MBS-Modified Barium Swallow Study  Patient Details Name: April Brewer MRN: [id number] Date of Birth: 1946/11/19 Today's Date: 09/26/2020 Time: SLP Start Time (ACUTE ONLY): 3790 -SLP Stop Time (ACUTE ONLY): 2409 SLP Time Calculation (min) (ACUTE ONLY): 17 min Past Medical History: Past Medical History: Diagnosis Date  ALLERGIC RHINITIS 06/02/2007  Allergy   Arthritis   DDD of cervical, thoracic, and lumbar spine  Blood transfusion without reported diagnosis   Cataract   Bilateral removed cateracts  Cervical spondylosis   C5-6 and C6-7.  Pain mgmt as per Dr. Nelva Bush.  Chronic gastritis without bleeding   Chronic low back pain 02/18/2009  s/p fusion L3-4 through L5-S1.  Pain meds per Dr. Nelva Bush  Chronic renal insufficiency, stage 3 (moderate)    borderline II/III (GFR about 55 ml/min) as of 2021  DEPRESSION 06/02/2007  Diverticulosis   GERD (gastroesophageal reflux disease)   Heart murmur   Hiatal hernia   Hypercholesterolemia   HYPOTHYROIDISM 06/02/2007  GOITER  IBS (irritable bowel syndrome)   INSOMNIA 06/02/2007  Lymphocytic colitis   Muscle spasms of lower extremity   Nasal septal perforation   chronic; hx of epistaxis  Normocytic anemia 04/2015  HEME + in ED 08/07/15--endoscopic eval unrevealing.  Iron studies fine-->?Anemia of chronic dz (lymphocytic colitis?).  Hb 02/2020 11.5.  OSTEOPENIA 02/18/2009  Repeat DEXA 07/2014 showed osteoporosis by T score in radius but spine and hip T scores were normal-continue vit D and calcium and repeat DEXA 08/2016 unchanged.  Repeat DEXA 2 yrs.  Peripheral edema   LE's, nonpitting  Renal cyst, acquired, right   1.2 cm, 2021  Simple hepatic cyst   x 1. 2021  Trochanteric bursitis of both hips   inj 08/11/20 Past Surgical History: Past Surgical History: Procedure Laterality Date  ABDOMINAL HYSTERECTOMY    for DUB (ovaries are still in)  BACK SURGERY    X 5: fusion of L3-L4 through L5-S1  CATARACT EXTRACTION Bilateral   CHOLECYSTECTOMY  2012  COLONOSCOPY  06/2007; 01/2015  2008 Diverticulosis, o/w normal.  2016 showed lymphocytic colitis with mild diverticulosis: pt was started on oral budesonide at that time.  CT SCAN  09/03/2017  ABDOMEN, HEAD  DEXA  08/16/2014; 09/14/16  Hip and spine ok; radius osteoporotic---but meds not indicated.  Repeat 08/2018  ESOPHAGOGASTRODUODENOSCOPY  10/2007; 11/27/15; 04/26/20  03/2020: chronic gastritis, H pylori neg, celiac neg.  +Barretts.  Hyperplastic gastric polyp.  HIP CLOSED REDUCTION Right 09/20/2018  Procedure: CLOSED REDUCTION HIP;  Surgeon: Paralee Cancel, MD;  Location: [facility];  Service: Orthopedics;  Laterality: Right;  REVERSE SHOULDER ARTHROPLASTY Left 06/09/2019  Procedure: REVERSE SHOULDER ARTHROPLASTY;  Surgeon: Netta Cedars, MD;  Location: [facility];  Service:  Orthopedics;  Laterality: Left;  SHOULDER SURGERY Right   \  TOTAL HIP ARTHROPLASTY  05/31/2012  Procedure: TOTAL HIP ARTHROPLASTY ANTERIOR APPROACH;  Surgeon: Mauri Pole, MD;  Location: [facility];  Service: Orthopedics;  Laterality: Left;  TOTAL HIP ARTHROPLASTY Right 08/30/2018  Procedure: RIGHT TOTAL HIP ARTHROPLASTY ANTERIOR APPROACH;  Surgeon: Paralee Cancel, MD;  Location: [facility];  Service: Orthopedics;  Laterality: Right;  70 mins HPI: Patient is a 74 y.o. female with PMHx of HLD,  hypothyroidism, GERD, lymphocytic colitis, anxiety/depression, chronic back pain on narcotics-presented with several days history of shortness of breath-found to have acute hypoxic respiratory failure secondary to COVID-19 pneumonia.  Patient started to develop some lethargy 9/24, progressive, with no improvement despite holding Xanax and oxycodone, MRI brain significant for acute CVA 9/26 .desat to 83 on oxygen. 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  Swallow eval ordered.  Subjective: pt awake in bed Assessment / Plan / Recommendation [facility] IP CLINICAL IMPRESSIONS 09/26/2020 Clinical Impression Severe oropharyngeal dysphagia marked by oral holding, lingual residue, inefficient posterior propulsion, late swallow initiation, decreased pharyngeal contraction. Initially able to propel nectar and first trial of puree but subsequent attempts were unsuccessful. Pt held head in an extended postion (diagnosed with cervical spinal degeneration with C3-4 and C4-5 anterolisthesis; dry spoon presentations and verbal cues were not effective. Barium reached her pyriform sinuses for several minutes and was eventually suctioned. Swallow initiated late therefore nectar thick barium dumped to her small pyriform sinus cavities and immediately fell into trachea with immediate cough. Residue in valleculae was max with puree. Recommend continued NPO and ST in attempts to  return to po's. Continue oral care.     SLP Visit Diagnosis Dysphagia, oropharyngeal phase (R13.12) Attention and concentration deficit following -- Frontal lobe and executive function deficit following -- Impact on safety and function Severe aspiration risk   [facility] IP TREATMENT RECOMMENDATION 09/26/2020 Treatment Recommendations Therapy as outlined in treatment plan below   Prognosis 09/26/2020 Prognosis for Safe Diet Advancement Fair Barriers to Reach Goals Severity of deficits Barriers/Prognosis Comment -- [facility] IP DIET RECOMMENDATION 09/26/2020 SLP Diet Recommendations NPO Liquid Administration via -- Medication Administration Via alternative means Compensations -- Postural Changes --   [facility] IP OTHER RECOMMENDATIONS 09/26/2020 Recommended Consults -- Oral Care Recommendations Oral care QID Other Recommendations --   [facility] IP FOLLOW UP RECOMMENDATIONS 09/26/2020 Follow up Recommendations Skilled Nursing facility   [facility] IP FREQUENCY AND DURATION 09/26/2020 Speech Therapy Frequency (ACUTE ONLY) min 2x/week Treatment Duration 2 weeks      [facility] IP ORAL PHASE 09/26/2020 Oral Phase Impaired Oral - Pudding Teaspoon -- Oral - Pudding Cup -- Oral - Honey Teaspoon -- Oral - Honey Cup -- Oral - Nectar Teaspoon -- Oral - Nectar Cup Delayed oral transit;Decreased bolus cohesion;Holding of bolus;Reduced posterior propulsion;Lingual/palatal residue Oral - Nectar Straw -- Oral - Thin Teaspoon -- Oral - Thin Cup -- Oral - Thin Straw -- Oral - Puree Holding of bolus;Delayed oral transit;Weak lingual manipulation;Reduced posterior propulsion;Lingual/palatal residue Oral - Mech Soft -- Oral - Regular -- Oral - Multi-Consistency -- Oral - Pill -- Oral Phase - Comment --  [facility] IP PHARYNGEAL PHASE 09/26/2020 Pharyngeal Phase Impaired Pharyngeal- Pudding Teaspoon -- Pharyngeal -- Pharyngeal- Pudding Cup -- Pharyngeal -- Pharyngeal- Honey Teaspoon -- Pharyngeal -- Pharyngeal- Honey Cup -- Pharyngeal -- Pharyngeal- Nectar Teaspoon -- Pharyngeal --  Pharyngeal- Nectar Cup Delayed swallow initiation-pyriform sinuses;Pharyngeal residue - valleculae;Pharyngeal residue - pyriform Pharyngeal -- Pharyngeal- Nectar Straw -- Pharyngeal -- Pharyngeal- Thin Teaspoon -- Pharyngeal -- Pharyngeal- Thin Cup -- Pharyngeal -- Pharyngeal- Thin Straw -- Pharyngeal -- Pharyngeal- Puree Delayed swallow initiation-vallecula;Reduced pharyngeal peristalsis;Pharyngeal residue - pyriform;Reduced tongue base retraction Pharyngeal -- Pharyngeal- Mechanical Soft -- Pharyngeal -- Pharyngeal- Regular -- Pharyngeal -- Pharyngeal- Multi-consistency -- Pharyngeal -- Pharyngeal- Pill -- Pharyngeal -- Pharyngeal Comment --  [facility] IP CERVICAL ESOPHAGEAL PHASE 09/26/2020 Cervical Esophageal Phase WFL Pudding Teaspoon -- Pudding Cup --  Honey Teaspoon -- Honey Cup -- Nectar Teaspoon -- Nectar Cup -- Nectar Straw -- Thin Teaspoon -- Thin Cup -- Thin Straw -- Puree -- Mechanical Soft -- Regular -- Multi-consistency -- Pill -- Cervical Esophageal Comment -- [place] Siren 09/26/2020, 4:48 PM Orbie Pyo Colvin Caroli.Ed Actor Pager [phone] Office [phone]              ECHOCARDIOGRAM COMPLETE  Result Date: 09/23/2020    ECHOCARDIOGRAM REPORT   Patient Name:   April Brewer Date of Exam: 09/23/2020 Medical Rec #:  [id number]       Height:       64.0 in Accession #:    [id number]      Weight:       145.9 lb Date of Birth:  May 04, 1946       BSA:          67.711 m Patient Age:    48 years        BP:           139/82 mmHg Patient Gender: F               HR:           84 bpm. Exam Location:  Inpatient Procedure: 2D Echo, Cardiac Doppler and Color Doppler Indications:    Stroke 434.91 / I163.9  History:        Patient has no prior history of Echocardiogram examinations.                 Signs/Symptoms:Murmur; Risk Factors:Dyslipidemia. COVID-19                 Positive. CKD. GERD. Hypothyroidism.  Sonographer:    Jonelle Sidle Dance Referring Phys: [id number] [place]  1.  Left ventricular ejection fraction, by estimation, is 65 to 70%. The left ventricle has normal function. The left ventricle has no regional wall motion abnormalities. There is mild concentric left ventricular hypertrophy. Left ventricular diastolic parameters are consistent with Grade I diastolic dysfunction (impaired relaxation).  2. Right ventricular systolic function is normal. The right ventricular size is normal.  3. Left atrial size was mildly dilated.  4. The mitral valve is normal in structure. Mild mitral valve regurgitation. No evidence of mitral stenosis. Moderate mitral annular calcification.  5. The aortic valve is normal in structure. Aortic valve regurgitation is not visualized. No aortic stenosis is present.  6. The inferior vena cava is normal in size with greater than 50% respiratory variability, suggesting right atrial pressure of 3 mmHg. Comparison(s): No prior Echocardiogram. Conclusion(s)/Recommendation(s): No intracardiac source of embolism detected on this transthoracic study. A transesophageal echocardiogram is recommended to exclude cardiac source of embolism if clinically indicated. FINDINGS  Left Ventricle: Left ventricular ejection fraction, by estimation, is 65 to 70%. The left ventricle has normal function. The left ventricle has no regional wall motion abnormalities. The left ventricular internal cavity size was normal in size. There is  mild concentric left ventricular hypertrophy. Left ventricular diastolic parameters are consistent with Grade I diastolic dysfunction (impaired relaxation). Normal left ventricular filling pressure. Right Ventricle: The right ventricular size is normal. No increase in right ventricular wall thickness. Right ventricular systolic function is normal. Left Atrium: Left atrial size was mildly dilated. Right Atrium: Right atrial size was normal in size. Pericardium: There is no evidence of pericardial effusion. Mitral Valve: The mitral valve is normal in  structure. There is mild thickening of the mitral valve leaflet(s). There is  mild calcification of the mitral valve leaflet(s). Moderate mitral annular calcification. Mild mitral valve regurgitation. No evidence of mitral valve stenosis. Tricuspid Valve: The tricuspid valve is normal in structure. Tricuspid valve regurgitation is mild . No evidence of tricuspid stenosis. Aortic Valve: The aortic valve is normal in structure. Aortic valve regurgitation is not visualized. No aortic stenosis is present. Pulmonic Valve: The pulmonic valve was normal in structure. Pulmonic valve regurgitation is not visualized. No evidence of pulmonic stenosis. Aorta: The aortic root is normal in size and structure. Venous: The inferior vena cava is normal in size with greater than 50% respiratory variability, suggesting right atrial pressure of 3 mmHg. IAS/Shunts: No atrial level shunt detected by color flow Doppler.  LEFT VENTRICLE PLAX 2D LVIDd:         3.80 cm  Diastology LVIDs:         2.40 cm  LV e' lateral:   9.46 cm/s LV PW:         1.10 cm  LV E/e' lateral: 8.7 LV IVS:        1.10 cm LVOT diam:     2.10 cm LV SV:         105 LV SV Index:   61 LVOT Area:     3.46 cm  RIGHT VENTRICLE             IVC RV Basal diam:  2.00 cm     IVC diam: 1.20 cm RV S prime:     13.90 cm/s TAPSE (M-mode): 1.5 cm LEFT ATRIUM             Index       RIGHT ATRIUM          Index LA diam:        4.00 cm 2.34 cm/m  RA Area:     8.10 cm LA Vol (A2C):   48.1 ml 28.11 ml/m RA Volume:   13.00 ml 7.60 ml/m LA Vol (A4C):   34.5 ml 20.16 ml/m LA Biplane Vol: 42.3 ml 24.72 ml/m  AORTIC VALVE LVOT Vmax:   148.00 cm/s LVOT Vmean:  98.300 cm/s LVOT VTI:    0.302 m  AORTA Ao Root diam: 3.20 cm Ao Asc diam:  3.10 cm MITRAL VALVE MV Area (PHT): 2.53 cm     SHUNTS MV Decel Time: 300 msec     Systemic VTI:  0.30 m MV E velocity: 81.90 cm/s   Systemic Diam: 2.10 cm MV A velocity: 133.00 cm/s MV E/A ratio:  0.62 Ena Dawley MD Electronically signed by Ena Dawley MD Signature Date/Time: 09/23/2020/5:19:20 PM    Final     Microbiology: No results found for this or any previous visit (from the past 240 hour(s)).   Labs: Basic Metabolic Panel: Recent Labs  Lab 10/02/20 0440 10/02/20 0440 10/03/20 0246 10/03/20 0246 10/04/20 0541 2020-10-19 1407  NA 133*  --  131*  --  133* 134*  K 4.6   < > 4.5   < > 4.9 4.7  CL 99  --  97*  --  97* 98  CO2 24  --  24  --  24 25  GLUCOSE 98  --  127*  --  119* 125*  BUN 16  --  19  --  15 21  CREATININE 0.67  --  0.63  --  0.75 0.71  CALCIUM 8.8*  --  8.7*  --  9.2 9.4   < > = values in this interval not  displayed.   Liver Function Tests: Recent Labs  Lab 11/03/2020 1407  AST 40  ALT 68*  ALKPHOS 82  BILITOT 0.3  PROT 7.1  ALBUMIN 2.5*   No results for input(s): LIPASE, AMYLASE in the last 168 hours. No results for input(s): AMMONIA in the last 168 hours. CBC: Recent Labs  Lab 10/04/20 0541 11-03-20 1407  WBC 14.0* 17.1*  HGB 12.2 11.9*  HCT 37.6 37.4  MCV 91.7 93.3  PLT 248 286   Cardiac Enzymes: No results for input(s): CKTOTAL, CKMB, CKMBINDEX, TROPONINI in the last 168 hours. D-Dimer No results for input(s): DDIMER in the last 72 hours. BNP: Invalid input(s): POCBNP CBG: Recent Labs  Lab 10/06/20 1631 10/06/20 2025 03-Nov-2020 0645 November 03, 2020 1121 03-Nov-2020 1552  GLUCAP 112* 168* 175* 146* 162*   Anemia work up No results for input(s): VITAMINB12, FOLATE, FERRITIN, TIBC, IRON, RETICCTPCT in the last 72 hours. Urinalysis    Component Value Date/Time   COLORURINE YELLOW 09/21/2020 1453   APPEARANCEUR CLEAR 09/21/2020 1453   LABSPEC 1.017 09/21/2020 1453   PHURINE 6.0 09/21/2020 1453   GLUCOSEU NEGATIVE 09/21/2020 1453   HGBUR NEGATIVE 09/21/2020 1453   BILIRUBINUR NEGATIVE 09/21/2020 1453   BILIRUBINUR negative 03/18/2017 0924   KETONESUR 5 (A) 09/21/2020 1453   PROTEINUR NEGATIVE 09/21/2020 1453   UROBILINOGEN 0.2 03/18/2017 0924   UROBILINOGEN 0.2 02/03/2015 0255     NITRITE NEGATIVE 09/21/2020 1453   LEUKOCYTESUR NEGATIVE 09/21/2020 1453   Sepsis Labs Invalid input(s): PROCALCITONIN,  WBC,  LACTICIDVEN     SIGNED:  Georgette Shell, MD  [facility] 10/08/2020, 3:38 PM

## 2020-10-28 NOTE — Significant Event (Signed)
Rapid Response Event Note   Reason for Call :  Respiratory distress, placed on NRB  Initial Focused Assessment:  Patient in acute respiratory distress, using all accessory muscles. She is able to say a brief word. RR 48-56 Lung sounds with crackles Heart tones regular She is airhungry, very restless  BP 131/90  HR 140-150s  RR 48-56  O2 sat 80% on NRB Dr Zigmund Daniel at bedside to assess patient  Interventions:  ABG PCXR 0.5mg  Ativan and 1mg  Morphine with no improvement Bipap, with no improvement  RR 48-52  O2 sats 79-82% 2mg  morphine given IV  Dr Zigmund Daniel spoke with son.  Transition to comfort Bipap D/C'd 2mg  Ativan givne IV  She is more sedated but still breathing about 50 per min Using all accessory muscles   Son at bedside  1700: RR 45-50  Still using accessory muscle but a little better.  Additional 1 mg Morphine given IV   Plan of Care:  Comfort care  Event Summary:   MD Notified: Dr Zigmund Daniel Call Time: 1115 [place] Time: 1528 End Time: 5208  Raliegh Ip, RN

## 2020-10-28 DEATH — deceased

## 2021-11-06 IMAGING — CT CT ANGIO HEAD
2 of 3 series · 15 of 34 positions shown · IV contrast (omnipaque)
Comparison: MRI 09/22/2020

CLINICAL DATA: Posterior circulation stroke.  COVID positive.

EXAM:
CT ANGIOGRAPHY HEAD AND NECK
TECHNIQUE: Multidetector CT imaging of the head and neck was performed using
the standard protocol during bolus administration of intravenous
contrast. Multiplanar CT image reconstructions and MIPs were
obtained to evaluate the vascular anatomy. Carotid stenosis
measurements (when applicable) are obtained utilizing NASCET
criteria, using the distal internal carotid diameter as the
denominator.
CONTRAST:  75mL OMNIPAQUE IOHEXOL 350 MG/ML SOLN

[Series 3: coronal · axial · 0.43mm/px · z∈[-142,-32]mm · 12 of 27 slices shown]
[im 3/27  soft-tissue]
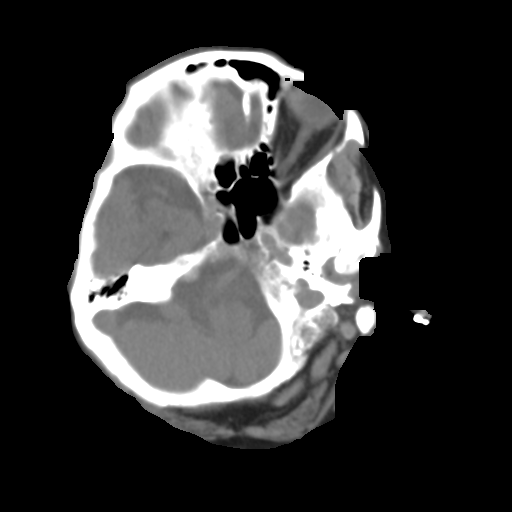
[im 5/27  bone]
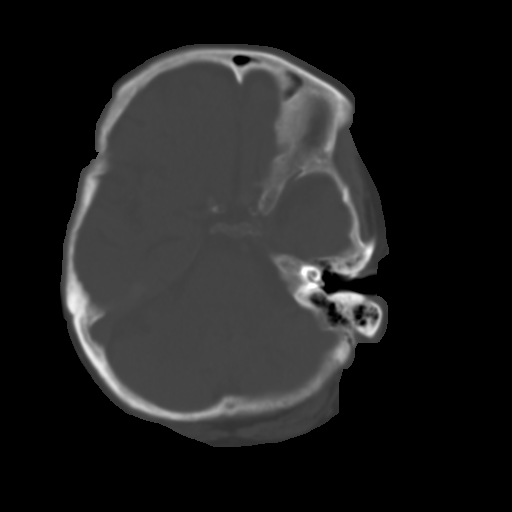
[im 7/27  soft-tissue]
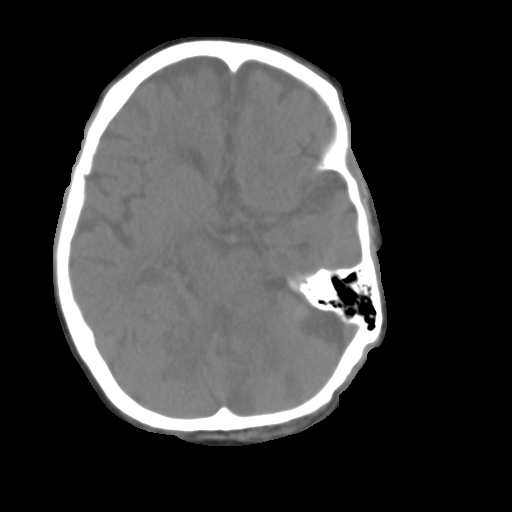
[im 9/27  bone]
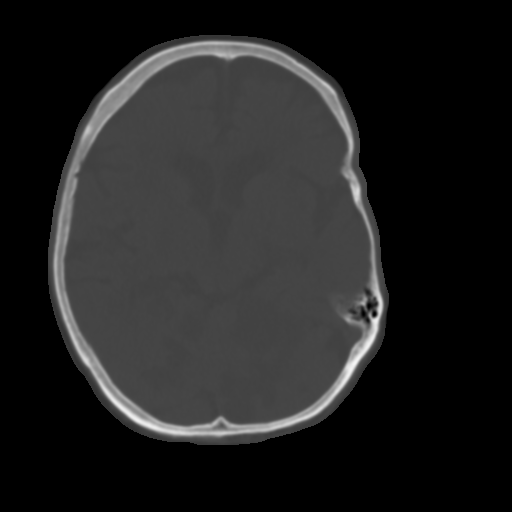
[im 11/27  soft-tissue]
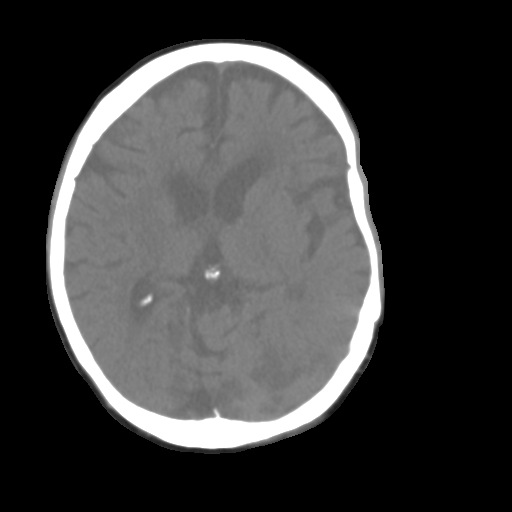
[im 13/27  bone]
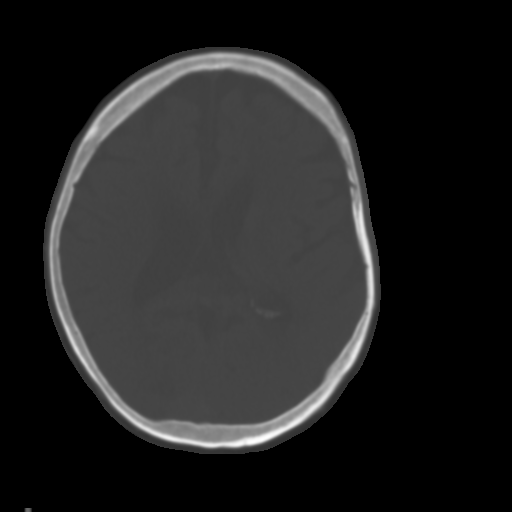
[im 15/27  soft-tissue]
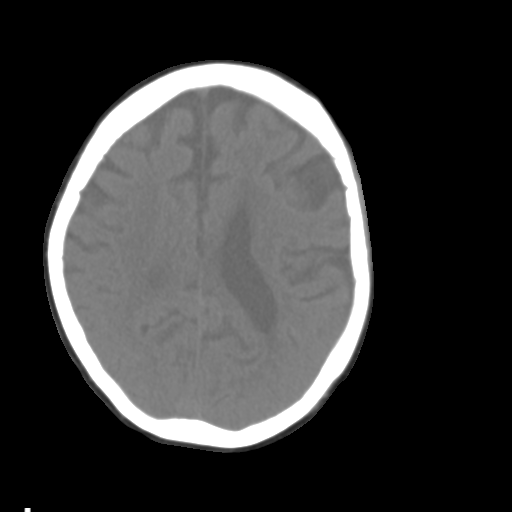
[im 17/27  bone]
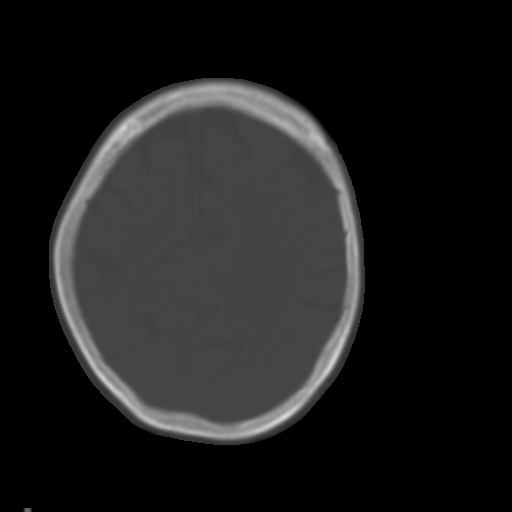
[im 19/27  soft-tissue]
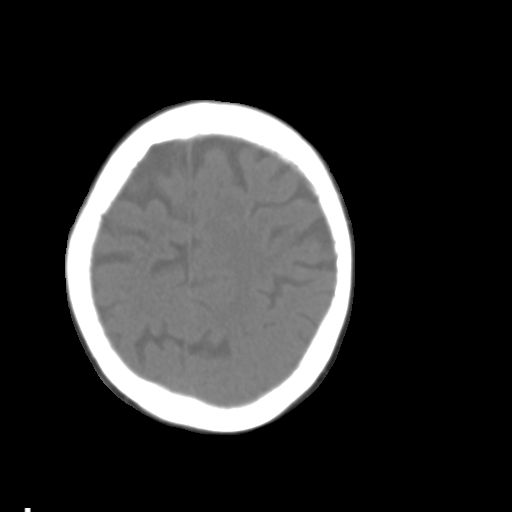
[im 21/27  bone]
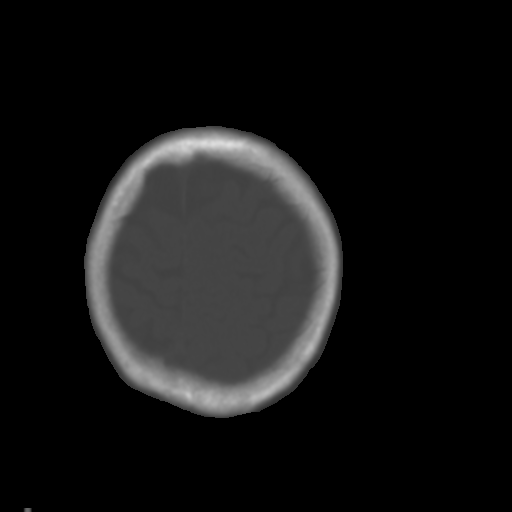
[im 23/27  soft-tissue]
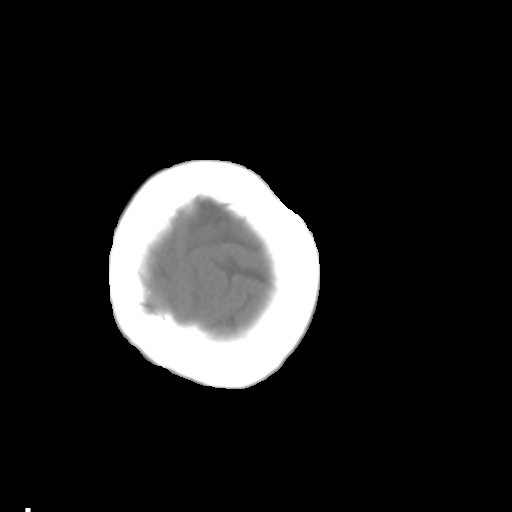
[im 25/27  bone]
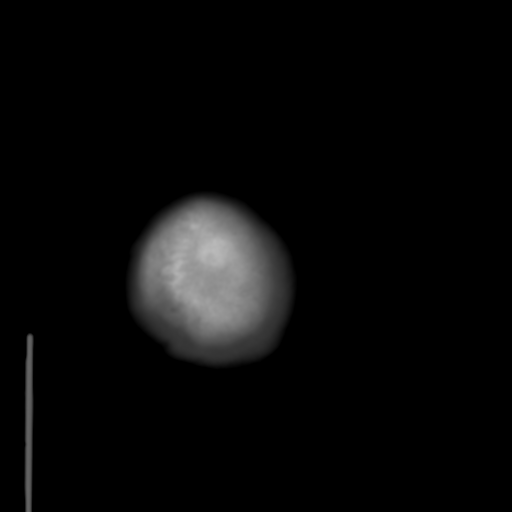

[Series 7: sagittal · sagittal · 0.30mm/px · 3 of 41 slices shown]
[im 18/41  soft-tissue]
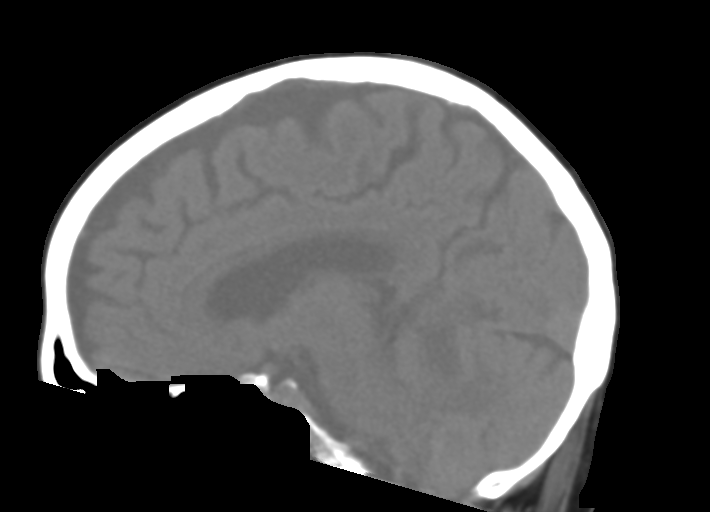
[im 21/41  soft-tissue]
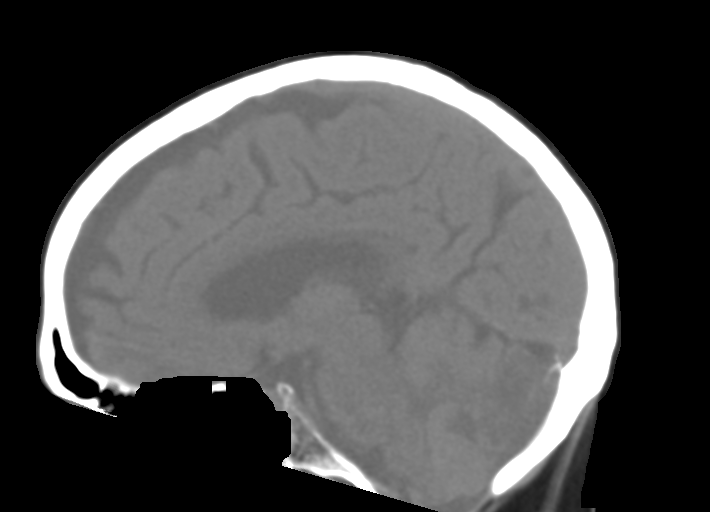
[im 24/41  soft-tissue]
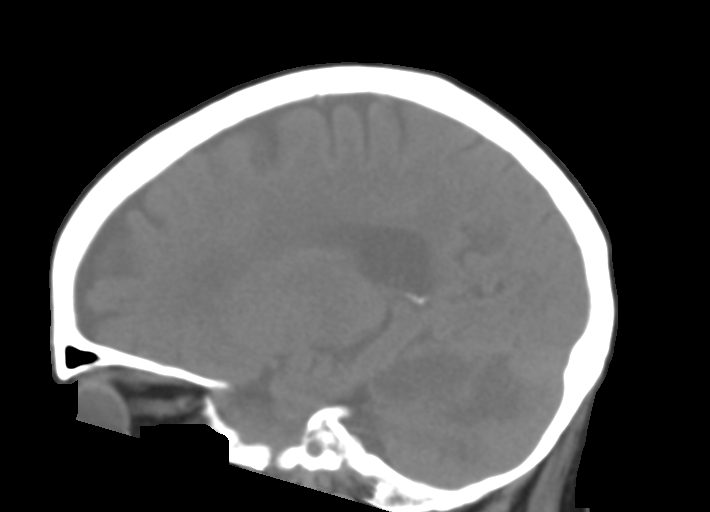

[15 of 34 positions shown; findings below may reference images not displayed]

FINDINGS: CTA NECK FINDINGS

Aortic arch: Visualized portions demonstrate atherosclerosis without
significant stenosis or aneurysm.

Right carotid system: No evidence of dissection, stenosis (50% or
greater) or occlusion.

Left carotid system: No evidence of dissection, stenosis (50% or
greater) or occlusion.

Vertebral arteries: Right dominant. There is abrupt occlusion of the
left vertebral artery at the level of C2. Reconstitution immediately
before the vertebral artery courses intradural E, which may be from
retrograde flow and/or collaterals.

Skeleton: Multilevel severe degenerative disc disease in the
cervical spine.

Other neck: No mass lesion or adenopathy.

Upper chest: Peripheral predominant ground-glass opacities in the
imaged lung apices, compatible with COVID pneumonia.

Review of the MIP images confirms the above findings

CTA HEAD FINDINGS

Anterior circulation: No significant stenosis, proximal occlusion,
aneurysm, or vascular malformation.

Posterior circulation: Abrupt inclusion of the distal right P2 PCA
(series 5, image 448). a

Venous sinuses: As permitted by contrast timing, patent.

Additional comments: Partially imaged edema associated with
multifocal posterior circulation infarcts that were better
characterized on recent MRI.

Review of the MIP images confirms the above findings
IMPRESSION: 1. Abrupt occlusion of the left vertebral artery at the level of C2
and the distal right P2 PCA, as above.
2. Partially imaged edema associated with multifocal posterior
circulation infarcts that were better characterized on recent MRI.
Non-contrast head CT could further evaluate the degree of edema/mass
effect if clinically indicated.
3. Peripheral predominant ground-glass opacities in the imaged lung
apices, compatible with COVID pneumonia given the clinical history.

These results will be called to the ordering clinician or
representative by the Radiologist Assistant, and communication
documented in the PACS or [REDACTED].
